# Patient Record
Sex: Male | Born: 1960 | Race: White | Hispanic: No | Marital: Married | Smoking: Never smoker
Health system: Southern US, Community
[De-identification: ages and names within clinical notes are randomized; demographics above are authoritative.]

## PROBLEM LIST (undated history)

## (undated) DIAGNOSIS — I10 Essential (primary) hypertension: Secondary | ICD-10-CM

## (undated) DIAGNOSIS — D649 Anemia, unspecified: Secondary | ICD-10-CM

## (undated) DIAGNOSIS — F418 Other specified anxiety disorders: Secondary | ICD-10-CM

## (undated) DIAGNOSIS — N200 Calculus of kidney: Secondary | ICD-10-CM

## (undated) DIAGNOSIS — R7989 Other specified abnormal findings of blood chemistry: Secondary | ICD-10-CM

## (undated) DIAGNOSIS — Z87442 Personal history of urinary calculi: Secondary | ICD-10-CM

## (undated) HISTORY — PX: LITHOTRIPSY: SUR834

## (undated) HISTORY — DX: Calculus of kidney: N20.0

## (undated) HISTORY — DX: Essential (primary) hypertension: I10

## (undated) HISTORY — PX: EYE SURGERY: SHX253

## (undated) HISTORY — PX: OTHER SURGICAL HISTORY: SHX169

## (undated) HISTORY — DX: Other specified abnormal findings of blood chemistry: R79.89

## (undated) HISTORY — DX: Other specified anxiety disorders: F41.8

---
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
## 2008-07-19 ENCOUNTER — Ambulatory Visit: Payer: Self-pay | Admitting: Cardiology

## 2010-08-20 ENCOUNTER — Ambulatory Visit (HOSPITAL_BASED_OUTPATIENT_CLINIC_OR_DEPARTMENT_OTHER): Admission: RE | Admit: 2010-08-20 | Discharge: 2010-08-20 | Payer: Self-pay | Admitting: Urology

## 2010-12-18 LAB — POCT I-STAT 4, (NA,K, GLUC, HGB,HCT)
HCT: 43 % (ref 39.0–52.0)
Hemoglobin: 14.6 g/dL (ref 13.0–17.0)
Potassium: 4 mEq/L (ref 3.5–5.1)
Sodium: 140 mEq/L (ref 135–145)

## 2013-01-21 ENCOUNTER — Other Ambulatory Visit: Payer: Self-pay

## 2013-01-21 MED ORDER — AMLODIPINE BESY-BENAZEPRIL HCL 5-20 MG PO CAPS: 1.0000 | ORAL_CAPSULE | Freq: Every day | ORAL | Status: DC

## 2013-01-25 ENCOUNTER — Encounter: Payer: Self-pay | Admitting: *Deleted

## 2013-02-01 ENCOUNTER — Encounter: Payer: Self-pay | Admitting: Family Medicine

## 2013-02-01 ENCOUNTER — Ambulatory Visit (INDEPENDENT_AMBULATORY_CARE_PROVIDER_SITE_OTHER): Payer: 59 | Admitting: Family Medicine

## 2013-02-01 VITALS — BP 139/86 | HR 96 | Temp 98.5°F | Ht 70.0 in | Wt 178.5 lb

## 2013-02-01 DIAGNOSIS — R3589 Other polyuria: Secondary | ICD-10-CM | POA: Insufficient documentation

## 2013-02-01 DIAGNOSIS — N529 Male erectile dysfunction, unspecified: Secondary | ICD-10-CM

## 2013-02-01 DIAGNOSIS — E785 Hyperlipidemia, unspecified: Secondary | ICD-10-CM | POA: Insufficient documentation

## 2013-02-01 DIAGNOSIS — I10 Essential (primary) hypertension: Secondary | ICD-10-CM | POA: Insufficient documentation

## 2013-02-01 DIAGNOSIS — N200 Calculus of kidney: Secondary | ICD-10-CM | POA: Insufficient documentation

## 2013-02-01 DIAGNOSIS — R739 Hyperglycemia, unspecified: Secondary | ICD-10-CM | POA: Insufficient documentation

## 2013-02-01 DIAGNOSIS — E559 Vitamin D deficiency, unspecified: Secondary | ICD-10-CM

## 2013-02-01 DIAGNOSIS — R358 Other polyuria: Secondary | ICD-10-CM | POA: Insufficient documentation

## 2013-02-01 DIAGNOSIS — R7309 Other abnormal glucose: Secondary | ICD-10-CM

## 2013-02-01 LAB — POCT URINALYSIS DIPSTICK
Bilirubin, UA: NEGATIVE
Glucose, UA: NEGATIVE
Ketones, UA: NEGATIVE
Leukocytes, UA: NEGATIVE
Nitrite, UA: NEGATIVE
Protein, UA: NEGATIVE
Spec Grav, UA: 1.02
Urobilinogen, UA: NEGATIVE
pH, UA: 7

## 2013-02-01 LAB — TSH: TSH: 1.142 u[IU]/mL (ref 0.350–4.500)

## 2013-02-01 LAB — COMPLETE METABOLIC PANEL WITH GFR
ALT: 13 U/L (ref 0–53)
AST: 12 U/L (ref 0–37)
Albumin: 4.6 g/dL (ref 3.5–5.2)
Alkaline Phosphatase: 79 U/L (ref 39–117)
BUN: 17 mg/dL (ref 6–23)
CO2: 30 mEq/L (ref 19–32)
Calcium: 9.9 mg/dL (ref 8.4–10.5)
Chloride: 102 mEq/L (ref 96–112)
Creat: 0.88 mg/dL (ref 0.50–1.35)
GFR, Est African American: >89 mL/min
GFR, Est Non African American: >89 mL/min
Glucose, Bld: 108 mg/dL — ABNORMAL HIGH (ref 70–99)
Potassium: 4.3 mEq/L (ref 3.5–5.3)
Sodium: 141 mEq/L (ref 135–145)
Total Bilirubin: 0.4 mg/dL (ref 0.3–1.2)
Total Protein: 7.6 g/dL (ref 6.0–8.3)

## 2013-02-01 LAB — POCT UA - MICROSCOPIC ONLY
Bacteria, U Microscopic: NEGATIVE
Crystals, Ur, HPF, POC: NEGATIVE
Epithelial cells, urine per micros: NEGATIVE
WBC, Ur, HPF, POC: NEGATIVE
Yeast, UA: NEGATIVE

## 2013-02-01 LAB — PROLACTIN: Prolactin: 5.4 ng/mL (ref 2.1–17.1)

## 2013-02-01 LAB — POCT GLYCOSYLATED HEMOGLOBIN (HGB A1C): Hemoglobin A1C: 5.1

## 2013-02-01 MED ORDER — AMLODIPINE BESY-BENAZEPRIL HCL 5-40 MG PO CAPS: 1.0000 | ORAL_CAPSULE | Freq: Every day | ORAL | Status: DC

## 2013-02-01 MED ORDER — SILDENAFIL CITRATE 50 MG PO TABS: 50.0000 mg | ORAL_TABLET | Freq: Every day | ORAL | Status: DC | PRN

## 2013-02-01 NOTE — Progress Notes (Signed)
Patient ID: [name], male   DOB: Feb 25, 1961, 52 y.o.   MRN: [id number] SUBJECTIVE: HPI: 52 year old male hear for follow up. Has weakness of the stream to urinate.  Patient is here for follow up of hypertension: denies Headache;deniesChest Pain;denies weakness;denies Shortness of Breath or Orthopnea;denies Visual changes;denies palpitations;admits to cough;denies pedal edema;denies symptoms of TIA or stroke; admits to Compliance with medications. denies Problems with medications.  Also, having problems with erection.  Past Medical History  Diagnosis Date  . Hypertension   . Kidney stones    No past surgical history on file. Current Outpatient Prescriptions on File Prior to Visit  Medication Sig Dispense Refill  . amLODipine-benazepril (LOTREL) 5-20 MG per capsule Take 1 capsule by mouth daily.  30 capsule  1   No current facility-administered medications on file prior to visit.   No Known Allergies Immunization History  Administered Date(s) Administered  . Tdap 04/12/2010      PMH/PSH: reviewed/updated in Epic  SH/FH: reviewed/updated in Epic  Allergies: reviewed/updated in Epic  Medications: reviewed/updated in Epic  Immunizations: reviewed/updated in Epic  ROS: As above in the HPI. All other systems are stable or negative.  OBJECTIVE: APPEARANCE:  Slim healthy appearing White male.  Patient in no acute distress.The patient appeared well nourished and normally developed. Acyanotic. Waist: VITAL SIGNS:BP 139/86  Pulse 96  Temp(Src) 98.5 F (36.9 C) (Oral)  Ht 5\' 10"  (1.778 m)  Wt 178 lb 8 oz (80.967 kg)  BMI 25.61 kg/m2   SKIN: warm and  Dry without overt rashes, tattoos and scars  HEAD and Neck: without JVD, Head and scalp: normal Eyes:No scleral icterus. Fundi normal, eye movements normal. Ears: Auricle normal, canal normal, Tympanic membranes normal, insufflation normal. Nose: normal Throat: normal Neck & thyroid: normal  CHEST &  LUNGS: Chest wall: normal Lungs: Clear  CVS: Reveals the PMI to be normally located. Regular rhythm, First and Second Heart sounds are normal,  absence of murmurs, rubs or gallops. Peripheral vasculature: Radial pulses: normal Dorsal pedis pulses: normal Posterior pulses: normal  ABDOMEN:  Appearance: normal Benign,, no organomegaly, no masses, no Abdominal Aortic enlargement. No Guarding , no rebound. No Bruits. Bowel sounds: normal  RECTAL: N/A GU: N/A  EXTREMETIES: nonedematous. Both Femoral and Pedal pulses are normal.  MUSCULOSKELETAL:  Spine: normal Joints: intact  NEUROLOGIC: oriented to time,place and person; nonfocal. Strength is normal Sensory is normal Reflexes are normal Cranial Nerves are normal.  ASSESSMENT: HTN (hypertension) - Plan: COMPLETE METABOLIC PANEL WITH GFR, amLODipine-benazepril (LOTREL) 5-40 MG per capsule  Kidney stones - Plan: COMPLETE METABOLIC PANEL WITH GFR  Erectile dysfunction - Plan: COMPLETE METABOLIC PANEL WITH GFR, Testosterone, Total & Free Direct, Prolactin, TSH, sildenafil (VIAGRA) 50 MG tablet  Hyperglycemia - Plan: COMPLETE METABOLIC PANEL WITH GFR, POCT urinalysis dipstick, POCT glycosylated hemoglobin (Hb A1C)  Dyslipidemia - Plan: COMPLETE METABOLIC PANEL WITH GFR, NMR Lipoprofile with Lipids  Unspecified vitamin D deficiency - Plan: Vitamin D 25 hydroxy  Polyuria - Plan: POCT urinalysis dipstick, Urine culture, POCT glycosylated hemoglobin (Hb A1C)    PLAN: Orders Placed This Encounter  Procedures  . Urine culture  . COMPLETE METABOLIC PANEL WITH GFR  . Vitamin D 25 hydroxy  . NMR Lipoprofile with Lipids  . Testosterone, Total & Free Direct  . Prolactin  . TSH  . POCT urinalysis dipstick  . POCT glycosylated hemoglobin (Hb A1C)   No results found for this or any previous visit (from the past 24 hour(s)). Meds  ordered this encounter  Medications  . amLODipine-benazepril (LOTREL) 5-40 MG per capsule     Sig: Take 1 capsule by mouth daily.    Dispense:  30 capsule    Refill:  5  . sildenafil (VIAGRA) 50 MG tablet    Sig: Take 1 tablet (50 mg total) by mouth daily as needed for erectile dysfunction.    Dispense:  10 tablet    Refill:  5   Adjusted his BP meds to bring under better control.  Discussed his GU symptoms, which is more ED than polyuria.  RTc 6 weeks.  Dietary changes recommended.  Syvanna Ciolino P. Modesto Charon, M.D.

## 2013-02-01 NOTE — Addendum Note (Signed)
Addended by: Roselyn Reef on: 02/01/2013 09:39 AM   Modules accepted: Orders

## 2013-02-02 ENCOUNTER — Ambulatory Visit: Payer: Self-pay | Admitting: Family Medicine

## 2013-02-02 LAB — NMR LIPOPROFILE WITH LIPIDS
Cholesterol, Total: 167 mg/dL (ref <200)
HDL Particle Number: 32.3 umol/L (ref >=30.5)
HDL Size: 8.5 nm — ABNORMAL LOW (ref >=9.2)
HDL-C: 47 mg/dL (ref >=40)
LDL (calc): 78 mg/dL (ref <100)
LDL Particle Number: 1415 nmol/L — ABNORMAL HIGH (ref <1000)
LDL Size: 20.6 nm (ref >20.5)
LP-IR Score: 76 — ABNORMAL HIGH (ref <=45)
Large HDL-P: 2.2 umol/L — ABNORMAL LOW (ref >=4.8)
Large VLDL-P: 7.7 nmol/L — ABNORMAL HIGH (ref <=2.7)
Small LDL Particle Number: 904 nmol/L — ABNORMAL HIGH (ref <=527)
Triglycerides: 208 mg/dL — ABNORMAL HIGH (ref <150)
VLDL Size: 50.3 nm — ABNORMAL HIGH (ref <=46.6)

## 2013-02-02 LAB — URINE CULTURE
Colony Count: NO GROWTH
Organism ID, Bacteria: NO GROWTH

## 2013-02-02 LAB — VITAMIN D 25 HYDROXY (VIT D DEFICIENCY, FRACTURES): Vit D, 25-Hydroxy: 26 ng/mL — ABNORMAL LOW (ref 30–89)

## 2013-02-03 LAB — TESTOSTERONE, TOTAL AND FREE DIRECT MEASURE
Free Testosterone, Direct: 7.1 pg/mL (ref 3.8–34.2)
Testosterone: 228 ng/dL — ABNORMAL LOW (ref 300–890)

## 2013-02-09 ENCOUNTER — Telehealth: Payer: Self-pay | Admitting: Family Medicine

## 2013-02-09 NOTE — Telephone Encounter (Signed)
Left mess on pt VM cell phone that rx has been called to [facility]

## 2013-03-16 ENCOUNTER — Ambulatory Visit: Payer: 59 | Admitting: Family Medicine

## 2013-06-01 ENCOUNTER — Ambulatory Visit: Payer: 59 | Admitting: Family Medicine

## 2013-07-20 ENCOUNTER — Ambulatory Visit: Payer: 59 | Admitting: Family Medicine

## 2013-07-22 ENCOUNTER — Other Ambulatory Visit: Payer: Self-pay | Admitting: *Deleted

## 2013-07-22 DIAGNOSIS — I10 Essential (primary) hypertension: Secondary | ICD-10-CM

## 2013-07-22 MED ORDER — AMLODIPINE BESY-BENAZEPRIL HCL 5-40 MG PO CAPS: 1.0000 | ORAL_CAPSULE | Freq: Every day | ORAL | Status: DC

## 2013-07-22 NOTE — Telephone Encounter (Signed)
Patient needs to be seen. Has exceeded time since last visit. Limited quantity refilled. Needs to bring all medications to next appointment.   

## 2013-07-22 NOTE — Telephone Encounter (Signed)
LAST OV 02/01/13.

## 2013-07-26 ENCOUNTER — Other Ambulatory Visit: Payer: Self-pay

## 2013-07-26 DIAGNOSIS — I10 Essential (primary) hypertension: Secondary | ICD-10-CM

## 2013-07-26 MED ORDER — AMLODIPINE BESY-BENAZEPRIL HCL 5-40 MG PO CAPS: 1.0000 | ORAL_CAPSULE | Freq: Every day | ORAL | Status: DC

## 2013-08-30 ENCOUNTER — Encounter: Payer: Self-pay | Admitting: *Deleted

## 2013-09-16 ENCOUNTER — Ambulatory Visit: Payer: 59 | Admitting: Family Medicine

## 2013-09-20 ENCOUNTER — Encounter: Payer: Self-pay | Admitting: Family Medicine

## 2013-09-20 ENCOUNTER — Ambulatory Visit (INDEPENDENT_AMBULATORY_CARE_PROVIDER_SITE_OTHER): Payer: 59

## 2013-09-20 ENCOUNTER — Ambulatory Visit (INDEPENDENT_AMBULATORY_CARE_PROVIDER_SITE_OTHER): Payer: 59 | Admitting: Family Medicine

## 2013-09-20 VITALS — BP 130/87 | HR 109 | Temp 97.6°F | Ht 70.0 in | Wt 173.0 lb

## 2013-09-20 DIAGNOSIS — E291 Testicular hypofunction: Secondary | ICD-10-CM

## 2013-09-20 DIAGNOSIS — M25412 Effusion, left shoulder: Secondary | ICD-10-CM

## 2013-09-20 DIAGNOSIS — E785 Hyperlipidemia, unspecified: Secondary | ICD-10-CM

## 2013-09-20 DIAGNOSIS — R358 Other polyuria: Secondary | ICD-10-CM

## 2013-09-20 DIAGNOSIS — E559 Vitamin D deficiency, unspecified: Secondary | ICD-10-CM

## 2013-09-20 DIAGNOSIS — M25419 Effusion, unspecified shoulder: Secondary | ICD-10-CM

## 2013-09-20 DIAGNOSIS — N200 Calculus of kidney: Secondary | ICD-10-CM

## 2013-09-20 DIAGNOSIS — R350 Frequency of micturition: Secondary | ICD-10-CM

## 2013-09-20 DIAGNOSIS — N529 Male erectile dysfunction, unspecified: Secondary | ICD-10-CM

## 2013-09-20 DIAGNOSIS — I10 Essential (primary) hypertension: Secondary | ICD-10-CM

## 2013-09-20 DIAGNOSIS — R7309 Other abnormal glucose: Secondary | ICD-10-CM

## 2013-09-20 DIAGNOSIS — R739 Hyperglycemia, unspecified: Secondary | ICD-10-CM

## 2013-09-20 DIAGNOSIS — R7989 Other specified abnormal findings of blood chemistry: Secondary | ICD-10-CM | POA: Insufficient documentation

## 2013-09-20 DIAGNOSIS — R3589 Other polyuria: Secondary | ICD-10-CM

## 2013-09-20 LAB — POCT URINALYSIS DIPSTICK
Bilirubin, UA: NEGATIVE
Glucose, UA: NEGATIVE
Ketones, UA: NEGATIVE
Leukocytes, UA: NEGATIVE
Nitrite, UA: NEGATIVE
Protein, UA: NEGATIVE
Spec Grav, UA: >=1.03
Urobilinogen, UA: NEGATIVE
pH, UA: 5

## 2013-09-20 LAB — POCT UA - MICROSCOPIC ONLY
Casts, Ur, LPF, POC: NEGATIVE
Crystals, Ur, HPF, POC: NEGATIVE
Mucus, UA: NEGATIVE
RBC, urine, microscopic: 3.5
Yeast, UA: NEGATIVE

## 2013-09-20 MED ORDER — AMLODIPINE BESY-BENAZEPRIL HCL 5-40 MG PO CAPS: 1.0000 | ORAL_CAPSULE | Freq: Every day | ORAL | Status: DC

## 2013-09-20 NOTE — Progress Notes (Signed)
Patient ID: [name], male   DOB: 03-14-1961, 52 y.o.   MRN: [id number] SUBJECTIVE: CC: Chief Complaint  Patient presents with  . Follow-up    follow up on chronic medical condition.  . Urinary Frequency    urinary frequency. Hx of kidney stones. Denies dysuria or hematuria.   . Medication Refill    Lotrel    HPI: Lower tract symptoms of  Frequency, weaker urinary stream,. No pain no straining.  Patient is here for follow up of hypertension/Hyperlipidemia: denies Headache;deniesChest Pain;denies weakness;denies Shortness of Breath or Orthopnea;denies Visual changes;denies palpitations;denies cough;denies pedal edema;denies symptoms of TIA or stroke; admits to Compliance with medications. denies Problems with medications.  Vitamin D Def  Low Testosterone.  Has h/o kidney stones.  Has concern for prostate cancer since his dad had this in his older age.   Past Medical History  Diagnosis Date  . Hypertension   . Kidney stones   . Low testosterone    History reviewed. No pertinent past surgical history. History   Social History  . Marital Status: Married    Spouse Name: N/A    Number of Children: N/A  . Years of Education: N/A   Occupational History  . Not on file.   Social History Main Topics  . Smoking status: Never Smoker   . Smokeless tobacco: Not on file  . Alcohol Use: No  . Drug Use: No  . Sexual Activity: Yes   Other Topics Concern  . Not on file   Social History Narrative  . No narrative on file   Family History  Problem Relation Age of Onset  . Hypertension Mother   . Cancer Father     prostate  . Hypertension Father    Current Outpatient Prescriptions on File Prior to Visit  Medication Sig Dispense Refill  . sildenafil (VIAGRA) 50 MG tablet Take 1 tablet (50 mg total) by mouth daily as needed for erectile dysfunction.  10 tablet  5   No current facility-administered medications on file prior to visit.   No Known  Allergies Immunization History  Administered Date(s) Administered  . Tdap 04/12/2010   Prior to Admission medications   Medication Sig Start Date End Date Taking? Authorizing Provider  amLODipine-benazepril (LOTREL) 5-40 MG per capsule Take 1 capsule by mouth daily. 07/26/13   Ernestina Penna, MD  sildenafil (VIAGRA) 50 MG tablet Take 1 tablet (50 mg total) by mouth daily as needed for erectile dysfunction. 02/01/13   Ileana Ladd, MD     ROS: As above in the HPI. All other systems are stable or negative.  OBJECTIVE: APPEARANCE:  Patient in no acute distress.The patient appeared well nourished and normally developed. Acyanotic. Waist: VITAL SIGNS:BP 130/87  Pulse 109  Temp(Src) 97.6 F (36.4 C) (Oral)  Ht 5\' 10"  (1.778 m)  Wt 173 lb (78.472 kg)  BMI 24.82 kg/m2  WM SKIN: warm and  Dry without overt rashes, tattoos and scars  HEAD and Neck: without JVD, Head and scalp: normal Eyes:No scleral icterus. Fundi normal, eye movements normal. Ears: Auricle normal, canal normal, Tympanic membranes normal, insufflation normal. Nose: normal Throat: normal Neck & thyroid: normal  CHEST & LUNGS: Chest wall: normal Lungs: Clear  CVS: Reveals the PMI to be normally located. Regular rhythm, First and Second Heart sounds are normal,  absence of murmurs, rubs or gallops. Peripheral vasculature: Radial pulses: normal Dorsal pedis pulses: normal Posterior pulses: normal  ABDOMEN:  Appearance: normal Benign, no organomegaly, no masses, no  Abdominal Aortic enlargement. No Guarding , no rebound. No Bruits. Bowel sounds: normal  RECTAL: prostate moderate enlargement, smooth, hemenegative brown stools  GU: N/A  EXTREMETIES: nonedematous.  MUSCULOSKELETAL:  Spine: normal Joints: intact  NEUROLOGIC: oriented to time,place and person; nonfocal. Strength is normal Sensory is normal Reflexes are normal Cranial Nerves are normal.   Results for orders placed in visit on  09/20/13  POCT UA - MICROSCOPIC ONLY      Result Value Range   WBC, Ur, HPF, POC 1-3     RBC, urine, microscopic 3.5     Bacteria, U Microscopic occ     Mucus, UA negative     Epithelial cells, urine per micros occ     Crystals, Ur, HPF, POC negative     Casts, Ur, LPF, POC negative     Yeast, UA negative    POCT URINALYSIS DIPSTICK      Result Value Range   Color, UA gold     Clarity, UA clear     Glucose, UA negative     Bilirubin, UA negative     Ketones, UA negative     Spec Grav, UA >=1.030     Blood, UA trace     pH, UA 5.0     Protein, UA negative     Urobilinogen, UA negative     Nitrite, UA negative     Leukocytes, UA Negative      ASSESSMENT: Urinary frequency - Plan: POCT UA - Microscopic Only, POCT urinalysis dipstick, Urine culture  HTN (hypertension) - Plan: CMP14+EGFR, amLODipine-benazepril (LOTREL) 5-40 MG per capsule  Kidney stones  Unspecified vitamin D deficiency - Plan: Vit D  25 hydroxy (rtn osteoporosis monitoring)  Erectile dysfunction  Polyuria  Hyperglycemia  Dyslipidemia - Plan: CMP14+EGFR, NMR, lipoprofile  Low testosterone - Plan: Testosterone,Free and Total  Swelling of joint of left shoulder - Plan: DG Shoulder Left  PLAN: Increased fkuids DASH diet       Dr Woodroe Mode Recommendations  For nutrition information, I recommend books:  1).Eat to Live by Dr Monico Hoar. 2).Prevent and Reverse Heart Disease by Dr Suzzette Righter. 3) Dr Katherina Right Book:  Program to Reverse Diabetes  Exercise recommendations are:  If unable to walk, then the patient can exercise in a chair 3 times a day. By flapping arms like a bird gently and raising legs outwards to the front.  If ambulatory, the patient can go for walks for 30 minutes 3 times a week. Then increase the intensity and duration as tolerated.  Goal is to try to attain exercise frequency to 5 times a week.  If applicable: Best to perform resistance exercises (machines  or weights) 2 days a week and cardio type exercises 3 days per week.  Orders Placed This Encounter  Procedures  . Urine culture  . DG Shoulder Left    Standing Status: Future     Number of Occurrences: 1     Standing Expiration Date: 11/21/2014    Order Specific Question:  Reason for Exam (SYMPTOM  OR DIAGNOSIS REQUIRED)    Answer:  bump on th eleft shoulder    Order Specific Question:  Preferred imaging location?    Answer:  Internal  . CMP14+EGFR  . NMR, lipoprofile  . Testosterone,Free and Total  . Vit D  25 hydroxy (rtn osteoporosis monitoring)  . POCT UA - Microscopic Only  . POCT urinalysis dipstick   Meds ordered this encounter  Medications  . amLODipine-benazepril (LOTREL) 5-40  MG per capsule    Sig: Take 1 capsule by mouth daily.    Dispense:  90 capsule    Refill:  3   Medications Discontinued During This Encounter  Medication Reason  . amLODipine-benazepril (LOTREL) 5-40 MG per capsule Reorder    [facility] reading (PRIMARY) by  Dr. Modesto Charon: prominent distal left clavicle. Degenerative changes the left AC joint.                             Plan to draw PSA next week.  increase fluids  Results for orders placed in visit on 09/20/13  POCT UA - MICROSCOPIC ONLY      Result Value Range   WBC, Ur, HPF, POC 1-3     RBC, urine, microscopic 3.5     Bacteria, U Microscopic occ     Mucus, UA negative     Epithelial cells, urine per micros occ     Crystals, Ur, HPF, POC negative     Casts, Ur, LPF, POC negative     Yeast, UA negative    POCT URINALYSIS DIPSTICK      Result Value Range   Color, UA gold     Clarity, UA clear     Glucose, UA negative     Bilirubin, UA negative     Ketones, UA negative     Spec Grav, UA >=1.030     Blood, UA trace     pH, UA 5.0     Protein, UA negative     Urobilinogen, UA negative     Nitrite, UA negative     Leukocytes, UA Negative      Return in about 1 week (around 09/27/2013) for Recheck medical problems and urine.  Tamaj Jurgens P.  Modesto Charon, M.D.

## 2013-09-20 NOTE — Patient Instructions (Signed)
DASH Diet The DASH diet stands for "Dietary Approaches to Stop Hypertension." It is a healthy eating plan that has been shown to reduce high blood pressure (hypertension) in as little as 14 days, while also possibly providing other significant health benefits. These other health benefits include reducing the risk of breast cancer after menopause and reducing the risk of type 2 diabetes, heart disease, colon cancer, and stroke. Health benefits also include weight loss and slowing kidney failure in patients with chronic kidney disease.  DIET GUIDELINES  Limit salt (sodium). Your diet should contain less than 1500 mg of sodium daily.  Limit refined or processed carbohydrates. Your diet should include mostly whole grains. Desserts and added sugars should be used sparingly.  Include small amounts of heart-healthy fats. These types of fats include nuts, oils, and tub margarine. Limit saturated and trans fats. These fats have been shown to be harmful in the body. CHOOSING FOODS  The following food groups are based on a 2000 calorie diet. See your Registered Dietitian for individual calorie needs. Grains and Grain Products (6 to 8 servings daily)  Eat More Often: Whole-wheat bread, brown rice, whole-grain or wheat pasta, quinoa, popcorn without added fat or salt (air popped).  Eat Less Often: White bread, white pasta, white rice, cornbread. Vegetables (4 to 5 servings daily)  Eat More Often: Fresh, frozen, and canned vegetables. Vegetables may be raw, steamed, roasted, or grilled with a minimal amount of fat.  Eat Less Often/Avoid: Creamed or fried vegetables. Vegetables in a cheese sauce. Fruit (4 to 5 servings daily)  Eat More Often: All fresh, canned (in natural juice), or frozen fruits. Dried fruits without added sugar. One hundred percent fruit juice ( cup [237 mL] daily).  Eat Less Often: Dried fruits with added sugar. Canned fruit in light or heavy syrup. Lean Meats, Fish, and Poultry (2  servings or less daily. One serving is 3 to 4 oz [85-114 g]).  Eat More Often: Ninety percent or leaner ground beef, tenderloin, sirloin. Round cuts of beef, chicken breast, turkey breast. All fish. Grill, bake, or broil your meat. Nothing should be fried.  Eat Less Often/Avoid: Fatty cuts of meat, turkey, or chicken leg, thigh, or wing. Fried cuts of meat or fish. Dairy (2 to 3 servings)  Eat More Often: Low-fat or fat-free milk, low-fat plain or light yogurt, reduced-fat or part-skim cheese.  Eat Less Often/Avoid: Milk (whole, 2%).Whole milk yogurt. Full-fat cheeses. Nuts, Seeds, and Legumes (4 to 5 servings per week)  Eat More Often: All without added salt.  Eat Less Often/Avoid: Salted nuts and seeds, canned beans with added salt. Fats and Sweets (limited)  Eat More Often: Vegetable oils, tub margarines without trans fats, sugar-free gelatin. Mayonnaise and salad dressings.  Eat Less Often/Avoid: Coconut oils, palm oils, butter, stick margarine, cream, half and half, cookies, candy, pie. FOR MORE INFORMATION The Dash Diet Eating Plan: www.dashdiet.org Document Released: 09/12/2011 Document Revised: 12/16/2011 Document Reviewed: 09/12/2011 [employer] Patient Information 2014 [employer].        Dr Talishia Betzler's Recommendations  For nutrition information, I recommend books:  1).Eat to Live by Dr Joel Fuhrman. 2).Prevent and Reverse Heart Disease by Dr Caldwell Esselstyn. 3) Dr Neal Barnard's Book:  Program to Reverse Diabetes  Exercise recommendations are:  If unable to walk, then the patient can exercise in a chair 3 times a day. By flapping arms like a bird gently and raising legs outwards to the front.  If ambulatory, the patient can go for   walks for 30 minutes 3 times a week. Then increase the intensity and duration as tolerated.  Goal is to try to attain exercise frequency to 5 times a week.  If applicable: Best to perform resistance exercises (machines or  weights) 2 days a week and cardio type exercises 3 days per week.  

## 2013-09-22 LAB — CMP14+EGFR
ALT: 14 IU/L (ref 0–44)
AST: 17 IU/L (ref 0–40)
Albumin/Globulin Ratio: 1.7 (ref 1.1–2.5)
Albumin: 4.8 g/dL (ref 3.5–5.5)
Alkaline Phosphatase: 77 IU/L (ref 39–117)
BUN/Creatinine Ratio: 15 (ref 9–20)
BUN: 17 mg/dL (ref 6–24)
CO2: 28 mmol/L (ref 18–29)
Calcium: 10.1 mg/dL (ref 8.7–10.2)
Chloride: 98 mmol/L (ref 97–108)
Creatinine, Ser: 1.11 mg/dL (ref 0.76–1.27)
GFR calc Af Amer: 87 mL/min/{1.73_m2} (ref >59)
GFR calc non Af Amer: 75 mL/min/{1.73_m2} (ref >59)
Globulin, Total: 2.9 g/dL (ref 1.5–4.5)
Glucose: 99 mg/dL (ref 65–99)
Potassium: 4.2 mmol/L (ref 3.5–5.2)
Sodium: 140 mmol/L (ref 134–144)
Total Bilirubin: 0.3 mg/dL (ref 0.0–1.2)
Total Protein: 7.7 g/dL (ref 6.0–8.5)

## 2013-09-22 LAB — TESTOSTERONE,FREE AND TOTAL
Testosterone, Free: 6.9 pg/mL — ABNORMAL LOW (ref 7.2–24.0)
Testosterone: 216 ng/dL — ABNORMAL LOW (ref 348–1197)

## 2013-09-22 LAB — NMR, LIPOPROFILE
Cholesterol: 204 mg/dL — ABNORMAL HIGH (ref <200)
HDL Cholesterol by NMR: 54 mg/dL (ref >=40)
HDL Particle Number: 36.8 umol/L (ref >=30.5)
LDL Particle Number: 1579 nmol/L — ABNORMAL HIGH (ref <1000)
LDL Size: 19.8 nm — ABNORMAL LOW (ref >20.5)
LDLC SERPL CALC-MCNC: 109 mg/dL — ABNORMAL HIGH (ref <100)
LP-IR Score: 69 — ABNORMAL HIGH (ref <=45)
Small LDL Particle Number: 1232 nmol/L — ABNORMAL HIGH (ref <=527)
Triglycerides by NMR: 205 mg/dL — ABNORMAL HIGH (ref <150)

## 2013-09-22 LAB — URINE CULTURE

## 2013-09-22 LAB — VITAMIN D 25 HYDROXY (VIT D DEFICIENCY, FRACTURES): Vit D, 25-Hydroxy: 16.8 ng/mL — ABNORMAL LOW (ref 30.0–100.0)

## 2013-09-26 NOTE — Progress Notes (Signed)
Quick Note:  Call Patient Labs that are abnormal: Testosterone is lower Vit d is low LDLc is not optimal but close to goal The triglycerides is high  The rest are at goal  Recommendations: Avoid simple carbs and sugars. Start on Vitamin D 2000IU daily Start on omega-3 fish oil 2 gm twice a day to reduce the triglycerides Make an appointment to discuss with me the risks of testosterone treatment and to do a Baseline PSA Exercise 5x a week will help bring up the testosterone  ______

## 2013-09-28 ENCOUNTER — Ambulatory Visit (INDEPENDENT_AMBULATORY_CARE_PROVIDER_SITE_OTHER): Payer: 59 | Admitting: Family Medicine

## 2013-09-28 ENCOUNTER — Encounter: Payer: Self-pay | Admitting: Family Medicine

## 2013-09-28 VITALS — BP 128/85 | HR 78 | Temp 97.5°F | Ht 70.0 in | Wt 177.8 lb

## 2013-09-28 DIAGNOSIS — E291 Testicular hypofunction: Secondary | ICD-10-CM

## 2013-09-28 DIAGNOSIS — R3989 Other symptoms and signs involving the genitourinary system: Secondary | ICD-10-CM

## 2013-09-28 DIAGNOSIS — R7989 Other specified abnormal findings of blood chemistry: Secondary | ICD-10-CM

## 2013-09-28 DIAGNOSIS — E559 Vitamin D deficiency, unspecified: Secondary | ICD-10-CM

## 2013-09-28 DIAGNOSIS — I10 Essential (primary) hypertension: Secondary | ICD-10-CM

## 2013-09-28 DIAGNOSIS — R350 Frequency of micturition: Secondary | ICD-10-CM

## 2013-09-28 DIAGNOSIS — R358 Other polyuria: Secondary | ICD-10-CM

## 2013-09-28 DIAGNOSIS — E785 Hyperlipidemia, unspecified: Secondary | ICD-10-CM

## 2013-09-28 DIAGNOSIS — R399 Unspecified symptoms and signs involving the genitourinary system: Secondary | ICD-10-CM

## 2013-09-28 DIAGNOSIS — N529 Male erectile dysfunction, unspecified: Secondary | ICD-10-CM

## 2013-09-28 DIAGNOSIS — N200 Calculus of kidney: Secondary | ICD-10-CM

## 2013-09-28 DIAGNOSIS — R3589 Other polyuria: Secondary | ICD-10-CM

## 2013-09-28 NOTE — Progress Notes (Addendum)
Patient ID: [name], male   DOB: [birth year], 52 y.o.   MRN: [id number] SUBJECTIVE: CC: Chief Complaint  Patient presents with  . Follow-up    1 week follow up     HPI: Here for follow up  Benign Prostatic Hypertrophy Patient complains of lower urinary tract symptoms. He reports frequency, incomplete emptying and weak stream. He denies nocturia one time a night and straining. Patient states symptoms are of mild severity. Onset of symptoms was 3 months ago and was gradual in onset. His AUA Symptom Score is, 3./10. manifested as irritative symptoms including frequency. He has a family history of prostate cancer. He reports a history of kidney stones. He denies no complicating symptoms  Vitamin D Def: started on Vit D 2000 units daily  Held off on omega-3 because he heard it could make his prostate enlarge.  Low T.  Past Medical History  Diagnosis Date  . Hypertension   . Kidney stones   . Low testosterone    No past surgical history on file. History   Social History  . Marital Status: Married    Spouse Name: N/A    Number of Children: N/A  . Years of Education: N/A   Occupational History  . Not on file.   Social History Main Topics  . Smoking status: Never Smoker   . Smokeless tobacco: Not on file  . Alcohol Use: No  . Drug Use: No  . Sexual Activity: Yes   Other Topics Concern  . Not on file   Social History Narrative  . No narrative on file   Family History  Problem Relation Age of Onset  . Hypertension Mother   . Cancer Father     prostate  . Hypertension Father    Current Outpatient Prescriptions on File Prior to Visit  Medication Sig Dispense Refill  . amLODipine-benazepril (LOTREL) 5-40 MG per capsule Take 1 capsule by mouth daily.  90 capsule  3  . sildenafil (VIAGRA) 50 MG tablet Take 1 tablet (50 mg total) by mouth daily as needed for erectile dysfunction.  10 tablet  5   No current facility-administered medications on file prior to visit.    No Known Allergies Immunization History  Administered Date(s) Administered  . Tdap 04/12/2010   Prior to Admission medications   Medication Sig Start Date End Date Taking? Authorizing Provider  amLODipine-benazepril (LOTREL) 5-40 MG per capsule Take 1 capsule by mouth daily. 09/20/13   Ileana Ladd, MD  sildenafil (VIAGRA) 50 MG tablet Take 1 tablet (50 mg total) by mouth daily as needed for erectile dysfunction. 02/01/13   Ileana Ladd, MD     ROS: As above in the HPI. All other systems are stable or negative.  OBJECTIVE: APPEARANCE:  Patient in no acute distress.The patient appeared well nourished and normally developed. Acyanotic. Waist: VITAL SIGNS:BP 128/85  Pulse 78  Temp(Src) 97.5 F (36.4 C) (Oral)  Ht 5\' 10"  (1.778 m)  Wt 177 lb 12.8 oz (80.65 kg)  BMI 25.51 kg/m2   SKIN: warm and  Dry without overt rashes, tattoos and scars  HEAD and Neck: without JVD, Head and scalp: normal Eyes:No scleral icterus. Fundi normal, eye movements normal. Ears: Auricle normal, canal normal, Tympanic membranes normal, insufflation normal. Nose: normal Throat: normal Neck & thyroid: normal  CHEST & LUNGS: Chest wall: normal Lungs: Clear  CVS: Reveals the PMI to be normally located. Regular rhythm, First and Second Heart sounds are normal,  absence of murmurs, rubs  or gallops. Peripheral vasculature: Radial pulses: normal Dorsal pedis pulses: normal Posterior pulses: normal  ABDOMEN:  Appearance: normal Benign, no organomegaly, no masses, no Abdominal Aortic enlargement. No Guarding , no rebound. No Bruits. Bowel sounds: normal  RECTAL: N/A GU: N/A  EXTREMETIES: nonedematous.  MUSCULOSKELETAL:  Spine: normal Joints: intact  NEUROLOGIC: oriented to time,place and person; nonfocal. Strength is normal Sensory is normal Reflexes are normal Cranial Nerves are normal.  ASSESSMENT: Dyslipidemia  HTN (hypertension)  Polyuria  Urinary  frequency  Erectile dysfunction  Kidney stones  Low testosterone  Unspecified vitamin D deficiency  Lower urinary tract symptoms - Plan: PSA, total and free, Ambulatory referral to Urology  PLAN:  Orders Placed This Encounter  Procedures  . PSA, total and free  . Ambulatory referral to Urology    Referral Priority:  Routine    Referral Type:  Consultation    Referral Reason:  Specialty Services Required    Requested Specialty:  Urology    Number of Visits Requested:  1  reviewed labs with patient  Results for orders placed in visit on 09/20/13  URINE CULTURE      Result Value Range   Urine Culture, Routine Final report     Result 1 Comment    CMP14+EGFR      Result Value Range   Glucose 99  65 - 99 mg/dL   BUN 17  6 - 24 mg/dL   Creatinine, Ser 1.61  0.76 - 1.27 mg/dL   GFR calc non Af Amer 75  >59 mL/min/1.73   GFR calc Af Amer 87  >59 mL/min/1.73   BUN/Creatinine Ratio 15  9 - 20   Sodium 140  134 - 144 mmol/L   Potassium 4.2  3.5 - 5.2 mmol/L   Chloride 98  97 - 108 mmol/L   CO2 28  18 - 29 mmol/L   Calcium 10.1  8.7 - 10.2 mg/dL   Total Protein 7.7  6.0 - 8.5 g/dL   Albumin 4.8  3.5 - 5.5 g/dL   Globulin, Total 2.9  1.5 - 4.5 g/dL   Albumin/Globulin Ratio 1.7  1.1 - 2.5   Total Bilirubin 0.3  0.0 - 1.2 mg/dL   Alkaline Phosphatase 77  39 - 117 IU/L   AST 17  0 - 40 IU/L   ALT 14  0 - 44 IU/L  NMR, LIPOPROFILE      Result Value Range   LDL Particle Number [id number] (*) <1000 nmol/L   LDLC SERPL CALC-MCNC 109 (*) <100 mg/dL   HDL Cholesterol by NMR 54  >=40 mg/dL   Triglycerides by NMR 205 (*) <150 mg/dL   Cholesterol 096 (*) <045 mg/dL   HDL Particle Number [id number]  >=81.1 umol/L   Small LDL Particle Number [id number] (*) <=527 nmol/L   LDL Size 19.8 (*) >20.5 nm   LP-IR Score 69 (*) <=45  TESTOSTERONE,FREE AND TOTAL      Result Value Range   Testosterone 216 (*) 348 - 1197 ng/dL   COMMENT Comment     Testosterone, Free 6.9 (*) 7.2 - 24.0 pg/mL  VITAMIN D 25  HYDROXY      Result Value Range   Vit D, 25-Hydroxy 16.8 (*) 30.0 - 100.0 ng/mL  POCT UA - MICROSCOPIC ONLY      Result Value Range   WBC, Ur, HPF, POC 1-3     RBC, urine, microscopic 3.5     Bacteria, U Microscopic occ     Mucus, UA negative  Epithelial cells, urine per micros occ     Crystals, Ur, HPF, POC negative     Casts, Ur, LPF, POC negative     Yeast, UA negative    POCT URINALYSIS DIPSTICK      Result Value Range   Color, UA gold     Clarity, UA clear     Glucose, UA negative     Bilirubin, UA negative     Ketones, UA negative     Spec Grav, UA >=1.030     Blood, UA trace     pH, UA 5.0     Protein, UA negative     Urobilinogen, UA negative     Nitrite, UA negative     Leukocytes, UA Negative           Dr Woodroe Mode Recommendations  For nutrition information, I recommend books:  1).Eat to Live by Dr Monico Hoar. 2).Prevent and Reverse Heart Disease by Dr Suzzette Righter. 3) Dr Katherina Right Book:  Program to Reverse Diabetes  Exercise recommendations are:  If unable to walk, then the patient can exercise in a chair 3 times a day. By flapping arms like a bird gently and raising legs outwards to the front.  If ambulatory, the patient can go for walks for 30 minutes 3 times a week. Then increase the intensity and duration as tolerated.  Goal is to try to attain exercise frequency to 5 times a week.  If applicable: Best to perform resistance exercises (machines or weights) 2 days a week and cardio type exercises 3 days per week.  Will hold off of testosterone replacement due to possibly increasing the Lower tract symptoms.  Will give him a 3 months of  Dietary intervention to see if he will improve his lipids. Meds ordered this encounter  Medications  . cholecalciferol (VITAMIN D) 1000 UNITS tablet    Sig: Take 2,000 Units by mouth daily.   There are no discontinued medications. Return in about 3 months (around 12/27/2013) for Recheck medical  problems.  Linder Prajapati P. Modesto Charon, M.D.

## 2013-09-28 NOTE — Patient Instructions (Signed)
      Dr Gabrien Mentink's Recommendations  For nutrition information, I recommend books:  1).Eat to Live by Dr Joel Fuhrman. 2).Prevent and Reverse Heart Disease by Dr Caldwell Esselstyn. 3) Dr Neal Barnard's Book:  Program to Reverse Diabetes  Exercise recommendations are:  If unable to walk, then the patient can exercise in a chair 3 times a day. By flapping arms like a bird gently and raising legs outwards to the front.  If ambulatory, the patient can go for walks for 30 minutes 3 times a week. Then increase the intensity and duration as tolerated.  Goal is to try to attain exercise frequency to 5 times a week.  If applicable: Best to perform resistance exercises (machines or weights) 2 days a week and cardio type exercises 3 days per week.  

## 2013-09-29 LAB — PSA, TOTAL AND FREE
PSA, Free Pct: 27.5 %
PSA, Free: 0.11 ng/mL
PSA: 0.4 ng/mL (ref 0.0–4.0)

## 2013-09-29 NOTE — Progress Notes (Signed)
Quick Note:  Call patient. Labs-PSA- normal. No change in plan. ______

## 2014-04-06 ENCOUNTER — Other Ambulatory Visit: Payer: Self-pay | Admitting: *Deleted

## 2014-04-06 MED ORDER — SILDENAFIL CITRATE 50 MG PO TABS: 50.0000 mg | ORAL_TABLET | Freq: Every day | ORAL | Status: DC | PRN

## 2014-04-06 NOTE — Telephone Encounter (Signed)
Patient of Dr Jacelyn Grip. Last office visit on 09-28-13. Please advise on refill

## 2014-04-24 ENCOUNTER — Other Ambulatory Visit: Payer: Self-pay | Admitting: Family Medicine

## 2014-05-17 ENCOUNTER — Ambulatory Visit (INDEPENDENT_AMBULATORY_CARE_PROVIDER_SITE_OTHER): Payer: 59 | Admitting: Family

## 2014-05-17 ENCOUNTER — Encounter: Payer: Self-pay | Admitting: Family

## 2014-05-17 VITALS — BP 135/75 | HR 87 | Temp 97.9°F | Ht 70.0 in | Wt 179.0 lb

## 2014-05-17 DIAGNOSIS — Z1321 Encounter for screening for nutritional disorder: Secondary | ICD-10-CM

## 2014-05-17 DIAGNOSIS — I10 Essential (primary) hypertension: Secondary | ICD-10-CM

## 2014-05-17 DIAGNOSIS — E559 Vitamin D deficiency, unspecified: Secondary | ICD-10-CM

## 2014-05-17 DIAGNOSIS — N529 Male erectile dysfunction, unspecified: Secondary | ICD-10-CM

## 2014-05-17 DIAGNOSIS — N400 Enlarged prostate without lower urinary tract symptoms: Secondary | ICD-10-CM | POA: Insufficient documentation

## 2014-05-17 DIAGNOSIS — Z0000 Encounter for general adult medical examination without abnormal findings: Secondary | ICD-10-CM

## 2014-05-17 MED ORDER — TADALAFIL 5 MG PO TABS: 5.0000 mg | ORAL_TABLET | Freq: Every day | ORAL | Status: DC

## 2014-05-17 MED ORDER — AMLODIPINE BESY-BENAZEPRIL HCL 5-40 MG PO CAPS: 1.0000 | ORAL_CAPSULE | Freq: Every day | ORAL | Status: DC

## 2014-05-17 NOTE — Progress Notes (Signed)
   Subjective:    Patient ID: [name], male    DOB: Dec 09, 1960, 53 y.o.   MRN: [id number]  Hypertension This is a new problem. The current episode started more than 1 year ago. The problem has been waxing and waning since onset. The problem is uncontrolled. Pertinent negatives include no anxiety, headaches, malaise/fatigue, palpitations or peripheral edema. Past treatments include angiotensin blockers and calcium channel blockers. The current treatment provides mild improvement. There is no history of kidney disease, CAD/MI, heart failure, left ventricular hypertrophy or a thyroid problem. There is no history of sleep apnea.  Benign Prostatic Hypertrophy This is a chronic problem. The current episode started more than 1 year ago. The problem has been waxing and waning since onset. Irritative symptoms do not include frequency, nocturia or urgency. Pertinent negatives include no dysuria, hesitancy or vomiting. Past treatments include tamsulosin. The treatment provided significant relief. He has been using treatment for 1 to 2 years.      Review of Systems  Constitutional: Negative.  Negative for malaise/fatigue.  HENT: Negative.   Respiratory: Negative.   Cardiovascular: Negative.  Negative for palpitations.  Gastrointestinal: Negative.  Negative for vomiting.  Endocrine: Negative.   Genitourinary: Negative.  Negative for dysuria, hesitancy, urgency, frequency and nocturia.  Musculoskeletal: Negative.   Neurological: Negative.  Negative for headaches.  Hematological: Negative.   Psychiatric/Behavioral: Negative.   All other systems reviewed and are negative.      Objective:   Physical Exam  Vitals reviewed. Constitutional: He is oriented to person, place, and time. He appears well-developed and well-nourished. No distress.  HENT:  Head: Normocephalic.  Right Ear: External ear normal.  Left Ear: External ear normal.  Mouth/Throat: Oropharynx is clear and moist.  Eyes: Pupils  are equal, round, and reactive to light. Right eye exhibits no discharge. Left eye exhibits no discharge.  Neck: Normal range of motion. Neck supple. No thyromegaly present.  Cardiovascular: Normal rate, regular rhythm, normal heart sounds and intact distal pulses.   No murmur heard. Pulmonary/Chest: Effort normal and breath sounds normal. No respiratory distress. He has no wheezes.  Abdominal: Soft. Bowel sounds are normal. He exhibits no distension. There is no tenderness.  Musculoskeletal: Normal range of motion. He exhibits no edema and no tenderness.  Neurological: He is alert and oriented to person, place, and time. He has normal reflexes. No cranial nerve deficit.  Skin: Skin is warm and dry. No rash noted. No erythema.  Psychiatric: He has a normal mood and affect. His behavior is normal. Judgment and thought content normal.    BP 135/75  Pulse 87  Temp(Src) 97.9 F (36.6 C) (Oral)  Ht $R'5\' 10"'Hj$  (1.778 m)  Wt 179 lb (81.194 kg)  BMI 25.68 kg/m2       Assessment & Plan:  1. Essential hypertension - amLODipine-benazepril (LOTREL) 5-40 MG per capsule; Take 1 capsule by mouth daily.  Dispense: 90 capsule; Refill: 4 - CMP14+EGFR  2. Unspecified vitamin D deficiency - CMP14+EGFR  3. BPH (benign prostatic hyperplasia) - CMP14+EGFR - tadalafil (CIALIS) 5 MG tablet; Take 1 tablet (5 mg total) by mouth daily.  Dispense: 30 tablet; Refill: 6  4. Annual physical exam - Lipid panel - Vit D  25 hydroxy (rtn osteoporosis monitoring)  5. Encounter for vitamin deficiency screening - Vit D  25 hydroxy (rtn osteoporosis monitoring)   Continue all meds Labs pending Health Maintenance reviewed Diet and exercise encouraged RTO 6 months  Evelina Dun, FNP

## 2014-05-17 NOTE — Patient Instructions (Signed)
Health Maintenance A healthy lifestyle and preventative care can promote health and wellness.  Maintain regular health, dental, and eye exams.  Eat a healthy diet. Foods like vegetables, fruits, whole grains, low-fat dairy products, and lean protein foods contain the nutrients you need and are low in calories. Decrease your intake of foods high in solid fats, added sugars, and salt. Get information about a proper diet from your health care provider, if necessary.  Regular physical exercise is one of the most important things you can do for your health. Most adults should get at least 150 minutes of moderate-intensity exercise (any activity that increases your heart rate and causes you to sweat) each week. In addition, most adults need muscle-strengthening exercises on 2 or more days a week.   Maintain a healthy weight. The body mass index (BMI) is a screening tool to identify possible weight problems. It provides an estimate of body fat based on height and weight. Your health care provider can find your BMI and can help you achieve or maintain a healthy weight. For males 20 years and older:  A BMI below 18.5 is considered underweight.  A BMI of 18.5 to 24.9 is normal.  A BMI of 25 to 29.9 is considered overweight.  A BMI of 30 and above is considered obese.  Maintain normal blood lipids and cholesterol by exercising and minimizing your intake of saturated fat. Eat a balanced diet with plenty of fruits and vegetables. Blood tests for lipids and cholesterol should begin at age 20 and be repeated every 5 years. If your lipid or cholesterol levels are high, you are over age 50, or you are at high risk for heart disease, you may need your cholesterol levels checked more frequently.Ongoing high lipid and cholesterol levels should be treated with medicines if diet and exercise are not working.  If you smoke, find out from your health care provider how to quit. If you do not use tobacco, do not  start.  Lung cancer screening is recommended for adults aged 55-80 years who are at high risk for developing lung cancer because of a history of smoking. A yearly low-dose CT scan of the lungs is recommended for people who have at least a 30-pack-year history of smoking and are current smokers or have quit within the past 15 years. A pack year of smoking is smoking an average of 1 pack of cigarettes a day for 1 year (for example, a 30-pack-year history of smoking could mean smoking 1 pack a day for 30 years or 2 packs a day for 15 years). Yearly screening should continue until the smoker has stopped smoking for at least 15 years. Yearly screening should be stopped for people who develop a health problem that would prevent them from having lung cancer treatment.  If you choose to drink alcohol, do not have more than 2 drinks per day. One drink is considered to be 12 oz (360 mL) of beer, 5 oz (150 mL) of wine, or 1.5 oz (45 mL) of liquor.  Avoid the use of street drugs. Do not share needles with anyone. Ask for help if you need support or instructions about stopping the use of drugs.  High blood pressure causes heart disease and increases the risk of stroke. Blood pressure should be checked at least every 1-2 years. Ongoing high blood pressure should be treated with medicines if weight loss and exercise are not effective.  If you are 45-79 years old, ask your health care provider if   you should take aspirin to prevent heart disease.  Diabetes screening involves taking a blood sample to check your fasting blood sugar level. This should be done once every 3 years after age 45 if you are at a normal weight and without risk factors for diabetes. Testing should be considered at a younger age or be carried out more frequently if you are overweight and have at least 1 risk factor for diabetes.  Colorectal cancer can be detected and often prevented. Most routine colorectal cancer screening begins at the age of 50  and continues through age 75. However, your health care provider may recommend screening at an earlier age if you have risk factors for colon cancer. On a yearly basis, your health care provider may provide home test kits to check for hidden blood in the stool. A small camera at the end of a tube may be used to directly examine the colon (sigmoidoscopy or colonoscopy) to detect the earliest forms of colorectal cancer. Talk to your health care provider about this at age 50 when routine screening begins. A direct exam of the colon should be repeated every 5-10 years through age 75, unless early forms of precancerous polyps or small growths are found.  People who are at an increased risk for hepatitis B should be screened for this virus. You are considered at high risk for hepatitis B if:  You were born in a country where hepatitis B occurs often. Talk with your health care provider about which countries are considered high risk.  Your parents were born in a high-risk country and you have not received a shot to protect against hepatitis B (hepatitis B vaccine).  You have HIV or AIDS.  You use needles to inject street drugs.  You live with, or have sex with, someone who has hepatitis B.  You are a man who has sex with other men (MSM).  You get hemodialysis treatment.  You take certain medicines for conditions like cancer, organ transplantation, and autoimmune conditions.  Hepatitis C blood testing is recommended for all people born from 1945 through 1965 and any individual with known risk factors for hepatitis C.  Healthy men should no longer receive prostate-specific antigen (PSA) blood tests as part of routine cancer screening. Talk to your health care provider about prostate cancer screening.  Testicular cancer screening is not recommended for adolescents or adult males who have no symptoms. Screening includes self-exam, a health care provider exam, and other screening tests. Consult with your  health care provider about any symptoms you have or any concerns you have about testicular cancer.  Practice safe sex. Use condoms and avoid high-risk sexual practices to reduce the spread of sexually transmitted infections (STIs).  You should be screened for STIs, including gonorrhea and chlamydia if:  You are sexually active and are younger than 24 years.  You are older than 24 years, and your health care provider tells you that you are at risk for this type of infection.  Your sexual activity has changed since you were last screened, and you are at an increased risk for chlamydia or gonorrhea. Ask your health care provider if you are at risk.  If you are at risk of being infected with HIV, it is recommended that you take a prescription medicine daily to prevent HIV infection. This is called pre-exposure prophylaxis (PrEP). You are considered at risk if:  You are a man who has sex with other men (MSM).  You are a heterosexual man who   is sexually active with multiple partners.  You take drugs by injection.  You are sexually active with a partner who has HIV.  Talk with your health care provider about whether you are at high risk of being infected with HIV. If you choose to begin PrEP, you should first be tested for HIV. You should then be tested every 3 months for as long as you are taking PrEP.  Use sunscreen. Apply sunscreen liberally and repeatedly throughout the day. You should seek shade when your shadow is shorter than you. Protect yourself by wearing long sleeves, pants, a wide-brimmed hat, and sunglasses year round whenever you are outdoors.  Tell your health care provider of new moles or changes in moles, especially if there is a change in shape or color. Also, tell your health care provider if a mole is larger than the size of a pencil eraser.  A one-time screening for abdominal aortic aneurysm (AAA) and surgical repair of large AAAs by ultrasound is recommended for men aged  65-75 years who are current or former smokers.  Stay current with your vaccines (immunizations). Document Released: 03/21/2008 Document Revised: 09/28/2013 Document Reviewed: 02/18/2011 ExitCare Patient Information 2015 [employer]. This information is not intended to replace advice given to you by your health care provider. Make sure you discuss any questions you have with your health care provider.  Hypertension Hypertension, commonly called high blood pressure, is when the force of blood pumping through your arteries is too strong. Your arteries are the blood vessels that carry blood from your heart throughout your body. A blood pressure reading consists of a higher number over a lower number, such as 110/72. The higher number (systolic) is the pressure inside your arteries when your heart pumps. The lower number (diastolic) is the pressure inside your arteries when your heart relaxes. Ideally you want your blood pressure below 120/80. Hypertension forces your heart to work harder to pump blood. Your arteries may become narrow or stiff. Having hypertension puts you at risk for heart disease, stroke, and other problems.  RISK FACTORS Some risk factors for high blood pressure are controllable. Others are not.  Risk factors you cannot control include:   Race. You may be at higher risk if you are African American.  Age. Risk increases with age.  Gender. Men are at higher risk than women before age 45 years. After age 65, women are at higher risk than men. Risk factors you can control include:  Not getting enough exercise or physical activity.  Being overweight.  Getting too much fat, sugar, calories, or salt in your diet.  Drinking too much alcohol. SIGNS AND SYMPTOMS Hypertension does not usually cause signs or symptoms. Extremely high blood pressure (hypertensive crisis) may cause headache, anxiety, shortness of breath, and nosebleed. DIAGNOSIS  To check if you have hypertension,  your health care provider will measure your blood pressure while you are seated, with your arm held at the level of your heart. It should be measured at least twice using the same arm. Certain conditions can cause a difference in blood pressure between your right and left arms. A blood pressure reading that is higher than normal on one occasion does not mean that you need treatment. If one blood pressure reading is high, ask your health care provider about having it checked again. TREATMENT  Treating high blood pressure includes making lifestyle changes and possibly taking medicine. Living a healthy lifestyle can help lower high blood pressure. You may need to change   some of your habits. Lifestyle changes may include:  Following the DASH diet. This diet is high in fruits, vegetables, and whole grains. It is low in salt, red meat, and added sugars.  Getting at least 2 hours of brisk physical activity every week.  Losing weight if necessary.  Not smoking.  Limiting alcoholic beverages.  Learning ways to reduce stress. If lifestyle changes are not enough to get your blood pressure under control, your health care provider may prescribe medicine. You may need to take more than one. Work closely with your health care provider to understand the risks and benefits. HOME CARE INSTRUCTIONS  Have your blood pressure rechecked as directed by your health care provider.   Take medicines only as directed by your health care provider. Follow the directions carefully. Blood pressure medicines must be taken as prescribed. The medicine does not work as well when you skip doses. Skipping doses also puts you at risk for problems.   Do not smoke.   Monitor your blood pressure at home as directed by your health care provider. SEEK MEDICAL CARE IF:   You think you are having a reaction to medicines taken.  You have recurrent headaches or feel dizzy.  You have swelling in your ankles.  You have  trouble with your vision. SEEK IMMEDIATE MEDICAL CARE IF:  You develop a severe headache or confusion.  You have unusual weakness, numbness, or feel faint.  You have severe chest or abdominal pain.  You vomit repeatedly.  You have trouble breathing. MAKE SURE YOU:   Understand these instructions.  Will watch your condition.  Will get help right away if you are not doing well or get worse. Document Released: 09/23/2005 Document Revised: 02/07/2014 Document Reviewed: 07/16/2013 ExitCare Patient Information 2015 [employer]. This information is not intended to replace advice given to you by your health care provider. Make sure you discuss any questions you have with your health care provider.  

## 2014-05-18 ENCOUNTER — Other Ambulatory Visit: Payer: Self-pay | Admitting: Family

## 2014-05-18 LAB — LIPID PANEL
CHOL/HDL RATIO: 3.7 ratio (ref 0.0–5.0)
CHOLESTEROL TOTAL: 197 mg/dL (ref 100–199)
HDL: 53 mg/dL (ref >39)
LDL CALC: 105 mg/dL — AB (ref 0–99)
TRIGLYCERIDES: 193 mg/dL — AB (ref 0–149)
VLDL CHOLESTEROL CAL: 39 mg/dL (ref 5–40)

## 2014-05-18 LAB — CMP14+EGFR
ALBUMIN: 4.6 g/dL (ref 3.5–5.5)
ALK PHOS: 71 IU/L (ref 39–117)
ALT: 14 IU/L (ref 0–44)
AST: 18 IU/L (ref 0–40)
Albumin/Globulin Ratio: 1.6 (ref 1.1–2.5)
BILIRUBIN TOTAL: 0.3 mg/dL (ref 0.0–1.2)
BUN / CREAT RATIO: 21 — AB (ref 9–20)
BUN: 19 mg/dL (ref 6–24)
CHLORIDE: 98 mmol/L (ref 97–108)
CO2: 23 mmol/L (ref 18–29)
CREATININE: 0.9 mg/dL (ref 0.76–1.27)
Calcium: 9.7 mg/dL (ref 8.7–10.2)
GFR calc non Af Amer: 96 mL/min/{1.73_m2} (ref >59)
GFR, EST AFRICAN AMERICAN: 112 mL/min/{1.73_m2} (ref >59)
GLOBULIN, TOTAL: 2.8 g/dL (ref 1.5–4.5)
GLUCOSE: 108 mg/dL — AB (ref 65–99)
Potassium: 4 mmol/L (ref 3.5–5.2)
Sodium: 137 mmol/L (ref 134–144)
TOTAL PROTEIN: 7.4 g/dL (ref 6.0–8.5)

## 2014-05-18 LAB — VITAMIN D 25 HYDROXY (VIT D DEFICIENCY, FRACTURES): Vit D, 25-Hydroxy: 29.9 ng/mL — ABNORMAL LOW (ref 30.0–100.0)

## 2014-05-18 MED ORDER — VITAMIN D (ERGOCALCIFEROL) 1.25 MG (50000 UNIT) PO CAPS: 50000.0000 [IU] | ORAL_CAPSULE | ORAL | Status: DC

## 2014-05-18 MED ORDER — SIMVASTATIN 20 MG PO TABS: 20.0000 mg | ORAL_TABLET | Freq: Every day | ORAL | Status: DC

## 2014-11-17 ENCOUNTER — Ambulatory Visit: Payer: 59 | Admitting: Family

## 2015-06-08 ENCOUNTER — Ambulatory Visit (INDEPENDENT_AMBULATORY_CARE_PROVIDER_SITE_OTHER): Payer: 59 | Admitting: Family Medicine

## 2015-06-08 ENCOUNTER — Ambulatory Visit (INDEPENDENT_AMBULATORY_CARE_PROVIDER_SITE_OTHER): Payer: 59

## 2015-06-08 ENCOUNTER — Encounter: Payer: Self-pay | Admitting: Family Medicine

## 2015-06-08 VITALS — BP 123/77 | HR 86 | Temp 97.0°F | Ht 70.0 in | Wt 177.8 lb

## 2015-06-08 DIAGNOSIS — I10 Essential (primary) hypertension: Secondary | ICD-10-CM | POA: Diagnosis not present

## 2015-06-08 DIAGNOSIS — M25562 Pain in left knee: Secondary | ICD-10-CM | POA: Diagnosis not present

## 2015-06-08 DIAGNOSIS — H0016 Chalazion left eye, unspecified eyelid: Secondary | ICD-10-CM

## 2015-06-08 DIAGNOSIS — M25561 Pain in right knee: Secondary | ICD-10-CM | POA: Diagnosis not present

## 2015-06-08 DIAGNOSIS — H0019 Chalazion unspecified eye, unspecified eyelid: Secondary | ICD-10-CM | POA: Insufficient documentation

## 2015-06-08 DIAGNOSIS — M25569 Pain in unspecified knee: Secondary | ICD-10-CM | POA: Insufficient documentation

## 2015-06-08 MED ORDER — AMLODIPINE BESY-BENAZEPRIL HCL 5-40 MG PO CAPS: 1.0000 | ORAL_CAPSULE | Freq: Every day | ORAL | Status: DC

## 2015-06-08 NOTE — Progress Notes (Signed)
   HPI  Patient presents today the pain, hypertension, chalazion  Knee pain Bilateral anterior knee pain for the last 3-4 weeks. He has not changed issues. 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 He states that his pain is worse at the end of the day and sometimes in the morning. He has some relief with one Aleve twice daily He denies locking or popping symptoms, however he does have some anterior knee pain with twisting. He has difficulty going up stairs at the end of the day. He uses compression at night, he has not tried any ice. He is moderately active with 2-3 rounds of golf per week and walking a lot at work.  Hypertension Good compliance No chest pain, dyspnea, palpitations, leg edema Good compliance  Chalazion Left eye, now resolving after warm compresses  PMH: Smoking status noted ROS: Per HPI  Objective: BP 123/77 mmHg  Pulse 86  Temp(Src) 97 F (36.1 C) (Oral)  Ht $R'5\' 10"'oM$  (1.778 m)  Wt 177 lb 12.8 oz (80.65 kg)  BMI 25.51 kg/m2 Gen: NAD, alert, cooperative with exam HEENT: NCAT, small erythematous papule on left lower eyelid CV: RRR, good S1/S2, no murmur Resp: CTABL, no wheezes, non-labored Ext: No edema, warm Neuro: Alert and oriented, No gross deficits MSK: BL knee without erythema, effusion, bruising, or gross deformity No joint line tenderness.  ligamentously intact to Lachman's and with varus and valgus stress.  Negative McMurray's test  DG knee bilateral 06/08/2015: Very mild evidence of osteoarthritis with sclerosis and bilateral medial compartments, no joint space narrowing  Assessment and plan:  # Knee pain X-ray without significant osteoarthritis Possibly meniscal pathology versus mild arthritis, continue NSAIDs, and compression during the day, ice Offer orthopedic/sports medicine referral Follow-up if worsens or fails to resolve, if not improved in 4 weeks return for referral  #  Hypertension Well-controlled, continue Lotrel Refill written Labs  # Chalazion Resolving Encouraged warm compresses    Orders Placed This Encounter  Procedures  . DG Knee Bilateral Standing AP    Standing Status: Future     Number of Occurrences: 1     Standing Expiration Date: 08/07/2016    Order Specific Question:  Reason for Exam (SYMPTOM  OR DIAGNOSIS REQUIRED)    Answer:  knee pain    Order Specific Question:  Preferred imaging location?    Answer:  Internal  . CBC with Differential  . CMP14+EGFR  . Lipid Panel    Meds ordered this encounter  Medications  . sildenafil (REVATIO) 20 MG tablet    Sig: Take 20 mg by mouth 3 (three) times daily.    Laroy Apple, MD [facility] 06/08/2015, 9:31 AM

## 2015-06-08 NOTE — Patient Instructions (Signed)
Great to meet you!  It sounds like you have knee arthritis and maybe some meniscus pathology, the best thing to do is ice at night, compression with activity, and aleve as you are doing.   We will call about your labs within a week

## 2015-06-08 NOTE — Addendum Note (Signed)
Addended by: Nigel Berthold C on: 06/08/2015 10:09 AM   Modules accepted: Orders

## 2015-06-22 ENCOUNTER — Other Ambulatory Visit: Payer: Self-pay | Admitting: Family

## 2015-08-16 ENCOUNTER — Telehealth: Payer: Self-pay

## 2015-08-16 DIAGNOSIS — I10 Essential (primary) hypertension: Secondary | ICD-10-CM

## 2015-08-16 NOTE — Telephone Encounter (Signed)
-----   Message from Kenneth Arista, RN sent at 08/15/2015  4:38 PM EST ----- [name] please call this patient and tell him to come in for fasting labs per Dr Wendi Snipes  ----- Message -----    From: Kenneth Euler, MD    Sent: 08/15/2015   9:42 AM      To: [facility]  Will you call and ask him to come in for fasting labs?They are ordered but it looks like he never came back in.   Thanks! Sam  ----- Message -----    From: SYSTEM    Sent: 08/13/2015  12:04 AM      To: Kenneth Euler, MD

## 2015-09-06 ENCOUNTER — Telehealth: Payer: Self-pay | Admitting: Family

## 2016-02-07 ENCOUNTER — Encounter: Payer: Self-pay | Admitting: Family Medicine

## 2016-02-07 ENCOUNTER — Ambulatory Visit (INDEPENDENT_AMBULATORY_CARE_PROVIDER_SITE_OTHER): Payer: 59 | Admitting: Family Medicine

## 2016-02-07 ENCOUNTER — Ambulatory Visit (INDEPENDENT_AMBULATORY_CARE_PROVIDER_SITE_OTHER): Payer: 59

## 2016-02-07 VITALS — HR 123 | Temp 101.2°F | Ht 70.0 in | Wt 179.4 lb

## 2016-02-07 DIAGNOSIS — R05 Cough: Secondary | ICD-10-CM | POA: Diagnosis not present

## 2016-02-07 DIAGNOSIS — R52 Pain, unspecified: Secondary | ICD-10-CM

## 2016-02-07 DIAGNOSIS — R509 Fever, unspecified: Secondary | ICD-10-CM

## 2016-02-07 DIAGNOSIS — R059 Cough, unspecified: Secondary | ICD-10-CM

## 2016-02-07 LAB — VERITOR FLU A/B WAIVED
INFLUENZA A: NEGATIVE
INFLUENZA B: NEGATIVE

## 2016-02-07 NOTE — Progress Notes (Signed)
Quick Note:  Your chest x-ray looked normal. Thanks, WS. ______ 

## 2016-02-07 NOTE — Progress Notes (Signed)
Subjective:  Patient ID: [name], male    DOB: [birth year]  Age: 55 y.o. MRN: [id number]  CC: flu like sxs   HPI [name] [facility] presents for  Patient presents with dry cough runny stuffy nose. Diffuse headache of moderate intensity. Patient also has chills and subjective fever. Body aches worst in the back but present in the legs, shoulders, and torso as well. Has sapped the energy to the point that of being unable to perform usual activities other than ADLs. Onset 2 days ago.    History [name] has a past medical history of Hypertension; Kidney stones; and Low testosterone.   He has past surgical history that includes bone spur surgery (Right) and Eye surgery.   His family history includes Cancer in his father; Hypertension in his father and mother.He reports that he has never smoked. He does not have any smokeless tobacco history on file. He reports that he does not drink alcohol or use illicit drugs.    ROS Review of Systems  Constitutional: Positive for fever and chills. Negative for activity change and appetite change.  HENT: Negative for congestion, ear discharge, ear pain, hearing loss, nosebleeds, postnasal drip, rhinorrhea, sinus pressure, sneezing and trouble swallowing.   Respiratory: Positive for cough. Negative for chest tightness and shortness of breath.   Cardiovascular: Negative for chest pain and palpitations.  Skin: Negative for rash.  Neurological: Positive for headaches.    Objective:  Pulse 123  Temp(Src) 101.2 F (38.4 C) (Oral)  Ht 5\' 10"  (1.778 m)  Wt 179 lb 6.4 oz (81.375 kg)  BMI 25.74 kg/m2  SpO2 98%  BP Readings from Last 3 Encounters:  06/08/15 123/77  05/17/14 135/75  09/28/13 128/85    Wt Readings from Last 3 Encounters:  02/07/16 179 lb 6.4 oz (81.375 kg)  06/08/15 177 lb 12.8 oz (80.65 kg)  05/17/14 179 lb (81.194 kg)     Physical Exam  Constitutional: He is oriented to person, place, and time. He appears well-developed and  well-nourished. He appears distressed (looks ill).  HENT:  Head: Normocephalic and atraumatic.  Right Ear: Tympanic membrane and external ear normal. No decreased hearing is noted.  Left Ear: Tympanic membrane and external ear normal. No decreased hearing is noted.  Nose: Mucosal edema present. Right sinus exhibits no frontal sinus tenderness. Left sinus exhibits no frontal sinus tenderness.  Mouth/Throat: No oropharyngeal exudate or posterior oropharyngeal erythema.  Neck: No Brudzinski's sign noted.  Pulmonary/Chest: Breath sounds normal. No respiratory distress.  Musculoskeletal: Normal range of motion.  Lymphadenopathy:       Head (right side): No preauricular adenopathy present.       Head (left side): No preauricular adenopathy present.       Right cervical: No superficial cervical adenopathy present.      Left cervical: No superficial cervical adenopathy present.  Neurological: He is alert and oriented to person, place, and time.  Skin: Skin is warm and dry. There is erythema (face flushed).     Lab Results  Component Value Date   HGB 14.6 08/20/2010   HCT 43.0 08/20/2010   GLUCOSE 108* 05/17/2014   CHOL 197 05/17/2014   TRIG 193* 05/17/2014   HDL 53 05/17/2014   LDLCALC 105* 05/17/2014   ALT 14 05/17/2014   AST 18 05/17/2014   NA 137 05/17/2014   K 4.0 05/17/2014   CL 98 05/17/2014   CREATININE 0.90 05/17/2014   BUN 19 05/17/2014   CO2 23 05/17/2014  TSH 1.142 02/01/2013   PSA 0.4 09/28/2013   HGBA1C 5.1 02/01/2013    No results found.  Assessment & Plan:   [name] was seen today for flu like sxs.  Diagnoses and all orders for this visit:  Cough -     Veritor Flu A/B Waived -     DG Chest 2 View; Future -     CBC with Differential/Platelet -     West Nile Antibodies, IGG and IGM -     Zika Virus NAA Comprehensive -     Arbovirus panel (West Nile, etc)-perf at [facility] state lab  Other specified fever -     Veritor Flu A/B Waived -     DG Chest 2 View;  Future -     CBC with Differential/Platelet -     West Nile Antibodies, IGG and IGM -     Zika Virus NAA Comprehensive -     Arbovirus panel (West Nile, etc)-perf at [facility] state lab  Body aches -     Veritor Flu A/B Waived -     CBC with Differential/Platelet -     West Nile Antibodies, IGG and IGM -     Zika Virus NAA Comprehensive -     Arbovirus panel (West Nile, etc)-perf at [facility] state lab  CXR- no infiltrate   Rest at home, fluids, ibuprofen, tylenol. Follow up for increased sx especially HA or photosensitivity  I am having [name] maintain his cholecalciferol, tamsulosin, sildenafil, and amLODipine-benazepril.  No orders of the defined types were placed in this encounter.     Follow-up: Return if symptoms worsen or fail to improve.  Claretta Fraise, M.D.

## 2016-02-10 LAB — CBC WITH DIFFERENTIAL/PLATELET
BASOS: 0 %
Basophils Absolute: 0 10*3/uL (ref 0.0–0.2)
EOS (ABSOLUTE): 0 10*3/uL (ref 0.0–0.4)
Eos: 0 %
HEMOGLOBIN: 15.1 g/dL (ref 12.6–17.7)
Hematocrit: 44.1 % (ref 37.5–51.0)
IMMATURE GRANS (ABS): 0 10*3/uL (ref 0.0–0.1)
Immature Granulocytes: 0 %
LYMPHS ABS: 1.1 10*3/uL (ref 0.7–3.1)
LYMPHS: 9 %
MCH: 32 pg (ref 26.6–33.0)
MCHC: 34.2 g/dL (ref 31.5–35.7)
MCV: 93 fL (ref 79–97)
Monocytes Absolute: 0.7 10*3/uL (ref 0.1–0.9)
Monocytes: 6 %
NEUTROS ABS: 10.4 10*3/uL — AB (ref 1.4–7.0)
Neutrophils: 85 %
Platelets: 260 10*3/uL (ref 150–379)
RBC: 4.72 x10E6/uL (ref 4.14–5.80)
RDW: 13.6 % (ref 12.3–15.4)
WBC: 12.2 10*3/uL — ABNORMAL HIGH (ref 3.4–10.8)

## 2016-02-10 LAB — WEST NILE ANTIBODIES, IGG AND IGM
West Nile Virus, IgG: NEGATIVE
West Nile Virus, IgM: NEGATIVE

## 2016-02-10 LAB — ZIKA VIRUS NAA COMPREHENSIVE
ZIKA VIRUS, NAA, URINE: NEGATIVE
Zika Virus, NAA, Serum: NEGATIVE

## 2016-06-28 ENCOUNTER — Other Ambulatory Visit: Payer: Self-pay | Admitting: Family Medicine

## 2016-06-28 DIAGNOSIS — I10 Essential (primary) hypertension: Secondary | ICD-10-CM

## 2016-10-02 ENCOUNTER — Other Ambulatory Visit: Payer: Self-pay | Admitting: Family

## 2016-10-02 DIAGNOSIS — I10 Essential (primary) hypertension: Secondary | ICD-10-CM

## 2016-10-31 ENCOUNTER — Encounter: Payer: Self-pay | Admitting: Family

## 2016-10-31 ENCOUNTER — Ambulatory Visit (INDEPENDENT_AMBULATORY_CARE_PROVIDER_SITE_OTHER): Payer: 59 | Admitting: Family

## 2016-10-31 VITALS — BP 127/86 | HR 78 | Temp 98.3°F | Ht 70.0 in | Wt 177.2 lb

## 2016-10-31 DIAGNOSIS — M25511 Pain in right shoulder: Secondary | ICD-10-CM

## 2016-10-31 DIAGNOSIS — M778 Other enthesopathies, not elsewhere classified: Secondary | ICD-10-CM

## 2016-10-31 DIAGNOSIS — M7581 Other shoulder lesions, right shoulder: Secondary | ICD-10-CM

## 2016-10-31 MED ORDER — KETOROLAC TROMETHAMINE 60 MG/2ML IM SOLN
60.0000 mg | Freq: Once | INTRAMUSCULAR | Status: AC
  Administered 2016-10-31: 60 mg via INTRAMUSCULAR

## 2016-10-31 MED ORDER — METHYLPREDNISOLONE ACETATE 80 MG/ML IJ SUSP
80.0000 mg | Freq: Once | INTRAMUSCULAR | Status: AC
  Administered 2016-10-31: 80 mg via INTRAMUSCULAR

## 2016-10-31 MED ORDER — NAPROXEN 500 MG PO TABS: 500.0000 mg | ORAL_TABLET | Freq: Two times a day (BID) | ORAL | 1 refills | Status: DC

## 2016-10-31 NOTE — Progress Notes (Signed)
   Subjective:    Patient ID: [name], male    DOB: Oct 14, 1960, 56 y.o.   MRN: [id number]  Shoulder Pain   The pain is present in the right shoulder. This is a new problem. The current episode started yesterday. There has been no history of extremity trauma. The problem occurs constantly. The problem has been unchanged. The quality of the pain is described as aching. The pain is at a severity of 8/10. The pain is moderate. Pertinent negatives include no inability to bear weight, itching, joint swelling, limited range of motion, numbness, stiffness or tingling. He has tried OTC pain meds, rest, cold and heat for the symptoms. The treatment provided mild relief.      Review of Systems  Musculoskeletal: Positive for arthralgias (shoulder pain). Negative for stiffness.  Skin: Negative for itching.  Neurological: Negative for tingling and numbness.  All other systems reviewed and are negative.      Objective:   Physical Exam  Constitutional: He is oriented to person, place, and time. He appears well-developed and well-nourished. No distress.  HENT:  Head: Normocephalic.  Eyes: Pupils are equal, round, and reactive to light. Right eye exhibits no discharge. Left eye exhibits no discharge.  Neck: Normal range of motion. Neck supple. No thyromegaly present.  Cardiovascular: Normal rate, regular rhythm, normal heart sounds and intact distal pulses.   No murmur heard. Pulmonary/Chest: Effort normal and breath sounds normal. No respiratory distress. He has no wheezes.  Abdominal: Soft. Bowel sounds are normal. He exhibits no distension. There is no tenderness.  Musculoskeletal: Normal range of motion. He exhibits tenderness. He exhibits no edema.  Right shoulder anterior tenderness with flexion  Neurological: He is alert and oriented to person, place, and time.  Skin: Skin is warm and dry. No rash noted. No erythema.  Psychiatric: He has a normal mood and affect. His behavior is normal.  Judgment and thought content normal.  Vitals reviewed.   BP 127/86   Pulse 78   Temp 98.3 F (36.8 C) (Oral)   Wt 177 lb 3.2 oz (80.4 kg)   BMI 25.43 kg/m       Assessment & Plan:  1. Tendonitis of shoulder, right -Rest -ICe -ROM exercises encouraged RTO prn  - methylPREDNISolone acetate (DEPO-MEDROL) injection 80 mg; Inject 1 mL (80 mg total) into the muscle once. - ketorolac (TORADOL) injection 60 mg; Inject 2 mLs (60 mg total) into the muscle once. - naproxen (NAPROSYN) 500 MG tablet; Take 1 tablet (500 mg total) by mouth 2 (two) times daily with a meal.  Dispense: 60 tablet; Refill: [place], FNP

## 2016-10-31 NOTE — Patient Instructions (Signed)
Shoulder Pain Many things can cause shoulder pain, including:  An injury to the area.  Overuse of the shoulder.  Arthritis. The source of the pain can be:  Inflammation.  An injury to the shoulder joint.  An injury to a tendon, ligament, or bone. Follow these instructions at home: Take these actions to help with your pain:  Squeeze a soft ball or a foam pad as much as possible. This helps to keep the shoulder from swelling. It also helps to strengthen the arm.  Take over-the-counter and prescription medicines only as told by your health care provider.  If directed, apply ice to the area:  Put ice in a plastic bag.  Place a towel between your skin and the bag.  Leave the ice on for 20 minutes, 2-3 times per day. Stop applying ice if it does not help with the pain.  If you were given a shoulder sling or immobilizer:  Wear it as told.  Remove it to shower or bathe.  Move your arm as little as possible, but keep your hand moving to prevent swelling. Contact a health care provider if:  Your pain gets worse.  Your pain is not relieved with medicines.  New pain develops in your arm, hand, or fingers. Get help right away if:  Your arm, hand, or fingers:  Tingle.  Become numb.  Become swollen.  Become painful.  Turn white or blue. This information is not intended to replace advice given to you by your health care provider. Make sure you discuss any questions you have with your health care provider. Document Released: 07/03/2005 Document Revised: 05/19/2016 Document Reviewed: 01/16/2015 Elsevier Interactive Patient Education  2017 Elsevier Inc.  

## 2016-12-23 ENCOUNTER — Other Ambulatory Visit: Payer: Self-pay | Admitting: Family

## 2016-12-23 DIAGNOSIS — I10 Essential (primary) hypertension: Secondary | ICD-10-CM

## 2017-01-13 ENCOUNTER — Ambulatory Visit: Payer: 59 | Admitting: Family

## 2017-01-16 ENCOUNTER — Ambulatory Visit (INDEPENDENT_AMBULATORY_CARE_PROVIDER_SITE_OTHER): Payer: 59 | Admitting: Family

## 2017-01-16 ENCOUNTER — Encounter: Payer: Self-pay | Admitting: Family

## 2017-01-16 VITALS — BP 134/81 | HR 93 | Temp 99.2°F | Ht 70.0 in | Wt 174.6 lb

## 2017-01-16 DIAGNOSIS — R351 Nocturia: Secondary | ICD-10-CM

## 2017-01-16 DIAGNOSIS — Z0000 Encounter for general adult medical examination without abnormal findings: Secondary | ICD-10-CM | POA: Diagnosis not present

## 2017-01-16 DIAGNOSIS — N529 Male erectile dysfunction, unspecified: Secondary | ICD-10-CM

## 2017-01-16 DIAGNOSIS — E785 Hyperlipidemia, unspecified: Secondary | ICD-10-CM | POA: Diagnosis not present

## 2017-01-16 DIAGNOSIS — N401 Enlarged prostate with lower urinary tract symptoms: Secondary | ICD-10-CM

## 2017-01-16 DIAGNOSIS — R5383 Other fatigue: Secondary | ICD-10-CM | POA: Diagnosis not present

## 2017-01-16 DIAGNOSIS — I10 Essential (primary) hypertension: Secondary | ICD-10-CM | POA: Diagnosis not present

## 2017-01-16 DIAGNOSIS — E559 Vitamin D deficiency, unspecified: Secondary | ICD-10-CM

## 2017-01-16 MED ORDER — AMLODIPINE BESY-BENAZEPRIL HCL 5-40 MG PO CAPS: 1.0000 | ORAL_CAPSULE | Freq: Every day | ORAL | 3 refills | Status: DC

## 2017-01-16 NOTE — Progress Notes (Signed)
Subjective:    Patient ID: [name], male    DOB: 1961-02-18, 56 y.o.   MRN: [id number]  Pt presents to the office today for CPE. Pt is followed by Urologists annually.  Hypertension  This is a chronic problem. The current episode started more than 1 year ago. The problem has been resolved since onset. The problem is controlled. Associated symptoms include malaise/fatigue. Pertinent negatives include no headaches, peripheral edema or shortness of breath. Risk factors for coronary artery disease include male gender, dyslipidemia, sedentary lifestyle and family history. Past treatments include ACE inhibitors and calcium channel blockers. The current treatment provides mild improvement. There is no history of kidney disease, CAD/MI, CVA, heart failure or retinopathy.  Hyperlipidemia  This is a chronic problem. The current episode started more than 1 year ago. The problem is uncontrolled. Recent lipid tests were reviewed and are high. Pertinent negatives include no shortness of breath. Current antihyperlipidemic treatment includes exercise and diet change. The current treatment provides mild improvement of lipids. Risk factors for coronary artery disease include a sedentary lifestyle, male sex, hypertension, dyslipidemia and family history.  Benign Prostatic Hypertrophy  This is a chronic problem. Irritative symptoms do not include nocturia.  ED PT currently taking sildenafil. Stable     Review of Systems  Constitutional: Positive for fatigue and malaise/fatigue.  Respiratory: Negative for shortness of breath.   Genitourinary: Negative for nocturia.  Neurological: Negative for headaches.  All other systems reviewed and are negative.      Objective:   Physical Exam  Constitutional: He is oriented to person, place, and time. He appears well-developed and well-nourished. No distress.  HENT:  Head: Normocephalic.  Right Ear: External ear normal.  Left Ear: External ear normal.  Nose:  Nose normal.  Mouth/Throat: Oropharynx is clear and moist.  Eyes: Pupils are equal, round, and reactive to light. Right eye exhibits no discharge. Left eye exhibits no discharge.  Neck: Normal range of motion. Neck supple. No thyromegaly present.  Cardiovascular: Normal rate, regular rhythm, normal heart sounds and intact distal pulses.   No murmur heard. Pulmonary/Chest: Effort normal and breath sounds normal. No respiratory distress. He has no wheezes.  Abdominal: Soft. Bowel sounds are normal. He exhibits no distension. There is no tenderness.  Musculoskeletal: Normal range of motion. He exhibits no edema or tenderness.  Neurological: He is alert and oriented to person, place, and time. He has normal reflexes. No cranial nerve deficit.  Skin: Skin is warm and dry. No rash noted. No erythema.  Psychiatric: He has a normal mood and affect. His behavior is normal. Judgment and thought content normal.  Vitals reviewed.  EKG- Sinus  Rhythm   BP 134/81   Pulse 93   Temp 99.2 F (37.3 C) (Oral)   Ht _0  (1.778 m)   Wt 174 lb 9.6 oz (79.2 kg)   BMI 25.05 kg/m      Assessment & Plan:  1. Annual physical exam  2. Essential hypertension - amLODipine-benazepril (LOTREL) 5-40 MG capsule; Take 1 capsule by mouth daily.  Dispense: 90 capsule; Refill: 3 - CMP14+EGFR - EKG 12-Lead  3. Erectile dysfunction, unspecified erectile dysfunction type - CMP14+EGFR  4. Benign prostatic hyperplasia with nocturia - CMP14+EGFR  5. Dyslipidemia - CMP14+EGFR - Lipid panel - EKG 12-Lead  6. Vitamin D deficiency - CMP14+EGFR  7. Fatigue, unspecified type - CMP14+EGFR - Thyroid Panel With TSH - VITAMIN D 25 Hydroxy (Vit-D Deficiency, Fractures) - EKG 12-Lead   Continue all  meds Labs pending Health Maintenance reviewed Diet and exercise encouraged RTO 1 year   Evelina Dun, FNP

## 2017-01-16 NOTE — Patient Instructions (Signed)

## 2017-01-17 LAB — CMP14+EGFR
ALBUMIN: 4.6 g/dL (ref 3.5–5.5)
ALK PHOS: 71 IU/L (ref 39–117)
ALT: 15 IU/L (ref 0–44)
AST: 17 IU/L (ref 0–40)
Albumin/Globulin Ratio: 1.5 (ref 1.2–2.2)
BILIRUBIN TOTAL: 0.3 mg/dL (ref 0.0–1.2)
BUN/Creatinine Ratio: 21 — ABNORMAL HIGH (ref 9–20)
BUN: 21 mg/dL (ref 6–24)
CHLORIDE: 98 mmol/L (ref 96–106)
CO2: 27 mmol/L (ref 18–29)
CREATININE: 0.99 mg/dL (ref 0.76–1.27)
Calcium: 9.5 mg/dL (ref 8.7–10.2)
GFR calc Af Amer: 98 mL/min/{1.73_m2} (ref >59)
GFR calc non Af Amer: 85 mL/min/{1.73_m2} (ref >59)
GLUCOSE: 81 mg/dL (ref 65–99)
Globulin, Total: 3 g/dL (ref 1.5–4.5)
Potassium: 4.3 mmol/L (ref 3.5–5.2)
SODIUM: 139 mmol/L (ref 134–144)
TOTAL PROTEIN: 7.6 g/dL (ref 6.0–8.5)

## 2017-01-17 LAB — LIPID PANEL
CHOLESTEROL TOTAL: 180 mg/dL (ref 100–199)
Chol/HDL Ratio: 3.9 ratio (ref 0.0–5.0)
HDL: 46 mg/dL (ref >39)
LDL Calculated: 95 mg/dL (ref 0–99)
Triglycerides: 193 mg/dL — ABNORMAL HIGH (ref 0–149)
VLDL Cholesterol Cal: 39 mg/dL (ref 5–40)

## 2017-01-17 LAB — THYROID PANEL WITH TSH
FREE THYROXINE INDEX: 1.6 (ref 1.2–4.9)
T3 UPTAKE RATIO: 23 % — AB (ref 24–39)
T4 TOTAL: 6.8 ug/dL (ref 4.5–12.0)
TSH: 2.99 u[IU]/mL (ref 0.450–4.500)

## 2017-01-17 LAB — VITAMIN D 25 HYDROXY (VIT D DEFICIENCY, FRACTURES): VIT D 25 HYDROXY: 23.2 ng/mL — AB (ref 30.0–100.0)

## 2017-01-18 ENCOUNTER — Other Ambulatory Visit: Payer: Self-pay | Admitting: Family

## 2017-01-18 MED ORDER — VITAMIN D (ERGOCALCIFEROL) 1.25 MG (50000 UNIT) PO CAPS: 50000.0000 [IU] | ORAL_CAPSULE | ORAL | 3 refills | Status: DC

## 2017-02-26 DIAGNOSIS — D1800 Hemangioma unspecified site: Secondary | ICD-10-CM | POA: Diagnosis not present

## 2017-02-26 DIAGNOSIS — L570 Actinic keratosis: Secondary | ICD-10-CM | POA: Diagnosis not present

## 2017-02-26 DIAGNOSIS — D485 Neoplasm of uncertain behavior of skin: Secondary | ICD-10-CM | POA: Diagnosis not present

## 2017-04-25 ENCOUNTER — Other Ambulatory Visit: Payer: Self-pay | Admitting: Family

## 2017-04-25 DIAGNOSIS — I10 Essential (primary) hypertension: Secondary | ICD-10-CM

## 2017-07-23 DIAGNOSIS — N200 Calculus of kidney: Secondary | ICD-10-CM | POA: Diagnosis not present

## 2017-07-23 DIAGNOSIS — N401 Enlarged prostate with lower urinary tract symptoms: Secondary | ICD-10-CM | POA: Diagnosis not present

## 2017-07-23 DIAGNOSIS — R109 Unspecified abdominal pain: Secondary | ICD-10-CM | POA: Diagnosis not present

## 2017-07-23 DIAGNOSIS — R3121 Asymptomatic microscopic hematuria: Secondary | ICD-10-CM | POA: Diagnosis not present

## 2017-07-25 ENCOUNTER — Other Ambulatory Visit: Payer: Self-pay | Admitting: Family

## 2017-07-25 DIAGNOSIS — I10 Essential (primary) hypertension: Secondary | ICD-10-CM

## 2017-08-27 DIAGNOSIS — R3912 Poor urinary stream: Secondary | ICD-10-CM | POA: Diagnosis not present

## 2017-08-27 DIAGNOSIS — N401 Enlarged prostate with lower urinary tract symptoms: Secondary | ICD-10-CM | POA: Diagnosis not present

## 2017-10-08 ENCOUNTER — Encounter: Payer: Self-pay | Admitting: Family Medicine

## 2017-10-08 ENCOUNTER — Ambulatory Visit: Payer: 59 | Admitting: Family Medicine

## 2017-10-08 VITALS — BP 130/87 | HR 91 | Temp 98.4°F | Ht 70.0 in | Wt 183.0 lb

## 2017-10-08 DIAGNOSIS — H66001 Acute suppurative otitis media without spontaneous rupture of ear drum, right ear: Secondary | ICD-10-CM

## 2017-10-08 MED ORDER — AZITHROMYCIN 250 MG PO TABS: ORAL_TABLET | ORAL | 0 refills | Status: DC

## 2017-10-08 NOTE — Progress Notes (Signed)
BP 130/87   Pulse 91   Temp 98.4 F (36.9 C) (Oral)   Ht 5\' 10"  (1.778 m)   Wt 183 lb (83 kg)   BMI 26.26 kg/m    Subjective:    Patient ID: [name], male    DOB: 08-28-1961, 57 y.o.   MRN: [id number]  HPI: [name] is a 57 y.o. male presenting on 10/08/2017 for Chest and head congestion, cough (began 4 days ago, using Nyquil, cough worsens at night and is nonproductive)   HPI Cough and head congestion and sinus pressure Patient is coming in for cough and head congestion and sinus pressure and ear pressure is been going on for the past 4 days.  He says he has been using Sudafed and NyQuil without much success.  He denies any fevers or chills or shortness of breath or wheezing.  He says he has a lot of chest congestion and drainage and coughing that is worse significantly at night.  He says the pressure in his ear just started overnight as well.  He denies any sick contacts that he knows of but was around family over the holidays may have caught something there.  Relevant past medical, surgical, family and social history reviewed and updated as indicated. Interim medical history since our last visit reviewed. Allergies and medications reviewed and updated.  Review of Systems  Constitutional: Negative for chills and fever.  HENT: Positive for congestion, ear pain, postnasal drip, rhinorrhea and sinus pressure. Negative for ear discharge, sneezing, sore throat and voice change.   Eyes: Negative for pain, discharge, redness and visual disturbance.  Respiratory: Positive for cough. Negative for shortness of breath and wheezing.   Cardiovascular: Negative for chest pain and leg swelling.  Musculoskeletal: Negative for gait problem.  Skin: Negative for rash.  All other systems reviewed and are negative.   Per HPI unless specifically indicated above        Objective:    BP 130/87   Pulse 91   Temp 98.4 F (36.9 C) (Oral)   Ht 5\' 10"  (1.778 m)   Wt 183 lb (83 kg)    BMI 26.26 kg/m   Wt Readings from Last 3 Encounters:  10/08/17 183 lb (83 kg)  01/16/17 174 lb 9.6 oz (79.2 kg)  10/31/16 177 lb 3.2 oz (80.4 kg)    Physical Exam  Constitutional: He is oriented to person, place, and time. He appears well-developed and well-nourished. No distress.  HENT:  Right Ear: Tympanic membrane, external ear and ear canal normal.  Left Ear: Tympanic membrane, external ear and ear canal normal.  Nose: Mucosal edema and rhinorrhea present. No sinus tenderness. No epistaxis. Right sinus exhibits maxillary sinus tenderness. Right sinus exhibits no frontal sinus tenderness. Left sinus exhibits maxillary sinus tenderness. Left sinus exhibits no frontal sinus tenderness.  Mouth/Throat: Uvula is midline and mucous membranes are normal. Posterior oropharyngeal edema and posterior oropharyngeal erythema present. No oropharyngeal exudate or tonsillar abscesses.  Eyes: Conjunctivae and EOM are normal. Pupils are equal, round, and reactive to light. No scleral icterus.  Neck: Neck supple.  Cardiovascular: Normal rate, regular rhythm, normal heart sounds and intact distal pulses.  No murmur heard. Pulmonary/Chest: Effort normal and breath sounds normal. No respiratory distress. He has no wheezes. He has no rales.  Musculoskeletal: Normal range of motion. He exhibits no edema.  Lymphadenopathy:    He has no cervical adenopathy.  Neurological: He is alert and oriented to person, place, and time. Coordination  normal.  Skin: Skin is warm and dry. No rash noted. He is not diaphoretic.  Psychiatric: He has a normal mood and affect. His behavior is normal.  Nursing note and vitals reviewed.       Assessment & Plan:   Problem List Items Addressed This Visit    None    Visit Diagnoses    Acute suppurative otitis media of right ear without spontaneous rupture of tympanic membrane, recurrence not specified    -  Primary   Relevant Medications   azithromycin (ZITHROMAX) 250 MG  tablet       Follow up plan: Return if symptoms worsen or fail to improve.  Counseling provided for all of the vaccine components No orders of the defined types were placed in this encounter.   Caryl Pina, MD [facility] 10/08/2017, 9:18 AM

## 2017-12-17 ENCOUNTER — Encounter: Payer: Self-pay | Admitting: Family

## 2017-12-17 ENCOUNTER — Ambulatory Visit: Payer: 59 | Admitting: Family

## 2017-12-17 VITALS — BP 127/82 | HR 83 | Temp 98.3°F | Ht 70.0 in | Wt 179.8 lb

## 2017-12-17 DIAGNOSIS — Z9189 Other specified personal risk factors, not elsewhere classified: Secondary | ICD-10-CM

## 2017-12-17 DIAGNOSIS — I10 Essential (primary) hypertension: Secondary | ICD-10-CM

## 2017-12-17 DIAGNOSIS — R351 Nocturia: Secondary | ICD-10-CM

## 2017-12-17 DIAGNOSIS — Z1159 Encounter for screening for other viral diseases: Secondary | ICD-10-CM | POA: Diagnosis not present

## 2017-12-17 DIAGNOSIS — E785 Hyperlipidemia, unspecified: Secondary | ICD-10-CM | POA: Diagnosis not present

## 2017-12-17 DIAGNOSIS — N401 Enlarged prostate with lower urinary tract symptoms: Secondary | ICD-10-CM | POA: Diagnosis not present

## 2017-12-17 DIAGNOSIS — E559 Vitamin D deficiency, unspecified: Secondary | ICD-10-CM

## 2017-12-17 DIAGNOSIS — Z0000 Encounter for general adult medical examination without abnormal findings: Secondary | ICD-10-CM | POA: Diagnosis not present

## 2017-12-17 NOTE — Patient Instructions (Signed)

## 2017-12-17 NOTE — Progress Notes (Signed)
Subjective:    Patient ID: [name], male    DOB: 01-02-1961, 57 y.o.   MRN: [id number]  Pt presents to the office today for CPE and form for work. Pt is followed by Urologists annually for BPH and family hx of prostate cancer.    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 Pt reports being on this team for 25 years, however in 2011 he had a Panic attack while in the meeting.   PT was seen by Psychologists and diagnosed with panic attack and agoraphobia. At that point he was excused from the [facility], however [facility] requires this form to be resubmitted.   Hypertension  This is a chronic problem. The current episode started more than 1 year ago. The problem has been resolved since onset. The problem is controlled. Pertinent negatives include no headaches, peripheral edema or shortness of breath. Risk factors for coronary artery disease include dyslipidemia and male gender. The current treatment provides moderate improvement. There is no history of kidney disease or CAD/MI.  Hyperlipidemia  This is a chronic problem. The current episode started more than 1 year ago. Recent lipid tests were reviewed and are normal. Pertinent negatives include no shortness of breath. Current antihyperlipidemic treatment includes diet change. The current treatment provides mild improvement of lipids. Risk factors for coronary artery disease include dyslipidemia and hypertension.  Benign Prostatic Hypertrophy  This is a chronic problem. The current episode started more than 1 year ago. The problem has been resolved since onset. Irritative symptoms do not include frequency or nocturia. Obstructive symptoms do not include dribbling, an intermittent stream or straining. Past treatments include nothing.      Review of Systems  Respiratory: Negative for shortness of breath.   Genitourinary: Negative for frequency  and nocturia.  Neurological: Negative for headaches.  All other systems reviewed and are negative.      Objective:   Physical Exam  Constitutional: He is oriented to person, place, and time. He appears well-developed and well-nourished. No distress.  HENT:  Head: Normocephalic.  Right Ear: External ear normal.  Left Ear: External ear normal.  Nose: Nose normal.  Mouth/Throat: Oropharynx is clear and moist.  Eyes: Pupils are equal, round, and reactive to light. Right eye exhibits no discharge. Left eye exhibits no discharge.  Neck: Normal range of motion. Neck supple. No thyromegaly present.  Cardiovascular: Normal rate, regular rhythm, normal heart sounds and intact distal pulses.  No murmur heard. Pulmonary/Chest: Effort normal and breath sounds normal. No respiratory distress. He has no wheezes.  Abdominal: Soft. Bowel sounds are normal. He exhibits no distension. There is no tenderness.  Musculoskeletal: Normal range of motion. He exhibits no edema or tenderness.  Neurological: He is alert and oriented to person, place, and time.  Skin: Skin is warm and dry. No rash noted. No erythema.  Psychiatric: He has a normal mood and affect. His behavior is normal. Judgment and thought content normal.  Vitals reviewed.    BP 127/82   Pulse 83   Temp 98.3 F (36.8 C) (Oral)   Ht '5\' 10"'  (1.778 m)   Wt 179 lb 12.8 oz (81.6 kg)   BMI 25.80 kg/m      Assessment & Plan:  1. Annual physical exam - CBC with Differential/Platelet - Lipid panel - CMP14+EGFR - TSH - VITAMIN D 25 Hydroxy (Vit-D Deficiency, Fractures) -  PSA, total and free - Hepatitis C antibody  2. Essential hypertension - CBC with Differential/Platelet - CMP14+EGFR  3. Benign prostatic hyperplasia with nocturia - CBC with Differential/Platelet - CMP14+EGFR - PSA, total and free  4. Dyslipidemia  - CBC with Differential/Platelet - Lipid panel - CMP14+EGFR  5. Vitamin D deficiency - CBC with  Differential/Platelet - CMP14+EGFR - VITAMIN D 25 Hydroxy (Vit-D Deficiency, Fractures)  6. Encounter for hepatitis C virus screening test for high risk patient  - CMP14+EGFR - Hepatitis C antibody   Continue all meds Labs pending Health Maintenance reviewed Diet and exercise encouraged RTO 1 year   Evelina Dun, FNP

## 2017-12-18 LAB — CBC WITH DIFFERENTIAL/PLATELET
Basophils Absolute: 0 10*3/uL (ref 0.0–0.2)
Basos: 1 %
EOS (ABSOLUTE): 0.1 10*3/uL (ref 0.0–0.4)
Eos: 1 %
Hematocrit: 45.2 % (ref 37.5–51.0)
Hemoglobin: 15.1 g/dL (ref 13.0–17.7)
Immature Grans (Abs): 0 10*3/uL (ref 0.0–0.1)
Immature Granulocytes: 0 %
Lymphocytes Absolute: 1.5 10*3/uL (ref 0.7–3.1)
Lymphs: 34 %
MCH: 32.1 pg (ref 26.6–33.0)
MCHC: 33.4 g/dL (ref 31.5–35.7)
MCV: 96 fL (ref 79–97)
MONOS ABS: 0.5 10*3/uL (ref 0.1–0.9)
Monocytes: 12 %
NEUTROS ABS: 2.2 10*3/uL (ref 1.4–7.0)
Neutrophils: 52 %
PLATELETS: 261 10*3/uL (ref 150–379)
RBC: 4.71 x10E6/uL (ref 4.14–5.80)
RDW: 13.2 % (ref 12.3–15.4)
WBC: 4.3 10*3/uL (ref 3.4–10.8)

## 2017-12-18 LAB — PSA, TOTAL AND FREE
PROSTATE SPECIFIC AG, SERUM: 0.4 ng/mL (ref 0.0–4.0)
PSA, Free Pct: 27.5 %
PSA, Free: 0.11 ng/mL

## 2017-12-18 LAB — LIPID PANEL
CHOL/HDL RATIO: 3.8 ratio (ref 0.0–5.0)
Cholesterol, Total: 180 mg/dL (ref 100–199)
HDL: 47 mg/dL (ref >39)
LDL Calculated: 72 mg/dL (ref 0–99)
Triglycerides: 307 mg/dL — ABNORMAL HIGH (ref 0–149)
VLDL CHOLESTEROL CAL: 61 mg/dL — AB (ref 5–40)

## 2017-12-18 LAB — CMP14+EGFR
ALK PHOS: 74 IU/L (ref 39–117)
ALT: 18 IU/L (ref 0–44)
AST: 20 IU/L (ref 0–40)
Albumin/Globulin Ratio: 1.6 (ref 1.2–2.2)
Albumin: 4.6 g/dL (ref 3.5–5.5)
BILIRUBIN TOTAL: 0.3 mg/dL (ref 0.0–1.2)
BUN/Creatinine Ratio: 17 (ref 9–20)
BUN: 16 mg/dL (ref 6–24)
CHLORIDE: 102 mmol/L (ref 96–106)
CO2: 25 mmol/L (ref 20–29)
Calcium: 9.8 mg/dL (ref 8.7–10.2)
Creatinine, Ser: 0.94 mg/dL (ref 0.76–1.27)
GFR calc non Af Amer: 90 mL/min/{1.73_m2} (ref >59)
GFR, EST AFRICAN AMERICAN: 104 mL/min/{1.73_m2} (ref >59)
GLUCOSE: 105 mg/dL — AB (ref 65–99)
Globulin, Total: 2.9 g/dL (ref 1.5–4.5)
Potassium: 4.2 mmol/L (ref 3.5–5.2)
Sodium: 141 mmol/L (ref 134–144)
TOTAL PROTEIN: 7.5 g/dL (ref 6.0–8.5)

## 2017-12-18 LAB — VITAMIN D 25 HYDROXY (VIT D DEFICIENCY, FRACTURES): VIT D 25 HYDROXY: 33.6 ng/mL (ref 30.0–100.0)

## 2017-12-18 LAB — TSH: TSH: 1.74 u[IU]/mL (ref 0.450–4.500)

## 2017-12-18 LAB — HEPATITIS C ANTIBODY: Hep C Virus Ab: <0.1 s/co ratio (ref 0.0–0.9)

## 2017-12-20 ENCOUNTER — Other Ambulatory Visit: Payer: Self-pay | Admitting: Family

## 2017-12-29 DIAGNOSIS — N401 Enlarged prostate with lower urinary tract symptoms: Secondary | ICD-10-CM | POA: Diagnosis not present

## 2017-12-29 DIAGNOSIS — N200 Calculus of kidney: Secondary | ICD-10-CM | POA: Diagnosis not present

## 2018-01-31 DIAGNOSIS — N200 Calculus of kidney: Secondary | ICD-10-CM | POA: Diagnosis not present

## 2018-01-31 DIAGNOSIS — N201 Calculus of ureter: Secondary | ICD-10-CM | POA: Diagnosis not present

## 2018-01-31 DIAGNOSIS — I10 Essential (primary) hypertension: Secondary | ICD-10-CM | POA: Diagnosis not present

## 2018-01-31 DIAGNOSIS — I700 Atherosclerosis of aorta: Secondary | ICD-10-CM | POA: Diagnosis not present

## 2018-01-31 DIAGNOSIS — R1030 Lower abdominal pain, unspecified: Secondary | ICD-10-CM | POA: Diagnosis not present

## 2018-01-31 DIAGNOSIS — N202 Calculus of kidney with calculus of ureter: Secondary | ICD-10-CM | POA: Diagnosis not present

## 2018-03-16 ENCOUNTER — Other Ambulatory Visit: Payer: Self-pay | Admitting: Family

## 2018-04-07 ENCOUNTER — Other Ambulatory Visit: Payer: Self-pay | Admitting: Urology

## 2018-04-07 DIAGNOSIS — N201 Calculus of ureter: Secondary | ICD-10-CM | POA: Diagnosis not present

## 2018-04-10 ENCOUNTER — Encounter (HOSPITAL_COMMUNITY): Payer: Self-pay | Admitting: *Deleted

## 2018-04-16 ENCOUNTER — Other Ambulatory Visit: Payer: Self-pay

## 2018-04-16 ENCOUNTER — Ambulatory Visit (HOSPITAL_COMMUNITY)
Admission: RE | Admit: 2018-04-16 | Discharge: 2018-04-16 | Disposition: A | Discharge status: Home / Self Care | Payer: 59 | Source: Other Acute Inpatient Hospital | Attending: Urology | Admitting: Urology

## 2018-04-16 ENCOUNTER — Ambulatory Visit (HOSPITAL_COMMUNITY): Payer: 59

## 2018-04-16 ENCOUNTER — Encounter (HOSPITAL_COMMUNITY)
Admission: RE | Disposition: A | Discharge status: Home / Self Care | Payer: Self-pay | Source: Other Acute Inpatient Hospital | Attending: Urology

## 2018-04-16 ENCOUNTER — Encounter (HOSPITAL_COMMUNITY): Payer: Self-pay | Admitting: *Deleted

## 2018-04-16 DIAGNOSIS — N201 Calculus of ureter: Secondary | ICD-10-CM | POA: Diagnosis present

## 2018-04-16 DIAGNOSIS — Z79899 Other long term (current) drug therapy: Secondary | ICD-10-CM | POA: Insufficient documentation

## 2018-04-16 DIAGNOSIS — N529 Male erectile dysfunction, unspecified: Secondary | ICD-10-CM | POA: Insufficient documentation

## 2018-04-16 DIAGNOSIS — Z7982 Long term (current) use of aspirin: Secondary | ICD-10-CM | POA: Insufficient documentation

## 2018-04-16 DIAGNOSIS — I10 Essential (primary) hypertension: Secondary | ICD-10-CM | POA: Diagnosis not present

## 2018-04-16 HISTORY — PX: EXTRACORPOREAL SHOCK WAVE LITHOTRIPSY: SHX1557

## 2018-04-16 HISTORY — DX: Personal history of urinary calculi: Z87.442

## 2018-04-16 SURGERY — LITHOTRIPSY, ESWL
Anesthesia: LOCAL | Laterality: Left

## 2018-04-16 MED ORDER — DIAZEPAM 5 MG PO TABS
10.0000 mg | ORAL_TABLET | ORAL | Status: AC
  Administered 2018-04-16: 10 mg via ORAL
  Filled 2018-04-16: qty 2

## 2018-04-16 MED ORDER — DIPHENHYDRAMINE HCL 25 MG PO CAPS
25.0000 mg | ORAL_CAPSULE | ORAL | Status: AC
  Administered 2018-04-16: 25 mg via ORAL
  Filled 2018-04-16: qty 1

## 2018-04-16 MED ORDER — SODIUM CHLORIDE 0.9 % IV SOLN
INTRAVENOUS | Status: DC
  Administered 2018-04-16: 10:00:00 via INTRAVENOUS

## 2018-04-16 MED ORDER — CIPROFLOXACIN HCL 500 MG PO TABS
500.0000 mg | ORAL_TABLET | ORAL | Status: AC
  Administered 2018-04-16: 500 mg via ORAL
  Filled 2018-04-16: qty 1

## 2018-04-16 NOTE — Discharge Instructions (Signed)
Lithotripsy, Care After °This sheet gives you information about how to care for yourself after your procedure. Your health care provider may also give you more specific instructions. If you have problems or questions, contact your health care provider. °What can I expect after the procedure? °After the procedure, it is common to have: °· Some blood in your urine. This should only last for a few days. °· Soreness in your back, sides, or upper abdomen for a few days. °· Blotches or bruises on your back where the pressure wave entered the skin. °· Pain, discomfort, or nausea when pieces (fragments) of the kidney stone move through the tube that carries urine from the kidney to the bladder (ureter). Stone fragments may pass soon after the procedure, but they may continue to pass for up to 4-8 weeks. °? If you have severe pain or nausea, contact your health care provider. This may be caused by a large stone that was not broken up, and this may mean that you need more treatment. °· Some pain or discomfort during urination. °· Some pain or discomfort in the lower abdomen or (in men) at the base of the penis. ° °Follow these instructions at home: °Medicines °· Take over-the-counter and prescription medicines only as told by your health care provider. °· If you were prescribed an antibiotic medicine, take it as told by your health care provider. Do not stop taking the antibiotic even if you start to feel better. °· Do not drive for 24 hours if you were given a medicine to help you relax (sedative). °· Do not drive or use heavy machinery while taking prescription pain medicine. °Eating and drinking °· Drink enough water and fluids to keep your urine clear or pale yellow. This helps any remaining pieces of the stone to pass. It can also help prevent new stones from forming. °· Eat plenty of fresh fruits and vegetables. °· Follow instructions from your health care provider about eating and drinking restrictions. You may be  instructed: °? To reduce how much salt (sodium) you eat or drink. Check ingredients and nutrition facts on packaged foods and beverages. °? To reduce how much meat you eat. °· Eat the recommended amount of calcium for your age and gender. Ask your health care provider how much calcium you should have. °General instructions °· Get plenty of rest. °· Most people can resume normal activities 1-2 days after the procedure. Ask your health care provider what activities are safe for you. °· If directed, strain all urine through the strainer that was provided by your health care provider. °? Keep all fragments for your health care provider to see. Any stones that are found may be sent to a medical lab for examination. The stone may be as small as a grain of salt. °· Keep all follow-up visits as told by your health care provider. This is important. °Contact a health care provider if: °· You have pain that is severe or does not get better with medicine. °· You have nausea that is severe or does not go away. °· You have blood in your urine longer than your health care provider told you to expect. °· You have more blood in your urine. °· You have pain during urination that does not go away. °· You urinate more frequently than usual and this does not go away. °· You develop a rash or any other possible signs of an allergic reaction. °Get help right away if: °· You have severe pain in   your back, sides, or upper abdomen. °· You have severe pain while urinating. °· Your urine is very dark red. °· You have blood in your stool (feces). °· You cannot pass any urine at all. °· You feel a strong urge to urinate after emptying your bladder. °· You have a fever or chills. °· You develop shortness of breath, difficulty breathing, or chest pain. °· You have severe nausea that leads to persistent vomiting. °· You faint. °Summary °· After this procedure, it is common to have some pain, discomfort, or nausea when pieces (fragments) of the  kidney stone move through the tube that carries urine from the kidney to the bladder (ureter). If this pain or nausea is severe, however, you should contact your health care provider. °· Most people can resume normal activities 1-2 days after the procedure. Ask your health care provider what activities are safe for you. °· Drink enough water and fluids to keep your urine clear or pale yellow. This helps any remaining pieces of the stone to pass, and it can help prevent new stones from forming. °· If directed, strain your urine and keep all fragments for your health care provider to see. Fragments or stones may be as small as a grain of salt. °· Get help right away if you have severe pain in your back, sides, or upper abdomen or have severe pain while urinating. °This information is not intended to replace advice given to you by your health care provider. Make sure you discuss any questions you have with your health care provider. °Document Released: 10/13/2007 Document Revised: 08/14/2016 Document Reviewed: 08/14/2016 °Elsevier Interactive Patient Education © 2018 Elsevier Inc. ° °

## 2018-05-04 DIAGNOSIS — N201 Calculus of ureter: Secondary | ICD-10-CM | POA: Diagnosis not present

## 2018-05-10 ENCOUNTER — Other Ambulatory Visit: Payer: Self-pay | Admitting: Family

## 2018-05-10 DIAGNOSIS — I10 Essential (primary) hypertension: Secondary | ICD-10-CM

## 2018-05-11 NOTE — Telephone Encounter (Signed)
Ov 12/17/17 RTC 1yr 

## 2018-05-26 DIAGNOSIS — M25562 Pain in left knee: Secondary | ICD-10-CM | POA: Diagnosis not present

## 2018-06-13 DIAGNOSIS — R1031 Right lower quadrant pain: Secondary | ICD-10-CM | POA: Diagnosis not present

## 2018-06-13 DIAGNOSIS — R1111 Vomiting without nausea: Secondary | ICD-10-CM | POA: Diagnosis not present

## 2018-06-13 DIAGNOSIS — Z7982 Long term (current) use of aspirin: Secondary | ICD-10-CM | POA: Diagnosis not present

## 2018-06-13 DIAGNOSIS — I10 Essential (primary) hypertension: Secondary | ICD-10-CM | POA: Diagnosis not present

## 2018-06-13 DIAGNOSIS — N132 Hydronephrosis with renal and ureteral calculous obstruction: Secondary | ICD-10-CM | POA: Diagnosis not present

## 2018-06-13 DIAGNOSIS — N200 Calculus of kidney: Secondary | ICD-10-CM | POA: Diagnosis not present

## 2018-06-29 DIAGNOSIS — N202 Calculus of kidney with calculus of ureter: Secondary | ICD-10-CM | POA: Diagnosis not present

## 2018-07-27 ENCOUNTER — Encounter (HOSPITAL_COMMUNITY): Payer: Self-pay | Admitting: Urology

## 2018-10-07 DIAGNOSIS — I639 Cerebral infarction, unspecified: Secondary | ICD-10-CM

## 2018-10-07 DIAGNOSIS — Z66 Do not resuscitate: Secondary | ICD-10-CM | POA: Diagnosis present

## 2018-10-07 HISTORY — DX: Cerebral infarction, unspecified: I63.9

## 2018-10-28 DIAGNOSIS — N200 Calculus of kidney: Secondary | ICD-10-CM | POA: Diagnosis not present

## 2018-12-24 ENCOUNTER — Other Ambulatory Visit: Payer: Self-pay | Admitting: Family

## 2018-12-24 DIAGNOSIS — I10 Essential (primary) hypertension: Secondary | ICD-10-CM

## 2018-12-25 ENCOUNTER — Other Ambulatory Visit: Payer: Self-pay

## 2018-12-25 ENCOUNTER — Ambulatory Visit: Payer: 59 | Admitting: Family

## 2018-12-25 ENCOUNTER — Encounter: Payer: Self-pay | Admitting: Family

## 2018-12-25 VITALS — BP 127/81 | HR 94 | Temp 98.5°F | Ht 70.0 in | Wt 180.0 lb

## 2018-12-25 DIAGNOSIS — E559 Vitamin D deficiency, unspecified: Secondary | ICD-10-CM

## 2018-12-25 DIAGNOSIS — R351 Nocturia: Secondary | ICD-10-CM | POA: Diagnosis not present

## 2018-12-25 DIAGNOSIS — E785 Hyperlipidemia, unspecified: Secondary | ICD-10-CM | POA: Diagnosis not present

## 2018-12-25 DIAGNOSIS — I10 Essential (primary) hypertension: Secondary | ICD-10-CM

## 2018-12-25 DIAGNOSIS — Z0001 Encounter for general adult medical examination with abnormal findings: Secondary | ICD-10-CM

## 2018-12-25 DIAGNOSIS — Z0000 Encounter for general adult medical examination without abnormal findings: Secondary | ICD-10-CM | POA: Diagnosis not present

## 2018-12-25 DIAGNOSIS — N401 Enlarged prostate with lower urinary tract symptoms: Secondary | ICD-10-CM

## 2018-12-25 MED ORDER — AMLODIPINE BESY-BENAZEPRIL HCL 5-40 MG PO CAPS: 1.0000 | ORAL_CAPSULE | Freq: Every day | ORAL | 3 refills | Status: DC

## 2018-12-25 MED ORDER — TAMSULOSIN HCL 0.4 MG PO CAPS: 0.4000 mg | ORAL_CAPSULE | Freq: Every day | ORAL | 3 refills | Status: DC

## 2018-12-25 NOTE — Progress Notes (Signed)
Subjective:    Patient ID: [name], male    DOB: 05-07-1961, 58 y.o.   MRN: [id number]  Chief Complaint  Patient presents with  . Medical Management of Chronic Issues    needs refills   Pt presents to the office today for CPE. PT is followed by Urologists annually for hx of kidney stones and family hx of prostate cancer.  Hypertension  This is a chronic problem. The current episode started more than 1 year ago. The problem has been resolved since onset. The problem is controlled. Pertinent negatives include no headaches, peripheral edema or shortness of breath. Risk factors for coronary artery disease include dyslipidemia and male gender. The current treatment provides moderate improvement. There is no history of kidney disease or heart failure.  Benign Prostatic Hypertrophy  This is a chronic problem. The problem has been waxing and waning since onset.  Hyperlipidemia  This is a chronic problem. The current episode started more than 1 year ago. The problem is controlled. Recent lipid tests were reviewed and are normal. Pertinent negatives include no shortness of breath. Current antihyperlipidemic treatment includes diet change. The current treatment provides no improvement of lipids. Risk factors for coronary artery disease include dyslipidemia, male sex, hypertension and a sedentary lifestyle.      Review of Systems  Respiratory: Negative for shortness of breath.   Neurological: Negative for headaches.  All other systems reviewed and are negative.      Objective:   Physical Exam Vitals signs reviewed.  Constitutional:      General: He is not in acute distress.    Appearance: He is well-developed.  HENT:     Head: Normocephalic.     Right Ear: Tympanic membrane normal.     Left Ear: Tympanic membrane normal.  Eyes:     General:        Right eye: No discharge.        Left eye: No discharge.     Pupils: Pupils are equal, round, and reactive to light.  Neck:   Musculoskeletal: Normal range of motion and neck supple.     Thyroid: No thyromegaly.  Cardiovascular:     Rate and Rhythm: Normal rate and regular rhythm.     Heart sounds: Normal heart sounds. No murmur.  Pulmonary:     Effort: Pulmonary effort is normal. No respiratory distress.     Breath sounds: Normal breath sounds. No wheezing.  Abdominal:     General: Bowel sounds are normal. There is no distension.     Palpations: Abdomen is soft.     Tenderness: There is no abdominal tenderness.  Musculoskeletal: Normal range of motion.        General: No tenderness.  Skin:    General: Skin is warm and dry.     Findings: No erythema or rash.  Neurological:     Mental Status: He is alert and oriented to person, place, and time.     Cranial Nerves: No cranial nerve deficit.     Deep Tendon Reflexes: Reflexes are normal and symmetric.  Psychiatric:        Behavior: Behavior normal.        Thought Content: Thought content normal.        Judgment: Judgment normal.       BP 127/81   Pulse 94   Temp 98.5 F (36.9 C) (Oral)   Ht '5\' 10"'  (1.778 m)   Wt 180 lb (81.6 kg)   BMI 25.83 kg/m  Assessment & Plan:  [name] comes in today with chief complaint of Medical Management of Chronic Issues (needs refills) and Annual Exam   Diagnosis and orders addressed:  1. Annual physical exam - CMP14+EGFR - CBC with Differential/Platelet - Lipid panel - TSH - PSA, total and free - VITAMIN D 25 Hydroxy (Vit-D Deficiency, Fractures)  2. Essential hypertension - CMP14+EGFR - CBC with Differential/Platelet - amLODipine-benazepril (LOTREL) 5-40 MG capsule; Take 1 capsule by mouth daily.  Dispense: 90 capsule; Refill: 3  3. Benign prostatic hyperplasia with nocturia - CMP14+EGFR - CBC with Differential/Platelet - tamsulosin (FLOMAX) 0.4 MG CAPS capsule; Take 1 capsule (0.4 mg total) by mouth daily after supper.  Dispense: 90 capsule; Refill: 3  4. Dyslipidemia - CMP14+EGFR -  CBC with Differential/Platelet - Lipid panel  5. Vitamin D deficiency - CMP14+EGFR - CBC with Differential/Platelet - VITAMIN D 25 Hydroxy (Vit-D Deficiency, Fractures)   Labs pending Health Maintenance reviewed Diet and exercise encouraged  Follow up plan: 1 year    Evelina Dun, FNP

## 2018-12-25 NOTE — Patient Instructions (Signed)

## 2018-12-26 LAB — CBC WITH DIFFERENTIAL/PLATELET
BASOS: 1 %
Basophils Absolute: 0 10*3/uL (ref 0.0–0.2)
EOS (ABSOLUTE): 0.1 10*3/uL (ref 0.0–0.4)
Eos: 2 %
HEMATOCRIT: 42.1 % (ref 37.5–51.0)
HEMOGLOBIN: 15 g/dL (ref 13.0–17.7)
Immature Grans (Abs): 0 10*3/uL (ref 0.0–0.1)
Immature Granulocytes: 0 %
LYMPHS ABS: 1.5 10*3/uL (ref 0.7–3.1)
Lymphs: 31 %
MCH: 32.1 pg (ref 26.6–33.0)
MCHC: 35.6 g/dL (ref 31.5–35.7)
MCV: 90 fL (ref 79–97)
MONOCYTES: 16 %
MONOS ABS: 0.8 10*3/uL (ref 0.1–0.9)
NEUTROS ABS: 2.4 10*3/uL (ref 1.4–7.0)
Neutrophils: 50 %
Platelets: 286 10*3/uL (ref 150–450)
RBC: 4.67 x10E6/uL (ref 4.14–5.80)
RDW: 12.1 % (ref 11.6–15.4)
WBC: 4.7 10*3/uL (ref 3.4–10.8)

## 2018-12-26 LAB — CMP14+EGFR
ALK PHOS: 73 IU/L (ref 39–117)
ALT: 17 IU/L (ref 0–44)
AST: 17 IU/L (ref 0–40)
Albumin/Globulin Ratio: 1.8 (ref 1.2–2.2)
Albumin: 4.5 g/dL (ref 3.8–4.9)
BUN/Creatinine Ratio: 19 (ref 9–20)
BUN: 17 mg/dL (ref 6–24)
Bilirubin Total: 0.3 mg/dL (ref 0.0–1.2)
CO2: 21 mmol/L (ref 20–29)
CREATININE: 0.88 mg/dL (ref 0.76–1.27)
Calcium: 9.2 mg/dL (ref 8.7–10.2)
Chloride: 102 mmol/L (ref 96–106)
GFR calc Af Amer: 109 mL/min/{1.73_m2} (ref >59)
GFR calc non Af Amer: 95 mL/min/{1.73_m2} (ref >59)
GLOBULIN, TOTAL: 2.5 g/dL (ref 1.5–4.5)
GLUCOSE: 95 mg/dL (ref 65–99)
Potassium: 4.3 mmol/L (ref 3.5–5.2)
SODIUM: 140 mmol/L (ref 134–144)
Total Protein: 7 g/dL (ref 6.0–8.5)

## 2018-12-26 LAB — LIPID PANEL
CHOL/HDL RATIO: 4 ratio (ref 0.0–5.0)
Cholesterol, Total: 164 mg/dL (ref 100–199)
HDL: 41 mg/dL (ref >39)
LDL CALC: 69 mg/dL (ref 0–99)
TRIGLYCERIDES: 272 mg/dL — AB (ref 0–149)
VLDL Cholesterol Cal: 54 mg/dL — ABNORMAL HIGH (ref 5–40)

## 2018-12-26 LAB — PSA, TOTAL AND FREE
PROSTATE SPECIFIC AG, SERUM: 0.5 ng/mL (ref 0.0–4.0)
PSA FREE: 0.1 ng/mL
PSA, Free Pct: 20 %

## 2018-12-26 LAB — VITAMIN D 25 HYDROXY (VIT D DEFICIENCY, FRACTURES): Vit D, 25-Hydroxy: 26.4 ng/mL — ABNORMAL LOW (ref 30.0–100.0)

## 2018-12-26 LAB — TSH: TSH: 1.31 u[IU]/mL (ref 0.450–4.500)

## 2018-12-28 ENCOUNTER — Other Ambulatory Visit: Payer: Self-pay | Admitting: Family

## 2018-12-28 MED ORDER — ATORVASTATIN CALCIUM 20 MG PO TABS: 20.0000 mg | ORAL_TABLET | Freq: Every day | ORAL | 3 refills | Status: DC

## 2018-12-28 MED ORDER — VITAMIN D (ERGOCALCIFEROL) 1.25 MG (50000 UNIT) PO CAPS: 50000.0000 [IU] | ORAL_CAPSULE | ORAL | 0 refills | Status: DC

## 2019-07-28 ENCOUNTER — Inpatient Hospital Stay (HOSPITAL_COMMUNITY): Payer: 59 | Admitting: Anesthesiology

## 2019-07-28 ENCOUNTER — Inpatient Hospital Stay (HOSPITAL_COMMUNITY)
Admission: EM | Admit: 2019-07-28 | Discharge: 2019-09-01 | DRG: 956 | Disposition: A | Discharge status: Rehabilitation Facility | Payer: 59 | Attending: General Surgery | Admitting: General Surgery

## 2019-07-28 ENCOUNTER — Emergency Department (HOSPITAL_COMMUNITY): Payer: 59

## 2019-07-28 ENCOUNTER — Encounter (HOSPITAL_COMMUNITY): Admission: EM | Disposition: A | Discharge status: Rehabilitation Facility | Payer: Self-pay | Source: Home / Self Care

## 2019-07-28 ENCOUNTER — Other Ambulatory Visit: Payer: Self-pay

## 2019-07-28 ENCOUNTER — Inpatient Hospital Stay (HOSPITAL_COMMUNITY): Payer: 59

## 2019-07-28 DIAGNOSIS — S82891A Other fracture of right lower leg, initial encounter for closed fracture: Secondary | ICD-10-CM

## 2019-07-28 DIAGNOSIS — I639 Cerebral infarction, unspecified: Secondary | ICD-10-CM | POA: Diagnosis not present

## 2019-07-28 DIAGNOSIS — R578 Other shock: Secondary | ICD-10-CM | POA: Diagnosis present

## 2019-07-28 DIAGNOSIS — Z789 Other specified health status: Secondary | ICD-10-CM

## 2019-07-28 DIAGNOSIS — S52502A Unspecified fracture of the lower end of left radius, initial encounter for closed fracture: Secondary | ICD-10-CM

## 2019-07-28 DIAGNOSIS — S81011A Laceration without foreign body, right knee, initial encounter: Secondary | ICD-10-CM | POA: Diagnosis present

## 2019-07-28 DIAGNOSIS — G8918 Other acute postprocedural pain: Secondary | ICD-10-CM | POA: Diagnosis not present

## 2019-07-28 DIAGNOSIS — Z0189 Encounter for other specified special examinations: Secondary | ICD-10-CM

## 2019-07-28 DIAGNOSIS — R339 Retention of urine, unspecified: Secondary | ICD-10-CM | POA: Diagnosis not present

## 2019-07-28 DIAGNOSIS — S82841A Displaced bimalleolar fracture of right lower leg, initial encounter for closed fracture: Secondary | ICD-10-CM

## 2019-07-28 DIAGNOSIS — Z20828 Contact with and (suspected) exposure to other viral communicable diseases: Secondary | ICD-10-CM | POA: Diagnosis present

## 2019-07-28 DIAGNOSIS — R945 Abnormal results of liver function studies: Secondary | ICD-10-CM | POA: Diagnosis present

## 2019-07-28 DIAGNOSIS — R1312 Dysphagia, oropharyngeal phase: Secondary | ICD-10-CM | POA: Diagnosis not present

## 2019-07-28 DIAGNOSIS — I6329 Cerebral infarction due to unspecified occlusion or stenosis of other precerebral arteries: Secondary | ICD-10-CM | POA: Diagnosis not present

## 2019-07-28 DIAGNOSIS — Z7982 Long term (current) use of aspirin: Secondary | ICD-10-CM

## 2019-07-28 DIAGNOSIS — D72829 Elevated white blood cell count, unspecified: Secondary | ICD-10-CM | POA: Diagnosis not present

## 2019-07-28 DIAGNOSIS — N179 Acute kidney failure, unspecified: Secondary | ICD-10-CM

## 2019-07-28 DIAGNOSIS — I6350 Cerebral infarction due to unspecified occlusion or stenosis of unspecified cerebral artery: Secondary | ICD-10-CM | POA: Diagnosis not present

## 2019-07-28 DIAGNOSIS — M24561 Contracture, right knee: Secondary | ICD-10-CM | POA: Diagnosis not present

## 2019-07-28 DIAGNOSIS — R000 Tachycardia, unspecified: Secondary | ICD-10-CM

## 2019-07-28 DIAGNOSIS — Z09 Encounter for follow-up examination after completed treatment for conditions other than malignant neoplasm: Secondary | ICD-10-CM

## 2019-07-28 DIAGNOSIS — Z87442 Personal history of urinary calculi: Secondary | ICD-10-CM

## 2019-07-28 DIAGNOSIS — R918 Other nonspecific abnormal finding of lung field: Secondary | ICD-10-CM

## 2019-07-28 DIAGNOSIS — S72351A Displaced comminuted fracture of shaft of right femur, initial encounter for closed fracture: Secondary | ICD-10-CM

## 2019-07-28 DIAGNOSIS — E872 Acidosis: Secondary | ICD-10-CM | POA: Diagnosis not present

## 2019-07-28 DIAGNOSIS — R29810 Facial weakness: Secondary | ICD-10-CM | POA: Diagnosis not present

## 2019-07-28 DIAGNOSIS — Y92410 Unspecified street and highway as the place of occurrence of the external cause: Secondary | ICD-10-CM

## 2019-07-28 DIAGNOSIS — I471 Supraventricular tachycardia, unspecified: Secondary | ICD-10-CM

## 2019-07-28 DIAGNOSIS — R509 Fever, unspecified: Secondary | ICD-10-CM | POA: Diagnosis not present

## 2019-07-28 DIAGNOSIS — E871 Hypo-osmolality and hyponatremia: Secondary | ICD-10-CM | POA: Diagnosis not present

## 2019-07-28 DIAGNOSIS — D689 Coagulation defect, unspecified: Secondary | ICD-10-CM | POA: Diagnosis present

## 2019-07-28 DIAGNOSIS — S52592A Other fractures of lower end of left radius, initial encounter for closed fracture: Secondary | ICD-10-CM | POA: Diagnosis present

## 2019-07-28 DIAGNOSIS — E875 Hyperkalemia: Secondary | ICD-10-CM | POA: Diagnosis not present

## 2019-07-28 DIAGNOSIS — I6389 Other cerebral infarction: Secondary | ICD-10-CM

## 2019-07-28 DIAGNOSIS — M25569 Pain in unspecified knee: Secondary | ICD-10-CM

## 2019-07-28 DIAGNOSIS — R0603 Acute respiratory distress: Secondary | ICD-10-CM

## 2019-07-28 DIAGNOSIS — S36899A Unspecified injury of other intra-abdominal organs, initial encounter: Secondary | ICD-10-CM | POA: Diagnosis present

## 2019-07-28 DIAGNOSIS — Z419 Encounter for procedure for purposes other than remedying health state, unspecified: Secondary | ICD-10-CM

## 2019-07-28 DIAGNOSIS — G4700 Insomnia, unspecified: Secondary | ICD-10-CM | POA: Diagnosis not present

## 2019-07-28 DIAGNOSIS — Z79899 Other long term (current) drug therapy: Secondary | ICD-10-CM

## 2019-07-28 DIAGNOSIS — L8990 Pressure ulcer of unspecified site, unspecified stage: Secondary | ICD-10-CM | POA: Insufficient documentation

## 2019-07-28 DIAGNOSIS — A630 Anogenital (venereal) warts: Secondary | ICD-10-CM

## 2019-07-28 DIAGNOSIS — D696 Thrombocytopenia, unspecified: Secondary | ICD-10-CM | POA: Diagnosis present

## 2019-07-28 DIAGNOSIS — I10 Essential (primary) hypertension: Secondary | ICD-10-CM

## 2019-07-28 DIAGNOSIS — S42022A Displaced fracture of shaft of left clavicle, initial encounter for closed fracture: Secondary | ICD-10-CM

## 2019-07-28 DIAGNOSIS — M25473 Effusion, unspecified ankle: Secondary | ICD-10-CM

## 2019-07-28 DIAGNOSIS — S27322A Contusion of lung, bilateral, initial encounter: Secondary | ICD-10-CM | POA: Diagnosis present

## 2019-07-28 DIAGNOSIS — K625 Hemorrhage of anus and rectum: Secondary | ICD-10-CM | POA: Diagnosis not present

## 2019-07-28 DIAGNOSIS — R197 Diarrhea, unspecified: Secondary | ICD-10-CM | POA: Diagnosis not present

## 2019-07-28 DIAGNOSIS — S36521A Contusion of transverse colon, initial encounter: Secondary | ICD-10-CM | POA: Diagnosis present

## 2019-07-28 DIAGNOSIS — S42002A Fracture of unspecified part of left clavicle, initial encounter for closed fracture: Secondary | ICD-10-CM | POA: Diagnosis not present

## 2019-07-28 DIAGNOSIS — R471 Dysarthria and anarthria: Secondary | ICD-10-CM | POA: Diagnosis not present

## 2019-07-28 DIAGNOSIS — J9601 Acute respiratory failure with hypoxia: Secondary | ICD-10-CM | POA: Diagnosis not present

## 2019-07-28 DIAGNOSIS — T07XXXA Unspecified multiple injuries, initial encounter: Secondary | ICD-10-CM

## 2019-07-28 DIAGNOSIS — R0602 Shortness of breath: Secondary | ICD-10-CM

## 2019-07-28 DIAGNOSIS — T148XXA Other injury of unspecified body region, initial encounter: Secondary | ICD-10-CM

## 2019-07-28 DIAGNOSIS — M79642 Pain in left hand: Secondary | ICD-10-CM

## 2019-07-28 DIAGNOSIS — K7200 Acute and subacute hepatic failure without coma: Secondary | ICD-10-CM | POA: Diagnosis not present

## 2019-07-28 DIAGNOSIS — D62 Acute posthemorrhagic anemia: Secondary | ICD-10-CM

## 2019-07-28 DIAGNOSIS — M25562 Pain in left knee: Secondary | ICD-10-CM | POA: Diagnosis present

## 2019-07-28 DIAGNOSIS — R682 Dry mouth, unspecified: Secondary | ICD-10-CM | POA: Diagnosis not present

## 2019-07-28 DIAGNOSIS — R34 Anuria and oliguria: Secondary | ICD-10-CM | POA: Diagnosis not present

## 2019-07-28 DIAGNOSIS — J9690 Respiratory failure, unspecified, unspecified whether with hypoxia or hypercapnia: Secondary | ICD-10-CM

## 2019-07-28 DIAGNOSIS — Z8249 Family history of ischemic heart disease and other diseases of the circulatory system: Secondary | ICD-10-CM

## 2019-07-28 DIAGNOSIS — R0682 Tachypnea, not elsewhere classified: Secondary | ICD-10-CM

## 2019-07-28 DIAGNOSIS — N170 Acute kidney failure with tubular necrosis: Secondary | ICD-10-CM | POA: Diagnosis not present

## 2019-07-28 DIAGNOSIS — S36893A Laceration of other intra-abdominal organs, initial encounter: Secondary | ICD-10-CM | POA: Diagnosis present

## 2019-07-28 DIAGNOSIS — I6330 Cerebral infarction due to thrombosis of unspecified cerebral artery: Secondary | ICD-10-CM

## 2019-07-28 DIAGNOSIS — G479 Sleep disorder, unspecified: Secondary | ICD-10-CM | POA: Diagnosis not present

## 2019-07-28 DIAGNOSIS — I69391 Dysphagia following cerebral infarction: Secondary | ICD-10-CM | POA: Diagnosis not present

## 2019-07-28 DIAGNOSIS — S62102A Fracture of unspecified carpal bone, left wrist, initial encounter for closed fracture: Secondary | ICD-10-CM

## 2019-07-28 DIAGNOSIS — M24562 Contracture, left knee: Secondary | ICD-10-CM | POA: Diagnosis not present

## 2019-07-28 DIAGNOSIS — E870 Hyperosmolality and hypernatremia: Secondary | ICD-10-CM | POA: Diagnosis present

## 2019-07-28 DIAGNOSIS — Z452 Encounter for adjustment and management of vascular access device: Secondary | ICD-10-CM

## 2019-07-28 DIAGNOSIS — S7291XA Unspecified fracture of right femur, initial encounter for closed fracture: Secondary | ICD-10-CM

## 2019-07-28 DIAGNOSIS — M79606 Pain in leg, unspecified: Secondary | ICD-10-CM

## 2019-07-28 DIAGNOSIS — G9349 Other encephalopathy: Secondary | ICD-10-CM | POA: Diagnosis not present

## 2019-07-28 DIAGNOSIS — S83242A Other tear of medial meniscus, current injury, left knee, initial encounter: Secondary | ICD-10-CM | POA: Diagnosis present

## 2019-07-28 DIAGNOSIS — S36498A Other injury of other part of small intestine, initial encounter: Secondary | ICD-10-CM | POA: Diagnosis present

## 2019-07-28 DIAGNOSIS — R609 Edema, unspecified: Secondary | ICD-10-CM

## 2019-07-28 DIAGNOSIS — S3590XA Unspecified injury of unspecified blood vessel at abdomen, lower back and pelvis level, initial encounter: Secondary | ICD-10-CM

## 2019-07-28 DIAGNOSIS — F4321 Adjustment disorder with depressed mood: Secondary | ICD-10-CM | POA: Diagnosis not present

## 2019-07-28 DIAGNOSIS — S7290XA Unspecified fracture of unspecified femur, initial encounter for closed fracture: Secondary | ICD-10-CM

## 2019-07-28 DIAGNOSIS — F418 Other specified anxiety disorders: Secondary | ICD-10-CM | POA: Diagnosis not present

## 2019-07-28 DIAGNOSIS — R58 Hemorrhage, not elsewhere classified: Secondary | ICD-10-CM

## 2019-07-28 HISTORY — PX: LAPAROTOMY: SHX154

## 2019-07-28 HISTORY — PX: CLOSED REDUCTION WRIST FRACTURE: SHX1091

## 2019-07-28 HISTORY — PX: IRRIGATION AND DEBRIDEMENT KNEE: SHX5185

## 2019-07-28 HISTORY — PX: FEMUR IM NAIL: SHX1597

## 2019-07-28 HISTORY — PX: BOWEL RESECTION: SHX1257

## 2019-07-28 LAB — CBC
HCT: 41.3 % (ref 39.0–52.0)
Hemoglobin: 14.1 g/dL (ref 13.0–17.0)
MCH: 33 pg (ref 26.0–34.0)
MCHC: 34.1 g/dL (ref 30.0–36.0)
MCV: 96.7 fL (ref 80.0–100.0)
Platelets: 244 10*3/uL (ref 150–400)
RBC: 4.27 MIL/uL (ref 4.22–5.81)
RDW: 12.5 % (ref 11.5–15.5)
WBC: 7.2 10*3/uL (ref 4.0–10.5)
nRBC: 0 % (ref 0.0–0.2)

## 2019-07-28 LAB — CDS SEROLOGY

## 2019-07-28 LAB — COMPREHENSIVE METABOLIC PANEL
ALT: 59 U/L — ABNORMAL HIGH (ref 0–44)
AST: 75 U/L — ABNORMAL HIGH (ref 15–41)
Albumin: 3.7 g/dL (ref 3.5–5.0)
Alkaline Phosphatase: 52 U/L (ref 38–126)
Anion gap: 10 (ref 5–15)
BUN: 22 mg/dL — ABNORMAL HIGH (ref 6–20)
CO2: 20 mmol/L — ABNORMAL LOW (ref 22–32)
Calcium: 9 mg/dL (ref 8.9–10.3)
Chloride: 110 mmol/L (ref 98–111)
Creatinine, Ser: 1.01 mg/dL (ref 0.61–1.24)
GFR calc Af Amer: >60 mL/min (ref >60)
GFR calc non Af Amer: >60 mL/min (ref >60)
Glucose, Bld: 120 mg/dL — ABNORMAL HIGH (ref 70–99)
Potassium: 3.9 mmol/L (ref 3.5–5.1)
Sodium: 140 mmol/L (ref 135–145)
Total Bilirubin: 0.7 mg/dL (ref 0.3–1.2)
Total Protein: 6.4 g/dL — ABNORMAL LOW (ref 6.5–8.1)

## 2019-07-28 LAB — ABO/RH: ABO/RH(D): A POS

## 2019-07-28 LAB — I-STAT CHEM 8, ED
BUN: 26 mg/dL — ABNORMAL HIGH (ref 6–20)
Calcium, Ion: 1.07 mmol/L — ABNORMAL LOW (ref 1.15–1.40)
Chloride: 107 mmol/L (ref 98–111)
Creatinine, Ser: 0.9 mg/dL (ref 0.61–1.24)
Glucose, Bld: 117 mg/dL — ABNORMAL HIGH (ref 70–99)
HCT: 40 % (ref 39.0–52.0)
Hemoglobin: 13.6 g/dL (ref 13.0–17.0)
Potassium: 4.1 mmol/L (ref 3.5–5.1)
Sodium: 140 mmol/L (ref 135–145)
TCO2: 22 mmol/L (ref 22–32)

## 2019-07-28 LAB — LACTIC ACID, PLASMA: Lactic Acid, Venous: 2.9 mmol/L (ref 0.5–1.9)

## 2019-07-28 LAB — PROTIME-INR
INR: 1 (ref 0.8–1.2)
Prothrombin Time: 12.8 seconds (ref 11.4–15.2)

## 2019-07-28 LAB — SAMPLE TO BLOOD BANK

## 2019-07-28 LAB — PREPARE RBC (CROSSMATCH)

## 2019-07-28 LAB — SARS CORONAVIRUS 2 BY RT PCR (HOSPITAL ORDER, PERFORMED IN ~~LOC~~ HOSPITAL LAB): SARS Coronavirus 2: NEGATIVE

## 2019-07-28 LAB — ETHANOL: Alcohol, Ethyl (B): <10 mg/dL (ref <10)

## 2019-07-28 SURGERY — EXCISION, SMALL INTESTINE
Anesthesia: General | Site: Wrist | Laterality: Right

## 2019-07-28 MED ORDER — FENTANYL CITRATE (PF) 100 MCG/2ML IJ SOLN
50.0000 ug | INTRAMUSCULAR | Status: AC | PRN
  Administered 2019-07-28 (×2): 50 ug via INTRAVENOUS

## 2019-07-28 MED ORDER — FENTANYL CITRATE (PF) 250 MCG/5ML IJ SOLN
INTRAMUSCULAR | Status: DC | PRN
  Administered 2019-07-28: 100 ug via INTRAVENOUS
  Administered 2019-07-28 – 2019-07-29 (×2): 50 ug via INTRAVENOUS
  Administered 2019-07-29: 100 ug via INTRAVENOUS
  Administered 2019-07-29: 50 ug via INTRAVENOUS
  Administered 2019-07-29: 100 ug via INTRAVENOUS
  Administered 2019-07-29: 50 ug via INTRAVENOUS

## 2019-07-28 MED ORDER — KETAMINE HCL 50 MG/5ML IJ SOSY
0.3000 mg/kg | PREFILLED_SYRINGE | Freq: Once | INTRAMUSCULAR | Status: AC
  Administered 2019-07-28: 24 mg via INTRAVENOUS
  Filled 2019-07-28: qty 5

## 2019-07-28 MED ORDER — MIDAZOLAM HCL 2 MG/2ML IJ SOLN
INTRAMUSCULAR | Status: AC
  Filled 2019-07-28: qty 2

## 2019-07-28 MED ORDER — FENTANYL CITRATE (PF) 250 MCG/5ML IJ SOLN
INTRAMUSCULAR | Status: AC
  Filled 2019-07-28: qty 5

## 2019-07-28 MED ORDER — ONDANSETRON HCL 4 MG/2ML IJ SOLN
4.0000 mg | Freq: Once | INTRAMUSCULAR | Status: AC
  Administered 2019-07-28: 4 mg via INTRAVENOUS
  Filled 2019-07-28: qty 2

## 2019-07-28 MED ORDER — ALBUMIN HUMAN 5 % IV SOLN
INTRAVENOUS | Status: DC | PRN
  Administered 2019-07-28 – 2019-07-29 (×2): via INTRAVENOUS

## 2019-07-28 MED ORDER — PROPOFOL 10 MG/ML IV BOLUS
INTRAVENOUS | Status: DC | PRN
  Administered 2019-07-28: 110 mg via INTRAVENOUS

## 2019-07-28 MED ORDER — LACTATED RINGERS IV SOLN
INTRAVENOUS | Status: DC | PRN
  Administered 2019-07-28 – 2019-07-29 (×3): via INTRAVENOUS

## 2019-07-28 MED ORDER — IOHEXOL 300 MG/ML  SOLN
100.0000 mL | Freq: Once | INTRAMUSCULAR | Status: AC | PRN
  Administered 2019-07-28: 100 mL via INTRAVENOUS

## 2019-07-28 MED ORDER — PHENYLEPHRINE HCL (PRESSORS) 10 MG/ML IV SOLN
INTRAVENOUS | Status: DC | PRN
  Administered 2019-07-28: 80 ug via INTRAVENOUS
  Administered 2019-07-29: 40 ug via INTRAVENOUS
  Administered 2019-07-29: 80 ug via INTRAVENOUS

## 2019-07-28 MED ORDER — MIDAZOLAM HCL 5 MG/5ML IJ SOLN
INTRAMUSCULAR | Status: DC | PRN
  Administered 2019-07-28 (×2): 1 mg via INTRAVENOUS
  Administered 2019-07-28: 2 mg via INTRAVENOUS

## 2019-07-28 MED ORDER — SUCCINYLCHOLINE CHLORIDE 20 MG/ML IJ SOLN
INTRAMUSCULAR | Status: DC | PRN
  Administered 2019-07-28: 120 mg via INTRAVENOUS

## 2019-07-28 MED ORDER — HYDROMORPHONE HCL 1 MG/ML IJ SOLN
1.0000 mg | Freq: Once | INTRAMUSCULAR | Status: AC
  Administered 2019-07-28: 1 mg via INTRAVENOUS
  Filled 2019-07-28: qty 1

## 2019-07-28 MED ORDER — PROPOFOL 10 MG/ML IV BOLUS
INTRAVENOUS | Status: AC
  Filled 2019-07-28: qty 20

## 2019-07-28 MED ORDER — PIPERACILLIN-TAZOBACTAM 3.375 G IVPB 30 MIN
3.3750 g | Freq: Once | INTRAVENOUS | Status: AC
  Administered 2019-07-28: 3.375 g via INTRAVENOUS
  Filled 2019-07-28: qty 50

## 2019-07-28 MED ORDER — FENTANYL CITRATE (PF) 100 MCG/2ML IJ SOLN
INTRAMUSCULAR | Status: AC
  Administered 2019-07-28: 50 ug via INTRAVENOUS
  Filled 2019-07-28: qty 2

## 2019-07-28 MED ORDER — LIDOCAINE HCL (CARDIAC) PF 100 MG/5ML IV SOSY
PREFILLED_SYRINGE | INTRAVENOUS | Status: DC | PRN
  Administered 2019-07-28: 100 mg via INTRATRACHEAL

## 2019-07-28 MED ORDER — ROCURONIUM BROMIDE 100 MG/10ML IV SOLN
INTRAVENOUS | Status: DC | PRN
  Administered 2019-07-28 – 2019-07-29 (×2): 50 mg via INTRAVENOUS
  Administered 2019-07-29: 40 mg via INTRAVENOUS
  Administered 2019-07-29: 10 mg via INTRAVENOUS

## 2019-07-28 MED ORDER — SODIUM CHLORIDE 0.9 % IV SOLN
INTRAVENOUS | Status: DC | PRN
  Administered 2019-07-28: 50 ug/min via INTRAVENOUS

## 2019-07-28 MED ORDER — 0.9 % SODIUM CHLORIDE (POUR BTL) OPTIME
TOPICAL | Status: DC | PRN
  Administered 2019-07-28: 4000 mL

## 2019-07-28 SURGICAL SUPPLY — 102 items
APL PRP STRL LF DISP 70% ISPRP (MISCELLANEOUS) ×4
BANDAGE ELASTIC 3 VELCRO ST LF (GAUZE/BANDAGES/DRESSINGS) ×1 IMPLANT
BIT DRILL LONG 4.0 (BIT) IMPLANT
BIT DRILL SHORT 4.0 (BIT) IMPLANT
BLADE CLIPPER SURG (BLADE) ×1 IMPLANT
BLADE SURG 15 STRL LF DISP TIS (BLADE) ×4 IMPLANT
BLADE SURG 15 STRL SS (BLADE) ×5
BNDG CMPR MED 10X6 ELC LF (GAUZE/BANDAGES/DRESSINGS) ×4
BNDG CMPR MED 15X6 ELC VLCR LF (GAUZE/BANDAGES/DRESSINGS) ×4
BNDG ELASTIC 4X5.8 VLCR STR LF (GAUZE/BANDAGES/DRESSINGS) ×1 IMPLANT
BNDG ELASTIC 6X10 VLCR STRL LF (GAUZE/BANDAGES/DRESSINGS) ×1 IMPLANT
BNDG ELASTIC 6X15 VLCR STRL LF (GAUZE/BANDAGES/DRESSINGS) ×1 IMPLANT
CANISTER SUCT 3000ML PPV (MISCELLANEOUS) ×5 IMPLANT
CHLORAPREP W/TINT 26 (MISCELLANEOUS) ×5 IMPLANT
COVER PERINEAL POST (MISCELLANEOUS) ×5 IMPLANT
COVER SURGICAL LIGHT HANDLE (MISCELLANEOUS) ×10 IMPLANT
COVER WAND RF STERILE (DRAPES) ×10 IMPLANT
DRAPE HALF SHEET 40X57 (DRAPES) ×1 IMPLANT
DRAPE INCISE IOBAN 66X45 STRL (DRAPES) ×1 IMPLANT
DRAPE LAPAROSCOPIC ABDOMINAL (DRAPES) ×5 IMPLANT
DRAPE ORTHO SPLIT 77X108 STRL (DRAPES)
DRAPE STERI IOBAN 125X83 (DRAPES) ×5 IMPLANT
DRAPE SURG ORHT 6 SPLT 77X108 (DRAPES) IMPLANT
DRAPE WARM FLUID 44X44 (DRAPES) ×5 IMPLANT
DRILL BIT LONG 4.0 (BIT) ×5
DRILL BIT SHORT 4.0 (BIT) ×10
DRSG AQUACEL AG ADV 3.5X 6 (GAUZE/BANDAGES/DRESSINGS) ×2 IMPLANT
DRSG MEPILEX BORDER 4X4 (GAUZE/BANDAGES/DRESSINGS) IMPLANT
DRSG MEPILEX BORDER 4X8 (GAUZE/BANDAGES/DRESSINGS) ×5 IMPLANT
DRSG OPSITE POSTOP 4X10 (GAUZE/BANDAGES/DRESSINGS) IMPLANT
DRSG OPSITE POSTOP 4X8 (GAUZE/BANDAGES/DRESSINGS) IMPLANT
DURAPREP 26ML APPLICATOR (WOUND CARE) ×6 IMPLANT
ELECT BLADE 6.5 EXT (BLADE) IMPLANT
ELECT CAUTERY BLADE 6.4 (BLADE) ×10 IMPLANT
ELECT REM PT RETURN 9FT ADLT (ELECTROSURGICAL) ×10
ELECTRODE REM PT RTRN 9FT ADLT (ELECTROSURGICAL) ×8 IMPLANT
FACESHIELD WRAPAROUND (MASK) ×5 IMPLANT
FACESHIELD WRAPAROUND OR TEAM (MASK) ×4 IMPLANT
GAUZE SPONGE 4X4 12PLY STRL (GAUZE/BANDAGES/DRESSINGS) ×1 IMPLANT
GAUZE XEROFORM 5X9 LF (GAUZE/BANDAGES/DRESSINGS) ×5 IMPLANT
GLOVE BIO SURGEON STRL SZ7 (GLOVE) ×6 IMPLANT
GLOVE BIOGEL PI IND STRL 7.0 (GLOVE) IMPLANT
GLOVE BIOGEL PI IND STRL 7.5 (GLOVE) ×4 IMPLANT
GLOVE BIOGEL PI IND STRL 8 (GLOVE) ×4 IMPLANT
GLOVE BIOGEL PI INDICATOR 7.0 (GLOVE) ×1
GLOVE BIOGEL PI INDICATOR 7.5 (GLOVE) ×4
GLOVE BIOGEL PI INDICATOR 8 (GLOVE) ×2
GLOVE ECLIPSE 8.0 STRL XLNG CF (GLOVE) ×3 IMPLANT
GLOVE INDICATOR 7.5 STRL GRN (GLOVE) ×1 IMPLANT
GLOVE SURG ORTHO 8.0 STRL STRW (GLOVE) ×5 IMPLANT
GOWN STRL REUS W/ TWL LRG LVL3 (GOWN DISPOSABLE) ×12 IMPLANT
GOWN STRL REUS W/ TWL XL LVL3 (GOWN DISPOSABLE) IMPLANT
GOWN STRL REUS W/TWL LRG LVL3 (GOWN DISPOSABLE) ×15
GOWN STRL REUS W/TWL XL LVL3 (GOWN DISPOSABLE)
GUIDE PIN 3.2X343 (PIN) ×4
GUIDE PIN 3.2X343MM (PIN) ×5
GUIDE ROD 3.0 (MISCELLANEOUS) ×5
HANDLE SUCTION POOLE (INSTRUMENTS) ×4 IMPLANT
KIT BASIN OR (CUSTOM PROCEDURE TRAY) ×10 IMPLANT
KIT TURNOVER KIT B (KITS) ×10 IMPLANT
LIGASURE IMPACT 36 18CM CVD LR (INSTRUMENTS) ×1 IMPLANT
MANIFOLD NEPTUNE II (INSTRUMENTS) ×5 IMPLANT
NAIL METATAN 11.5X40 RT (Nail) ×1 IMPLANT
NS IRRIG 1000ML POUR BTL (IV SOLUTION) ×17 IMPLANT
PACK GENERAL/GYN (CUSTOM PROCEDURE TRAY) ×10 IMPLANT
PAD ARMBOARD 7.5X6 YLW CONV (MISCELLANEOUS) ×15 IMPLANT
PAD CAST 4YDX4 CTTN HI CHSV (CAST SUPPLIES) IMPLANT
PADDING CAST COTTON 4X4 STRL (CAST SUPPLIES) ×5
PENCIL SMOKE EVACUATOR (MISCELLANEOUS) ×5 IMPLANT
PIN GUIDE 3.2X343MM (PIN) IMPLANT
RELOAD PROXIMATE 75MM BLUE (ENDOMECHANICALS) ×5 IMPLANT
RELOAD STAPLE 75 3.8 BLU REG (ENDOMECHANICALS) IMPLANT
ROD GUIDE 3.0 (MISCELLANEOUS) IMPLANT
SCREW TRIGEN LOW PROF 5.0X47.5 (Screw) ×1 IMPLANT
SCREW TRIGEN LOW PROF 5.0X62.5 (Screw) ×1 IMPLANT
SCREW TRIGEN LOW PROF 5.0X77.5 (Screw) ×1 IMPLANT
SLING ARM FOAM STRAP LRG (SOFTGOODS) ×1 IMPLANT
SPECIMEN JAR LARGE (MISCELLANEOUS) IMPLANT
SPLINT PLASTER CAST XFAST 4X15 (CAST SUPPLIES) IMPLANT
SPLINT PLASTER XTRA FAST SET 4 (CAST SUPPLIES) ×1
SPONGE LAP 18X18 RF (DISPOSABLE) ×4 IMPLANT
SPONGE LAP 4X18 RFD (DISPOSABLE) IMPLANT
STAPLER GUN LINEAR PROX 60 (STAPLE) ×1 IMPLANT
STAPLER PROXIMATE 75MM BLUE (STAPLE) ×1 IMPLANT
STAPLER VISISTAT 35W (STAPLE) ×10 IMPLANT
SUCTION POOLE HANDLE (INSTRUMENTS) ×5
SUT ETHILON 2 0 FS 18 (SUTURE) IMPLANT
SUT ETHILON 3 0 PS 1 (SUTURE) ×4 IMPLANT
SUT PDS AB 1 TP1 96 (SUTURE) ×10 IMPLANT
SUT SILK 0 TIES 10X30 (SUTURE) ×1 IMPLANT
SUT SILK 2 0 SH CR/8 (SUTURE) ×9 IMPLANT
SUT SILK 2 0 TIES 10X30 (SUTURE) ×5 IMPLANT
SUT SILK 3 0 SH CR/8 (SUTURE) ×5 IMPLANT
SUT SILK 3 0 TIES 10X30 (SUTURE) ×5 IMPLANT
SUT VIC AB 2-0 CTB1 (SUTURE) ×5 IMPLANT
SUT VIC AB 3-0 SH 18 (SUTURE) IMPLANT
TAPE STRIPS DRAPE STRL (GAUZE/BANDAGES/DRESSINGS) IMPLANT
TOWEL GREEN STERILE (TOWEL DISPOSABLE) ×10 IMPLANT
TOWEL GREEN STERILE FF (TOWEL DISPOSABLE) ×5 IMPLANT
TRAY FOLEY MTR SLVR 16FR STAT (SET/KITS/TRAYS/PACK) ×1 IMPLANT
WATER STERILE IRR 1000ML POUR (IV SOLUTION) ×5 IMPLANT
YANKAUER SUCT BULB TIP NO VENT (SUCTIONS) IMPLANT

## 2019-07-28 NOTE — Anesthesia Procedure Notes (Signed)
Procedure Name: Intubation Date/Time: 07/28/2019 11:07 PM Performed by: Clovis Cao, CRNA Pre-anesthesia Checklist: Patient identified, Emergency Drugs available, Suction available, Patient being monitored and Timeout performed Patient Re-evaluated:Patient Re-evaluated prior to induction Oxygen Delivery Method: Circle system utilized Preoxygenation: Pre-oxygenation with 100% oxygen Induction Type: IV induction, Rapid sequence and Cricoid Pressure applied Laryngoscope Size: Miller and 2 Grade View: Grade I Tube type: Oral Tube size: 7.5 mm Number of attempts: 1 Airway Equipment and Method: Stylet Placement Confirmation: ETT inserted through vocal cords under direct vision,  positive ETCO2 and breath sounds checked- equal and bilateral Secured at: 23 cm Tube secured with: Tape Dental Injury: Teeth and Oropharynx as per pre-operative assessment

## 2019-07-28 NOTE — ED Notes (Signed)
Wife at bedside.
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
## 2019-07-28 NOTE — Anesthesia Preprocedure Evaluation (Addendum)
Anesthesia Evaluation  Patient identified by MRN, date of birth, ID band Patient awake    Reviewed: Allergy & Precautions, NPO status , Patient's Chart, lab work & pertinent test results  Airway Mallampati: I  TM Distance: >3 FB Neck ROM: Full    Dental   Pulmonary    Pulmonary exam normal        Cardiovascular Normal cardiovascular exam     Neuro/Psych    GI/Hepatic   Endo/Other    Renal/GU      Musculoskeletal   Abdominal   Peds  Hematology   Anesthesia Other Findings   Reproductive/Obstetrics                             Anesthesia Physical Anesthesia Plan  ASA: II and emergent  Anesthesia Plan: General   Post-op Pain Management:    Induction: Intravenous, Rapid sequence and Cricoid pressure planned  PONV Risk Score and Plan: 2 and Ondansetron, Treatment may vary due to age or medical condition and Midazolam  Airway Management Planned: Oral ETT  Additional Equipment:   Intra-op Plan:   Post-operative Plan: Extubation in OR and Possible Post-op intubation/ventilation  Informed Consent: I have reviewed the patients History and Physical, chart, labs and discussed the procedure including the risks, benefits and alternatives for the proposed anesthesia with the patient or authorized representative who has indicated his/her understanding and acceptance.       Plan Discussed with: CRNA and Surgeon  Anesthesia Plan Comments:        Anesthesia Quick Evaluation
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
## 2019-07-28 NOTE — ED Notes (Signed)
Pt in xray

## 2019-07-28 NOTE — Progress Notes (Signed)
Orthopedic Tech Progress Note Patient Details:  [name] [facility] Oct 22, 1960 PM:[id number]  Musculoskeletal Traction Type of Traction: Bucks Skin Traction Traction Location: RLE Traction Weight: 10 lbs       Melony Overly T 07/28/2019, 8:21 PM

## 2019-07-28 NOTE — ED Notes (Signed)
Pt returned from CT °
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
## 2019-07-28 NOTE — ED Notes (Signed)
Puncture wound noted to left knee, wound clean with saline and covered with gauze
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
## 2019-07-28 NOTE — ED Notes (Signed)
Trauma MD at bedside.

## 2019-07-28 NOTE — ED Notes (Signed)
Ortho PA at bedside.  

## 2019-07-29 ENCOUNTER — Inpatient Hospital Stay (HOSPITAL_COMMUNITY): Payer: 59

## 2019-07-29 ENCOUNTER — Encounter (HOSPITAL_COMMUNITY): Payer: Self-pay | Admitting: Anesthesiology

## 2019-07-29 ENCOUNTER — Inpatient Hospital Stay (HOSPITAL_COMMUNITY): Payer: 59 | Admitting: Anesthesiology

## 2019-07-29 ENCOUNTER — Encounter (HOSPITAL_COMMUNITY): Admission: EM | Disposition: A | Discharge status: Rehabilitation Facility | Payer: Self-pay | Source: Home / Self Care

## 2019-07-29 ENCOUNTER — Encounter (HOSPITAL_COMMUNITY): Payer: Self-pay | Admitting: Orthopedic Surgery

## 2019-07-29 DIAGNOSIS — S3590XA Unspecified injury of unspecified blood vessel at abdomen, lower back and pelvis level, initial encounter: Secondary | ICD-10-CM

## 2019-07-29 DIAGNOSIS — I10 Essential (primary) hypertension: Secondary | ICD-10-CM | POA: Insufficient documentation

## 2019-07-29 DIAGNOSIS — S7291XA Unspecified fracture of right femur, initial encounter for closed fracture: Secondary | ICD-10-CM

## 2019-07-29 DIAGNOSIS — A630 Anogenital (venereal) warts: Secondary | ICD-10-CM

## 2019-07-29 HISTORY — PX: LAPAROTOMY: SHX154

## 2019-07-29 HISTORY — PX: APPLICATION OF WOUND VAC: SHX5189

## 2019-07-29 LAB — POCT I-STAT 4, (NA,K, GLUC, HGB,HCT)
Glucose, Bld: 174 mg/dL — ABNORMAL HIGH (ref 70–99)
HCT: 32 % — ABNORMAL LOW (ref 39.0–52.0)
Hemoglobin: 10.9 g/dL — ABNORMAL LOW (ref 13.0–17.0)
Potassium: 3.9 mmol/L (ref 3.5–5.1)
Sodium: 140 mmol/L (ref 135–145)

## 2019-07-29 LAB — PREPARE FRESH FROZEN PLASMA: Unit division: 0

## 2019-07-29 LAB — COMPREHENSIVE METABOLIC PANEL
ALT: 50 U/L — ABNORMAL HIGH (ref 0–44)
AST: 66 U/L — ABNORMAL HIGH (ref 15–41)
Albumin: 2.5 g/dL — ABNORMAL LOW (ref 3.5–5.0)
Alkaline Phosphatase: 26 U/L — ABNORMAL LOW (ref 38–126)
Anion gap: 11 (ref 5–15)
BUN: 21 mg/dL — ABNORMAL HIGH (ref 6–20)
CO2: 19 mmol/L — ABNORMAL LOW (ref 22–32)
Calcium: 7.3 mg/dL — ABNORMAL LOW (ref 8.9–10.3)
Chloride: 111 mmol/L (ref 98–111)
Creatinine, Ser: 1.04 mg/dL (ref 0.61–1.24)
GFR calc Af Amer: >60 mL/min (ref >60)
GFR calc non Af Amer: >60 mL/min (ref >60)
Glucose, Bld: 219 mg/dL — ABNORMAL HIGH (ref 70–99)
Potassium: 3.7 mmol/L (ref 3.5–5.1)
Sodium: 141 mmol/L (ref 135–145)
Total Bilirubin: 0.5 mg/dL (ref 0.3–1.2)
Total Protein: 3.7 g/dL — ABNORMAL LOW (ref 6.5–8.1)

## 2019-07-29 LAB — POCT I-STAT 7, (LYTES, BLD GAS, ICA,H+H)
Acid-base deficit: 15 mmol/L — ABNORMAL HIGH (ref 0.0–2.0)
Acid-base deficit: 8 mmol/L — ABNORMAL HIGH (ref 0.0–2.0)
Bicarbonate: 16.3 mmol/L — ABNORMAL LOW (ref 20.0–28.0)
Bicarbonate: 19.8 mmol/L — ABNORMAL LOW (ref 20.0–28.0)
Calcium, Ion: 1.04 mmol/L — ABNORMAL LOW (ref 1.15–1.40)
Calcium, Ion: 1.03 mmol/L — ABNORMAL LOW (ref 1.15–1.40)
HCT: 36 % — ABNORMAL LOW (ref 39.0–52.0)
HCT: 35 % — ABNORMAL LOW (ref 39.0–52.0)
Hemoglobin: 12.2 g/dL — ABNORMAL LOW (ref 13.0–17.0)
Hemoglobin: 11.9 g/dL — ABNORMAL LOW (ref 13.0–17.0)
O2 Saturation: 95 %
O2 Saturation: 94 %
Patient temperature: 97.5
Patient temperature: 98.5
Potassium: 4.4 mmol/L (ref 3.5–5.1)
Potassium: 3.7 mmol/L (ref 3.5–5.1)
Sodium: 144 mmol/L (ref 135–145)
Sodium: 146 mmol/L — ABNORMAL HIGH (ref 135–145)
TCO2: 18 mmol/L — ABNORMAL LOW (ref 22–32)
TCO2: 21 mmol/L — ABNORMAL LOW (ref 22–32)
pCO2 arterial: 65.2 mmHg (ref 32.0–48.0)
pCO2 arterial: 51.1 mmHg — ABNORMAL HIGH (ref 32.0–48.0)
pH, Arterial: 7.003 — CL (ref 7.350–7.450)
pH, Arterial: 7.196 — CL (ref 7.350–7.450)
pO2, Arterial: 116 mmHg — ABNORMAL HIGH (ref 83.0–108.0)
pO2, Arterial: 88 mmHg (ref 83.0–108.0)

## 2019-07-29 LAB — POCT I-STAT, CHEM 8
BUN: 21 mg/dL — ABNORMAL HIGH (ref 6–20)
BUN: 24 mg/dL — ABNORMAL HIGH (ref 6–20)
Calcium, Ion: 1.19 mmol/L (ref 1.15–1.40)
Calcium, Ion: 1.19 mmol/L (ref 1.15–1.40)
Chloride: 104 mmol/L (ref 98–111)
Chloride: 104 mmol/L (ref 98–111)
Creatinine, Ser: 0.8 mg/dL (ref 0.61–1.24)
Creatinine, Ser: 0.8 mg/dL (ref 0.61–1.24)
Glucose, Bld: 148 mg/dL — ABNORMAL HIGH (ref 70–99)
Glucose, Bld: 150 mg/dL — ABNORMAL HIGH (ref 70–99)
HCT: 29 % — ABNORMAL LOW (ref 39.0–52.0)
HCT: 30 % — ABNORMAL LOW (ref 39.0–52.0)
Hemoglobin: 10.2 g/dL — ABNORMAL LOW (ref 13.0–17.0)
Hemoglobin: 9.9 g/dL — ABNORMAL LOW (ref 13.0–17.0)
Potassium: 3.9 mmol/L (ref 3.5–5.1)
Potassium: 4.5 mmol/L (ref 3.5–5.1)
Sodium: 141 mmol/L (ref 135–145)
Sodium: 142 mmol/L (ref 135–145)
TCO2: 21 mmol/L — ABNORMAL LOW (ref 22–32)
TCO2: 23 mmol/L (ref 22–32)

## 2019-07-29 LAB — SURGICAL PCR SCREEN
MRSA, PCR: NEGATIVE
Staphylococcus aureus: NEGATIVE

## 2019-07-29 LAB — BPAM FFP
Blood Product Expiration Date: 202010262359
Blood Product Expiration Date: 202010262359
ISSUE DATE / TIME: 202010212305
ISSUE DATE / TIME: 202010212305
Unit Type and Rh: 6200
Unit Type and Rh: 6200

## 2019-07-29 LAB — LACTIC ACID, PLASMA: Lactic Acid, Venous: >11 mmol/L (ref 0.5–1.9)

## 2019-07-29 LAB — TRIGLYCERIDES: Triglycerides: 85 mg/dL (ref <150)

## 2019-07-29 LAB — PROTIME-INR
INR: 1.5 — ABNORMAL HIGH (ref 0.8–1.2)
INR: 2.3 — ABNORMAL HIGH (ref 0.8–1.2)
INR: 2 — ABNORMAL HIGH (ref 0.8–1.2)
Prothrombin Time: 18.1 seconds — ABNORMAL HIGH (ref 11.4–15.2)
Prothrombin Time: 25 seconds — ABNORMAL HIGH (ref 11.4–15.2)
Prothrombin Time: 22.4 seconds — ABNORMAL HIGH (ref 11.4–15.2)

## 2019-07-29 LAB — PREPARE RBC (CROSSMATCH)

## 2019-07-29 LAB — HEMOGLOBIN AND HEMATOCRIT, BLOOD
HCT: 12.5 % — ABNORMAL LOW (ref 39.0–52.0)
HCT: 35.2 % — ABNORMAL LOW (ref 39.0–52.0)
Hemoglobin: 3.7 g/dL — CL (ref 13.0–17.0)
Hemoglobin: 12.4 g/dL — ABNORMAL LOW (ref 13.0–17.0)

## 2019-07-29 LAB — HIV ANTIBODY (ROUTINE TESTING W REFLEX): HIV Screen 4th Generation wRfx: NONREACTIVE

## 2019-07-29 LAB — MAGNESIUM: Magnesium: 1.6 mg/dL — ABNORMAL LOW (ref 1.7–2.4)

## 2019-07-29 SURGERY — LAPAROTOMY, EXPLORATORY
Anesthesia: General | Site: Abdomen

## 2019-07-29 MED ORDER — ALBUMIN HUMAN 5 % IV SOLN
25.0000 g | Freq: Once | INTRAVENOUS | Status: AC
  Administered 2019-07-29: 25 g via INTRAVENOUS
  Filled 2019-07-29: qty 500

## 2019-07-29 MED ORDER — DIPHENHYDRAMINE HCL 50 MG/ML IJ SOLN: 12.5000 mg | Freq: Four times a day (QID) | INTRAMUSCULAR | Status: DC | PRN

## 2019-07-29 MED ORDER — HYDROCORTISONE (PERIANAL) 2.5 % EX CREA: 1.0000 "application " | TOPICAL_CREAM | Freq: Four times a day (QID) | CUTANEOUS | Status: DC | PRN

## 2019-07-29 MED ORDER — SODIUM CHLORIDE 0.9% IV SOLUTION
Freq: Once | INTRAVENOUS | Status: AC
  Administered 2019-07-29: 18:00:00 via INTRAVENOUS

## 2019-07-29 MED ORDER — TRAMADOL HCL 50 MG PO TABS
50.0000 mg | ORAL_TABLET | Freq: Four times a day (QID) | ORAL | Status: DC
  Administered 2019-07-29 – 2019-07-30 (×4): 50 mg via ORAL
  Filled 2019-07-29 (×4): qty 1

## 2019-07-29 MED ORDER — NOREPINEPHRINE 4 MG/250ML-% IV SOLN
0.0000 ug/min | INTRAVENOUS | Status: DC
  Administered 2019-07-29: 2 ug/min via INTRAVENOUS
  Filled 2019-07-29: qty 250

## 2019-07-29 MED ORDER — SODIUM CHLORIDE 0.9 % IV SOLN
2.0000 g | Freq: Two times a day (BID) | INTRAVENOUS | Status: DC
  Filled 2019-07-29: qty 2

## 2019-07-29 MED ORDER — HYDROMORPHONE HCL 1 MG/ML IJ SOLN
0.5000 mg | INTRAMUSCULAR | Status: DC | PRN
  Administered 2019-07-29: 2 mg via INTRAVENOUS
  Filled 2019-07-29: qty 2

## 2019-07-29 MED ORDER — PANTOPRAZOLE SODIUM 40 MG PO TBEC: 40.0000 mg | DELAYED_RELEASE_TABLET | Freq: Every day | ORAL | Status: DC

## 2019-07-29 MED ORDER — SODIUM CHLORIDE 0.9% IV SOLUTION
Freq: Once | INTRAVENOUS | Status: AC
  Administered 2019-07-29: 15:00:00 via INTRAVENOUS

## 2019-07-29 MED ORDER — ALBUMIN HUMAN 5 % IV SOLN
25.0000 g | Freq: Once | INTRAVENOUS | Status: AC
  Administered 2019-07-29: 25 g via INTRAVENOUS

## 2019-07-29 MED ORDER — ACETAMINOPHEN 650 MG RE SUPP: 650.0000 mg | Freq: Four times a day (QID) | RECTAL | Status: DC | PRN

## 2019-07-29 MED ORDER — ACETAMINOPHEN 325 MG PO TABS: 325.0000 mg | ORAL_TABLET | Freq: Four times a day (QID) | ORAL | Status: DC | PRN

## 2019-07-29 MED ORDER — SODIUM CHLORIDE 0.9% IV SOLUTION
Freq: Once | INTRAVENOUS | Status: AC
  Administered 2019-07-29: 10:00:00 via INTRAVENOUS

## 2019-07-29 MED ORDER — METHOCARBAMOL 1000 MG/10ML IJ SOLN
500.0000 mg | Freq: Four times a day (QID) | INTRAVENOUS | Status: DC | PRN
  Filled 2019-07-29 (×2): qty 5

## 2019-07-29 MED ORDER — PANTOPRAZOLE SODIUM 40 MG IV SOLR
40.0000 mg | Freq: Every day | INTRAVENOUS | Status: DC
  Administered 2019-07-29 – 2019-08-09 (×11): 40 mg via INTRAVENOUS
  Filled 2019-07-29 (×11): qty 40

## 2019-07-29 MED ORDER — VASOPRESSIN 20 UNIT/ML IV SOLN
INTRAVENOUS | Status: AC
  Filled 2019-07-29: qty 1

## 2019-07-29 MED ORDER — FENTANYL CITRATE (PF) 100 MCG/2ML IJ SOLN
50.0000 ug | INTRAMUSCULAR | Status: DC | PRN
  Administered 2019-07-29 – 2019-07-30 (×3): 100 ug via INTRAVENOUS
  Filled 2019-07-29 (×3): qty 2

## 2019-07-29 MED ORDER — 0.9 % SODIUM CHLORIDE (POUR BTL) OPTIME
TOPICAL | Status: DC | PRN
  Administered 2019-07-29 (×5): 1000 mL

## 2019-07-29 MED ORDER — SODIUM CHLORIDE 0.9 % IV SOLN: 8.0000 mg | Freq: Four times a day (QID) | INTRAVENOUS | Status: DC | PRN

## 2019-07-29 MED ORDER — HYDROCORTISONE 1 % EX CREA: 1.0000 "application " | TOPICAL_CREAM | Freq: Three times a day (TID) | CUTANEOUS | Status: DC | PRN

## 2019-07-29 MED ORDER — ALBUMIN HUMAN 5 % IV SOLN
12.5000 g | Freq: Once | INTRAVENOUS | Status: AC
  Administered 2019-07-29: 12.5 g via INTRAVENOUS
  Filled 2019-07-29: qty 250

## 2019-07-29 MED ORDER — MAGIC MOUTHWASH
15.0000 mL | Freq: Four times a day (QID) | ORAL | Status: DC | PRN
  Administered 2019-08-05: 15 mL via ORAL
  Filled 2019-07-29 (×3): qty 15

## 2019-07-29 MED ORDER — PHENYLEPHRINE HCL-NACL 10-0.9 MG/250ML-% IV SOLN
INTRAVENOUS | Status: AC
  Filled 2019-07-29: qty 250

## 2019-07-29 MED ORDER — DOCUSATE SODIUM 100 MG PO CAPS: 100.0000 mg | ORAL_CAPSULE | Freq: Two times a day (BID) | ORAL | Status: DC

## 2019-07-29 MED ORDER — NOREPINEPHRINE 16 MG/250ML-% IV SOLN
0.0000 ug/min | INTRAVENOUS | Status: DC
  Filled 2019-07-29: qty 250

## 2019-07-29 MED ORDER — OXYCODONE HCL 5 MG PO TABS: 5.0000 mg | ORAL_TABLET | ORAL | Status: DC | PRN

## 2019-07-29 MED ORDER — SODIUM CHLORIDE 0.9 % IV SOLN: INTRAVENOUS | Status: DC

## 2019-07-29 MED ORDER — SODIUM CHLORIDE 0.9 % IV SOLN
10.0000 mL/h | Freq: Once | INTRAVENOUS | Status: AC
  Administered 2019-07-29: 10 mL/h via INTRAVENOUS

## 2019-07-29 MED ORDER — MIDAZOLAM HCL 2 MG/2ML IJ SOLN
2.0000 mg | Freq: Once | INTRAMUSCULAR | Status: AC
  Administered 2019-07-29: 2 mg via INTRAVENOUS

## 2019-07-29 MED ORDER — POTASSIUM CHLORIDE IN NACL 20-0.9 MEQ/L-% IV SOLN
INTRAVENOUS | Status: DC
  Administered 2019-07-29 – 2019-07-30 (×3): via INTRAVENOUS
  Filled 2019-07-29 (×3): qty 1000

## 2019-07-29 MED ORDER — CEFAZOLIN SODIUM-DEXTROSE 2-4 GM/100ML-% IV SOLN
2.0000 g | Freq: Four times a day (QID) | INTRAVENOUS | Status: AC
  Administered 2019-07-29 (×2): 2 g via INTRAVENOUS
  Filled 2019-07-29 (×2): qty 100

## 2019-07-29 MED ORDER — PHENOL 1.4 % MT LIQD: 1.0000 | OROMUCOSAL | Status: DC | PRN

## 2019-07-29 MED ORDER — PROCHLORPERAZINE EDISYLATE 10 MG/2ML IJ SOLN: 5.0000 mg | INTRAMUSCULAR | Status: DC | PRN

## 2019-07-29 MED ORDER — METOCLOPRAMIDE HCL 5 MG/ML IJ SOLN: 5.0000 mg | Freq: Three times a day (TID) | INTRAMUSCULAR | Status: DC | PRN

## 2019-07-29 MED ORDER — CHLORHEXIDINE GLUCONATE 0.12% ORAL RINSE (MEDLINE KIT)
15.0000 mL | Freq: Two times a day (BID) | OROMUCOSAL | Status: DC
  Administered 2019-07-29 – 2019-08-04 (×12): 15 mL via OROMUCOSAL

## 2019-07-29 MED ORDER — ALBUMIN HUMAN 5 % IV SOLN
INTRAVENOUS | Status: AC
  Filled 2019-07-29: qty 500

## 2019-07-29 MED ORDER — FENTANYL CITRATE (PF) 250 MCG/5ML IJ SOLN
INTRAMUSCULAR | Status: AC
  Filled 2019-07-29: qty 5

## 2019-07-29 MED ORDER — SODIUM CHLORIDE 0.9% IV SOLUTION
Freq: Once | INTRAVENOUS | Status: AC
  Administered 2019-07-29: 11:00:00 via INTRAVENOUS

## 2019-07-29 MED ORDER — PHENYLEPHRINE HCL-NACL 10-0.9 MG/250ML-% IV SOLN
0.0000 ug/min | INTRAVENOUS | Status: DC
  Administered 2019-07-29 (×2): 400 ug/min via INTRAVENOUS
  Administered 2019-07-29: 125 ug/min via INTRAVENOUS
  Administered 2019-07-29: 400 ug/min via INTRAVENOUS
  Administered 2019-07-29: 350 ug/min via INTRAVENOUS
  Administered 2019-07-29 (×3): 400 ug/min via INTRAVENOUS
  Administered 2019-07-29: 40 ug/min via INTRAVENOUS
  Administered 2019-07-29: 360 ug/min via INTRAVENOUS
  Administered 2019-07-29: 250 ug/min via INTRAVENOUS
  Filled 2019-07-29: qty 750
  Filled 2019-07-29: qty 250
  Filled 2019-07-29: qty 500
  Filled 2019-07-29 (×2): qty 750
  Filled 2019-07-29: qty 250

## 2019-07-29 MED ORDER — LACTATED RINGERS IV SOLN
INTRAVENOUS | Status: DC
  Administered 2019-07-29 (×2): via INTRAVENOUS

## 2019-07-29 MED ORDER — GUAIFENESIN-DM 100-10 MG/5ML PO SYRP: 10.0000 mL | ORAL_SOLUTION | ORAL | Status: DC | PRN

## 2019-07-29 MED ORDER — MENTHOL 3 MG MT LOZG: 1.0000 | LOZENGE | OROMUCOSAL | Status: DC | PRN

## 2019-07-29 MED ORDER — LACTATED RINGERS IV BOLUS: 1000.0000 mL | Freq: Three times a day (TID) | INTRAVENOUS | Status: DC | PRN

## 2019-07-29 MED ORDER — ROCURONIUM BROMIDE 10 MG/ML (PF) SYRINGE
PREFILLED_SYRINGE | INTRAVENOUS | Status: AC
  Filled 2019-07-29: qty 10

## 2019-07-29 MED ORDER — METHOCARBAMOL 1000 MG/10ML IJ SOLN: 1000.0000 mg | Freq: Four times a day (QID) | INTRAVENOUS | Status: DC | PRN

## 2019-07-29 MED ORDER — SODIUM CHLORIDE 0.9 % IV SOLN
INTRAVENOUS | Status: DC | PRN
  Administered 2019-07-29 (×2): via INTRAVENOUS

## 2019-07-29 MED ORDER — EPINEPHRINE 1 MG/10ML IJ SOSY
0.5000 mg | PREFILLED_SYRINGE | Freq: Once | INTRAMUSCULAR | Status: AC
  Administered 2019-07-29: 0.5 mg via INTRAVENOUS

## 2019-07-29 MED ORDER — MAGNESIUM SULFATE 2 GM/50ML IV SOLN
2.0000 g | Freq: Once | INTRAVENOUS | Status: AC
  Administered 2019-07-29: 15:00:00 2 g via INTRAVENOUS
  Filled 2019-07-29: qty 50

## 2019-07-29 MED ORDER — EPINEPHRINE 1 MG/10ML IJ SOSY
PREFILLED_SYRINGE | INTRAMUSCULAR | Status: AC
  Filled 2019-07-29: qty 10

## 2019-07-29 MED ORDER — HYDROMORPHONE HCL 1 MG/ML IJ SOLN: 0.5000 mg | INTRAMUSCULAR | Status: DC | PRN

## 2019-07-29 MED ORDER — ONDANSETRON HCL 4 MG/2ML IJ SOLN
4.0000 mg | Freq: Four times a day (QID) | INTRAMUSCULAR | Status: DC | PRN
  Administered 2019-07-31: 4 mg via INTRAVENOUS

## 2019-07-29 MED ORDER — SODIUM CHLORIDE 0.9 % IV BOLUS
1000.0000 mL | Freq: Once | INTRAVENOUS | Status: AC
  Administered 2019-07-29: 1000 mL via INTRAVENOUS

## 2019-07-29 MED ORDER — ALBUMIN HUMAN 5 % IV SOLN
INTRAVENOUS | Status: DC | PRN
  Administered 2019-07-29 (×3): via INTRAVENOUS

## 2019-07-29 MED ORDER — CHLORHEXIDINE GLUCONATE CLOTH 2 % EX PADS
6.0000 | MEDICATED_PAD | Freq: Every day | CUTANEOUS | Status: DC
  Administered 2019-07-29 – 2019-08-03 (×5): 6 via TOPICAL

## 2019-07-29 MED ORDER — CALCIUM CHLORIDE 10 % IV SOLN
INTRAVENOUS | Status: DC | PRN
  Administered 2019-07-29 (×2): 0.5 g via INTRAVENOUS

## 2019-07-29 MED ORDER — MIDAZOLAM HCL 2 MG/2ML IJ SOLN
INTRAMUSCULAR | Status: AC
  Filled 2019-07-29: qty 2

## 2019-07-29 MED ORDER — LIP MEDEX EX OINT
1.0000 "application " | TOPICAL_OINTMENT | Freq: Two times a day (BID) | CUTANEOUS | Status: DC
  Administered 2019-07-29 – 2019-09-01 (×68): 1 via TOPICAL
  Filled 2019-07-29 (×2): qty 7

## 2019-07-29 MED ORDER — ROCURONIUM BROMIDE 10 MG/ML (PF) SYRINGE
PREFILLED_SYRINGE | INTRAVENOUS | Status: DC | PRN
  Administered 2019-07-29: 50 mg via INTRAVENOUS
  Administered 2019-07-29: 60 mg via INTRAVENOUS

## 2019-07-29 MED ORDER — CALCIUM GLUCONATE-NACL 1-0.675 GM/50ML-% IV SOLN
1.0000 g | Freq: Once | INTRAVENOUS | Status: AC
  Administered 2019-07-29: 1000 mg via INTRAVENOUS
  Filled 2019-07-29: qty 50

## 2019-07-29 MED ORDER — METHOCARBAMOL 500 MG PO TABS: 500.0000 mg | ORAL_TABLET | Freq: Four times a day (QID) | ORAL | Status: DC | PRN

## 2019-07-29 MED ORDER — ORAL CARE MOUTH RINSE
15.0000 mL | OROMUCOSAL | Status: DC
  Administered 2019-07-29 – 2019-08-04 (×64): 15 mL via OROMUCOSAL

## 2019-07-29 MED ORDER — METOCLOPRAMIDE HCL 5 MG PO TABS
5.0000 mg | ORAL_TABLET | Freq: Three times a day (TID) | ORAL | Status: DC | PRN
  Filled 2019-07-29: qty 2

## 2019-07-29 MED ORDER — SODIUM BICARBONATE 8.4 % IV SOLN
100.0000 meq | Freq: Once | INTRAVENOUS | Status: AC
  Administered 2019-07-29: 100 meq via INTRAVENOUS
  Filled 2019-07-29: qty 50

## 2019-07-29 MED ORDER — SODIUM BICARBONATE 8.4 % IV SOLN
INTRAVENOUS | Status: DC | PRN
  Administered 2019-07-29 (×2): 50 meq via INTRAVENOUS

## 2019-07-29 MED ORDER — ONDANSETRON HCL 4 MG/2ML IJ SOLN: 4.0000 mg | Freq: Four times a day (QID) | INTRAMUSCULAR | Status: DC | PRN

## 2019-07-29 MED ORDER — BISACODYL 10 MG RE SUPP
10.0000 mg | Freq: Every day | RECTAL | Status: DC
  Administered 2019-07-30 – 2019-08-01 (×2): 10 mg via RECTAL
  Filled 2019-07-29 (×2): qty 1

## 2019-07-29 MED ORDER — SODIUM CHLORIDE 0.9% IV SOLUTION
Freq: Once | INTRAVENOUS | Status: AC
  Administered 2019-07-29: 06:00:00 via INTRAVENOUS

## 2019-07-29 MED ORDER — ENOXAPARIN SODIUM 30 MG/0.3ML ~~LOC~~ SOLN: 30.0000 mg | Freq: Two times a day (BID) | SUBCUTANEOUS | Status: DC

## 2019-07-29 MED ORDER — ONDANSETRON HCL 4 MG PO TABS: 4.0000 mg | ORAL_TABLET | Freq: Four times a day (QID) | ORAL | Status: DC | PRN

## 2019-07-29 MED ORDER — PROPOFOL 1000 MG/100ML IV EMUL
5.0000 ug/kg/min | INTRAVENOUS | Status: DC
  Administered 2019-07-29: 15 ug/kg/min via INTRAVENOUS
  Administered 2019-07-29: 25 ug/kg/min via INTRAVENOUS
  Administered 2019-07-29: 03:00:00 30 ug/kg/min via INTRAVENOUS
  Administered 2019-07-29 – 2019-07-30 (×2): 25 ug/kg/min via INTRAVENOUS
  Administered 2019-08-01: 15 ug/kg/min via INTRAVENOUS
  Administered 2019-08-01: 5 ug/kg/min via INTRAVENOUS
  Administered 2019-08-02: 15 ug/kg/min via INTRAVENOUS
  Administered 2019-08-02: 40 ug/kg/min via INTRAVENOUS
  Administered 2019-08-02: 15 ug/kg/min via INTRAVENOUS
  Administered 2019-08-03: 10 ug/kg/min via INTRAVENOUS
  Administered 2019-08-03: 30 ug/kg/min via INTRAVENOUS
  Administered 2019-08-03: 40 ug/kg/min via INTRAVENOUS
  Filled 2019-07-29 (×12): qty 100

## 2019-07-29 MED ORDER — SODIUM CHLORIDE 0.9 % IV SOLN
INTRAVENOUS | Status: DC | PRN
  Administered 2019-07-29: 13:00:00 via INTRAVENOUS

## 2019-07-29 MED ORDER — ALUM & MAG HYDROXIDE-SIMETH 200-200-20 MG/5ML PO SUSP: 30.0000 mL | Freq: Four times a day (QID) | ORAL | Status: DC | PRN

## 2019-07-29 SURGICAL SUPPLY — 47 items
APL PRP STRL LF DISP 70% ISPRP (MISCELLANEOUS)
BLADE CLIPPER SURG (BLADE) IMPLANT
CANISTER SUCT 3000ML PPV (MISCELLANEOUS) ×3 IMPLANT
CANISTER WOUNDNEG PRESSURE 500 (CANNISTER) ×3 IMPLANT
CHLORAPREP W/TINT 26 (MISCELLANEOUS) IMPLANT
COVER SURGICAL LIGHT HANDLE (MISCELLANEOUS) ×3 IMPLANT
COVER WAND RF STERILE (DRAPES) IMPLANT
DRAPE LAPAROSCOPIC ABDOMINAL (DRAPES) ×3 IMPLANT
DRAPE WARM FLUID 44X44 (DRAPES) ×3 IMPLANT
DRSG OPSITE POSTOP 4X10 (GAUZE/BANDAGES/DRESSINGS) IMPLANT
DRSG OPSITE POSTOP 4X8 (GAUZE/BANDAGES/DRESSINGS) IMPLANT
ELECT BLADE 6.5 EXT (BLADE) IMPLANT
ELECT CAUTERY BLADE 6.4 (BLADE) ×3 IMPLANT
ELECT REM PT RETURN 9FT ADLT (ELECTROSURGICAL) ×3
ELECTRODE REM PT RTRN 9FT ADLT (ELECTROSURGICAL) ×2 IMPLANT
GLOVE BIO SURGEON STRL SZ 6 (GLOVE) ×3 IMPLANT
GLOVE BIO SURGEON STRL SZ8 (GLOVE) ×3 IMPLANT
GLOVE BIOGEL PI IND STRL 8 (GLOVE) ×2 IMPLANT
GLOVE BIOGEL PI INDICATOR 8 (GLOVE) ×1
GLOVE INDICATOR 6.5 STRL GRN (GLOVE) ×3 IMPLANT
GOWN STRL REUS W/ TWL LRG LVL3 (GOWN DISPOSABLE) ×2 IMPLANT
GOWN STRL REUS W/ TWL XL LVL3 (GOWN DISPOSABLE) ×2 IMPLANT
GOWN STRL REUS W/TWL LRG LVL3 (GOWN DISPOSABLE) ×3
GOWN STRL REUS W/TWL XL LVL3 (GOWN DISPOSABLE) ×3
HANDLE SUCTION POOLE (INSTRUMENTS) ×2 IMPLANT
KIT BASIN OR (CUSTOM PROCEDURE TRAY) ×3 IMPLANT
KIT TURNOVER KIT B (KITS) ×3 IMPLANT
LIGASURE IMPACT 36 18CM CVD LR (INSTRUMENTS) ×3 IMPLANT
NS IRRIG 1000ML POUR BTL (IV SOLUTION) ×6 IMPLANT
PACK GENERAL/GYN (CUSTOM PROCEDURE TRAY) ×3 IMPLANT
PAD ARMBOARD 7.5X6 YLW CONV (MISCELLANEOUS) ×3 IMPLANT
PENCIL SMOKE EVACUATOR (MISCELLANEOUS) IMPLANT
RELOAD PROXIMATE 75MM BLUE (ENDOMECHANICALS) ×3 IMPLANT
SPECIMEN JAR LARGE (MISCELLANEOUS) IMPLANT
SPONGE ABD ABTHERA ADVANCE (MISCELLANEOUS) ×3 IMPLANT
SPONGE LAP 18X18 RF (DISPOSABLE) ×24 IMPLANT
STAPLER PROXIMATE 75MM BLUE (STAPLE) ×3 IMPLANT
STAPLER VISISTAT 35W (STAPLE) ×3 IMPLANT
SUCTION POOLE HANDLE (INSTRUMENTS) ×3
SUT PDS AB 1 TP1 96 (SUTURE) ×6 IMPLANT
SUT SILK 2 0 SH CR/8 (SUTURE) ×3 IMPLANT
SUT SILK 2 0 TIES 10X30 (SUTURE) ×3 IMPLANT
SUT SILK 3 0 SH CR/8 (SUTURE) ×3 IMPLANT
SUT SILK 3 0 TIES 10X30 (SUTURE) ×3 IMPLANT
TOWEL GREEN STERILE (TOWEL DISPOSABLE) ×3 IMPLANT
TRAY FOLEY MTR SLVR 16FR STAT (SET/KITS/TRAYS/PACK) IMPLANT
YANKAUER SUCT BULB TIP NO VENT (SUCTIONS) IMPLANT
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
## 2019-07-29 NOTE — Procedures (Signed)
Central Venous Catheter Insertion Procedure Note [name] [id number] 1960/11/19  Procedure: Insertion of Central Venous Catheter Indications: Drug and/or fluid administration  Procedure Details Consent: Risks of procedure as well as the alternatives and risks of each were explained to the (patient/caregiver).  Consent for procedure obtained. Time Out: Verified patient identification, verified procedure, site/side was marked, verified correct patient position, special equipment/implants available, medications/allergies/relevent history reviewed, required imaging and test results available.  Performed  Maximum sterile technique was used including antiseptics, cap, gloves, gown, hand hygiene, mask and sheet. Skin prep: Chlorhexidine; local anesthetic administered A antimicrobial bonded/coated triple lumen catheter was placed in the right subclavian vein using the Seldinger technique.  Evaluation Blood flow good Complications: No apparent complications Patient did tolerate procedure well. Chest X-ray ordered to verify placement.  CXR: pending.  [name] 07/29/2019, 9:01 AM
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
## 2019-07-29 NOTE — OR Nursing (Signed)
2 Lap sponges intentionally left in Abdominal cavity by Dr. Grandville Silos.

## 2019-07-29 NOTE — Progress Notes (Signed)
Patient's abdomen slightly more distended than earlier this am. Pupils are now sluggish and pt is less responsive. Maxed on two pressors and several units of blood products give. BP is still critically low with no change despite efforts. MD at bedside planning to take pt back to OR. Wife is at the bedside and consent is signed. Emotional support given. Duglas Heier, Rande Brunt, RN

## 2019-07-29 NOTE — Social Work (Signed)
CSW acknowledging consult for SNF placement. Will follow for therapy recommendations needed to best determine disposition/for insurance authorization.   Kaely Hollan, MSW, LCSWA [place] [facility] [phone]   
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
## 2019-07-29 NOTE — Anesthesia Preprocedure Evaluation (Signed)
Anesthesia Evaluation  Patient identified by MRN, date of birth, ID band Patient awake    Reviewed: Allergy & Precautions, NPO status , Patient's Chart, lab work & pertinent test results  Airway Mallampati: I  TM Distance: >3 FB Neck ROM: Full    Dental   Pulmonary    Pulmonary exam normal        Cardiovascular hypertension, Pt. on medications Normal cardiovascular exam     Neuro/Psych    GI/Hepatic   Endo/Other    Renal/GU      Musculoskeletal   Abdominal   Peds  Hematology   Anesthesia Other Findings   Reproductive/Obstetrics                             Anesthesia Physical  Anesthesia Plan  ASA: III and emergent  Anesthesia Plan: General   Post-op Pain Management:    Induction:   PONV Risk Score and Plan: 2 and Ondansetron, Treatment may vary due to age or medical condition and Midazolam  Airway Management Planned: Oral ETT  Additional Equipment:   Intra-op Plan:   Post-operative Plan: Post-operative intubation/ventilation  Informed Consent: I have reviewed the patients History and Physical, chart, labs and discussed the procedure including the risks, benefits and alternatives for the proposed anesthesia with the patient or authorized representative who has indicated his/her understanding and acceptance.       Plan Discussed with: CRNA, Surgeon and Anesthesiologist  Anesthesia Plan Comments:         Anesthesia Quick Evaluation

## 2019-07-29 NOTE — ED Notes (Signed)
Ortho attending at bedside

## 2019-07-29 NOTE — Transfer of Care (Signed)
Immediate Anesthesia Transfer of Care Note  Patient: [name]  Procedure(s) Performed: EXPLORATORY LAPAROTOMY, ileocecectomy (N/A Abdomen) Application Of Wound Vac (Abdomen)  Patient Location: NICU  Anesthesia Type:General  Level of Consciousness: sedated and Patient remains intubated per anesthesia plan  Airway & Oxygen Therapy: Patient remains intubated per anesthesia plan and Patient placed on Ventilator (see vital sign flow sheet for setting)  Post-op Assessment: Report given to RN and Post -op Vital signs reviewed and stable  Post vital signs: Reviewed and stable  Last Vitals:  Vitals Value Taken Time  BP    Temp    Pulse 127 07/29/19 1417  Resp 17 07/29/19 1417  SpO2 88 % 07/29/19 1417  Vitals shown include unvalidated device data.  Last Pain:  Vitals:   07/29/19 1217  TempSrc: Axillary  PainSc:          Complications: No apparent anesthesia complications

## 2019-07-29 NOTE — Progress Notes (Signed)
Patient ID: [name], male   DOB: 1961-01-19, 58 y.o.   MRN: [id number] H/H now. TF additional 1u PRBC. FFP in. I spoke with his wife at the bedside. Ezel works for [employer] and planned to retire next year.  Georganna Skeans, MD, MPH, FACS Please use [email or web address] to contact on call provider

## 2019-07-29 NOTE — Progress Notes (Signed)
CRITICAL VALUE ALERT  Critical Value:  Lactic Acid >11.0   Date & Time Notied:  07/29/19 0743  Provider Notified: Dr. Grandville Silos   Orders Received/Actions taken: New orders received

## 2019-07-29 NOTE — Progress Notes (Addendum)
  Subjective: [name] is a 58 y.o. male s/p right Hip IM nail.  They are POD1.  Patient is unable to provide history due to critical condition.      Objective: Vital signs in last 24 hours: Temp:  [97.5 F (36.4 C)-98.6 F (37 C)] 98.5 F (36.9 C) (10/22 1647) Pulse Rate:  [51-139] 120 (10/22 1647) Resp:  [6-34] 18 (10/22 1647) BP: (54-156)/(34-106) 140/58 (10/22 1647) SpO2:  [97 %-100 %] 98 % (10/22 1647) Arterial Line BP: (61-163)/(31-90) 122/61 (10/22 1647) FiO2 (%):  [40 %-100 %] 80 % (10/22 1529) Weight:  [81.6 kg-85 kg] 85 kg (10/22 0240)  Intake/Output from previous day: 10/21 0701 - 10/22 0700 In: 4866.8 [I.V.:3217; Blood:350.8; IV Piggyback:1299] Out: 1300 [Urine:900; Blood:400] Intake/Output this shift: Total I/O In: 13358.2 [I.V.:6234.5; Blood:5379; IV Piggyback:1744.7] Out: 2950 [Urine:450; Drains:500; Blood:2000]  Exam:  No gross blood or drainage overlying the dressing No pulse able to be palpated, ICU nurse has been able to palpate pulse throughout the day until patient became severely hypotensive.   Unable to examine sensory or motor function due to pt condition   Labs: Recent Labs    07/29/19 0018 07/29/19 0132 07/29/19 1200 07/29/19 1524 07/29/19 1530  HGB 10.2* 9.9* 3.7* 12.2* 13.1   Recent Labs    07/28/19 1930  07/29/19 1524 07/29/19 1530  WBC 7.2  --   --  9.0  RBC 4.27  --   --  4.26  HCT 41.3   < > 36.0* 40.0  PLT 244  --   --  45*   < > = values in this interval not displayed.   Recent Labs    07/28/19 1930  07/29/19 0132 07/29/19 0325 07/29/19 1524  NA 140   < > 141 141 144  K 3.9   < > 4.5 3.7 4.4  CL 110   < > 104 111  --   CO2 20*  --   --  19*  --   BUN 22*   < > 21* 21*  --   CREATININE 1.01   < > 0.80 1.04  --   GLUCOSE 120*   < > 148* 219*  --   CALCIUM 9.0  --   --  7.3*  --    < > = values in this interval not displayed.   Recent Labs    07/29/19 0325 07/29/19 1530  INR 1.5* 2.3*     Assessment/Plan: Pt is POD1 s/p right hip IM nail.    -Patient was being prepared for subsequent exploratory laparatomy following a positive FAST exam when examining patient earlier today.  Will continue to follow   -Touchdown weightbearing to RLE  -Pain management and DVT prophylaxis per trauma team  -Tentative plan for follow-up with Dr. Marlou Sa in clinic ~2 weeks postoperatively   Donella Stade 07/29/2019, 4:50 PM

## 2019-07-29 NOTE — Progress Notes (Signed)
Patient ID: [name], male   DOB: 08/23/1961, 58 y.o.   MRN: [id number] Remains hypotensive despite resuscitation. I did a quick bedside U/S and there is free fluid in the abdomen concerning for hemorrhage. Will take emergently for exploratory laparotomy. I discussed the procedure, risks, and benefits with his wife. She agrees. WIll order more blood and FFP.  Georganna Skeans, MD, MPH, FACS Please use [email or web address] to contact on call provider

## 2019-07-29 NOTE — Progress Notes (Signed)
Received  patient from OR placed on ventilator.  Patient is tolerating well at this time.

## 2019-07-29 NOTE — Procedures (Signed)
Arterial Catheter Insertion Procedure Note [name] [id number] August 23, 1961  Procedure: Insertion of Arterial Catheter  Indications: Blood pressure monitoring  Procedure Details Consent: Unable to obtain consent because of altered level of consciousness. Time Out: Verified patient identification, verified procedure, site/side was marked, verified correct patient position, special equipment/implants available, medications/allergies/relevent history reviewed, required imaging and test results available.  Performed  Maximum sterile technique was used including antiseptics, cap, gloves, gown, hand hygiene, mask and sheet. Skin prep: Chlorhexidine; local anesthetic administered 20 gauge catheter was inserted into right radial artery using the Seldinger technique. ULTRASOUND GUIDANCE USED: YES Evaluation Blood flow good; BP tracing good. Complications: No apparent complications.   Wyman Songster [facility] 07/29/2019

## 2019-07-29 NOTE — Transfer of Care (Signed)
Immediate Anesthesia Transfer of Care Note  Patient: [name] [facility]  Procedure(s) Performed: EXPLORATORY LAPAROTOMY WITH MESENTERIC REPAIR (N/A Abdomen) Small Bowel Resection extended illeosintectomy (N/A Abdomen) INTRAMEDULLARY (IM) NAIL FEMORAL (Right Leg Upper) Closed Reduction Wrist (Left Wrist) Irrigation And Debridement  Left Knee (Left Knee)  Patient Location: ICU  Anesthesia Type:General  Level of Consciousness: Patient remains intubated per anesthesia plan  Airway & Oxygen Therapy: Patient remains intubated per anesthesia plan and Patient placed on Ventilator (see vital sign flow sheet for setting)  Post-op Assessment: Report given to RN and Post -op Vital signs reviewed and stable  Post vital signs: Reviewed and stable  Last Vitals:  Vitals Value Taken Time  BP 121/74 07/29/19 0240  Temp 36.4 C 07/29/19 0240  Pulse 95 07/29/19 0242  Resp 16 07/29/19 0244  SpO2 100 % 07/29/19 0242  Vitals shown include unvalidated device data.  Last Pain:  Vitals:   07/29/19 0240  TempSrc: Oral  PainSc:          Complications: No apparent anesthesia complications

## 2019-07-29 NOTE — Anesthesia Procedure Notes (Signed)
Procedure Name: Intubation Date/Time: 07/29/2019 12:37 PM Performed by: Colin Benton, CRNA Pre-anesthesia Checklist: Patient identified, Emergency Drugs available, Suction available and Patient being monitored Patient Re-evaluated:Patient Re-evaluated prior to induction Oxygen Delivery Method: Circle system utilized Preoxygenation: Pre-oxygenation with 100% oxygen Induction Type: Inhalational induction with existing ETT Placement Confirmation: positive ETCO2 and breath sounds checked- equal and bilateral Dental Injury: Teeth and Oropharynx as per pre-operative assessment

## 2019-07-30 ENCOUNTER — Encounter (HOSPITAL_COMMUNITY): Admission: EM | Disposition: A | Discharge status: Rehabilitation Facility | Payer: Self-pay | Source: Home / Self Care

## 2019-07-30 ENCOUNTER — Encounter (HOSPITAL_COMMUNITY): Payer: Self-pay | Admitting: General Surgery

## 2019-07-30 ENCOUNTER — Inpatient Hospital Stay (HOSPITAL_COMMUNITY): Payer: 59

## 2019-07-30 LAB — PREPARE FRESH FROZEN PLASMA
Unit division: 0
Unit division: 0
Unit division: 0
Unit division: 0
Unit division: 0
Unit division: 0
Unit division: 0

## 2019-07-30 LAB — CBC
HCT: 40 % (ref 39.0–52.0)
HCT: 34 % — ABNORMAL LOW (ref 39.0–52.0)
Hemoglobin: 13.1 g/dL (ref 13.0–17.0)
Hemoglobin: 11.7 g/dL — ABNORMAL LOW (ref 13.0–17.0)
MCH: 30.8 pg (ref 26.0–34.0)
MCH: 30.5 pg (ref 26.0–34.0)
MCHC: 32.8 g/dL (ref 30.0–36.0)
MCHC: 34.4 g/dL (ref 30.0–36.0)
MCV: 93.9 fL (ref 80.0–100.0)
MCV: 88.8 fL (ref 80.0–100.0)
Platelets: 45 10*3/uL — ABNORMAL LOW (ref 150–400)
Platelets: 39 10*3/uL — ABNORMAL LOW (ref 150–400)
RBC: 4.26 MIL/uL (ref 4.22–5.81)
RBC: 3.83 MIL/uL — ABNORMAL LOW (ref 4.22–5.81)
RDW: 14.2 % (ref 11.5–15.5)
RDW: 15.1 % (ref 11.5–15.5)
WBC: 9 10*3/uL (ref 4.0–10.5)
WBC: 9.1 10*3/uL (ref 4.0–10.5)
nRBC: 0 % (ref 0.0–0.2)
nRBC: 0.2 % (ref 0.0–0.2)

## 2019-07-30 LAB — BASIC METABOLIC PANEL
Anion gap: 10 (ref 5–15)
BUN: 38 mg/dL — ABNORMAL HIGH (ref 6–20)
CO2: 19 mmol/L — ABNORMAL LOW (ref 22–32)
Calcium: 7.1 mg/dL — ABNORMAL LOW (ref 8.9–10.3)
Chloride: 115 mmol/L — ABNORMAL HIGH (ref 98–111)
Creatinine, Ser: 3.18 mg/dL — ABNORMAL HIGH (ref 0.61–1.24)
GFR calc Af Amer: 23 mL/min — ABNORMAL LOW (ref >60)
GFR calc non Af Amer: 20 mL/min — ABNORMAL LOW (ref >60)
Glucose, Bld: 116 mg/dL — ABNORMAL HIGH (ref 70–99)
Potassium: 4.4 mmol/L (ref 3.5–5.1)
Sodium: 144 mmol/L (ref 135–145)

## 2019-07-30 LAB — BPAM FFP
Blood Product Expiration Date: 202010242359
Blood Product Expiration Date: 202010242359
Blood Product Expiration Date: 202010242359
Blood Product Expiration Date: 202010242359
Blood Product Expiration Date: 202010262359
Blood Product Expiration Date: 202010262359
Blood Product Expiration Date: 202010262359
Blood Product Expiration Date: 202010262359
Blood Product Expiration Date: 202010262359
Blood Product Expiration Date: 202010262359
ISSUE DATE / TIME: 202010220938
ISSUE DATE / TIME: 202010221208
ISSUE DATE / TIME: 202010221246
ISSUE DATE / TIME: 202010221246
ISSUE DATE / TIME: 202010221249
ISSUE DATE / TIME: 202010221249
ISSUE DATE / TIME: 202010221322
ISSUE DATE / TIME: 202010221322
ISSUE DATE / TIME: 202010221732
ISSUE DATE / TIME: 202010221921
Unit Type and Rh: 6200
Unit Type and Rh: 6200
Unit Type and Rh: 6200
Unit Type and Rh: 6200
Unit Type and Rh: 6200
Unit Type and Rh: 6200
Unit Type and Rh: 600
Unit Type and Rh: 6200
Unit Type and Rh: 6200
Unit Type and Rh: 6200

## 2019-07-30 LAB — POCT I-STAT 7, (LYTES, BLD GAS, ICA,H+H)
Acid-base deficit: 4 mmol/L — ABNORMAL HIGH (ref 0.0–2.0)
Bicarbonate: 19.7 mmol/L — ABNORMAL LOW (ref 20.0–28.0)
Calcium, Ion: 0.97 mmol/L — ABNORMAL LOW (ref 1.15–1.40)
HCT: 31 % — ABNORMAL LOW (ref 39.0–52.0)
Hemoglobin: 10.5 g/dL — ABNORMAL LOW (ref 13.0–17.0)
O2 Saturation: 99 %
Patient temperature: 99.6
Potassium: 4.2 mmol/L (ref 3.5–5.1)
Sodium: 144 mmol/L (ref 135–145)
TCO2: 21 mmol/L — ABNORMAL LOW (ref 22–32)
pCO2 arterial: 30.9 mmHg — ABNORMAL LOW (ref 32.0–48.0)
pH, Arterial: 7.415 (ref 7.350–7.450)
pO2, Arterial: 158 mmHg — ABNORMAL HIGH (ref 83.0–108.0)

## 2019-07-30 LAB — BPAM PLATELET PHERESIS
Blood Product Expiration Date: 202010242359
ISSUE DATE / TIME: 202010221501
Unit Type and Rh: 6200

## 2019-07-30 LAB — PREPARE PLATELET PHERESIS: Unit division: 0

## 2019-07-30 LAB — MAGNESIUM: Magnesium: 2 mg/dL (ref 1.7–2.4)

## 2019-07-30 LAB — PROTIME-INR
INR: 1.7 — ABNORMAL HIGH (ref 0.8–1.2)
Prothrombin Time: 20.2 seconds — ABNORMAL HIGH (ref 11.4–15.2)

## 2019-07-30 LAB — PHOSPHORUS: Phosphorus: 3.5 mg/dL (ref 2.5–4.6)

## 2019-07-30 SURGERY — OPEN REDUCTION INTERNAL FIXATION (ORIF) CLAVICULAR FRACTURE
Anesthesia: General | Laterality: Left

## 2019-07-30 MED ORDER — LACTATED RINGERS IV SOLN
INTRAVENOUS | Status: DC
  Administered 2019-07-30 – 2019-07-31 (×3): via INTRAVENOUS

## 2019-07-30 MED ORDER — SODIUM CHLORIDE 0.9% IV SOLUTION
Freq: Once | INTRAVENOUS | Status: AC
  Administered 2019-07-30: 01:00:00 via INTRAVENOUS

## 2019-07-30 MED ORDER — CALCIUM GLUCONATE-NACL 2-0.675 GM/100ML-% IV SOLN
2.0000 g | Freq: Once | INTRAVENOUS | Status: AC
  Administered 2019-07-30: 2000 mg via INTRAVENOUS
  Filled 2019-07-30: qty 100

## 2019-07-30 MED ORDER — ACETAMINOPHEN 325 MG PO TABS: 650.0000 mg | ORAL_TABLET | Freq: Four times a day (QID) | ORAL | Status: DC

## 2019-07-30 MED ORDER — SODIUM CHLORIDE 0.9 % IV SOLN
INTRAVENOUS | Status: DC | PRN
  Administered 2019-07-30: 250 mL via INTRAVENOUS
  Administered 2019-07-31: 1000 mL via INTRAVENOUS
  Administered 2019-07-31: 08:00:00 via INTRAVENOUS
  Administered 2019-08-02 – 2019-08-09 (×3): 500 mL via INTRAVENOUS
  Administered 2019-08-22: 200 mL via INTRAVENOUS

## 2019-07-30 MED ORDER — FENTANYL 2500MCG IN NS 250ML (10MCG/ML) PREMIX INFUSION
50.0000 ug/h | INTRAVENOUS | Status: DC
  Administered 2019-07-30: 50 ug/h via INTRAVENOUS
  Administered 2019-07-31 – 2019-08-01 (×3): 100 ug/h via INTRAVENOUS
  Administered 2019-08-02 – 2019-08-03 (×2): 125 ug/h via INTRAVENOUS
  Administered 2019-08-03: 200 ug/h via INTRAVENOUS
  Administered 2019-08-04: 100 ug/h via INTRAVENOUS
  Filled 2019-07-30 (×9): qty 250

## 2019-07-30 MED ORDER — FENTANYL BOLUS VIA INFUSION
50.0000 ug | INTRAVENOUS | Status: DC | PRN
  Administered 2019-07-30 – 2019-08-03 (×5): 50 ug via INTRAVENOUS
  Filled 2019-07-30 (×4): qty 50

## 2019-07-30 MED ORDER — LACTATED RINGERS IV BOLUS
1000.0000 mL | Freq: Once | INTRAVENOUS | Status: AC
  Administered 2019-07-30: 1000 mL via INTRAVENOUS

## 2019-07-30 MED ORDER — METHOCARBAMOL 1000 MG/10ML IJ SOLN
500.0000 mg | Freq: Three times a day (TID) | INTRAVENOUS | Status: DC
  Administered 2019-07-30 – 2019-07-31 (×4): 500 mg via INTRAVENOUS
  Filled 2019-07-30 (×6): qty 5

## 2019-07-30 MED ORDER — MORPHINE SULFATE (PF) 4 MG/ML IV SOLN
4.0000 mg | INTRAVENOUS | Status: DC | PRN
  Administered 2019-08-02 – 2019-08-04 (×5): 4 mg via INTRAVENOUS
  Filled 2019-07-30 (×5): qty 1

## 2019-07-30 MED ORDER — OXYCODONE HCL 5 MG PO TABS: 5.0000 mg | ORAL_TABLET | ORAL | Status: DC | PRN

## 2019-07-30 MED ORDER — ACETAMINOPHEN 160 MG/5ML PO SOLN
650.0000 mg | Freq: Four times a day (QID) | ORAL | Status: DC
  Administered 2019-07-30 – 2019-07-31 (×6): 650 mg
  Filled 2019-07-30 (×6): qty 20.3

## 2019-07-30 MED ORDER — FUROSEMIDE 10 MG/ML IJ SOLN
40.0000 mg | Freq: Once | INTRAMUSCULAR | Status: AC
  Administered 2019-07-30: 40 mg via INTRAVENOUS
  Filled 2019-07-30: qty 4

## 2019-07-30 MED ORDER — FENTANYL CITRATE (PF) 100 MCG/2ML IJ SOLN: 50.0000 ug | Freq: Once | INTRAMUSCULAR | Status: DC
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
## 2019-07-30 NOTE — Progress Notes (Signed)
  Subjective: [name] is a 58 y.o. male s/p right Hip IM nail.  They are POD2.  Pt remains intubated and unable to contribute to history.  Wife at bedside.    Objective: Vital signs in last 24 hours: Temp:  [97.8 F (36.6 C)-100.6 F (38.1 C)] 100.5 F (38.1 C) (10/23 1100) Pulse Rate:  [109-129] 125 (10/23 1400) Resp:  [0-25] 20 (10/23 1400) BP: (116-152)/(47-85) 143/69 (10/23 1400) SpO2:  [96 %-100 %] 96 % (10/23 1400) Arterial Line BP: (100-154)/(45-75) 108/62 (10/23 1400) FiO2 (%):  [40 %-100 %] 40 % (10/23 1125)  Intake/Output from previous day: 10/22 0701 - 10/23 0700 In: 16715.9 [I.V.:7844.9; Blood:6889.8; IV Piggyback:1981.2] Out: 59 [Urine:765; Emesis/NG output:50; Drains:2500; Blood:2000] Intake/Output this shift: Total I/O In: 340.5 [I.V.:310.7; IV Piggyback:29.8] Out: 500 [Drains:500]  Exam:  No gross blood or drainage overlying the dressing 1+ DP pulse.  Foot warm to touch Unable to assess sensory or motor function due to patient condition Leg lengths are roughly equal   Labs: Recent Labs    07/29/19 1530 07/29/19 1702 07/29/19 2327 07/30/19 0330 07/30/19 0500  HGB 13.1 11.9* 12.4* 10.5* 11.7*   Recent Labs    07/29/19 1530  07/30/19 0330 07/30/19 0500  WBC 9.0  --   --  9.1  RBC 4.26  --   --  3.83*  HCT 40.0   < > 31.0* 34.0*  PLT 45*  --   --  39*   < > = values in this interval not displayed.   Recent Labs    07/29/19 0325  07/30/19 0330 07/30/19 0500  NA 141   < > 144 144  K 3.7   < > 4.2 4.4  CL 111  --   --  115*  CO2 19*  --   --  19*  BUN 21*  --   --  38*  CREATININE 1.04  --   --  3.18*  GLUCOSE 219*  --   --  116*  CALCIUM 7.3*  --   --  7.1*   < > = values in this interval not displayed.   Recent Labs    07/29/19 2327 07/30/19 0818  INR 2.0* 1.7*    Assessment/Plan: Pt is POD2 s/p right hip IM nail.   -Touchdown weight bearing to RLE  -Recommend PT evaluation upon extubation  -Tentative f/u with Dr.  Marlou Sa in clinic in 2 weeks  -Pain management and DVT prophylaxis per trauma team   Gerrianne Scale Meleana Commerford 07/30/2019, 2:57 PM

## 2019-07-30 NOTE — Anesthesia Postprocedure Evaluation (Signed)
Anesthesia Post Note  Patient: [name] [facility]  Procedure(s) Performed: EXPLORATORY LAPAROTOMY WITH MESENTERIC REPAIR (N/A Abdomen) Small Bowel Resection extended illeosintectomy (N/A Abdomen) INTRAMEDULLARY (IM) NAIL FEMORAL (Right Leg Upper) Closed Reduction Wrist (Left Wrist) Irrigation And Debridement  Left Knee (Left Knee)     Anesthesia Type: General    Last Vitals:  Vitals:   07/30/19 0715 07/30/19 0735  BP: (!) 141/64 (!) 149/68  Pulse: (!) 115 (!) 115  Resp: 20 (!) 21  Temp:    SpO2: 100% 100%    Last Pain:  Vitals:   07/30/19 0530  TempSrc: Axillary  PainSc:                  Anysha Frappier DAVID
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
## 2019-07-30 NOTE — Plan of Care (Signed)
  Problem: Pain Managment: Goal: General experience of comfort will improve Outcome: Progressing   

## 2019-07-30 NOTE — Anesthesia Postprocedure Evaluation (Signed)
Anesthesia Post Note  Patient: Fotios Witter [facility]  Procedure(s) Performed: EXPLORATORY LAPAROTOMY, ileocecectomy (N/A Abdomen) Application Of Wound Vac (Abdomen)     Patient location during evaluation: PACU Anesthesia Type: General Level of consciousness: awake and alert Pain management: pain level controlled Vital Signs Assessment: post-procedure vital signs reviewed and stable Respiratory status: spontaneous breathing, nonlabored ventilation, respiratory function stable and patient connected to nasal cannula oxygen Cardiovascular status: blood pressure returned to baseline and stable Postop Assessment: no apparent nausea or vomiting Anesthetic complications: no    Last Vitals:  Vitals:   07/30/19 0600 07/30/19 0615  BP: (!) 144/59 (!) 133/55  Pulse: (!) 124 (!) 117  Resp: 10 (!) 0  Temp:    SpO2: 99% 100%    Last Pain:  Vitals:   07/30/19 0530  TempSrc: Axillary  PainSc:                  Jaevon Paras

## 2019-07-31 ENCOUNTER — Inpatient Hospital Stay (HOSPITAL_COMMUNITY): Payer: 59

## 2019-07-31 ENCOUNTER — Inpatient Hospital Stay (HOSPITAL_COMMUNITY): Payer: 59 | Admitting: Anesthesiology

## 2019-07-31 ENCOUNTER — Encounter (HOSPITAL_COMMUNITY): Admission: EM | Disposition: A | Discharge status: Rehabilitation Facility | Payer: Self-pay | Source: Home / Self Care

## 2019-07-31 ENCOUNTER — Encounter (HOSPITAL_COMMUNITY): Payer: Self-pay | Admitting: Nephrology

## 2019-07-31 HISTORY — PX: LAPAROTOMY: SHX154

## 2019-07-31 HISTORY — PX: APPLICATION OF WOUND VAC: SHX5189

## 2019-07-31 LAB — POCT I-STAT 7, (LYTES, BLD GAS, ICA,H+H)
Acid-base deficit: 22 mmol/L — ABNORMAL HIGH (ref 0.0–2.0)
Acid-base deficit: 17 mmol/L — ABNORMAL HIGH (ref 0.0–2.0)
Acid-base deficit: 8 mmol/L — ABNORMAL HIGH (ref 0.0–2.0)
Acid-base deficit: 8 mmol/L — ABNORMAL HIGH (ref 0.0–2.0)
Bicarbonate: 9 mmol/L — ABNORMAL LOW (ref 20.0–28.0)
Bicarbonate: 12.7 mmol/L — ABNORMAL LOW (ref 20.0–28.0)
Bicarbonate: 21 mmol/L (ref 20.0–28.0)
Bicarbonate: 19.1 mmol/L — ABNORMAL LOW (ref 20.0–28.0)
Calcium, Ion: 0.75 mmol/L — CL (ref 1.15–1.40)
Calcium, Ion: 0.8 mmol/L — CL (ref 1.15–1.40)
Calcium, Ion: 0.99 mmol/L — ABNORMAL LOW (ref 1.15–1.40)
Calcium, Ion: 0.96 mmol/L — ABNORMAL LOW (ref 1.15–1.40)
HCT: 16 % — ABNORMAL LOW (ref 39.0–52.0)
HCT: 26 % — ABNORMAL LOW (ref 39.0–52.0)
HCT: 30 % — ABNORMAL LOW (ref 39.0–52.0)
HCT: 30 % — ABNORMAL LOW (ref 39.0–52.0)
Hemoglobin: 5.4 g/dL — CL (ref 13.0–17.0)
Hemoglobin: 8.8 g/dL — ABNORMAL LOW (ref 13.0–17.0)
Hemoglobin: 10.2 g/dL — ABNORMAL LOW (ref 13.0–17.0)
Hemoglobin: 10.2 g/dL — ABNORMAL LOW (ref 13.0–17.0)
O2 Saturation: 100 %
O2 Saturation: 100 %
O2 Saturation: 98 %
O2 Saturation: 99 %
Patient temperature: 36.5
Patient temperature: 37
Potassium: 4.9 mmol/L (ref 3.5–5.1)
Potassium: 5.1 mmol/L (ref 3.5–5.1)
Potassium: 5.6 mmol/L — ABNORMAL HIGH (ref 3.5–5.1)
Potassium: 5.8 mmol/L — ABNORMAL HIGH (ref 3.5–5.1)
Sodium: 142 mmol/L (ref 135–145)
Sodium: 144 mmol/L (ref 135–145)
Sodium: 141 mmol/L (ref 135–145)
Sodium: 141 mmol/L (ref 135–145)
TCO2: 11 mmol/L — ABNORMAL LOW (ref 22–32)
TCO2: 14 mmol/L — ABNORMAL LOW (ref 22–32)
TCO2: 23 mmol/L (ref 22–32)
TCO2: 21 mmol/L — ABNORMAL LOW (ref 22–32)
pCO2 arterial: 49.8 mmHg — ABNORMAL HIGH (ref 32.0–48.0)
pCO2 arterial: 45.2 mmHg (ref 32.0–48.0)
pCO2 arterial: 60.1 mmHg — ABNORMAL HIGH (ref 32.0–48.0)
pCO2 arterial: 47.4 mmHg (ref 32.0–48.0)
pH, Arterial: 6.867 — CL (ref 7.350–7.450)
pH, Arterial: 7.057 — CL (ref 7.350–7.450)
pH, Arterial: 7.15 — CL (ref 7.350–7.450)
pH, Arterial: 7.214 — ABNORMAL LOW (ref 7.350–7.450)
pO2, Arterial: 450 mmHg — ABNORMAL HIGH (ref 83.0–108.0)
pO2, Arterial: 402 mmHg — ABNORMAL HIGH (ref 83.0–108.0)
pO2, Arterial: 128 mmHg — ABNORMAL HIGH (ref 83.0–108.0)
pO2, Arterial: 152 mmHg — ABNORMAL HIGH (ref 83.0–108.0)

## 2019-07-31 LAB — RENAL FUNCTION PANEL
Albumin: 2.4 g/dL — ABNORMAL LOW (ref 3.5–5.0)
Anion gap: 14 (ref 5–15)
BUN: 75 mg/dL — ABNORMAL HIGH (ref 6–20)
CO2: 18 mmol/L — ABNORMAL LOW (ref 22–32)
Calcium: 7 mg/dL — ABNORMAL LOW (ref 8.9–10.3)
Chloride: 111 mmol/L (ref 98–111)
Creatinine, Ser: 6.34 mg/dL — ABNORMAL HIGH (ref 0.61–1.24)
GFR calc Af Amer: 10 mL/min — ABNORMAL LOW (ref >60)
GFR calc non Af Amer: 9 mL/min — ABNORMAL LOW (ref >60)
Glucose, Bld: 115 mg/dL — ABNORMAL HIGH (ref 70–99)
Phosphorus: 7.1 mg/dL — ABNORMAL HIGH (ref 2.5–4.6)
Potassium: 6 mmol/L — ABNORMAL HIGH (ref 3.5–5.1)
Sodium: 143 mmol/L (ref 135–145)

## 2019-07-31 LAB — BPAM FFP
Blood Product Expiration Date: 202010272359
Blood Product Expiration Date: 202010272359
Blood Product Expiration Date: 202010282359
ISSUE DATE / TIME: 202010230102
ISSUE DATE / TIME: 202010230321
ISSUE DATE / TIME: 202010240723
Unit Type and Rh: 6200
Unit Type and Rh: 8400
Unit Type and Rh: 8400

## 2019-07-31 LAB — BASIC METABOLIC PANEL
Anion gap: 14 (ref 5–15)
BUN: 72 mg/dL — ABNORMAL HIGH (ref 6–20)
CO2: 18 mmol/L — ABNORMAL LOW (ref 22–32)
Calcium: 6.8 mg/dL — ABNORMAL LOW (ref 8.9–10.3)
Chloride: 111 mmol/L (ref 98–111)
Creatinine, Ser: 5.99 mg/dL — ABNORMAL HIGH (ref 0.61–1.24)
GFR calc Af Amer: 11 mL/min — ABNORMAL LOW (ref >60)
GFR calc non Af Amer: 9 mL/min — ABNORMAL LOW (ref >60)
Glucose, Bld: 109 mg/dL — ABNORMAL HIGH (ref 70–99)
Potassium: 6 mmol/L — ABNORMAL HIGH (ref 3.5–5.1)
Sodium: 143 mmol/L (ref 135–145)

## 2019-07-31 LAB — CBC
HCT: 34.6 % — ABNORMAL LOW (ref 39.0–52.0)
HCT: 32.4 % — ABNORMAL LOW (ref 39.0–52.0)
Hemoglobin: 11.4 g/dL — ABNORMAL LOW (ref 13.0–17.0)
Hemoglobin: 10.8 g/dL — ABNORMAL LOW (ref 13.0–17.0)
MCH: 30.3 pg (ref 26.0–34.0)
MCH: 31.2 pg (ref 26.0–34.0)
MCHC: 32.9 g/dL (ref 30.0–36.0)
MCHC: 33.3 g/dL (ref 30.0–36.0)
MCV: 92 fL (ref 80.0–100.0)
MCV: 93.6 fL (ref 80.0–100.0)
Platelets: 59 10*3/uL — ABNORMAL LOW (ref 150–400)
Platelets: 85 10*3/uL — ABNORMAL LOW (ref 150–400)
RBC: 3.76 MIL/uL — ABNORMAL LOW (ref 4.22–5.81)
RBC: 3.46 MIL/uL — ABNORMAL LOW (ref 4.22–5.81)
RDW: 15.9 % — ABNORMAL HIGH (ref 11.5–15.5)
RDW: 16 % — ABNORMAL HIGH (ref 11.5–15.5)
WBC: 12.2 10*3/uL — ABNORMAL HIGH (ref 4.0–10.5)
WBC: 11.8 10*3/uL — ABNORMAL HIGH (ref 4.0–10.5)
nRBC: 0.2 % (ref 0.0–0.2)
nRBC: 0.3 % — ABNORMAL HIGH (ref 0.0–0.2)

## 2019-07-31 LAB — PREPARE FRESH FROZEN PLASMA
Unit division: 0
Unit division: 0
Unit division: 0

## 2019-07-31 LAB — HEPATIC FUNCTION PANEL
ALT: 1315 U/L — ABNORMAL HIGH (ref 0–44)
AST: 2265 U/L — ABNORMAL HIGH (ref 15–41)
Albumin: 2.5 g/dL — ABNORMAL LOW (ref 3.5–5.0)
Alkaline Phosphatase: 45 U/L (ref 38–126)
Bilirubin, Direct: 0.8 mg/dL — ABNORMAL HIGH (ref 0.0–0.2)
Indirect Bilirubin: 1 mg/dL — ABNORMAL HIGH (ref 0.3–0.9)
Total Bilirubin: 1.8 mg/dL — ABNORMAL HIGH (ref 0.3–1.2)
Total Protein: 4.3 g/dL — ABNORMAL LOW (ref 6.5–8.1)

## 2019-07-31 LAB — PROTIME-INR
INR: 2 — ABNORMAL HIGH (ref 0.8–1.2)
INR: 2 — ABNORMAL HIGH (ref 0.8–1.2)
Prothrombin Time: 22.6 seconds — ABNORMAL HIGH (ref 11.4–15.2)
Prothrombin Time: 21.9 seconds — ABNORMAL HIGH (ref 11.4–15.2)

## 2019-07-31 LAB — TYPE AND SCREEN
ABO/RH(D): A POS
Antibody Screen: NEGATIVE

## 2019-07-31 LAB — MAGNESIUM
Magnesium: 1.9 mg/dL (ref 1.7–2.4)
Magnesium: 2 mg/dL (ref 1.7–2.4)

## 2019-07-31 LAB — PHOSPHORUS
Phosphorus: 5.9 mg/dL — ABNORMAL HIGH (ref 2.5–4.6)
Phosphorus: 7.9 mg/dL — ABNORMAL HIGH (ref 2.5–4.6)

## 2019-07-31 LAB — CALCIUM, IONIZED: Calcium, Ionized, Serum: 4 mg/dL — ABNORMAL LOW (ref 4.5–5.6)

## 2019-07-31 SURGERY — LAPAROTOMY, EXPLORATORY
Anesthesia: General | Site: Abdomen

## 2019-07-31 MED ORDER — ROCURONIUM BROMIDE 10 MG/ML (PF) SYRINGE
PREFILLED_SYRINGE | INTRAVENOUS | Status: AC
  Filled 2019-07-31: qty 10

## 2019-07-31 MED ORDER — PRISMASOL BGK 0/2.5 32-2.5 MEQ/L IV SOLN
INTRAVENOUS | Status: DC
  Administered 2019-07-31 – 2019-08-01 (×2): via INTRAVENOUS_CENTRAL
  Filled 2019-07-31 (×3): qty 5000

## 2019-07-31 MED ORDER — ROCURONIUM BROMIDE 100 MG/10ML IV SOLN
INTRAVENOUS | Status: DC | PRN
  Administered 2019-07-31: 20 mg via INTRAVENOUS
  Administered 2019-07-31: 50 mg via INTRAVENOUS
  Administered 2019-07-31: 30 mg via INTRAVENOUS

## 2019-07-31 MED ORDER — DEXAMETHASONE SODIUM PHOSPHATE 4 MG/ML IJ SOLN
INTRAMUSCULAR | Status: DC | PRN
  Administered 2019-07-31: 4 mg via INTRAVENOUS

## 2019-07-31 MED ORDER — FENTANYL CITRATE (PF) 250 MCG/5ML IJ SOLN
INTRAMUSCULAR | Status: AC
  Filled 2019-07-31: qty 5

## 2019-07-31 MED ORDER — DEXAMETHASONE SODIUM PHOSPHATE 10 MG/ML IJ SOLN
INTRAMUSCULAR | Status: AC
  Filled 2019-07-31: qty 1

## 2019-07-31 MED ORDER — SODIUM CHLORIDE 0.9 % FOR CRRT
INTRAVENOUS_CENTRAL | Status: DC | PRN
  Filled 2019-07-31: qty 1000

## 2019-07-31 MED ORDER — FUROSEMIDE 10 MG/ML IJ SOLN
80.0000 mg | Freq: Once | INTRAMUSCULAR | Status: AC
  Administered 2019-07-31: 12:00:00 80 mg via INTRAVENOUS
  Filled 2019-07-31: qty 8

## 2019-07-31 MED ORDER — MIDAZOLAM HCL 2 MG/2ML IJ SOLN
INTRAMUSCULAR | Status: AC
  Filled 2019-07-31: qty 2

## 2019-07-31 MED ORDER — CALCIUM GLUCONATE-NACL 1-0.675 GM/50ML-% IV SOLN
1.0000 g | Freq: Once | INTRAVENOUS | Status: AC
  Administered 2019-07-31: 1000 mg via INTRAVENOUS
  Filled 2019-07-31: qty 50

## 2019-07-31 MED ORDER — 0.9 % SODIUM CHLORIDE (POUR BTL) OPTIME
TOPICAL | Status: DC | PRN
  Administered 2019-07-31: 1000 mL
  Administered 2019-07-31: 2000 mL
  Administered 2019-07-31 (×2): 1000 mL

## 2019-07-31 MED ORDER — SODIUM CHLORIDE 0.9 % IV SOLN
INTRAVENOUS | Status: DC | PRN
  Administered 2019-07-31: 25 ug/min via INTRAVENOUS

## 2019-07-31 MED ORDER — PRISMASOL BGK 0/2.5 32-2.5 MEQ/L IV SOLN
INTRAVENOUS | Status: DC
  Administered 2019-07-31 – 2019-08-02 (×19): via INTRAVENOUS_CENTRAL
  Filled 2019-07-31 (×26): qty 5000

## 2019-07-31 MED ORDER — PHENYLEPHRINE 40 MCG/ML (10ML) SYRINGE FOR IV PUSH (FOR BLOOD PRESSURE SUPPORT)
PREFILLED_SYRINGE | INTRAVENOUS | Status: AC
  Filled 2019-07-31: qty 10

## 2019-07-31 MED ORDER — ALBUMIN HUMAN 5 % IV SOLN
INTRAVENOUS | Status: DC | PRN
  Administered 2019-07-31 (×2): via INTRAVENOUS

## 2019-07-31 MED ORDER — EPHEDRINE 5 MG/ML INJ
INTRAVENOUS | Status: AC
  Filled 2019-07-31: qty 10

## 2019-07-31 MED ORDER — SUCCINYLCHOLINE CHLORIDE 200 MG/10ML IV SOSY
PREFILLED_SYRINGE | INTRAVENOUS | Status: AC
  Filled 2019-07-31: qty 10

## 2019-07-31 MED ORDER — MIDAZOLAM HCL 5 MG/5ML IJ SOLN
INTRAMUSCULAR | Status: DC | PRN
  Administered 2019-07-31 (×2): 1 mg via INTRAVENOUS

## 2019-07-31 MED ORDER — PRISMASOL BGK 0/2.5 32-2.5 MEQ/L IV SOLN
INTRAVENOUS | Status: DC
  Administered 2019-07-31 – 2019-08-01 (×2): via INTRAVENOUS_CENTRAL
  Filled 2019-07-31 (×3): qty 5000

## 2019-07-31 MED ORDER — PHENYLEPHRINE 40 MCG/ML (10ML) SYRINGE FOR IV PUSH (FOR BLOOD PRESSURE SUPPORT)
PREFILLED_SYRINGE | INTRAVENOUS | Status: DC | PRN
  Administered 2019-07-31: 40 ug via INTRAVENOUS
  Administered 2019-07-31: 80 ug via INTRAVENOUS
  Administered 2019-07-31 (×3): 40 ug via INTRAVENOUS
  Administered 2019-07-31: 80 ug via INTRAVENOUS

## 2019-07-31 MED ORDER — LIDOCAINE 2% (20 MG/ML) 5 ML SYRINGE
INTRAMUSCULAR | Status: AC
  Filled 2019-07-31: qty 5

## 2019-07-31 MED ORDER — CIPROFLOXACIN IN D5W 400 MG/200ML IV SOLN
INTRAVENOUS | Status: DC | PRN
  Administered 2019-07-31: 400 mg via INTRAVENOUS

## 2019-07-31 MED ORDER — EPHEDRINE SULFATE-NACL 50-0.9 MG/10ML-% IV SOSY
PREFILLED_SYRINGE | INTRAVENOUS | Status: DC | PRN
  Administered 2019-07-31: 5 mg via INTRAVENOUS

## 2019-07-31 MED ORDER — HEPARIN SODIUM (PORCINE) 1000 UNIT/ML IJ SOLN
3000.0000 [IU] | Freq: Once | INTRAMUSCULAR | Status: DC
  Filled 2019-07-31: qty 3

## 2019-07-31 MED ORDER — METRONIDAZOLE IN NACL 5-0.79 MG/ML-% IV SOLN
INTRAVENOUS | Status: AC
  Filled 2019-07-31: qty 100

## 2019-07-31 MED ORDER — HEPARIN SODIUM (PORCINE) 1000 UNIT/ML DIALYSIS
1000.0000 [IU] | INTRAMUSCULAR | Status: DC | PRN
  Administered 2019-08-04 – 2019-08-06 (×2): 2800 [IU] via INTRAVENOUS_CENTRAL
  Administered 2019-08-09: 1400 [IU] via INTRAVENOUS_CENTRAL
  Administered 2019-08-09 – 2019-08-10 (×2): 2800 [IU] via INTRAVENOUS_CENTRAL
  Filled 2019-07-31 (×4): qty 6
  Filled 2019-07-31: qty 4
  Filled 2019-07-31 (×3): qty 6

## 2019-07-31 MED ORDER — FENTANYL CITRATE (PF) 100 MCG/2ML IJ SOLN
INTRAMUSCULAR | Status: DC | PRN
  Administered 2019-07-31 (×5): 50 ug via INTRAVENOUS

## 2019-07-31 MED ORDER — CIPROFLOXACIN IN D5W 400 MG/200ML IV SOLN
INTRAVENOUS | Status: AC
  Filled 2019-07-31: qty 200

## 2019-07-31 MED ORDER — METRONIDAZOLE IN NACL 5-0.79 MG/ML-% IV SOLN
INTRAVENOUS | Status: DC | PRN
  Administered 2019-07-31: 500 mg via INTRAVENOUS

## 2019-07-31 MED ORDER — METHOCARBAMOL 500 MG PO TABS
500.0000 mg | ORAL_TABLET | Freq: Three times a day (TID) | ORAL | Status: DC
  Administered 2019-08-01 – 2019-08-02 (×3): 500 mg
  Filled 2019-07-31 (×3): qty 1

## 2019-07-31 MED ORDER — PROPOFOL 10 MG/ML IV BOLUS
INTRAVENOUS | Status: AC
  Filled 2019-07-31: qty 20

## 2019-07-31 MED ORDER — "THROMBI-PAD 3""X3"" EX PADS"
1.0000 | MEDICATED_PAD | Freq: Once | CUTANEOUS | Status: AC
  Administered 2019-07-31: 1 via TOPICAL
  Filled 2019-07-31: qty 1

## 2019-07-31 MED ORDER — ONDANSETRON HCL 4 MG/2ML IJ SOLN
INTRAMUSCULAR | Status: AC
  Filled 2019-07-31: qty 2

## 2019-07-31 SURGICAL SUPPLY — 43 items
APL SKNCLS STERI-STRIP NONHPOA (GAUZE/BANDAGES/DRESSINGS) ×4
BENZOIN TINCTURE PRP APPL 2/3 (GAUZE/BANDAGES/DRESSINGS) ×6 IMPLANT
CANISTER WOUND CARE 500ML ATS (WOUND CARE) ×3 IMPLANT
COVER SURGICAL LIGHT HANDLE (MISCELLANEOUS) ×3 IMPLANT
DRAPE LAPAROSCOPIC ABDOMINAL (DRAPES) ×3 IMPLANT
DRAPE WARM FLUID 44X44 (DRAPES) ×3 IMPLANT
DRSG VAC ATS MED SENSATRAC (GAUZE/BANDAGES/DRESSINGS) ×3 IMPLANT
ELECT BLADE 6.5 EXT (BLADE) ×6 IMPLANT
ELECT CAUTERY BLADE 6.4 (BLADE) ×3 IMPLANT
ELECT REM PT RETURN 9FT ADLT (ELECTROSURGICAL) ×3
ELECTRODE REM PT RTRN 9FT ADLT (ELECTROSURGICAL) ×2 IMPLANT
GLOVE BIO SURGEON STRL SZ 6.5 (GLOVE) ×3 IMPLANT
GLOVE BIO SURGEON STRL SZ7.5 (GLOVE) ×3 IMPLANT
GLOVE BIOGEL PI IND STRL 6 (GLOVE) ×2 IMPLANT
GLOVE BIOGEL PI IND STRL 8 (GLOVE) ×2 IMPLANT
GLOVE BIOGEL PI INDICATOR 6 (GLOVE) ×1
GLOVE BIOGEL PI INDICATOR 8 (GLOVE) ×1
GOWN STRL REUS W/ TWL LRG LVL3 (GOWN DISPOSABLE) ×2 IMPLANT
GOWN STRL REUS W/ TWL XL LVL3 (GOWN DISPOSABLE) ×4 IMPLANT
GOWN STRL REUS W/TWL LRG LVL3 (GOWN DISPOSABLE) ×3
GOWN STRL REUS W/TWL XL LVL3 (GOWN DISPOSABLE) ×6
HANDLE SUCTION POOLE (INSTRUMENTS) ×2 IMPLANT
KIT BASIN OR (CUSTOM PROCEDURE TRAY) ×3 IMPLANT
KIT OSTOMY DRAINABLE 2.75 STR (WOUND CARE) ×3 IMPLANT
KIT TURNOVER KIT B (KITS) ×3 IMPLANT
LIGASURE IMPACT 36 18CM CVD LR (INSTRUMENTS) ×3 IMPLANT
NS IRRIG 1000ML POUR BTL (IV SOLUTION) ×6 IMPLANT
PACK GENERAL/GYN (CUSTOM PROCEDURE TRAY) ×3 IMPLANT
PAD ARMBOARD 7.5X6 YLW CONV (MISCELLANEOUS) ×6 IMPLANT
PENCIL SMOKE EVACUATOR (MISCELLANEOUS) ×3 IMPLANT
SPECIMEN JAR LARGE (MISCELLANEOUS) IMPLANT
SPONGE LAP 18X18 RF (DISPOSABLE) ×3 IMPLANT
STAPLER CUT CVD 40MM GREEN (STAPLE) ×3 IMPLANT
STAPLER VISISTAT 35W (STAPLE) ×3 IMPLANT
SUCTION POOLE HANDLE (INSTRUMENTS) ×3
SUT PDS AB 1 TP1 96 (SUTURE) ×6 IMPLANT
SUT PROLENE 2 0 SH DA (SUTURE) ×3 IMPLANT
SUT SILK 2 0 SH CR/8 (SUTURE) ×3 IMPLANT
SUT SILK 2 0 TIES 10X30 (SUTURE) ×3 IMPLANT
SUT SILK 3 0 SH CR/8 (SUTURE) ×3 IMPLANT
SUT SILK 3 0 TIES 10X30 (SUTURE) ×3 IMPLANT
SUT VIC AB 3-0 SH 18 (SUTURE) ×6 IMPLANT
TOWEL GREEN STERILE (TOWEL DISPOSABLE) ×3 IMPLANT

## 2019-07-31 NOTE — Procedures (Signed)
   Procedure Note  Date: 07/31/2019  Procedure: central venous catheter placement--right, internal jugular vein, with ultrasound guidance  Pre-op diagnosis: AKI with hyperkalemia Post-op diagnosis: same  Surgeon: Jesusita Oka, MD  Anesthesia: local  EBL: <5cc Drains/Implants: 20 cm, triple lumen central venous catheter (vascath)  Description of procedure: Time-out was performed verifying correct patient, procedure, site, laterality, and signature of informed consent. The right neck was prepped and draped in the usual sterile fashion. The internal jugular vein was localized with ultrasound guidance. Five ccs of local anesthetic was infiltrated at the site of venous access. The internal jugular vein was accessed using an introducer needle and a guidewire passed through the needle. The needle was removed and a skin nick was made. The tract was dilated and the central venous catheter advanced over the guidewire followed by removal of the guidewire. All ports drew blood easily and all were flushed with saline. The catheter was secured to the skin with suture and a sterile dressing. The patient tolerated the procedure well. There were no immediate complications. Follow up chest x-ray was ordered to confirm positioning and the absence of a pneumothorax.   Jesusita Oka, MD General and [facility]
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
## 2019-07-31 NOTE — Anesthesia Preprocedure Evaluation (Signed)
Anesthesia Evaluation  Patient identified by MRN, date of birth, ID band Patient unresponsive    Reviewed: Allergy & Precautions, NPO status , Patient's Chart, lab work & pertinent test results  Airway Mallampati: Intubated  TM Distance: >3 FB Neck ROM: Full    Dental   Pulmonary    Pulmonary exam normal        Cardiovascular hypertension, Pt. on medications Normal cardiovascular exam     Neuro/Psych    GI/Hepatic   Endo/Other    Renal/GU      Musculoskeletal   Abdominal   Peds  Hematology  (+) Blood dyscrasia, anemia ,   Anesthesia Other Findings   Reproductive/Obstetrics                             Anesthesia Physical  Anesthesia Plan  ASA: IV  Anesthesia Plan: General   Post-op Pain Management:    Induction: Inhalational  PONV Risk Score and Plan: 2 and Treatment may vary due to age or medical condition  Airway Management Planned: Oral ETT  Additional Equipment:   Intra-op Plan:   Post-operative Plan: Post-operative intubation/ventilation  Informed Consent: I have reviewed the patients History and Physical, chart, labs and discussed the procedure including the risks, benefits and alternatives for the proposed anesthesia with the patient or authorized representative who has indicated his/her understanding and acceptance.       Plan Discussed with: CRNA, Surgeon and Anesthesiologist  Anesthesia Plan Comments:         Anesthesia Quick Evaluation

## 2019-07-31 NOTE — Anesthesia Postprocedure Evaluation (Signed)
Anesthesia Post Note  Patient: [name]  Procedure(s) Performed: RE-EXPLORATORY LAPAROTOMY, RESECTION OF TRANSVERSE, LEFT, AND SIGMOID COLON, CREATION OF ILEOSTOMY,  PRIMARY FASCIAL CLOSURE (N/A Abdomen) Application Of Wound Vac (Abdomen)     Patient location during evaluation: SICU Anesthesia Type: General Level of consciousness: sedated Pain management: pain level controlled Vital Signs Assessment: post-procedure vital signs reviewed and stable Respiratory status: patient remains intubated per anesthesia plan Cardiovascular status: stable Postop Assessment: no apparent nausea or vomiting Anesthetic complications: no    Last Vitals:  Vitals:   07/31/19 1500 07/31/19 1600  BP: (!) 142/62 (!) 134/59  Pulse: (!) 124 (!) 126  Resp: (!) 21 16  Temp:    SpO2: 92% 91%    Last Pain:  Vitals:   07/31/19 1200  TempSrc: Oral  PainSc:                  Tiajuana Amass
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
## 2019-07-31 NOTE — Transfer of Care (Signed)
Immediate Anesthesia Transfer of Care Note  Patient: Moshe Turchetta [facility]  Procedure(s) Performed: RE-EXPLORATORY LAPAROTOMY, RESTORATION OF BOWEL CONTINUITY, PRIMARY FASCIAL CLOSURE (N/A )  Patient Location: PACU and ICU  Anesthesia Type:General  Level of Consciousness: sedated, drowsy, comatose and Patient remains intubated per anesthesia plan  Airway & Oxygen Therapy: Patient remains intubated per anesthesia plan and Patient placed on Ventilator (see vital sign flow sheet for setting)  Post-op Assessment: Report given to RN and Post -op Vital signs reviewed and stable  Post vital signs: Reviewed and stable  Last Vitals:  Vitals Value Taken Time  BP    Temp    Pulse    Resp    SpO2      Last Pain:  Vitals:   07/31/19 0400  TempSrc: Oral  PainSc:          Complications: No apparent anesthesia complications
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
## 2019-08-01 ENCOUNTER — Inpatient Hospital Stay (HOSPITAL_COMMUNITY): Payer: 59

## 2019-08-01 LAB — TYPE AND SCREEN
ABO/RH(D): A POS
Antibody Screen: NEGATIVE
Unit division: 0
Unit division: 0
Unit division: 0
Unit division: 0
Unit division: 0
Unit division: 0
Unit division: 0
Unit division: 0
Unit division: 0
Unit division: 0
Unit division: 0
Unit division: 0
Unit division: 0
Unit division: 0
Unit division: 0
Unit division: 0
Unit division: 0

## 2019-08-01 LAB — BASIC METABOLIC PANEL
Anion gap: 12 (ref 5–15)
Anion gap: 14 (ref 5–15)
BUN: 71 mg/dL — ABNORMAL HIGH (ref 6–20)
BUN: 59 mg/dL — ABNORMAL HIGH (ref 6–20)
CO2: 18 mmol/L — ABNORMAL LOW (ref 22–32)
CO2: 21 mmol/L — ABNORMAL LOW (ref 22–32)
Calcium: 7.1 mg/dL — ABNORMAL LOW (ref 8.9–10.3)
Calcium: 7.4 mg/dL — ABNORMAL LOW (ref 8.9–10.3)
Chloride: 111 mmol/L (ref 98–111)
Chloride: 105 mmol/L (ref 98–111)
Creatinine, Ser: 6.13 mg/dL — ABNORMAL HIGH (ref 0.61–1.24)
Creatinine, Ser: 4.78 mg/dL — ABNORMAL HIGH (ref 0.61–1.24)
GFR calc Af Amer: 11 mL/min — ABNORMAL LOW (ref >60)
GFR calc Af Amer: 14 mL/min — ABNORMAL LOW (ref >60)
GFR calc non Af Amer: 9 mL/min — ABNORMAL LOW (ref >60)
GFR calc non Af Amer: 12 mL/min — ABNORMAL LOW (ref >60)
Glucose, Bld: 116 mg/dL — ABNORMAL HIGH (ref 70–99)
Glucose, Bld: 110 mg/dL — ABNORMAL HIGH (ref 70–99)
Potassium: 5.4 mmol/L — ABNORMAL HIGH (ref 3.5–5.1)
Potassium: 4.7 mmol/L (ref 3.5–5.1)
Sodium: 141 mmol/L (ref 135–145)
Sodium: 140 mmol/L (ref 135–145)

## 2019-08-01 LAB — PREPARE FRESH FROZEN PLASMA
Unit division: 0
Unit division: 0

## 2019-08-01 LAB — BPAM FFP
Blood Product Expiration Date: 202010272359
Blood Product Expiration Date: 202010272359
ISSUE DATE / TIME: 202010240723
ISSUE DATE / TIME: 202010240723
Unit Type and Rh: 8400
Unit Type and Rh: 8400

## 2019-08-01 LAB — POCT I-STAT 7, (LYTES, BLD GAS, ICA,H+H)
Acid-base deficit: 4 mmol/L — ABNORMAL HIGH (ref 0.0–2.0)
Bicarbonate: 21.5 mmol/L (ref 20.0–28.0)
Calcium, Ion: 0.95 mmol/L — ABNORMAL LOW (ref 1.15–1.40)
HCT: 30 % — ABNORMAL LOW (ref 39.0–52.0)
Hemoglobin: 10.2 g/dL — ABNORMAL LOW (ref 13.0–17.0)
O2 Saturation: 95 %
Patient temperature: 97.8
Potassium: 4.6 mmol/L (ref 3.5–5.1)
Sodium: 140 mmol/L (ref 135–145)
TCO2: 23 mmol/L (ref 22–32)
pCO2 arterial: 40.1 mmHg (ref 32.0–48.0)
pH, Arterial: 7.336 — ABNORMAL LOW (ref 7.350–7.450)
pO2, Arterial: 78 mmHg — ABNORMAL LOW (ref 83.0–108.0)

## 2019-08-01 LAB — BPAM RBC
Blood Product Expiration Date: 202011062359
Blood Product Expiration Date: 202011072359
Blood Product Expiration Date: 202011072359
Blood Product Expiration Date: 202011082359
Blood Product Expiration Date: 202011082359
Blood Product Expiration Date: 202011212359
Blood Product Expiration Date: 202011212359
Blood Product Expiration Date: 202011212359
Blood Product Expiration Date: 202011222359
Blood Product Expiration Date: 202011252359
Blood Product Expiration Date: 202011252359
Blood Product Expiration Date: 202011252359
Blood Product Expiration Date: 202011252359
Blood Product Expiration Date: 202011272359
Blood Product Expiration Date: 202011272359
Blood Product Expiration Date: 202011272359
Blood Product Expiration Date: 202011272359
ISSUE DATE / TIME: 202010220525
ISSUE DATE / TIME: 202010220525
ISSUE DATE / TIME: 202010220840
ISSUE DATE / TIME: 202010221110
ISSUE DATE / TIME: 202010221246
ISSUE DATE / TIME: 202010221246
ISSUE DATE / TIME: 202010221246
ISSUE DATE / TIME: 202010221246
ISSUE DATE / TIME: 202010221321
ISSUE DATE / TIME: 202010221321
ISSUE DATE / TIME: 202010221321
ISSUE DATE / TIME: 202010221321
ISSUE DATE / TIME: 202010231836
Unit Type and Rh: 6200
Unit Type and Rh: 6200
Unit Type and Rh: 6200
Unit Type and Rh: 6200
Unit Type and Rh: 6200
Unit Type and Rh: 6200
Unit Type and Rh: 6200
Unit Type and Rh: 6200
Unit Type and Rh: 6200
Unit Type and Rh: 6200
Unit Type and Rh: 6200
Unit Type and Rh: 6200
Unit Type and Rh: 6200
Unit Type and Rh: 6200
Unit Type and Rh: 6200
Unit Type and Rh: 6200
Unit Type and Rh: 6200

## 2019-08-01 LAB — RENAL FUNCTION PANEL
Albumin: 2.3 g/dL — ABNORMAL LOW (ref 3.5–5.0)
Anion gap: 12 (ref 5–15)
BUN: 52 mg/dL — ABNORMAL HIGH (ref 6–20)
CO2: 21 mmol/L — ABNORMAL LOW (ref 22–32)
Calcium: 7.5 mg/dL — ABNORMAL LOW (ref 8.9–10.3)
Chloride: 104 mmol/L (ref 98–111)
Creatinine, Ser: 4.03 mg/dL — ABNORMAL HIGH (ref 0.61–1.24)
GFR calc Af Amer: 18 mL/min — ABNORMAL LOW (ref >60)
GFR calc non Af Amer: 15 mL/min — ABNORMAL LOW (ref >60)
Glucose, Bld: 106 mg/dL — ABNORMAL HIGH (ref 70–99)
Phosphorus: 5.3 mg/dL — ABNORMAL HIGH (ref 2.5–4.6)
Potassium: 4.5 mmol/L (ref 3.5–5.1)
Sodium: 137 mmol/L (ref 135–145)

## 2019-08-01 LAB — CBC
HCT: 32.7 % — ABNORMAL LOW (ref 39.0–52.0)
Hemoglobin: 11.1 g/dL — ABNORMAL LOW (ref 13.0–17.0)
MCH: 31 pg (ref 26.0–34.0)
MCHC: 33.9 g/dL (ref 30.0–36.0)
MCV: 91.3 fL (ref 80.0–100.0)
Platelets: 87 10*3/uL — ABNORMAL LOW (ref 150–400)
RBC: 3.58 MIL/uL — ABNORMAL LOW (ref 4.22–5.81)
RDW: 15.8 % — ABNORMAL HIGH (ref 11.5–15.5)
WBC: 14.8 10*3/uL — ABNORMAL HIGH (ref 4.0–10.5)
nRBC: 0.3 % — ABNORMAL HIGH (ref 0.0–0.2)

## 2019-08-01 LAB — MAGNESIUM: Magnesium: 2.2 mg/dL (ref 1.7–2.4)

## 2019-08-01 LAB — ALBUMIN: Albumin: 2.5 g/dL — ABNORMAL LOW (ref 3.5–5.0)

## 2019-08-01 LAB — PREPARE PLATELET PHERESIS: Unit division: 0

## 2019-08-01 LAB — BPAM PLATELET PHERESIS
Blood Product Expiration Date: 202010262359
ISSUE DATE / TIME: 202010240724
Unit Type and Rh: 6200

## 2019-08-01 LAB — GLUCOSE, CAPILLARY
Glucose-Capillary: 54 mg/dL — ABNORMAL LOW (ref 70–99)
Glucose-Capillary: 84 mg/dL (ref 70–99)

## 2019-08-01 LAB — TRIGLYCERIDES: Triglycerides: 173 mg/dL — ABNORMAL HIGH (ref <150)

## 2019-08-01 LAB — CALCIUM, IONIZED: Calcium, Ionized, Serum: 3.7 mg/dL — ABNORMAL LOW (ref 4.5–5.6)

## 2019-08-01 LAB — PHOSPHORUS: Phosphorus: 5.6 mg/dL — ABNORMAL HIGH (ref 2.5–4.6)

## 2019-08-01 MED ORDER — METOPROLOL TARTRATE 5 MG/5ML IV SOLN
5.0000 mg | Freq: Four times a day (QID) | INTRAVENOUS | Status: DC | PRN
  Administered 2019-08-03 – 2019-08-16 (×16): 5 mg via INTRAVENOUS
  Filled 2019-08-01 (×19): qty 5

## 2019-08-01 NOTE — Progress Notes (Addendum)
Orthopaedic Trauma Service F/U  X-rays of right ankle show right bimalleolar ankle fracture We will have patient placed in splint. Would recommend operative intervention Check x-rays of right knee   Jari Pigg, PA-C [phone] (C) 08/01/2019, 4:19 PM  [facility] [place] [address] [phone] [name] (F)   After 6pm on weekdays please call office number to get in touch with on call provider or refer to [place] and look to see who is on call for the Sports Medicine Call Group which is listed under orthopaedics   On Weekends please call office number to get in touch with on call provider or refer to [place] and look to see who is on call for the Sports Medicine Call Group which is listed under orthopaedics

## 2019-08-01 NOTE — Procedures (Signed)
I evaluated the patient while on CRRT and discussed with bedside RN.  Dialysis is running without issue. 
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
## 2019-08-01 NOTE — Progress Notes (Signed)
[facility]    NEPHROLOGY PROGRESS NOTE  SUBJECTIVE: Patient seen and examined.  No acute events.  Sedated, but does move spontaneously.  Unable to provide review of systems.  OBJECTIVE:  Vitals:   08/01/19 1400 08/01/19 1500  BP: (!) 109/54 (!) 103/46  Pulse: (!) 110 (!) 107  Resp: (!) 22 17  Temp:    SpO2: 100% 98%    Intake/Output Summary (Last 24 hours) at 08/01/2019 1541 Last data filed at 08/01/2019 1500 Gross per 24 hour  Intake 798.32 ml  Output 3559 ml  Net -2760.68 ml     General: Intubated, sedated NAD HEENT: MMM [place] AT anicteric sclera, on ventilator Neck:  No JVD, no adenopathy CV:  Heart RRR  Lungs:  L/S CTA bilaterally Abd:  abd not distended, quiet, positive ostomy GU:  Bladder non-palpable Extremities:  No LE edema. Skin:  No skin rash Psych: Sedated  Neuro:  no focal deficits  MEDICATIONS:  . chlorhexidine gluconate (MEDLINE KIT)  15 mL Mouth Rinse BID  . Chlorhexidine Gluconate Cloth  6 each Topical Daily  . fentaNYL (SUBLIMAZE) injection  50 mcg Intravenous Once  . heparin  3,000 Units Intravenous Once  . lip balm  1 application Topical BID  . mouth rinse  15 mL Mouth Rinse 10 times per day  . methocarbamol  500 mg Per Tube Q8H  . pantoprazole  40 mg Oral Daily   Or  . pantoprazole (PROTONIX) IV  40 mg Intravenous Daily       LABS:   CBC Latest Ref Rng & Units 08/01/2019 08/01/2019 07/31/2019  WBC 4.0 - 10.5 K/uL 14.8(H) - 11.8(H)  Hemoglobin 13.0 - 17.0 g/dL 11.1(L) 10.2(L) 10.8(L)  Hematocrit 39.0 - 52.0 % 32.7(L) 30.0(L) 32.4(L)  Platelets 150 - 400 K/uL 87(L) - 85(L)    CMP Latest Ref Rng & Units 08/01/2019 08/01/2019 07/31/2019  Glucose 70 - 99 mg/dL 110(H) - 115(H)  BUN 6 - 20 mg/dL 59(H) - 75(H)  Creatinine 0.61 - 1.24 mg/dL 4.78(H) - 6.34(H)  Sodium 135 - 145 mmol/L 140 140 143  Potassium 3.5 - 5.1 mmol/L 4.7 4.6 6.0(H)  Chloride 98 - 111 mmol/L 105 - 111  CO2 22 - 32 mmol/L 21(L) - 18(L)  Calcium 8.9 -  10.3 mg/dL 7.4(L) - 7.0(L)  Total Protein 6.5 - 8.1 g/dL - - -  Total Bilirubin 0.3 - 1.2 mg/dL - - -  Alkaline Phos 38 - 126 U/L - - -  AST 15 - 41 U/L - - -  ALT 0 - 44 U/L - - -    Lab Results  Component Value Date   CALCIUM 7.4 (L) 08/01/2019   CAION 0.95 (L) 08/01/2019   PHOS 5.6 (H) 08/01/2019    No results found for: COLORURINE, APPEARANCEUR, LABSPEC, PHURINE, GLUCOSEU, HGBUR, BILIRUBINUR, KETONESUR, PROTEINUR, UROBILINOGEN, NITRITE, LEUKOCYTESUR    Component Value Date/Time   PHART 7.336 (L) 08/01/2019 0332   PCO2ART 40.1 08/01/2019 0332   PO2ART 78.0 (L) 08/01/2019 0332   HCO3 21.5 08/01/2019 0332   TCO2 23 08/01/2019 0332   ACIDBASEDEF 4.0 (H) 08/01/2019 0332   O2SAT 95.0 08/01/2019 0332    No results found for: IRON, TIBC, FERRITIN, IRONPCTSAT     ASSESSMENT/PLAN:    1.  Baseline serum creatinine 1.0 on admission.  2.  Acute kidney injury due to ATN.  Likely secondary to hemodynamic insults.  Minimal urine output.  We continue CRRT today.  Discussed with wife.  Doubt this will be  long-term dialysis.  Will increase ultrafiltration rate to a net of 254m/h.  3.  Hyperkalemia.    Improved with dialysis  4.  Shock liver.  Continue supportive care.  5.  Metabolic acidosis.    Improved with dialysis  6.  Status post colectomy.  Per trauma surgery.  7.  Hyperphosphatemia.  Coming down with CRRT.    [place] DO, [place]

## 2019-08-01 NOTE — Progress Notes (Signed)
Orthopedic Tech Progress Note Patient Details:  Trapper Going [facility] Jul 31, 1961 [id number] Applied short leg with RN at bedside and she held while I applied. Ortho Devices Type of Ortho Device: Short leg splint Ortho Device/Splint Location: LRE Ortho Device/Splint Interventions: Application, Ordered   Post Interventions Patient Tolerated: Well Instructions Provided: Care of device, Adjustment of device   Janit Pagan 08/01/2019, 5:41 PM

## 2019-08-02 ENCOUNTER — Encounter (HOSPITAL_COMMUNITY): Payer: Self-pay | Admitting: Surgery

## 2019-08-02 LAB — GLUCOSE, CAPILLARY
Glucose-Capillary: 55 mg/dL — ABNORMAL LOW (ref 70–99)
Glucose-Capillary: 91 mg/dL (ref 70–99)
Glucose-Capillary: 118 mg/dL — ABNORMAL HIGH (ref 70–99)
Glucose-Capillary: 105 mg/dL — ABNORMAL HIGH (ref 70–99)
Glucose-Capillary: 50 mg/dL — ABNORMAL LOW (ref 70–99)
Glucose-Capillary: 92 mg/dL (ref 70–99)

## 2019-08-02 LAB — RENAL FUNCTION PANEL
Albumin: 2.3 g/dL — ABNORMAL LOW (ref 3.5–5.0)
Albumin: 1.4 g/dL — ABNORMAL LOW (ref 3.5–5.0)
Anion gap: 11 (ref 5–15)
Anion gap: 8 (ref 5–15)
BUN: 56 mg/dL — ABNORMAL HIGH (ref 6–20)
BUN: 48 mg/dL — ABNORMAL HIGH (ref 6–20)
CO2: 24 mmol/L (ref 22–32)
CO2: 16 mmol/L — ABNORMAL LOW (ref 22–32)
Calcium: 7.6 mg/dL — ABNORMAL LOW (ref 8.9–10.3)
Calcium: 5.2 mg/dL — CL (ref 8.9–10.3)
Chloride: 102 mmol/L (ref 98–111)
Chloride: 117 mmol/L — ABNORMAL HIGH (ref 98–111)
Creatinine, Ser: 3.98 mg/dL — ABNORMAL HIGH (ref 0.61–1.24)
Creatinine, Ser: 2.84 mg/dL — ABNORMAL HIGH (ref 0.61–1.24)
GFR calc Af Amer: 18 mL/min — ABNORMAL LOW (ref >60)
GFR calc Af Amer: 27 mL/min — ABNORMAL LOW (ref >60)
GFR calc non Af Amer: 15 mL/min — ABNORMAL LOW (ref >60)
GFR calc non Af Amer: 23 mL/min — ABNORMAL LOW (ref >60)
Glucose, Bld: 102 mg/dL — ABNORMAL HIGH (ref 70–99)
Glucose, Bld: 85 mg/dL (ref 70–99)
Phosphorus: 5.7 mg/dL — ABNORMAL HIGH (ref 2.5–4.6)
Phosphorus: 3.8 mg/dL (ref 2.5–4.6)
Potassium: 4.4 mmol/L (ref 3.5–5.1)
Potassium: 3.1 mmol/L — ABNORMAL LOW (ref 3.5–5.1)
Sodium: 137 mmol/L (ref 135–145)
Sodium: 141 mmol/L (ref 135–145)

## 2019-08-02 LAB — CBC
HCT: 30.9 % — ABNORMAL LOW (ref 39.0–52.0)
Hemoglobin: 10.3 g/dL — ABNORMAL LOW (ref 13.0–17.0)
MCH: 30.6 pg (ref 26.0–34.0)
MCHC: 33.3 g/dL (ref 30.0–36.0)
MCV: 91.7 fL (ref 80.0–100.0)
Platelets: 102 10*3/uL — ABNORMAL LOW (ref 150–400)
RBC: 3.37 MIL/uL — ABNORMAL LOW (ref 4.22–5.81)
RDW: 15.7 % — ABNORMAL HIGH (ref 11.5–15.5)
WBC: 13.2 10*3/uL — ABNORMAL HIGH (ref 4.0–10.5)
nRBC: 0.6 % — ABNORMAL HIGH (ref 0.0–0.2)

## 2019-08-02 LAB — MAGNESIUM: Magnesium: 2.9 mg/dL — ABNORMAL HIGH (ref 1.7–2.4)

## 2019-08-02 LAB — SURGICAL PATHOLOGY

## 2019-08-02 LAB — VITAMIN D 25 HYDROXY (VIT D DEFICIENCY, FRACTURES): Vit D, 25-Hydroxy: 22.65 ng/mL — ABNORMAL LOW (ref 30–100)

## 2019-08-02 LAB — BASIC METABOLIC PANEL
Anion gap: 12 (ref 5–15)
BUN: 54 mg/dL — ABNORMAL HIGH (ref 6–20)
CO2: 22 mmol/L (ref 22–32)
Calcium: 7.6 mg/dL — ABNORMAL LOW (ref 8.9–10.3)
Chloride: 102 mmol/L (ref 98–111)
Creatinine, Ser: 3.89 mg/dL — ABNORMAL HIGH (ref 0.61–1.24)
GFR calc Af Amer: 18 mL/min — ABNORMAL LOW (ref >60)
GFR calc non Af Amer: 16 mL/min — ABNORMAL LOW (ref >60)
Glucose, Bld: 101 mg/dL — ABNORMAL HIGH (ref 70–99)
Potassium: 4.3 mmol/L (ref 3.5–5.1)
Sodium: 136 mmol/L (ref 135–145)

## 2019-08-02 MED ORDER — PHENYLEPHRINE HCL-NACL 10-0.9 MG/250ML-% IV SOLN
INTRAVENOUS | Status: AC
  Administered 2019-08-02: 60 mg
  Filled 2019-08-02: qty 250

## 2019-08-02 MED ORDER — OXYCODONE HCL 5 MG/5ML PO SOLN
5.0000 mg | ORAL | Status: DC | PRN
  Administered 2019-08-02 – 2019-08-18 (×36): 10 mg
  Administered 2019-08-18: 5 mg
  Administered 2019-08-18 – 2019-08-21 (×6): 10 mg
  Administered 2019-08-22 (×2): 5 mg
  Administered 2019-08-22 – 2019-08-23 (×4): 10 mg
  Filled 2019-08-02 (×19): qty 10
  Filled 2019-08-02: qty 5
  Filled 2019-08-02 (×30): qty 10

## 2019-08-02 MED ORDER — DEXTROSE 50 % IV SOLN
INTRAVENOUS | Status: AC
  Administered 2019-08-02: 25 mL via INTRAVENOUS
  Filled 2019-08-02: qty 50

## 2019-08-02 MED ORDER — METHOCARBAMOL 500 MG PO TABS
1000.0000 mg | ORAL_TABLET | Freq: Three times a day (TID) | ORAL | Status: DC
  Administered 2019-08-02 – 2019-08-22 (×61): 1000 mg
  Filled 2019-08-02 (×60): qty 2

## 2019-08-02 MED ORDER — PRO-STAT SUGAR FREE PO LIQD
30.0000 mL | Freq: Two times a day (BID) | ORAL | Status: DC
  Administered 2019-08-02: 30 mL
  Filled 2019-08-02: qty 30

## 2019-08-02 MED ORDER — PHENYLEPHRINE HCL-NACL 40-0.9 MG/250ML-% IV SOLN
0.0000 ug/min | INTRAVENOUS | Status: DC
  Administered 2019-08-02: 40 ug/min via INTRAVENOUS
  Filled 2019-08-02: qty 250

## 2019-08-02 MED ORDER — HEPARIN SODIUM (PORCINE) 5000 UNIT/ML IJ SOLN
5000.0000 [IU] | Freq: Three times a day (TID) | INTRAMUSCULAR | Status: DC
  Administered 2019-08-02 – 2019-08-14 (×39): 5000 [IU] via SUBCUTANEOUS
  Filled 2019-08-02 (×39): qty 1

## 2019-08-02 MED ORDER — PRISMASOL BGK 4/2.5 32-4-2.5 MEQ/L IV SOLN
INTRAVENOUS | Status: DC
  Administered 2019-08-02 – 2019-08-06 (×14): via INTRAVENOUS_CENTRAL
  Filled 2019-08-02 (×20): qty 5000

## 2019-08-02 MED ORDER — DEXTROSE 50 % IV SOLN
25.0000 mL | Freq: Once | INTRAVENOUS | Status: AC
  Administered 2019-08-02: 12:00:00 25 mL via INTRAVENOUS

## 2019-08-02 MED ORDER — PRISMASOL BGK 4/2.5 32-4-2.5 MEQ/L REPLACEMENT SOLN
Status: DC
  Administered 2019-08-02 – 2019-08-05 (×7): via INTRAVENOUS_CENTRAL
  Filled 2019-08-02 (×6): qty 5000

## 2019-08-02 MED ORDER — SODIUM CHLORIDE 0.9 % IV SOLN
1.0000 g | Freq: Once | INTRAVENOUS | Status: AC
  Administered 2019-08-02: 1 g via INTRAVENOUS
  Filled 2019-08-02: qty 10

## 2019-08-02 MED ORDER — PIVOT 1.5 CAL PO LIQD
1000.0000 mL | ORAL | Status: DC
  Administered 2019-08-02 – 2019-08-05 (×4): 1000 mL
  Filled 2019-08-02 (×4): qty 1000

## 2019-08-02 MED ORDER — PRO-STAT SUGAR FREE PO LIQD
60.0000 mL | Freq: Three times a day (TID) | ORAL | Status: DC
  Administered 2019-08-02 – 2019-08-05 (×9): 60 mL
  Filled 2019-08-02 (×9): qty 60

## 2019-08-02 MED ORDER — PRISMASOL BGK 4/2.5 32-4-2.5 MEQ/L REPLACEMENT SOLN
Status: DC
  Administered 2019-08-02 – 2019-08-06 (×8): via INTRAVENOUS_CENTRAL
  Filled 2019-08-02 (×10): qty 5000

## 2019-08-02 NOTE — Progress Notes (Addendum)
Trauma Critical Care Follow Up Note  Subjective:    Overnight Issues: NAEON  Objective:  Vital signs for last 24 hours: Temp:  [99 F (37.2 C)-99.9 F (37.7 C)] 99.2 F (37.3 C) (10/26 0800) Pulse Rate:  [102-127] 127 (10/26 0801) Resp:  [9-26] 14 (10/26 0801) BP: (86-182)/(40-77) 182/77 (10/26 0801) SpO2:  [89 %-100 %] 100 % (10/26 0801) Arterial Line BP: (99-155)/(40-61) 144/61 (10/26 0700) FiO2 (%):  [40 %-50 %] 40 % (10/26 0825) Weight:  [91.9 kg] 91.9 kg (10/26 0500)  Hemodynamic parameters for last 24 hours:    Intake/Output from previous day: 10/25 0701 - 10/26 0700 In: 826 [I.V.:736; NG/GT:70] Out: 5499 [Urine:158; Emesis/NG output:725; Stool:15]  Intake/Output this shift: Total I/O In: 29.7 [I.V.:29.7] Out: 157 [Urine:20; Emesis/NG output:10; Other:127]  Vent settings for last 24 hours: Vent Mode: PRVC FiO2 (%):  [40 %-50 %] 40 % Set Rate:  [20 bmp] 20 bmp Vt Set:  [580 mL] 580 mL PEEP:  [5 cmH20] 5 cmH20 Pressure Support:  [5 cmH20] 5 cmH20 Plateau Pressure:  [14 cmH20-20 cmH20] 14 cmH20  Physical Exam:  Gen: comfortable Neuro: grossly non-focal, follows commands HEENT: intubated CV: mildly tachycardic Pulm: unlabored breathing, mechanically ventilated Abd: soft, nontender, incisional vac in place with negligible o/p, ostomy productive GU: CRRT Extr: palpable DP b/l, 1+ edema  Results for orders placed or performed during the hospital encounter of 07/28/19 (from the past 24 hour(s))  Renal function panel (daily at 1600)     Status: Abnormal   Collection Time: 08/01/19  4:30 PM  Result Value Ref Range   Sodium 137 135 - 145 mmol/L   Potassium 4.5 3.5 - 5.1 mmol/L   Chloride 104 98 - 111 mmol/L   CO2 21 (L) 22 - 32 mmol/L   Glucose, Bld 106 (H) 70 - 99 mg/dL   BUN 52 (H) 6 - 20 mg/dL   Creatinine, Ser 4.03 (H) 0.61 - 1.24 mg/dL   Calcium 7.5 (L) 8.9 - 10.3 mg/dL   Phosphorus 5.3 (H) 2.5 - 4.6 mg/dL   Albumin 2.3 (L) 3.5 - 5.0 g/dL   GFR  calc non Af Amer 15 (L) >60 mL/min   GFR calc Af Amer 18 (L) >60 mL/min   Anion gap 12 5 - 15  Glucose, capillary     Status: Abnormal   Collection Time: 08/01/19 11:23 PM  Result Value Ref Range   Glucose-Capillary 54 (L) 70 - 99 mg/dL  Glucose, capillary     Status: None   Collection Time: 08/01/19 11:26 PM  Result Value Ref Range   Glucose-Capillary 84 70 - 99 mg/dL  Basic metabolic panel     Status: Abnormal   Collection Time: 08/02/19 12:18 AM  Result Value Ref Range   Sodium 136 135 - 145 mmol/L   Potassium 4.3 3.5 - 5.1 mmol/L   Chloride 102 98 - 111 mmol/L   CO2 22 22 - 32 mmol/L   Glucose, Bld 101 (H) 70 - 99 mg/dL   BUN 54 (H) 6 - 20 mg/dL   Creatinine, Ser 3.89 (H) 0.61 - 1.24 mg/dL   Calcium 7.6 (L) 8.9 - 10.3 mg/dL   GFR calc non Af Amer 16 (L) >60 mL/min   GFR calc Af Amer 18 (L) >60 mL/min   Anion gap 12 5 - 15  Magnesium     Status: Abnormal   Collection Time: 08/02/19  5:34 AM  Result Value Ref Range   Magnesium 2.9 (H) 1.7 -  2.4 mg/dL  CBC     Status: Abnormal   Collection Time: 08/02/19  5:34 AM  Result Value Ref Range   WBC 13.2 (H) 4.0 - 10.5 K/uL   RBC 3.37 (L) 4.22 - 5.81 MIL/uL   Hemoglobin 10.3 (L) 13.0 - 17.0 g/dL   HCT 30.9 (L) 39.0 - 52.0 %   MCV 91.7 80.0 - 100.0 fL   MCH 30.6 26.0 - 34.0 pg   MCHC 33.3 30.0 - 36.0 g/dL   RDW 15.7 (H) 11.5 - 15.5 %   Platelets 102 (L) 150 - 400 K/uL   nRBC 0.6 (H) 0.0 - 0.2 %  Renal function panel (daily at 0500)     Status: Abnormal   Collection Time: 08/02/19  5:35 AM  Result Value Ref Range   Sodium 137 135 - 145 mmol/L   Potassium 4.4 3.5 - 5.1 mmol/L   Chloride 102 98 - 111 mmol/L   CO2 24 22 - 32 mmol/L   Glucose, Bld 102 (H) 70 - 99 mg/dL   BUN 56 (H) 6 - 20 mg/dL   Creatinine, Ser 3.98 (H) 0.61 - 1.24 mg/dL   Calcium 7.6 (L) 8.9 - 10.3 mg/dL   Phosphorus 5.7 (H) 2.5 - 4.6 mg/dL   Albumin 2.3 (L) 3.5 - 5.0 g/dL   GFR calc non Af Amer 15 (L) >60 mL/min   GFR calc Af Amer 18 (L) >60 mL/min    Anion gap 11 5 - 15    Assessment & Plan: Present on Admission: . Traumatic hemoperitoneum    LOS: 5 days   Additional comments:I reviewed the patient's new clinical lab test results.   and I reviewed the patients new imaging test results.    [name]  Ileal mesenteric laceration, ileocecal mesenteric laceration, rectosigmoid mesenteric laceration - S/P ileocecectomy and repair rectosigmoid mesenteric laceration by Dr. Johney Maine 10/21, s/p takeback with Dr. Grand[place] Silos 10/22 for resection of bleeding ileocolonic anastomosis and abdominal packing, s/p takeback for pack removal, completion colectomy, ileostomy formation, and fascial closure 10/24 with Dr. Bobbye Morton. ROBF--start TF today via NG. Vac change 10/27. R femur FX - S/P IM nail by Dr. Marlou Sa 10/22 Hypotension - resolved, off pressors L clavicle and L radius FX - OR early next week with Dr. Doreatha Martin R ankle fracture - identified 10/25, plan for OR 10/28 (Dr. Eddie Dibbles). NWB until surgery.  Acute hypoxic ventilator dependent respiratory failure - PSV trial today with tachypnea and tachycardia. Will try again with possible extubation this PM. AKI on CRRT - net negative 4.6L, would continue fluid removal as tolerated to 3L of more negative today, remains anuric--monitor Transaminitis - recheck LFTs in AM, avoid hepatotoxic meds Hemorrhagic shock and ABL anemia - hgb stable Thrombocytopenia - continue to follow FEN - d/c MIVF LR VTE - SQH today--CRRT circuit not heparinized Dispo - ICU  Called wife at [phone] provided an update. She is grateful for his forward progress.   Critical Care Total Time: 45 minutes  Jesusita Oka, MD Trauma & General Surgery Please use [email or web address] to contact on call provider  08/02/2019  *Care during the described time interval was provided by me. I have reviewed this patient's available data, including medical history, events of note, physical examination and test results as part of my evaluation.

## 2019-08-02 NOTE — Procedures (Signed)
I evaluated the patient while on CRRT and discussed with bedside RN.  Dialysis is running without issue. 
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
## 2019-08-02 NOTE — Progress Notes (Addendum)
CRITICAL VALUE ALERT  Critical Value:  Calcium 5.2  Date & Time Notied:  08/02/19 1937  Provider Notified: Dr. Marval Regal, Nephrology MD  Orders Received/Actions taken: New orders for Dialysate fluids, changed to 2K/2.5Ca. IVPB Calcium

## 2019-08-03 LAB — GLUCOSE, CAPILLARY
Glucose-Capillary: 110 mg/dL — ABNORMAL HIGH (ref 70–99)
Glucose-Capillary: 120 mg/dL — ABNORMAL HIGH (ref 70–99)
Glucose-Capillary: 129 mg/dL — ABNORMAL HIGH (ref 70–99)
Glucose-Capillary: 125 mg/dL — ABNORMAL HIGH (ref 70–99)
Glucose-Capillary: 138 mg/dL — ABNORMAL HIGH (ref 70–99)
Glucose-Capillary: 123 mg/dL — ABNORMAL HIGH (ref 70–99)

## 2019-08-03 LAB — URINALYSIS, ROUTINE W REFLEX MICROSCOPIC
Bilirubin Urine: NEGATIVE
Glucose, UA: NEGATIVE mg/dL
Ketones, ur: NEGATIVE mg/dL
Nitrite: NEGATIVE
Protein, ur: 100 mg/dL — AB
RBC / HPF: >50 RBC/hpf — ABNORMAL HIGH (ref 0–5)
Specific Gravity, Urine: 1.02 (ref 1.005–1.030)
WBC, UA: >50 WBC/hpf — ABNORMAL HIGH (ref 0–5)
pH: 5 (ref 5.0–8.0)

## 2019-08-03 LAB — RENAL FUNCTION PANEL
Albumin: 2.1 g/dL — ABNORMAL LOW (ref 3.5–5.0)
Albumin: 2.1 g/dL — ABNORMAL LOW (ref 3.5–5.0)
Anion gap: 11 (ref 5–15)
Anion gap: 12 (ref 5–15)
BUN: 71 mg/dL — ABNORMAL HIGH (ref 6–20)
BUN: 74 mg/dL — ABNORMAL HIGH (ref 6–20)
CO2: 22 mmol/L (ref 22–32)
CO2: 22 mmol/L (ref 22–32)
Calcium: 7.8 mg/dL — ABNORMAL LOW (ref 8.9–10.3)
Calcium: 7.8 mg/dL — ABNORMAL LOW (ref 8.9–10.3)
Chloride: 103 mmol/L (ref 98–111)
Chloride: 103 mmol/L (ref 98–111)
Creatinine, Ser: 3.9 mg/dL — ABNORMAL HIGH (ref 0.61–1.24)
Creatinine, Ser: 3.63 mg/dL — ABNORMAL HIGH (ref 0.61–1.24)
GFR calc Af Amer: 18 mL/min — ABNORMAL LOW (ref >60)
GFR calc Af Amer: 20 mL/min — ABNORMAL LOW (ref >60)
GFR calc non Af Amer: 16 mL/min — ABNORMAL LOW (ref >60)
GFR calc non Af Amer: 17 mL/min — ABNORMAL LOW (ref >60)
Glucose, Bld: 119 mg/dL — ABNORMAL HIGH (ref 70–99)
Glucose, Bld: 148 mg/dL — ABNORMAL HIGH (ref 70–99)
Phosphorus: 4.5 mg/dL (ref 2.5–4.6)
Phosphorus: 4 mg/dL (ref 2.5–4.6)
Potassium: 4.2 mmol/L (ref 3.5–5.1)
Potassium: 4.2 mmol/L (ref 3.5–5.1)
Sodium: 136 mmol/L (ref 135–145)
Sodium: 137 mmol/L (ref 135–145)

## 2019-08-03 LAB — BASIC METABOLIC PANEL
Anion gap: 10 (ref 5–15)
BUN: 67 mg/dL — ABNORMAL HIGH (ref 6–20)
CO2: 22 mmol/L (ref 22–32)
Calcium: 7.9 mg/dL — ABNORMAL LOW (ref 8.9–10.3)
Chloride: 104 mmol/L (ref 98–111)
Creatinine, Ser: 3.98 mg/dL — ABNORMAL HIGH (ref 0.61–1.24)
GFR calc Af Amer: 18 mL/min — ABNORMAL LOW (ref >60)
GFR calc non Af Amer: 15 mL/min — ABNORMAL LOW (ref >60)
Glucose, Bld: 119 mg/dL — ABNORMAL HIGH (ref 70–99)
Potassium: 4.2 mmol/L (ref 3.5–5.1)
Sodium: 136 mmol/L (ref 135–145)

## 2019-08-03 LAB — CBC
HCT: 30.6 % — ABNORMAL LOW (ref 39.0–52.0)
Hemoglobin: 9.8 g/dL — ABNORMAL LOW (ref 13.0–17.0)
MCH: 30.2 pg (ref 26.0–34.0)
MCHC: 32 g/dL (ref 30.0–36.0)
MCV: 94.2 fL (ref 80.0–100.0)
Platelets: 119 10*3/uL — ABNORMAL LOW (ref 150–400)
RBC: 3.25 MIL/uL — ABNORMAL LOW (ref 4.22–5.81)
RDW: 15.5 % (ref 11.5–15.5)
WBC: 15.7 10*3/uL — ABNORMAL HIGH (ref 4.0–10.5)
nRBC: 0.7 % — ABNORMAL HIGH (ref 0.0–0.2)

## 2019-08-03 LAB — PHOSPHORUS: Phosphorus: 4.4 mg/dL (ref 2.5–4.6)

## 2019-08-03 LAB — HEPATIC FUNCTION PANEL
ALT: 845 U/L — ABNORMAL HIGH (ref 0–44)
AST: 439 U/L — ABNORMAL HIGH (ref 15–41)
Albumin: 2.1 g/dL — ABNORMAL LOW (ref 3.5–5.0)
Alkaline Phosphatase: 89 U/L (ref 38–126)
Bilirubin, Direct: 1.1 mg/dL — ABNORMAL HIGH (ref 0.0–0.2)
Indirect Bilirubin: 0.7 mg/dL (ref 0.3–0.9)
Total Bilirubin: 1.8 mg/dL — ABNORMAL HIGH (ref 0.3–1.2)
Total Protein: 5.3 g/dL — ABNORMAL LOW (ref 6.5–8.1)

## 2019-08-03 LAB — PROTIME-INR
INR: 1.1 (ref 0.8–1.2)
Prothrombin Time: 13.9 seconds (ref 11.4–15.2)

## 2019-08-03 LAB — SURGICAL PATHOLOGY

## 2019-08-03 LAB — MAGNESIUM: Magnesium: 2.9 mg/dL — ABNORMAL HIGH (ref 1.7–2.4)

## 2019-08-03 MED ORDER — CHLORHEXIDINE GLUCONATE CLOTH 2 % EX PADS
6.0000 | MEDICATED_PAD | Freq: Every day | CUTANEOUS | Status: DC
  Administered 2019-08-04 – 2019-08-05 (×2): 6 via TOPICAL

## 2019-08-03 MED ORDER — ACETAMINOPHEN 160 MG/5ML PO SOLN
325.0000 mg | Freq: Four times a day (QID) | ORAL | Status: DC | PRN
  Administered 2019-08-04 – 2019-08-24 (×6): 325 mg via ORAL
  Filled 2019-08-03 (×8): qty 20.3

## 2019-08-03 MED ORDER — INFLUENZA VAC SPLIT QUAD 0.5 ML IM SUSY
0.5000 mL | PREFILLED_SYRINGE | INTRAMUSCULAR | Status: DC
  Filled 2019-08-03: qty 0.5

## 2019-08-03 MED ORDER — NOREPINEPHRINE 16 MG/250ML-% IV SOLN: 0.0000 ug/min | INTRAVENOUS | Status: DC

## 2019-08-03 MED ORDER — NOREPINEPHRINE 4 MG/250ML-% IV SOLN: 0.0000 ug/min | INTRAVENOUS | Status: DC

## 2019-08-03 NOTE — Progress Notes (Signed)
Patient has received all PRN pain medicine he can have, fentanyl gtt restarted before patient flipped back to full support. Patient fighting ventilator, blood pressures rising up to 180s SBP at times. Patient breathing fast and tachycardic up to 120s at times. Propofol will be restarted at low dose to keep patient comfortable. Will continue to give PRN medicine when patient can have them again.

## 2019-08-03 NOTE — Procedures (Signed)
Cortrak  Person Inserting Tube:  Willey Blade E, RD Tube Type:  Cortrak - 43 inches Tube Location:  Left nare Initial Placement:  Stomach Secured by: Bridle Technique Used to Measure Tube Placement:  Documented cm marking at nare/ corner of mouth Cortrak Secured At:  70 cm    Cortrak Tube Team Note:  Consult received to place a Cortrak feeding tube.   No x-ray is required. RN may begin using tube.   If the tube becomes dislodged please keep the tube and contact the Cortrak team at [email or web address] ([email or web address]) for replacement.  If after hours and replacement cannot be delayed, place a NG tube and confirm placement with an abdominal x-ray.    Willey Blade, MS, [place], LDN Office: [phone] Pager: [phone] After Hours/Weekend Pager: [phone]
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
## 2019-08-04 ENCOUNTER — Inpatient Hospital Stay (HOSPITAL_COMMUNITY): Payer: 59

## 2019-08-04 ENCOUNTER — Encounter (HOSPITAL_COMMUNITY): Admission: EM | Disposition: A | Discharge status: Rehabilitation Facility | Payer: Self-pay | Source: Home / Self Care

## 2019-08-04 ENCOUNTER — Inpatient Hospital Stay (HOSPITAL_COMMUNITY): Payer: 59 | Admitting: Certified Registered Nurse Anesthetist

## 2019-08-04 HISTORY — PX: ORIF CLAVICULAR FRACTURE: SHX5055

## 2019-08-04 HISTORY — PX: ORIF WRIST FRACTURE: SHX2133

## 2019-08-04 HISTORY — PX: ORIF ANKLE FRACTURE: SHX5408

## 2019-08-04 LAB — RENAL FUNCTION PANEL
Albumin: 2.1 g/dL — ABNORMAL LOW (ref 3.5–5.0)
Albumin: 2.2 g/dL — ABNORMAL LOW (ref 3.5–5.0)
Anion gap: 13 (ref 5–15)
Anion gap: 14 (ref 5–15)
BUN: 79 mg/dL — ABNORMAL HIGH (ref 6–20)
BUN: 89 mg/dL — ABNORMAL HIGH (ref 6–20)
CO2: 22 mmol/L (ref 22–32)
CO2: 21 mmol/L — ABNORMAL LOW (ref 22–32)
Calcium: 8.1 mg/dL — ABNORMAL LOW (ref 8.9–10.3)
Calcium: 7.6 mg/dL — ABNORMAL LOW (ref 8.9–10.3)
Chloride: 102 mmol/L (ref 98–111)
Chloride: 101 mmol/L (ref 98–111)
Creatinine, Ser: 3.54 mg/dL — ABNORMAL HIGH (ref 0.61–1.24)
Creatinine, Ser: 3.67 mg/dL — ABNORMAL HIGH (ref 0.61–1.24)
GFR calc Af Amer: 21 mL/min — ABNORMAL LOW (ref >60)
GFR calc Af Amer: 20 mL/min — ABNORMAL LOW (ref >60)
GFR calc non Af Amer: 18 mL/min — ABNORMAL LOW (ref >60)
GFR calc non Af Amer: 17 mL/min — ABNORMAL LOW (ref >60)
Glucose, Bld: 137 mg/dL — ABNORMAL HIGH (ref 70–99)
Glucose, Bld: 150 mg/dL — ABNORMAL HIGH (ref 70–99)
Phosphorus: 4.4 mg/dL (ref 2.5–4.6)
Phosphorus: 6 mg/dL — ABNORMAL HIGH (ref 2.5–4.6)
Potassium: 4.7 mmol/L (ref 3.5–5.1)
Potassium: 5 mmol/L (ref 3.5–5.1)
Sodium: 137 mmol/L (ref 135–145)
Sodium: 136 mmol/L (ref 135–145)

## 2019-08-04 LAB — TYPE AND SCREEN
ABO/RH(D): A POS
Antibody Screen: NEGATIVE

## 2019-08-04 LAB — GLUCOSE, CAPILLARY
Glucose-Capillary: 92 mg/dL (ref 70–99)
Glucose-Capillary: 96 mg/dL (ref 70–99)
Glucose-Capillary: 116 mg/dL — ABNORMAL HIGH (ref 70–99)
Glucose-Capillary: 144 mg/dL — ABNORMAL HIGH (ref 70–99)
Glucose-Capillary: 134 mg/dL — ABNORMAL HIGH (ref 70–99)

## 2019-08-04 LAB — MAGNESIUM: Magnesium: 2.8 mg/dL — ABNORMAL HIGH (ref 1.7–2.4)

## 2019-08-04 LAB — TRIGLYCERIDES: Triglycerides: 300 mg/dL — ABNORMAL HIGH (ref <150)

## 2019-08-04 SURGERY — OPEN REDUCTION INTERNAL FIXATION (ORIF) CLAVICULAR FRACTURE
Anesthesia: General | Site: Wrist | Laterality: Right

## 2019-08-04 MED ORDER — LACTATED RINGERS IV SOLN
INTRAVENOUS | Status: DC | PRN
  Administered 2019-08-04: 10:00:00 via INTRAVENOUS

## 2019-08-04 MED ORDER — CHLORHEXIDINE GLUCONATE 0.12 % MT SOLN
15.0000 mL | Freq: Two times a day (BID) | OROMUCOSAL | Status: DC
  Administered 2019-08-04 – 2019-09-01 (×55): 15 mL via OROMUCOSAL
  Filled 2019-08-04 (×40): qty 15

## 2019-08-04 MED ORDER — 0.9 % SODIUM CHLORIDE (POUR BTL) OPTIME
TOPICAL | Status: DC | PRN
  Administered 2019-08-04: 1000 mL

## 2019-08-04 MED ORDER — FENTANYL CITRATE (PF) 250 MCG/5ML IJ SOLN
INTRAMUSCULAR | Status: DC | PRN
  Administered 2019-08-04: 100 ug via INTRAVENOUS

## 2019-08-04 MED ORDER — PHENYLEPHRINE HCL-NACL 10-0.9 MG/250ML-% IV SOLN
INTRAVENOUS | Status: DC | PRN
  Administered 2019-08-04: 25 ug/min via INTRAVENOUS

## 2019-08-04 MED ORDER — CEFAZOLIN SODIUM-DEXTROSE 2-4 GM/100ML-% IV SOLN
2.0000 g | Freq: Three times a day (TID) | INTRAVENOUS | Status: AC
  Administered 2019-08-05 (×2): 2 g via INTRAVENOUS
  Filled 2019-08-04 (×2): qty 100

## 2019-08-04 MED ORDER — VANCOMYCIN HCL 1000 MG IV SOLR
INTRAVENOUS | Status: AC
  Filled 2019-08-04: qty 2000

## 2019-08-04 MED ORDER — BUPIVACAINE-EPINEPHRINE 0.5% -1:200000 IJ SOLN
INTRAMUSCULAR | Status: AC
  Filled 2019-08-04: qty 1

## 2019-08-04 MED ORDER — FENTANYL CITRATE (PF) 250 MCG/5ML IJ SOLN
INTRAMUSCULAR | Status: AC
  Filled 2019-08-04: qty 5

## 2019-08-04 MED ORDER — VANCOMYCIN HCL POWD
Status: DC | PRN
  Administered 2019-08-04: 1000 mg via TOPICAL
  Administered 2019-08-04: 500 mg via TOPICAL

## 2019-08-04 MED ORDER — ONDANSETRON HCL 4 MG/2ML IJ SOLN
INTRAMUSCULAR | Status: DC | PRN
  Administered 2019-08-04: 4 mg via INTRAVENOUS

## 2019-08-04 MED ORDER — ROCURONIUM BROMIDE 10 MG/ML (PF) SYRINGE
PREFILLED_SYRINGE | INTRAVENOUS | Status: DC | PRN
  Administered 2019-08-04 (×3): 50 mg via INTRAVENOUS

## 2019-08-04 MED ORDER — ORAL CARE MOUTH RINSE
15.0000 mL | Freq: Two times a day (BID) | OROMUCOSAL | Status: DC
  Administered 2019-08-05 – 2019-09-01 (×53): 15 mL via OROMUCOSAL

## 2019-08-04 MED ORDER — CEFAZOLIN SODIUM-DEXTROSE 2-4 GM/100ML-% IV SOLN: 2.0000 g | Freq: Three times a day (TID) | INTRAVENOUS | Status: DC

## 2019-08-04 MED ORDER — PROPOFOL 10 MG/ML IV BOLUS
INTRAVENOUS | Status: AC
  Filled 2019-08-04: qty 20

## 2019-08-04 MED ORDER — ALBUMIN HUMAN 5 % IV SOLN
INTRAVENOUS | Status: DC | PRN
  Administered 2019-08-04 (×2): via INTRAVENOUS

## 2019-08-04 MED ORDER — MIDAZOLAM HCL 2 MG/2ML IJ SOLN
INTRAMUSCULAR | Status: AC
  Filled 2019-08-04: qty 2

## 2019-08-04 MED ORDER — DEXAMETHASONE SODIUM PHOSPHATE 10 MG/ML IJ SOLN
INTRAMUSCULAR | Status: DC | PRN
  Administered 2019-08-04: 10 mg via INTRAVENOUS

## 2019-08-04 MED ORDER — MORPHINE SULFATE (PF) 4 MG/ML IV SOLN
4.0000 mg | INTRAVENOUS | Status: DC | PRN
  Administered 2019-08-04 – 2019-08-05 (×7): 4 mg via INTRAVENOUS
  Filled 2019-08-04 (×8): qty 1

## 2019-08-04 MED ORDER — CEFAZOLIN SODIUM-DEXTROSE 2-4 GM/100ML-% IV SOLN
2.0000 g | Freq: Once | INTRAVENOUS | Status: AC
  Administered 2019-08-04: 2 g via INTRAVENOUS
  Filled 2019-08-04: qty 100

## 2019-08-04 MED ORDER — LACTATED RINGERS IV SOLN
INTRAVENOUS | Status: DC | PRN
  Administered 2019-08-04: 09:00:00 via INTRAVENOUS

## 2019-08-04 MED ORDER — CEFAZOLIN SODIUM 1 G IJ SOLR
INTRAMUSCULAR | Status: AC
  Filled 2019-08-04: qty 20

## 2019-08-04 MED ORDER — CEFAZOLIN SODIUM-DEXTROSE 2-3 GM-%(50ML) IV SOLR
INTRAVENOUS | Status: DC | PRN
  Administered 2019-08-04: 2 g via INTRAVENOUS

## 2019-08-04 MED FILL — Vancomycin HCl For IV Soln 1 GM (Base Equivalent): INTRAVENOUS | Qty: 1000 | Status: AC

## 2019-08-04 MED FILL — Vancomycin HCl For IV Soln 500 MG (Base Equivalent): INTRAVENOUS | Qty: 500 | Status: AC

## 2019-08-04 SURGICAL SUPPLY — 85 items
ADH SKN CLS APL DERMABOND .7 (GAUZE/BANDAGES/DRESSINGS) ×3
APL PRP STRL LF DISP 70% ISPRP (MISCELLANEOUS) ×3
BIT DRILL 2.2 SS TIBIAL (BIT) ×4 IMPLANT
BIT DRILL 2.5X2.75 QC CALB (BIT) ×4 IMPLANT
BIT DRILL 3.5X5.5 QC CALB (BIT) ×4 IMPLANT
BIT DRILL CLAV ALPS 2.7X145 (BIT) ×4 IMPLANT
BNDG COHESIVE 4X5 TAN STRL (GAUZE/BANDAGES/DRESSINGS) ×4 IMPLANT
BRUSH SCRUB EZ PLAIN DRY (MISCELLANEOUS) ×8 IMPLANT
CHLORAPREP W/TINT 26 (MISCELLANEOUS) ×4 IMPLANT
CLSR STERI-STRIP ANTIMIC 1/2X4 (GAUZE/BANDAGES/DRESSINGS) ×4 IMPLANT
COVER SURGICAL LIGHT HANDLE (MISCELLANEOUS) ×8 IMPLANT
DERMABOND ADVANCED (GAUZE/BANDAGES/DRESSINGS) ×1
DERMABOND ADVANCED .7 DNX12 (GAUZE/BANDAGES/DRESSINGS) ×3 IMPLANT
DRAPE C-ARM 42X72 X-RAY (DRAPES) ×4 IMPLANT
DRAPE EXTREMITY T 121X128X90 (DISPOSABLE) ×4 IMPLANT
DRAPE IMP U-DRAPE 54X76 (DRAPES) ×8 IMPLANT
DRAPE INCISE IOBAN 66X45 STRL (DRAPES) ×4 IMPLANT
DRAPE ORTHO SPLIT 77X108 STRL (DRAPES) ×8
DRAPE SURG ORHT 6 SPLT 77X108 (DRAPES) ×6 IMPLANT
DRAPE U-SHAPE 47X51 STRL (DRAPES) ×8 IMPLANT
DRSG MEPILEX BORDER 4X8 (GAUZE/BANDAGES/DRESSINGS) ×4 IMPLANT
ELECT PENCIL ROCKER SW 15FT (MISCELLANEOUS) ×4 IMPLANT
ELECT REM PT RETURN 9FT ADLT (ELECTROSURGICAL) ×4
ELECTRODE REM PT RTRN 9FT ADLT (ELECTROSURGICAL) ×3 IMPLANT
GLOVE BIO SURGEON STRL SZ 6.5 (GLOVE) ×12 IMPLANT
GLOVE BIO SURGEON STRL SZ7.5 (GLOVE) ×12 IMPLANT
GLOVE BIOGEL PI IND STRL 6.5 (GLOVE) ×6 IMPLANT
GLOVE BIOGEL PI IND STRL 7.5 (GLOVE) ×6 IMPLANT
GLOVE BIOGEL PI INDICATOR 6.5 (GLOVE) ×2
GLOVE BIOGEL PI INDICATOR 7.5 (GLOVE) ×2
GOWN STRL REUS W/ TWL LRG LVL3 (GOWN DISPOSABLE) ×6 IMPLANT
GOWN STRL REUS W/TWL LRG LVL3 (GOWN DISPOSABLE) ×8
K-WIRE 1.6 (WIRE) ×4
K-WIRE FX5X1.6XNS BN SS (WIRE) ×3
K-WIRE TROCHAR TIP ALPS 1.6 (WIRE) ×4
KIT BASIN OR (CUSTOM PROCEDURE TRAY) ×4 IMPLANT
KIT TURNOVER KIT B (KITS) ×4 IMPLANT
KWIRE FX5X1.6XNS BN SS (WIRE) ×3 IMPLANT
KWIRE TROCHAR TIP ALPS 1.6 (WIRE) ×3 IMPLANT
MANIFOLD NEPTUNE II (INSTRUMENTS) ×4 IMPLANT
NEEDLE HYPO 25GX1X1/2 BEV (NEEDLE) IMPLANT
NS IRRIG 1000ML POUR BTL (IV SOLUTION) ×4 IMPLANT
PACK GENERAL/GYN (CUSTOM PROCEDURE TRAY) ×4 IMPLANT
PAD ARMBOARD 7.5X6 YLW CONV (MISCELLANEOUS) ×8 IMPLANT
PLATE ACE 3.5MM 4HOLE (Plate) ×4 IMPLANT
PLATE CLAV TIS DIP 90 8H LT (Plate) ×4 IMPLANT
PLATE STANDARD DVR LEFT (Plate) ×4 IMPLANT
PLATE STD DVR LT 24X51 (Plate) ×3 IMPLANT
SCREW CORT LP 3.5X12 (Screw) ×4 IMPLANT
SCREW CORT LP 3.5X14 (Screw) ×16 IMPLANT
SCREW CORT LP T15 3.5X16 (Screw) ×12 IMPLANT
SCREW CORTICAL 3.5MM  44MM (Screw) ×1 IMPLANT
SCREW CORTICAL 3.5MM  46MM (Screw) ×1 IMPLANT
SCREW CORTICAL 3.5MM 38MM (Screw) ×4 IMPLANT
SCREW CORTICAL 3.5MM 44MM (Screw) ×3 IMPLANT
SCREW CORTICAL 3.5MM 46MM (Screw) ×3 IMPLANT
SCREW CORTICAL 3.5MM 48MM (Screw) ×4 IMPLANT
SCREW CORTICAL 3.5MM 70MM (Screw) ×4 IMPLANT
SCREW LOCK 16X2.7X 3 LD TPR (Screw) ×6 IMPLANT
SCREW LOCK 18X2.7X 3 LD TPR (Screw) ×3 IMPLANT
SCREW LOCK 20X2.7X 3 LD TPR (Screw) ×9 IMPLANT
SCREW LOCK 22X2.7X 3 LD TPR (Screw) ×6 IMPLANT
SCREW LOCK 24X2.7X3 LD THRD (Screw) ×6 IMPLANT
SCREW LOCKING 2.7X16 (Screw) ×8 IMPLANT
SCREW LOCKING 2.7X18 (Screw) ×4 IMPLANT
SCREW LOCKING 2.7X20MM (Screw) ×12 IMPLANT
SCREW LOCKING 2.7X22MM (Screw) ×8 IMPLANT
SCREW LOCKING 2.7X24MM (Screw) ×8 IMPLANT
SLING ARM IMMOBILIZER LRG (SOFTGOODS) IMPLANT
SLING ARM IMMOBILIZER MED (SOFTGOODS) IMPLANT
STAPLER VISISTAT 35W (STAPLE) ×4 IMPLANT
STOCKINETTE IMPERVIOUS 9X36 MD (GAUZE/BANDAGES/DRESSINGS) ×4 IMPLANT
SUCTION FRAZIER HANDLE 10FR (MISCELLANEOUS) ×1
SUCTION TUBE FRAZIER 10FR DISP (MISCELLANEOUS) ×3 IMPLANT
SUT MNCRL AB 3-0 PS2 18 (SUTURE) ×4 IMPLANT
SUT MNCRL AB 3-0 PS2 27 (SUTURE) ×8 IMPLANT
SUT VIC AB 0 CT1 27 (SUTURE) ×8
SUT VIC AB 0 CT1 27XBRD ANBCTR (SUTURE) ×6 IMPLANT
SUT VIC AB 2-0 CT1 (SUTURE) ×4 IMPLANT
SUT VIC AB 2-0 CT1 27 (SUTURE) ×12
SUT VIC AB 2-0 CT1 TAPERPNT 27 (SUTURE) ×9 IMPLANT
SYR CONTROL 10ML LL (SYRINGE) IMPLANT
TOWEL GREEN STERILE (TOWEL DISPOSABLE) ×4 IMPLANT
TUBE CONNECTING 12X1/4 (SUCTIONS) ×4 IMPLANT
WATER STERILE IRR 1000ML POUR (IV SOLUTION) ×4 IMPLANT

## 2019-08-04 NOTE — Progress Notes (Signed)
Orthopedic Tech Progress Note Patient Details:  [name] [facility] [birth year] [id number]  Ortho Devices Type of Ortho Device: CAM walker Ortho Device/Splint Location: LRE Ortho Device/Splint Interventions: Application   Post Interventions Patient Tolerated: Well Instructions Provided: Care of device   [name] 08/04/2019, 1:19 PM

## 2019-08-04 NOTE — Anesthesia Preprocedure Evaluation (Addendum)
Anesthesia Evaluation  Patient identified by MRN, date of birth, ID band Patient unresponsive    Reviewed: Allergy & Precautions, NPO status , Patient's Chart, lab work & pertinent test results, Unable to perform ROS - Chart review only  Airway Mallampati: Intubated       Dental   Pulmonary    Pulmonary exam normal        Cardiovascular hypertension, Pt. on medications Normal cardiovascular exam     Neuro/Psych    GI/Hepatic   Endo/Other    Renal/GU On CCRT     Musculoskeletal   Abdominal   Peds  Hematology   Anesthesia Other Findings   Reproductive/Obstetrics                            Anesthesia Physical Anesthesia Plan  ASA: III  Anesthesia Plan: General   Post-op Pain Management:    Induction: Intravenous  PONV Risk Score and Plan: 2 and Treatment may vary due to age or medical condition  Airway Management Planned: Oral ETT  Additional Equipment:   Intra-op Plan:   Post-operative Plan: Post-operative intubation/ventilation  Informed Consent: I have reviewed the patients History and Physical, chart, labs and discussed the procedure including the risks, benefits and alternatives for the proposed anesthesia with the patient or authorized representative who has indicated his/her understanding and acceptance.       Plan Discussed with: CRNA and Surgeon  Anesthesia Plan Comments:         Anesthesia Quick Evaluation

## 2019-08-04 NOTE — Progress Notes (Signed)
Dr. Bobbye Morton with Trauma called for an update about patient and asked how his BP was holding with only pulling off net 91mL/hr with CRRT. RN mentioned BP has been anywhere from [id number] systolic. New verbal order to pull off 61mL net per hour with CRRT machine. Order updated and will continue to monitor.
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
## 2019-08-04 NOTE — Procedures (Signed)
Extubation Procedure Note  Patient Details:   Name: [name] DOB: 03-03-1961 MRN: [id number]   Airway Documentation:    Vent end date: 08/04/19 Vent end time: 1614   Evaluation  O2 sats: stable throughout Complications: No apparent complications Patient did tolerate procedure well. Bilateral Breath Sounds: Rhonchi   Yes   Patient extubated with verbal order. MD at bedside. Tolerated well. 5LNC. ABle to speak and clear secretions.  Saunders Glance 08/04/2019, 4:17 PM

## 2019-08-04 NOTE — Progress Notes (Signed)
Ortho Trauma Note  Patient seen and examined this morning.  Currently stable to proceed to the operating room.  Still on CRRT for kidney function.  Discussed over the phone risks and benefits of surgical fixation with his wife Horris Latino.  Phone consent was obtained.  We will plan to proceed with ORIF of his left clavicle left wrist and the right ankle.  We will update nonweightbearing status postoperatively.  Shona Needles, MD [facility] [phone] (office) [email or web address]

## 2019-08-04 NOTE — Consult Note (Signed)
[facility] [place] Nurse wound consult note Reason for Consult: Midline wound NPWT dressing change with RUQ ileostomy pouch change. Wound type: Surgical Pressure Injury POA: N/A Measurement: 20cm (inclusive of umbilicus area) x 3cm x 3cm (at distal end) Wound OEC:XFQHK red, moist Drainage (amount, consistency, odor) serous at proximal end, serosanguinous at distal end Periwound: intact.  Macerated at umbilicus Dressing procedure/placement/frequency: Continue three times weekly NPWT dressing changes until wound bed granulates to skin level. Change ostomy pouching system simultaneously. One piece of black foam removed from wound and wound bed cleansed with NS.  Drape applied to umbilical area, left lateral wound edge and to left iliac crest linear area of tissue loss (full thickness) measuring 0.4cm x 6cm x 0.2cm.  [facility] Nurse ostomy follow up Stoma type/location: RUQ ileostomy Stomal assessment/size: 1 inch x 1 and 1/4 inch oval, minimally budded, red, moist, os at center Peristomal assessment: intact Treatment options for stomal/peristomal skin: none Output: small amount of light brown effluent in pouch Ostomy pouching: 2pc. Pouching system, 2 and 3/4 inch applied today. Note:  One piece convex pouch (CTF) will likely be the pouch of choice for this patient Kellie Simmering # [phone]).  There were none in the room and patient was going to OR in a short time, so 2 and 3/4 inch flat pouching system is applied today. Education provided: None.  Patient is on Fentanyl and is not able to converse. Wife is not in room. Transfer to OR for planned orthopedic procedure is imminent.  Convex pouches are ordered.  There is a spare NPWT dressing kit in the room for Friday's change.  [facility] nursing team will follow, and will remain available to this patient, the nursing and medical teams.   Thanks, Maudie Flakes, MSN, RN, [place], Arther Abbott  Pager# [phone] Enrolled patient in [place] Start Discharge program:  Yes/No
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
## 2019-08-04 NOTE — Transfer of Care (Signed)
Immediate Anesthesia Transfer of Care Note  Patient: [name] [facility]  Procedure(s) Performed: OPEN REDUCTION INTERNAL FIXATION (ORIF) CLAVICULAR FRACTURE (Left ) OPEN REDUCTION INTERNAL FIXATION (ORIF) WRIST FRACTURE (Left Wrist) Open Reduction Internal Fixation (Orif) Ankle Fracture (Right Ankle)  Patient Location: NICU  Anesthesia Type:General  Level of Consciousness: Patient remains intubated per anesthesia plan  Airway & Oxygen Therapy: Patient remains intubated per anesthesia plan  Post-op Assessment: Report given to RN and Post -op Vital signs reviewed and stable  Post vital signs: Reviewed and stable  Last Vitals:  Vitals Value Taken Time  BP 118/51 08/04/19 1235  Temp    Pulse 112 08/04/19 1242  Resp 15 08/04/19 1242  SpO2 93 % 08/04/19 1242  Vitals shown include unvalidated device data.  Last Pain:  Vitals:   08/04/19 0400  TempSrc: Axillary  PainSc:          Complications: No apparent anesthesia complications
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
## 2019-08-04 NOTE — Anesthesia Postprocedure Evaluation (Signed)
Anesthesia Post Note  Patient: Avneesh Salonga [facility]  Procedure(s) Performed: OPEN REDUCTION INTERNAL FIXATION (ORIF) CLAVICULAR FRACTURE (Left ) OPEN REDUCTION INTERNAL FIXATION (ORIF) WRIST FRACTURE (Left Wrist) Open Reduction Internal Fixation (Orif) Ankle Fracture (Right Ankle)     Patient location during evaluation: SICU Anesthesia Type: General Level of consciousness: sedated Pain management: pain level controlled Vital Signs Assessment: post-procedure vital signs reviewed and stable Respiratory status: patient remains intubated per anesthesia plan Cardiovascular status: stable Postop Assessment: no apparent nausea or vomiting Anesthetic complications: no    Last Vitals:  Vitals:   08/04/19 1400 08/04/19 1500  BP: 140/70 (!) 147/71  Pulse: (!) 108 (!) 103  Resp: 19 18  Temp:    SpO2: (!) 89% 98%    Last Pain:  Vitals:   08/04/19 0400  TempSrc: Axillary  PainSc:                  Jannat Rosemeyer DAVID

## 2019-08-05 ENCOUNTER — Encounter (HOSPITAL_COMMUNITY): Payer: Self-pay | Admitting: Student

## 2019-08-05 ENCOUNTER — Inpatient Hospital Stay (HOSPITAL_COMMUNITY): Payer: 59

## 2019-08-05 LAB — POCT I-STAT 7, (LYTES, BLD GAS, ICA,H+H)
Acid-Base Excess: 1 mmol/L (ref 0.0–2.0)
Acid-base deficit: 1 mmol/L (ref 0.0–2.0)
Bicarbonate: 23.9 mmol/L (ref 20.0–28.0)
Bicarbonate: 23.8 mmol/L (ref 20.0–28.0)
Calcium, Ion: 0.98 mmol/L — ABNORMAL LOW (ref 1.15–1.40)
Calcium, Ion: 1.02 mmol/L — ABNORMAL LOW (ref 1.15–1.40)
HCT: 28 % — ABNORMAL LOW (ref 39.0–52.0)
HCT: 28 % — ABNORMAL LOW (ref 39.0–52.0)
Hemoglobin: 9.5 g/dL — ABNORMAL LOW (ref 13.0–17.0)
Hemoglobin: 9.5 g/dL — ABNORMAL LOW (ref 13.0–17.0)
O2 Saturation: 91 %
O2 Saturation: 96 %
Patient temperature: 98
Potassium: 4.7 mmol/L (ref 3.5–5.1)
Potassium: 4.5 mmol/L (ref 3.5–5.1)
Sodium: 135 mmol/L (ref 135–145)
Sodium: 133 mmol/L — ABNORMAL LOW (ref 135–145)
TCO2: 25 mmol/L (ref 22–32)
TCO2: 25 mmol/L (ref 22–32)
pCO2 arterial: 38.9 mmHg (ref 32.0–48.0)
pCO2 arterial: 31.5 mmHg — ABNORMAL LOW (ref 32.0–48.0)
pH, Arterial: 7.397 (ref 7.350–7.450)
pH, Arterial: 7.485 — ABNORMAL HIGH (ref 7.350–7.450)
pO2, Arterial: 61 mmHg — ABNORMAL LOW (ref 83.0–108.0)
pO2, Arterial: 73 mmHg — ABNORMAL LOW (ref 83.0–108.0)

## 2019-08-05 LAB — GLUCOSE, CAPILLARY
Glucose-Capillary: 119 mg/dL — ABNORMAL HIGH (ref 70–99)
Glucose-Capillary: 103 mg/dL — ABNORMAL HIGH (ref 70–99)
Glucose-Capillary: 105 mg/dL — ABNORMAL HIGH (ref 70–99)
Glucose-Capillary: 124 mg/dL — ABNORMAL HIGH (ref 70–99)
Glucose-Capillary: 140 mg/dL — ABNORMAL HIGH (ref 70–99)
Glucose-Capillary: 140 mg/dL — ABNORMAL HIGH (ref 70–99)

## 2019-08-05 LAB — CBC
HCT: 29 % — ABNORMAL LOW (ref 39.0–52.0)
Hemoglobin: 8.9 g/dL — ABNORMAL LOW (ref 13.0–17.0)
MCH: 30.2 pg (ref 26.0–34.0)
MCHC: 30.7 g/dL (ref 30.0–36.0)
MCV: 98.3 fL (ref 80.0–100.0)
Platelets: 239 10*3/uL (ref 150–400)
RBC: 2.95 MIL/uL — ABNORMAL LOW (ref 4.22–5.81)
RDW: 17.3 % — ABNORMAL HIGH (ref 11.5–15.5)
WBC: 27.3 10*3/uL — ABNORMAL HIGH (ref 4.0–10.5)
nRBC: 0.2 % (ref 0.0–0.2)

## 2019-08-05 LAB — RENAL FUNCTION PANEL
Albumin: 2.2 g/dL — ABNORMAL LOW (ref 3.5–5.0)
Albumin: 2.1 g/dL — ABNORMAL LOW (ref 3.5–5.0)
Anion gap: 13 (ref 5–15)
Anion gap: 15 (ref 5–15)
BUN: 88 mg/dL — ABNORMAL HIGH (ref 6–20)
BUN: 95 mg/dL — ABNORMAL HIGH (ref 6–20)
CO2: 22 mmol/L (ref 22–32)
CO2: 20 mmol/L — ABNORMAL LOW (ref 22–32)
Calcium: 7.5 mg/dL — ABNORMAL LOW (ref 8.9–10.3)
Calcium: 7.8 mg/dL — ABNORMAL LOW (ref 8.9–10.3)
Chloride: 101 mmol/L (ref 98–111)
Chloride: 100 mmol/L (ref 98–111)
Creatinine, Ser: 3.33 mg/dL — ABNORMAL HIGH (ref 0.61–1.24)
Creatinine, Ser: 3.16 mg/dL — ABNORMAL HIGH (ref 0.61–1.24)
GFR calc Af Amer: 22 mL/min — ABNORMAL LOW (ref >60)
GFR calc Af Amer: 24 mL/min — ABNORMAL LOW (ref >60)
GFR calc non Af Amer: 19 mL/min — ABNORMAL LOW (ref >60)
GFR calc non Af Amer: 20 mL/min — ABNORMAL LOW (ref >60)
Glucose, Bld: 123 mg/dL — ABNORMAL HIGH (ref 70–99)
Glucose, Bld: 155 mg/dL — ABNORMAL HIGH (ref 70–99)
Phosphorus: 4.5 mg/dL (ref 2.5–4.6)
Phosphorus: 3.1 mg/dL (ref 2.5–4.6)
Potassium: 4.8 mmol/L (ref 3.5–5.1)
Potassium: 4.6 mmol/L (ref 3.5–5.1)
Sodium: 136 mmol/L (ref 135–145)
Sodium: 135 mmol/L (ref 135–145)

## 2019-08-05 LAB — CULTURE, RESPIRATORY W GRAM STAIN: Culture: NORMAL

## 2019-08-05 LAB — VITAMIN D 25 HYDROXY (VIT D DEFICIENCY, FRACTURES): Vit D, 25-Hydroxy: 21.71 ng/mL — ABNORMAL LOW (ref 30–100)

## 2019-08-05 LAB — MAGNESIUM: Magnesium: 3.1 mg/dL — ABNORMAL HIGH (ref 1.7–2.4)

## 2019-08-05 MED ORDER — PIVOT 1.5 CAL PO LIQD
1000.0000 mL | ORAL | Status: DC
  Administered 2019-08-06 – 2019-08-10 (×7): 1000 mL

## 2019-08-05 MED ORDER — HYDRALAZINE HCL 20 MG/ML IJ SOLN: 20.0000 mg | Freq: Four times a day (QID) | INTRAMUSCULAR | Status: DC | PRN

## 2019-08-05 MED ORDER — PRO-STAT SUGAR FREE PO LIQD
30.0000 mL | Freq: Three times a day (TID) | ORAL | Status: DC
  Administered 2019-08-05 – 2019-08-10 (×14): 30 mL
  Filled 2019-08-05 (×13): qty 30

## 2019-08-05 MED ORDER — HYDRALAZINE HCL 20 MG/ML IJ SOLN
20.0000 mg | Freq: Four times a day (QID) | INTRAMUSCULAR | Status: DC
  Administered 2019-08-05 – 2019-08-08 (×6): 20 mg via INTRAVENOUS
  Filled 2019-08-05 (×7): qty 1

## 2019-08-05 MED ORDER — CHLORHEXIDINE GLUCONATE CLOTH 2 % EX PADS
6.0000 | MEDICATED_PAD | Freq: Every day | CUTANEOUS | Status: DC
  Administered 2019-08-06 – 2019-09-01 (×25): 6 via TOPICAL
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
## 2019-08-05 NOTE — Progress Notes (Addendum)
Spoke with RN and Dr Georgette Dover re PIV to remove CVC.  Pt has temp HD cath RIJ with pigtail, R radial art line and LUE restricted d/t cast. Agree to no PIV and ok to use pigtail for Ancef .

## 2019-08-05 NOTE — Progress Notes (Signed)
Paged Trauma about patient's HR 130s and Respiratory rate in the 30s. Pain meds have been given and not due for lopressor for another hour. New order to get chest xray at this time and keep giving pain medicine as needed. No changes to q6h lopressor order, don't want to bring BP down too much. Will continue to monitor.
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
## 2019-08-05 NOTE — Progress Notes (Signed)
Rehab Admissions Coordinator Note:  Per PT recommendation, this patient was screened by Raechel Ache for appropriateness for an Inpatient Acute Rehab Consult.  Noted pt remains on CRRT and up to total assist +2 for bed mobility. At this time, we will follow for medical stability and progress with therapies prior to recommending consult order.   Raechel Ache 08/05/2019, 12:27 PM  I can be reached at [phone].
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
## 2019-08-06 ENCOUNTER — Encounter (HOSPITAL_COMMUNITY): Payer: Self-pay

## 2019-08-06 ENCOUNTER — Inpatient Hospital Stay (HOSPITAL_COMMUNITY): Payer: 59

## 2019-08-06 DIAGNOSIS — S42022A Displaced fracture of shaft of left clavicle, initial encounter for closed fracture: Secondary | ICD-10-CM

## 2019-08-06 DIAGNOSIS — I639 Cerebral infarction, unspecified: Secondary | ICD-10-CM

## 2019-08-06 DIAGNOSIS — S52502A Unspecified fracture of the lower end of left radius, initial encounter for closed fracture: Secondary | ICD-10-CM

## 2019-08-06 DIAGNOSIS — N179 Acute kidney failure, unspecified: Secondary | ICD-10-CM

## 2019-08-06 DIAGNOSIS — S82841A Displaced bimalleolar fracture of right lower leg, initial encounter for closed fracture: Secondary | ICD-10-CM

## 2019-08-06 DIAGNOSIS — S3590XA Unspecified injury of unspecified blood vessel at abdomen, lower back and pelvis level, initial encounter: Secondary | ICD-10-CM

## 2019-08-06 LAB — POCT I-STAT 7, (LYTES, BLD GAS, ICA,H+H)
Acid-base deficit: 2 mmol/L (ref 0.0–2.0)
Acid-base deficit: 4 mmol/L — ABNORMAL HIGH (ref 0.0–2.0)
Bicarbonate: 25.6 mmol/L (ref 20.0–28.0)
Bicarbonate: 21.9 mmol/L (ref 20.0–28.0)
Calcium, Ion: 1.09 mmol/L — ABNORMAL LOW (ref 1.15–1.40)
Calcium, Ion: 0.98 mmol/L — ABNORMAL LOW (ref 1.15–1.40)
HCT: 30 % — ABNORMAL LOW (ref 39.0–52.0)
HCT: 24 % — ABNORMAL LOW (ref 39.0–52.0)
Hemoglobin: 10.2 g/dL — ABNORMAL LOW (ref 13.0–17.0)
Hemoglobin: 8.2 g/dL — ABNORMAL LOW (ref 13.0–17.0)
O2 Saturation: 99 %
O2 Saturation: 98 %
Patient temperature: 38
Patient temperature: 37
Potassium: 4.8 mmol/L (ref 3.5–5.1)
Potassium: 4.5 mmol/L (ref 3.5–5.1)
Sodium: 136 mmol/L (ref 135–145)
Sodium: 136 mmol/L (ref 135–145)
TCO2: 27 mmol/L (ref 22–32)
TCO2: 23 mmol/L (ref 22–32)
pCO2 arterial: 61.1 mmHg — ABNORMAL HIGH (ref 32.0–48.0)
pCO2 arterial: 45.2 mmHg (ref 32.0–48.0)
pH, Arterial: 7.236 — ABNORMAL LOW (ref 7.350–7.450)
pH, Arterial: 7.294 — ABNORMAL LOW (ref 7.350–7.450)
pO2, Arterial: 167 mmHg — ABNORMAL HIGH (ref 83.0–108.0)
pO2, Arterial: 115 mmHg — ABNORMAL HIGH (ref 83.0–108.0)

## 2019-08-06 LAB — GLUCOSE, CAPILLARY
Glucose-Capillary: 142 mg/dL — ABNORMAL HIGH (ref 70–99)
Glucose-Capillary: 151 mg/dL — ABNORMAL HIGH (ref 70–99)
Glucose-Capillary: 133 mg/dL — ABNORMAL HIGH (ref 70–99)
Glucose-Capillary: 124 mg/dL — ABNORMAL HIGH (ref 70–99)
Glucose-Capillary: 143 mg/dL — ABNORMAL HIGH (ref 70–99)

## 2019-08-06 LAB — MAGNESIUM: Magnesium: 2.6 mg/dL — ABNORMAL HIGH (ref 1.7–2.4)

## 2019-08-06 LAB — CBC
HCT: 28.1 % — ABNORMAL LOW (ref 39.0–52.0)
Hemoglobin: 9 g/dL — ABNORMAL LOW (ref 13.0–17.0)
MCH: 29.9 pg (ref 26.0–34.0)
MCHC: 32 g/dL (ref 30.0–36.0)
MCV: 93.4 fL (ref 80.0–100.0)
Platelets: 374 10*3/uL (ref 150–400)
RBC: 3.01 MIL/uL — ABNORMAL LOW (ref 4.22–5.81)
RDW: 15.6 % — ABNORMAL HIGH (ref 11.5–15.5)
WBC: 29.7 10*3/uL — ABNORMAL HIGH (ref 4.0–10.5)
nRBC: 0.3 % — ABNORMAL HIGH (ref 0.0–0.2)

## 2019-08-06 LAB — RENAL FUNCTION PANEL
Albumin: 2 g/dL — ABNORMAL LOW (ref 3.5–5.0)
Albumin: 2 g/dL — ABNORMAL LOW (ref 3.5–5.0)
Anion gap: 13 (ref 5–15)
Anion gap: 14 (ref 5–15)
BUN: 92 mg/dL — ABNORMAL HIGH (ref 6–20)
BUN: 127 mg/dL — ABNORMAL HIGH (ref 6–20)
CO2: 23 mmol/L (ref 22–32)
CO2: 20 mmol/L — ABNORMAL LOW (ref 22–32)
Calcium: 7.9 mg/dL — ABNORMAL LOW (ref 8.9–10.3)
Calcium: 8.1 mg/dL — ABNORMAL LOW (ref 8.9–10.3)
Chloride: 104 mmol/L (ref 98–111)
Chloride: 103 mmol/L (ref 98–111)
Creatinine, Ser: 2.99 mg/dL — ABNORMAL HIGH (ref 0.61–1.24)
Creatinine, Ser: 3.92 mg/dL — ABNORMAL HIGH (ref 0.61–1.24)
GFR calc Af Amer: 25 mL/min — ABNORMAL LOW (ref >60)
GFR calc Af Amer: 18 mL/min — ABNORMAL LOW (ref >60)
GFR calc non Af Amer: 22 mL/min — ABNORMAL LOW (ref >60)
GFR calc non Af Amer: 16 mL/min — ABNORMAL LOW (ref >60)
Glucose, Bld: 141 mg/dL — ABNORMAL HIGH (ref 70–99)
Glucose, Bld: 152 mg/dL — ABNORMAL HIGH (ref 70–99)
Phosphorus: 3.3 mg/dL (ref 2.5–4.6)
Phosphorus: 3.6 mg/dL (ref 2.5–4.6)
Potassium: 4.7 mmol/L (ref 3.5–5.1)
Potassium: 5.2 mmol/L — ABNORMAL HIGH (ref 3.5–5.1)
Sodium: 140 mmol/L (ref 135–145)
Sodium: 137 mmol/L (ref 135–145)

## 2019-08-06 MED ORDER — ASPIRIN 325 MG PO TABS: 325.0000 mg | ORAL_TABLET | Freq: Every day | ORAL | Status: DC

## 2019-08-06 MED ORDER — ESMOLOL HCL-SODIUM CHLORIDE 2000 MG/100ML IV SOLN
25.0000 ug/kg/min | INTRAVENOUS | Status: DC
  Administered 2019-08-06 – 2019-08-09 (×3): 25 ug/kg/min via INTRAVENOUS
  Filled 2019-08-06 (×4): qty 100

## 2019-08-06 MED ORDER — MIDAZOLAM HCL 2 MG/2ML IJ SOLN
INTRAMUSCULAR | Status: AC
  Filled 2019-08-06: qty 2

## 2019-08-06 MED ORDER — ASPIRIN 325 MG PO TABS
325.0000 mg | ORAL_TABLET | Freq: Every day | ORAL | Status: DC
  Filled 2019-08-06: qty 1

## 2019-08-06 MED ORDER — STROKE: EARLY STAGES OF RECOVERY BOOK: Freq: Once | Status: DC

## 2019-08-06 MED ORDER — CALCIUM GLUCONATE-NACL 2-0.675 GM/100ML-% IV SOLN
2.0000 g | Freq: Once | INTRAVENOUS | Status: AC
  Administered 2019-08-06: 2000 mg via INTRAVENOUS
  Filled 2019-08-06: qty 100

## 2019-08-06 MED ORDER — HYDROMORPHONE HCL 1 MG/ML IJ SOLN
1.0000 mg | INTRAMUSCULAR | Status: DC | PRN
  Administered 2019-08-06 – 2019-08-09 (×8): 1 mg via INTRAVENOUS
  Filled 2019-08-06: qty 1
  Filled 2019-08-06: qty 2
  Filled 2019-08-06 (×2): qty 1
  Filled 2019-08-06: qty 2
  Filled 2019-08-06 (×3): qty 1

## 2019-08-06 MED ORDER — PIPERACILLIN-TAZOBACTAM 3.375 G IVPB
3.3750 g | Freq: Three times a day (TID) | INTRAVENOUS | Status: DC
  Administered 2019-08-07 (×2): 3.375 g via INTRAVENOUS
  Filled 2019-08-06 (×2): qty 50

## 2019-08-06 MED ORDER — ASPIRIN EC 325 MG PO TBEC: 325.0000 mg | DELAYED_RELEASE_TABLET | Freq: Every day | ORAL | Status: DC

## 2019-08-06 NOTE — Progress Notes (Addendum)
CT head with pontine infarct. Neurology consulted. Called wife to notify, but no answer, left VM.    Lengthy discussion held with the wife regarding new pontine infarct. Notified of additional imaging ordered by Neurology. Provided expectation of the patient requiring facility care at discharge. Explained trial off CRRT and possibilities if renal function does not recover.  Jesusita Oka, MD General and [facility]
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
## 2019-08-06 NOTE — Consult Note (Signed)
WOC Nurse wound consult note:  Patient is seen today with CCS MD A. Lovick  Reason for Consult: Midline wound NPWT dressing change with RUQ ileostomy pouch change. Wound type: Surgical Pressure Injury POA: N/A Measurement: From Wednesday: 20cm (inclusive of umbilicus area) x 3cm x 3cm (at distal end) Wound bed: Beefy red, moist Drainage (amount, consistency, odor) serous at proximal end, serosanguinous at distal end Periwound: intact.  Mild maceration remains at umbilicus Dressing procedure/placement/frequency: Continue three times weekly NPWT dressing changes until wound bed granulates to skin level. Change ostomy pouching system simultaneously. One piece of black foam removed from wound and wound bed cleansed with NS.  Drape applied to umbilical area and to left lateral wound edge. Three (3) pieces of black foam are used to obliterate dead space and to connect the proximal and distal portions of the wound (over drape).   WOC Nurse ostomy follow up:  Stoma type/location: RUQ ileostomy Stomal assessment/size: 1 inch x 1 and 1/4 inch oval, minimally budded, red, moist, os at center Peristomal assessment: intact Treatment options for stomal/peristomal skin: none Output: small amount of light brown effluent in pouch Ostomy pouching: 2pc. Pouching system, 2 and 3/4 inch applied today. Note:  One piece convex pouch (CTF) will likely be the pouch of choice for this patient ([facility] # [id number]).  Education provided: None.  Patient is is not able to converse other than to nod when asked if he wanted a cool cloth for his forehead. Wife is not in room. Transfer to CT is this morning, but not imminent. Enrolled patient in [employer] Discharge program: No  There is one remaining 2 and 3/4 inch pouching system with skin barrier ring in the room. There is a spare NPWT dressing kit in the room for Monday's dressing change.  WOC nursing team will follow, and will remain available to this patient,  the nursing and medical teams.   Thanks,  , MSN, RN, GNP, CWOCN, CWON-AP, FAAN  Pager# [phone]  
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
## 2019-08-06 NOTE — Progress Notes (Signed)
SLP Cancellation Note  Patient Details Name: [name] MRN: [id number] DOB: 01-Aug-1961   Cancelled treatment:       Reason Eval/Treat Not Completed: Fatigue/lethargy limiting ability to participate. Visited pt during PT session, pt sitting edge of bed with total assist. Alert but too weak to achieve labial seal with ice, initiate a swallow, cough or initiate any significant oral motor movement other than single words. Will defer full swallow eval until Monday as pt is profoundly weak, will continue efforts.   Herbie [place], MA CCC-SLP  Acute Rehabilitation Services Pager [phone] Office [phone]  Lynann Beaver 08/06/2019, 12:31 PM
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
## 2019-08-06 NOTE — Progress Notes (Signed)
Lower extremity venous has been completed.   Preliminary results in CV Proc.   [name] 08/06/2019 3:49 PM
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
## 2019-08-07 ENCOUNTER — Inpatient Hospital Stay (HOSPITAL_COMMUNITY): Payer: 59

## 2019-08-07 DIAGNOSIS — D72829 Elevated white blood cell count, unspecified: Secondary | ICD-10-CM

## 2019-08-07 DIAGNOSIS — I6330 Cerebral infarction due to thrombosis of unspecified cerebral artery: Secondary | ICD-10-CM | POA: Insufficient documentation

## 2019-08-07 DIAGNOSIS — S3590XA Unspecified injury of unspecified blood vessel at abdomen, lower back and pelvis level, initial encounter: Secondary | ICD-10-CM

## 2019-08-07 DIAGNOSIS — S42002A Fracture of unspecified part of left clavicle, initial encounter for closed fracture: Secondary | ICD-10-CM

## 2019-08-07 DIAGNOSIS — R000 Tachycardia, unspecified: Secondary | ICD-10-CM

## 2019-08-07 DIAGNOSIS — R509 Fever, unspecified: Secondary | ICD-10-CM

## 2019-08-07 DIAGNOSIS — N179 Acute kidney failure, unspecified: Secondary | ICD-10-CM

## 2019-08-07 DIAGNOSIS — I6389 Other cerebral infarction: Secondary | ICD-10-CM

## 2019-08-07 DIAGNOSIS — R1312 Dysphagia, oropharyngeal phase: Secondary | ICD-10-CM

## 2019-08-07 LAB — CBC
HCT: 25.9 % — ABNORMAL LOW (ref 39.0–52.0)
Hemoglobin: 8.3 g/dL — ABNORMAL LOW (ref 13.0–17.0)
MCH: 29.7 pg (ref 26.0–34.0)
MCHC: 32 g/dL (ref 30.0–36.0)
MCV: 92.8 fL (ref 80.0–100.0)
Platelets: 503 10*3/uL — ABNORMAL HIGH (ref 150–400)
RBC: 2.79 MIL/uL — ABNORMAL LOW (ref 4.22–5.81)
RDW: 15.7 % — ABNORMAL HIGH (ref 11.5–15.5)
WBC: 31.7 10*3/uL — ABNORMAL HIGH (ref 4.0–10.5)
nRBC: 0.1 % (ref 0.0–0.2)

## 2019-08-07 LAB — RENAL FUNCTION PANEL
Albumin: 1.9 g/dL — ABNORMAL LOW (ref 3.5–5.0)
Albumin: 2 g/dL — ABNORMAL LOW (ref 3.5–5.0)
Anion gap: 17 — ABNORMAL HIGH (ref 5–15)
Anion gap: 15 (ref 5–15)
BUN: 175 mg/dL — ABNORMAL HIGH (ref 6–20)
BUN: 169 mg/dL — ABNORMAL HIGH (ref 6–20)
CO2: 19 mmol/L — ABNORMAL LOW (ref 22–32)
CO2: 19 mmol/L — ABNORMAL LOW (ref 22–32)
Calcium: 8.1 mg/dL — ABNORMAL LOW (ref 8.9–10.3)
Calcium: 8.2 mg/dL — ABNORMAL LOW (ref 8.9–10.3)
Chloride: 103 mmol/L (ref 98–111)
Chloride: 106 mmol/L (ref 98–111)
Creatinine, Ser: 5.06 mg/dL — ABNORMAL HIGH (ref 0.61–1.24)
Creatinine, Ser: 4.48 mg/dL — ABNORMAL HIGH (ref 0.61–1.24)
GFR calc Af Amer: 13 mL/min — ABNORMAL LOW (ref >60)
GFR calc Af Amer: 16 mL/min — ABNORMAL LOW (ref >60)
GFR calc non Af Amer: 12 mL/min — ABNORMAL LOW (ref >60)
GFR calc non Af Amer: 13 mL/min — ABNORMAL LOW (ref >60)
Glucose, Bld: 134 mg/dL — ABNORMAL HIGH (ref 70–99)
Glucose, Bld: 146 mg/dL — ABNORMAL HIGH (ref 70–99)
Phosphorus: 4.9 mg/dL — ABNORMAL HIGH (ref 2.5–4.6)
Phosphorus: 4.1 mg/dL (ref 2.5–4.6)
Potassium: 5.4 mmol/L — ABNORMAL HIGH (ref 3.5–5.1)
Potassium: 5.4 mmol/L — ABNORMAL HIGH (ref 3.5–5.1)
Sodium: 139 mmol/L (ref 135–145)
Sodium: 140 mmol/L (ref 135–145)

## 2019-08-07 LAB — GLUCOSE, CAPILLARY
Glucose-Capillary: 120 mg/dL — ABNORMAL HIGH (ref 70–99)
Glucose-Capillary: 127 mg/dL — ABNORMAL HIGH (ref 70–99)
Glucose-Capillary: 131 mg/dL — ABNORMAL HIGH (ref 70–99)
Glucose-Capillary: 137 mg/dL — ABNORMAL HIGH (ref 70–99)
Glucose-Capillary: 147 mg/dL — ABNORMAL HIGH (ref 70–99)
Glucose-Capillary: 152 mg/dL — ABNORMAL HIGH (ref 70–99)

## 2019-08-07 LAB — LIPID PANEL
Cholesterol: 104 mg/dL (ref 0–200)
HDL: 17 mg/dL — ABNORMAL LOW (ref >40)
LDL Cholesterol: 49 mg/dL (ref 0–99)
Total CHOL/HDL Ratio: 6.1 RATIO
Triglycerides: 188 mg/dL — ABNORMAL HIGH (ref <150)
VLDL: 38 mg/dL (ref 0–40)

## 2019-08-07 LAB — MAGNESIUM: Magnesium: 2.9 mg/dL — ABNORMAL HIGH (ref 1.7–2.4)

## 2019-08-07 LAB — ECHOCARDIOGRAM COMPLETE
Height: 70 in
Weight: 2903.02 [oz_av]

## 2019-08-07 LAB — PHOSPHORUS: Phosphorus: 4.9 mg/dL — ABNORMAL HIGH (ref 2.5–4.6)

## 2019-08-07 LAB — HEMOGLOBIN A1C
Hgb A1c MFr Bld: 6.1 % — ABNORMAL HIGH (ref 4.8–5.6)
Mean Plasma Glucose: 128.37 mg/dL

## 2019-08-07 MED ORDER — PRISMASOL BGK 4/2.5 32-4-2.5 MEQ/L REPLACEMENT SOLN
Status: DC
  Administered 2019-08-07 – 2019-08-09 (×5): via INTRAVENOUS_CENTRAL
  Filled 2019-08-07 (×9): qty 5000

## 2019-08-07 MED ORDER — ASPIRIN 81 MG PO CHEW
324.0000 mg | CHEWABLE_TABLET | Freq: Every day | ORAL | Status: DC
  Administered 2019-08-07 – 2019-08-17 (×11): 324 mg
  Filled 2019-08-07 (×12): qty 4

## 2019-08-07 MED ORDER — SODIUM CHLORIDE 0.9 % FOR CRRT
INTRAVENOUS_CENTRAL | Status: DC | PRN
  Administered 2019-08-07: 999 mL via INTRAVENOUS_CENTRAL
  Filled 2019-08-07 (×2): qty 1000

## 2019-08-07 MED ORDER — PRISMASOL BGK 4/2.5 32-4-2.5 MEQ/L REPLACEMENT SOLN
Status: DC
  Administered 2019-08-07 – 2019-08-09 (×5): via INTRAVENOUS_CENTRAL
  Filled 2019-08-07 (×9): qty 5000

## 2019-08-07 MED ORDER — PIPERACILLIN-TAZOBACTAM 3.375 G IVPB 30 MIN
3.3750 g | Freq: Four times a day (QID) | INTRAVENOUS | Status: DC
  Administered 2019-08-07 – 2019-08-09 (×7): 3.375 g via INTRAVENOUS
  Filled 2019-08-07 (×16): qty 50

## 2019-08-07 MED ORDER — HEPARIN SODIUM (PORCINE) 1000 UNIT/ML DIALYSIS: 1000.0000 [IU] | INTRAMUSCULAR | Status: DC | PRN

## 2019-08-07 MED ORDER — PRISMASOL BGK 4/2.5 32-4-2.5 MEQ/L IV SOLN
INTRAVENOUS | Status: DC
  Administered 2019-08-07 – 2019-08-10 (×21): via INTRAVENOUS_CENTRAL
  Filled 2019-08-07 (×40): qty 5000
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
## 2019-08-07 NOTE — Progress Notes (Signed)
  Echocardiogram 2D Echocardiogram has been performed.  [name] F 08/07/2019, 5:05 PM
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
## 2019-08-07 NOTE — Plan of Care (Signed)
  Problem: Elimination: Goal: Will not experience complications related to urinary retention Outcome: Not Progressing

## 2019-08-08 DIAGNOSIS — S82841A Displaced bimalleolar fracture of right lower leg, initial encounter for closed fracture: Secondary | ICD-10-CM

## 2019-08-08 LAB — RENAL FUNCTION PANEL
Albumin: 2 g/dL — ABNORMAL LOW (ref 3.5–5.0)
Albumin: 2 g/dL — ABNORMAL LOW (ref 3.5–5.0)
Anion gap: 10 (ref 5–15)
Anion gap: 12 (ref 5–15)
BUN: 94 mg/dL — ABNORMAL HIGH (ref 6–20)
BUN: 83 mg/dL — ABNORMAL HIGH (ref 6–20)
CO2: 23 mmol/L (ref 22–32)
CO2: 22 mmol/L (ref 22–32)
Calcium: 8.2 mg/dL — ABNORMAL LOW (ref 8.9–10.3)
Calcium: 8.3 mg/dL — ABNORMAL LOW (ref 8.9–10.3)
Chloride: 106 mmol/L (ref 98–111)
Chloride: 105 mmol/L (ref 98–111)
Creatinine, Ser: 2.43 mg/dL — ABNORMAL HIGH (ref 0.61–1.24)
Creatinine, Ser: 2.57 mg/dL — ABNORMAL HIGH (ref 0.61–1.24)
GFR calc Af Amer: 32 mL/min — ABNORMAL LOW (ref >60)
GFR calc Af Amer: 30 mL/min — ABNORMAL LOW (ref >60)
GFR calc non Af Amer: 28 mL/min — ABNORMAL LOW (ref >60)
GFR calc non Af Amer: 26 mL/min — ABNORMAL LOW (ref >60)
Glucose, Bld: 128 mg/dL — ABNORMAL HIGH (ref 70–99)
Glucose, Bld: 141 mg/dL — ABNORMAL HIGH (ref 70–99)
Phosphorus: 4.4 mg/dL (ref 2.5–4.6)
Phosphorus: 4.4 mg/dL (ref 2.5–4.6)
Potassium: 5.1 mmol/L (ref 3.5–5.1)
Potassium: 5.4 mmol/L — ABNORMAL HIGH (ref 3.5–5.1)
Sodium: 139 mmol/L (ref 135–145)
Sodium: 139 mmol/L (ref 135–145)

## 2019-08-08 LAB — CBC
HCT: 26.5 % — ABNORMAL LOW (ref 39.0–52.0)
Hemoglobin: 8.4 g/dL — ABNORMAL LOW (ref 13.0–17.0)
MCH: 29.8 pg (ref 26.0–34.0)
MCHC: 31.7 g/dL (ref 30.0–36.0)
MCV: 94 fL (ref 80.0–100.0)
Platelets: 639 10*3/uL — ABNORMAL HIGH (ref 150–400)
RBC: 2.82 MIL/uL — ABNORMAL LOW (ref 4.22–5.81)
RDW: 15.7 % — ABNORMAL HIGH (ref 11.5–15.5)
WBC: 33.1 10*3/uL — ABNORMAL HIGH (ref 4.0–10.5)
nRBC: 0.1 % (ref 0.0–0.2)

## 2019-08-08 LAB — GLUCOSE, CAPILLARY
Glucose-Capillary: 115 mg/dL — ABNORMAL HIGH (ref 70–99)
Glucose-Capillary: 123 mg/dL — ABNORMAL HIGH (ref 70–99)
Glucose-Capillary: 128 mg/dL — ABNORMAL HIGH (ref 70–99)
Glucose-Capillary: 129 mg/dL — ABNORMAL HIGH (ref 70–99)
Glucose-Capillary: 129 mg/dL — ABNORMAL HIGH (ref 70–99)
Glucose-Capillary: 130 mg/dL — ABNORMAL HIGH (ref 70–99)

## 2019-08-08 LAB — CULTURE, BLOOD (ROUTINE X 2)
Culture: NO GROWTH
Culture: NO GROWTH
Special Requests: ADEQUATE

## 2019-08-08 LAB — MAGNESIUM: Magnesium: 2.4 mg/dL (ref 1.7–2.4)

## 2019-08-08 MED ORDER — METOPROLOL TARTRATE 25 MG/10 ML ORAL SUSPENSION
25.0000 mg | Freq: Two times a day (BID) | ORAL | Status: DC
  Administered 2019-08-08 – 2019-08-09 (×3): 25 mg
  Filled 2019-08-08 (×3): qty 10
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
## 2019-08-09 ENCOUNTER — Inpatient Hospital Stay (HOSPITAL_COMMUNITY): Payer: 59

## 2019-08-09 LAB — POCT I-STAT 7, (LYTES, BLD GAS, ICA,H+H)
Acid-Base Excess: 2 mmol/L (ref 0.0–2.0)
Bicarbonate: 26.9 mmol/L (ref 20.0–28.0)
Calcium, Ion: 1.2 mmol/L (ref 1.15–1.40)
HCT: 29 % — ABNORMAL LOW (ref 39.0–52.0)
Hemoglobin: 9.9 g/dL — ABNORMAL LOW (ref 13.0–17.0)
O2 Saturation: 49 %
Patient temperature: 99.7
Potassium: 4.7 mmol/L (ref 3.5–5.1)
Sodium: 141 mmol/L (ref 135–145)
TCO2: 28 mmol/L (ref 22–32)
pCO2 arterial: 45.1 mmHg (ref 32.0–48.0)
pH, Arterial: 7.387 (ref 7.350–7.450)
pO2, Arterial: 28 mmHg — CL (ref 83.0–108.0)

## 2019-08-09 LAB — RENAL FUNCTION PANEL
Albumin: 2 g/dL — ABNORMAL LOW (ref 3.5–5.0)
Albumin: 2 g/dL — ABNORMAL LOW (ref 3.5–5.0)
Anion gap: 8 (ref 5–15)
Anion gap: 11 (ref 5–15)
BUN: 67 mg/dL — ABNORMAL HIGH (ref 6–20)
BUN: 61 mg/dL — ABNORMAL HIGH (ref 6–20)
CO2: 25 mmol/L (ref 22–32)
CO2: 25 mmol/L (ref 22–32)
Calcium: 8.2 mg/dL — ABNORMAL LOW (ref 8.9–10.3)
Calcium: 8.2 mg/dL — ABNORMAL LOW (ref 8.9–10.3)
Chloride: 106 mmol/L (ref 98–111)
Chloride: 102 mmol/L (ref 98–111)
Creatinine, Ser: 2.29 mg/dL — ABNORMAL HIGH (ref 0.61–1.24)
Creatinine, Ser: 2.35 mg/dL — ABNORMAL HIGH (ref 0.61–1.24)
GFR calc Af Amer: 35 mL/min — ABNORMAL LOW (ref >60)
GFR calc Af Amer: 34 mL/min — ABNORMAL LOW (ref >60)
GFR calc non Af Amer: 30 mL/min — ABNORMAL LOW (ref >60)
GFR calc non Af Amer: 29 mL/min — ABNORMAL LOW (ref >60)
Glucose, Bld: 121 mg/dL — ABNORMAL HIGH (ref 70–99)
Glucose, Bld: 121 mg/dL — ABNORMAL HIGH (ref 70–99)
Phosphorus: 4.2 mg/dL (ref 2.5–4.6)
Phosphorus: 4.4 mg/dL (ref 2.5–4.6)
Potassium: 5.2 mmol/L — ABNORMAL HIGH (ref 3.5–5.1)
Potassium: 5.3 mmol/L — ABNORMAL HIGH (ref 3.5–5.1)
Sodium: 139 mmol/L (ref 135–145)
Sodium: 138 mmol/L (ref 135–145)

## 2019-08-09 LAB — GLUCOSE, CAPILLARY
Glucose-Capillary: 120 mg/dL — ABNORMAL HIGH (ref 70–99)
Glucose-Capillary: 116 mg/dL — ABNORMAL HIGH (ref 70–99)
Glucose-Capillary: 160 mg/dL — ABNORMAL HIGH (ref 70–99)
Glucose-Capillary: 107 mg/dL — ABNORMAL HIGH (ref 70–99)
Glucose-Capillary: 119 mg/dL — ABNORMAL HIGH (ref 70–99)
Glucose-Capillary: 112 mg/dL — ABNORMAL HIGH (ref 70–99)
Glucose-Capillary: 104 mg/dL — ABNORMAL HIGH (ref 70–99)

## 2019-08-09 LAB — POCT ACTIVATED CLOTTING TIME
Activated Clotting Time: 136 s
Activated Clotting Time: 131 s
Activated Clotting Time: 142 s
Activated Clotting Time: 153 s
Activated Clotting Time: 153 s

## 2019-08-09 LAB — MAGNESIUM: Magnesium: 2.4 mg/dL (ref 1.7–2.4)

## 2019-08-09 MED ORDER — RESOURCE THICKENUP CLEAR PO POWD: ORAL | Status: DC | PRN

## 2019-08-09 MED ORDER — CALCIUM GLUCONATE-NACL 1-0.675 GM/50ML-% IV SOLN
1.0000 g | Freq: Once | INTRAVENOUS | Status: AC
  Administered 2019-08-09: 1000 mg via INTRAVENOUS
  Filled 2019-08-09: qty 50

## 2019-08-09 MED ORDER — TRAZODONE HCL 50 MG PO TABS
50.0000 mg | ORAL_TABLET | Freq: Every evening | ORAL | Status: DC | PRN
  Administered 2019-08-09 – 2019-08-24 (×12): 50 mg via ORAL
  Filled 2019-08-09 (×12): qty 1

## 2019-08-09 MED ORDER — PROPRANOLOL HCL 10 MG PO TABS
10.0000 mg | ORAL_TABLET | Freq: Three times a day (TID) | ORAL | Status: DC
  Administered 2019-08-09 (×2): 10 mg via ORAL
  Filled 2019-08-09 (×5): qty 1

## 2019-08-09 MED ORDER — RESOURCE THICKENUP CLEAR PO POWD
ORAL | Status: DC | PRN
  Administered 2019-08-09: 17:00:00 via ORAL
  Filled 2019-08-09: qty 125

## 2019-08-09 MED ORDER — HYDROMORPHONE HCL 1 MG/ML IJ SOLN
0.5000 mg | Freq: Four times a day (QID) | INTRAMUSCULAR | Status: DC | PRN
  Administered 2019-08-10 – 2019-08-17 (×11): 0.5 mg via INTRAVENOUS
  Filled 2019-08-09 (×13): qty 1

## 2019-08-09 MED ORDER — HEPARIN BOLUS VIA INFUSION (CRRT)
1000.0000 [IU] | INTRAVENOUS | Status: DC | PRN
  Administered 2019-08-09 (×3): 1000 [IU] via INTRAVENOUS_CENTRAL
  Filled 2019-08-09: qty 1000

## 2019-08-09 MED ORDER — CALCIUM POLYCARBOPHIL 625 MG PO TABS
625.0000 mg | ORAL_TABLET | Freq: Every day | ORAL | Status: DC
  Administered 2019-08-09 – 2019-08-11 (×3): 625 mg
  Filled 2019-08-09 (×4): qty 1

## 2019-08-09 MED ORDER — SODIUM CHLORIDE 0.9 % IV SOLN
250.0000 [IU]/h | INTRAVENOUS | Status: DC
  Administered 2019-08-09: 1450 [IU]/h via INTRAVENOUS_CENTRAL
  Administered 2019-08-09: 1350 [IU]/h via INTRAVENOUS_CENTRAL
  Administered 2019-08-09: 250 [IU]/h via INTRAVENOUS_CENTRAL
  Administered 2019-08-09: 1450 [IU]/h via INTRAVENOUS_CENTRAL
  Administered 2019-08-09: 1550 [IU]/h via INTRAVENOUS_CENTRAL
  Administered 2019-08-10: 2150 [IU]/h via INTRAVENOUS_CENTRAL
  Administered 2019-08-10: 1950 [IU]/h via INTRAVENOUS_CENTRAL
  Administered 2019-08-10: 2050 [IU]/h via INTRAVENOUS_CENTRAL
  Administered 2019-08-10: 1750 [IU]/h via INTRAVENOUS_CENTRAL
  Administered 2019-08-10: 1650 [IU]/h via INTRAVENOUS_CENTRAL
  Administered 2019-08-10: 04:00:00 1950 [IU]/h via INTRAVENOUS_CENTRAL
  Administered 2019-08-10: 2350 [IU]/h via INTRAVENOUS_CENTRAL
  Administered 2019-08-10: 1850 [IU]/h via INTRAVENOUS_CENTRAL
  Filled 2019-08-09 (×4): qty 2
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
## 2019-08-09 NOTE — Consult Note (Addendum)
Wise Nurse ostomy follow up Stoma type/location: RUQ, end ileostomy Stomal assessment/size: 1in, minimally budded, os located centrally  Peristomal assessment: intact  Treatment options for stomal/peristomal skin: using 2" barrier ring  Output  Ostomy pouching:2pc. 2 3/4" high output pouch placed to BSD bag due to high volumes.  In room on 2 occasions today and the pouch was completely full Th[place] being added Education provided: Patient is sleepy but can answer questions. Explained creation and rationale for stoma. No family in the room. Patient still [place] and is minimally conversant with [place].  Enrolled patient in [place] Start Discharge program: No  NPWT dressing DCed from midline with pouch change.  Bedside nurse placed moist to moist dressing.   [place] Nurse will follow along with you for continued support with ostomy teaching and care [place] MSN, RN, [place], [place], [place]
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
## 2019-08-09 NOTE — Progress Notes (Signed)
Modified Barium Swallow Progress Note  Patient Details  Name: [name] MRN: [id number] Date of Birth: 20-May-1961  Today's Date: 08/09/2019  Modified Barium Swallow completed.  Full report located under Chart Review in the Imaging Section.  Brief recommendations include the following:  Clinical Impression  Pt was motivated and cooperative during study but impacted by pain and generalized deconditioning with head tilted forward and down at rest. Weak volitional cough able to be elicited. Pt observed with honey thick (cup, spoon) and puree (spoon) with total assist. Pt demonstrated reduced labial seal resulting in significant anterior spillage with honey thick liquids and reduced lingual manipulation and delayed posterior propulsion on all consistencies. Bolus pooled anteriorly due to reduced neck flexion and required max cues to lift head or move bolus posteriorly. Honey thick via cup had 1x instance of moderate aspiration during second swallow with delayed cough following and 1x instance of penetration above VF after swallow from vallecular residue (not ejected). Puree resulted in coordinated, timely swallow with no instances of penetration and mild residue in valleculae and pyriform sinuses. 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 Recommend initiating diet of puree and pudding thick liquids (via spoon) with continuation of tube feedings to ensure adequate nutrition and caloric intake due to pts reduced endurance.    Swallow Evaluation Recommendations       SLP Diet Recommendations: Dysphagia 1 (Puree) solids;Pudding thick liquid;Alternative means - temporary   Liquid Administration via: Spoon   Medication Administration: Via alternative means   Supervision: Full supervision/cueing for compensatory strategies   Compensations: Slow rate;Small sips/bites;Monitor for anterior loss   Postural Changes: Seated  upright at 90 degrees   Oral Care Recommendations: Oral care QID;Staff/trained caregiver to provide oral care   Other Recommendations: Order thickener from pharmacy    [name] 08/09/2019,3:10 PM   Orbie [email or web address] [email or web address] Risk analyst [phone] Office [phone]

## 2019-08-10 LAB — RENAL FUNCTION PANEL
Albumin: 2 g/dL — ABNORMAL LOW (ref 3.5–5.0)
Albumin: 2 g/dL — ABNORMAL LOW (ref 3.5–5.0)
Anion gap: 12 (ref 5–15)
Anion gap: 12 (ref 5–15)
BUN: 57 mg/dL — ABNORMAL HIGH (ref 6–20)
BUN: 78 mg/dL — ABNORMAL HIGH (ref 6–20)
CO2: 25 mmol/L (ref 22–32)
CO2: 22 mmol/L (ref 22–32)
Calcium: 8.2 mg/dL — ABNORMAL LOW (ref 8.9–10.3)
Calcium: 8.1 mg/dL — ABNORMAL LOW (ref 8.9–10.3)
Chloride: 102 mmol/L (ref 98–111)
Chloride: 106 mmol/L (ref 98–111)
Creatinine, Ser: 2.1 mg/dL — ABNORMAL HIGH (ref 0.61–1.24)
Creatinine, Ser: 2.69 mg/dL — ABNORMAL HIGH (ref 0.61–1.24)
GFR calc Af Amer: 39 mL/min — ABNORMAL LOW (ref >60)
GFR calc Af Amer: 29 mL/min — ABNORMAL LOW (ref >60)
GFR calc non Af Amer: 33 mL/min — ABNORMAL LOW (ref >60)
GFR calc non Af Amer: 25 mL/min — ABNORMAL LOW (ref >60)
Glucose, Bld: 123 mg/dL — ABNORMAL HIGH (ref 70–99)
Glucose, Bld: 136 mg/dL — ABNORMAL HIGH (ref 70–99)
Phosphorus: 4 mg/dL (ref 2.5–4.6)
Phosphorus: 4.8 mg/dL — ABNORMAL HIGH (ref 2.5–4.6)
Potassium: 4.9 mmol/L (ref 3.5–5.1)
Potassium: 4.9 mmol/L (ref 3.5–5.1)
Sodium: 139 mmol/L (ref 135–145)
Sodium: 140 mmol/L (ref 135–145)

## 2019-08-10 LAB — CBC
HCT: 24.6 % — ABNORMAL LOW (ref 39.0–52.0)
Hemoglobin: 7.6 g/dL — ABNORMAL LOW (ref 13.0–17.0)
MCH: 29.9 pg (ref 26.0–34.0)
MCHC: 30.9 g/dL (ref 30.0–36.0)
MCV: 96.9 fL (ref 80.0–100.0)
Platelets: 862 10*3/uL — ABNORMAL HIGH (ref 150–400)
RBC: 2.54 MIL/uL — ABNORMAL LOW (ref 4.22–5.81)
RDW: 16.1 % — ABNORMAL HIGH (ref 11.5–15.5)
WBC: 30.5 10*3/uL — ABNORMAL HIGH (ref 4.0–10.5)
nRBC: 0 % (ref 0.0–0.2)

## 2019-08-10 LAB — POCT ACTIVATED CLOTTING TIME
Activated Clotting Time: 147 s
Activated Clotting Time: 153 s
Activated Clotting Time: 153 s
Activated Clotting Time: 158 s
Activated Clotting Time: 158 s
Activated Clotting Time: 158 s
Activated Clotting Time: 158 s
Activated Clotting Time: 158 s
Activated Clotting Time: 158 s
Activated Clotting Time: 164 s
Activated Clotting Time: 164 s
Activated Clotting Time: 175 s
Activated Clotting Time: 175 s
Activated Clotting Time: 175 s
Activated Clotting Time: 175 s

## 2019-08-10 LAB — POCT I-STAT 7, (LYTES, BLD GAS, ICA,H+H)
Acid-base deficit: 3 mmol/L — ABNORMAL HIGH (ref 0.0–2.0)
Bicarbonate: 20.1 mmol/L (ref 20.0–28.0)
Calcium, Ion: 1.14 mmol/L — ABNORMAL LOW (ref 1.15–1.40)
HCT: 21 % — ABNORMAL LOW (ref 39.0–52.0)
Hemoglobin: 7.1 g/dL — ABNORMAL LOW (ref 13.0–17.0)
O2 Saturation: 96 %
Patient temperature: 102.4
Potassium: 4.8 mmol/L (ref 3.5–5.1)
Sodium: 138 mmol/L (ref 135–145)
TCO2: 21 mmol/L — ABNORMAL LOW (ref 22–32)
pCO2 arterial: 27.8 mmHg — ABNORMAL LOW (ref 32.0–48.0)
pH, Arterial: 7.473 — ABNORMAL HIGH (ref 7.350–7.450)
pO2, Arterial: 82 mmHg — ABNORMAL LOW (ref 83.0–108.0)

## 2019-08-10 LAB — GLUCOSE, CAPILLARY
Glucose-Capillary: 121 mg/dL — ABNORMAL HIGH (ref 70–99)
Glucose-Capillary: 117 mg/dL — ABNORMAL HIGH (ref 70–99)
Glucose-Capillary: 125 mg/dL — ABNORMAL HIGH (ref 70–99)
Glucose-Capillary: 149 mg/dL — ABNORMAL HIGH (ref 70–99)
Glucose-Capillary: 130 mg/dL — ABNORMAL HIGH (ref 70–99)
Glucose-Capillary: 144 mg/dL — ABNORMAL HIGH (ref 70–99)

## 2019-08-10 LAB — MAGNESIUM: Magnesium: 2.4 mg/dL (ref 1.7–2.4)

## 2019-08-10 LAB — APTT: aPTT: 64 seconds — ABNORMAL HIGH (ref 24–36)

## 2019-08-10 MED ORDER — PROPRANOLOL HCL 20 MG PO TABS
20.0000 mg | ORAL_TABLET | Freq: Three times a day (TID) | ORAL | Status: DC
  Administered 2019-08-10 – 2019-08-11 (×3): 20 mg via ORAL
  Filled 2019-08-10 (×7): qty 1

## 2019-08-10 MED ORDER — LOPERAMIDE HCL 2 MG PO CAPS
2.0000 mg | ORAL_CAPSULE | Freq: Two times a day (BID) | ORAL | Status: DC
  Filled 2019-08-10: qty 1

## 2019-08-10 MED ORDER — NOREPINEPHRINE 4 MG/250ML-% IV SOLN
0.0000 ug/min | INTRAVENOUS | Status: DC
  Administered 2019-08-10: 16:00:00 2 ug/min via INTRAVENOUS
  Administered 2019-08-11: 5 ug/min via INTRAVENOUS
  Administered 2019-08-12: 3 ug/min via INTRAVENOUS
  Filled 2019-08-10 (×2): qty 250

## 2019-08-10 MED ORDER — NOREPINEPHRINE 4 MG/250ML-% IV SOLN
INTRAVENOUS | Status: AC
  Administered 2019-08-10: 2 ug/min via INTRAVENOUS
  Filled 2019-08-10: qty 250

## 2019-08-10 MED ORDER — LOPERAMIDE HCL 1 MG/7.5ML PO SUSP
2.0000 mg | Freq: Two times a day (BID) | ORAL | Status: DC
  Administered 2019-08-10 (×2): 2 mg
  Filled 2019-08-10 (×2): qty 15

## 2019-08-10 MED ORDER — LORAZEPAM 2 MG/ML IJ SOLN
1.0000 mg | INTRAMUSCULAR | Status: DC | PRN
  Administered 2019-08-10 – 2019-08-24 (×7): 1 mg via INTRAVENOUS
  Filled 2019-08-10 (×7): qty 1

## 2019-08-10 MED ORDER — PRO-STAT SUGAR FREE PO LIQD
30.0000 mL | Freq: Two times a day (BID) | ORAL | Status: DC
  Administered 2019-08-10 – 2019-08-22 (×24): 30 mL
  Filled 2019-08-10 (×24): qty 30

## 2019-08-10 MED ORDER — VITAL 1.5 CAL PO LIQD
1000.0000 mL | ORAL | Status: DC
  Administered 2019-08-10 – 2019-08-16 (×8): 1000 mL
  Filled 2019-08-10 (×13): qty 1000

## 2019-08-10 MED ORDER — ALBUMIN HUMAN 5 % IV SOLN
25.0000 g | Freq: Once | INTRAVENOUS | Status: AC
  Administered 2019-08-10: 25 g via INTRAVENOUS
  Filled 2019-08-10: qty 500

## 2019-08-10 NOTE — Progress Notes (Signed)
Pt continues to be hypotensive and tachypneic, Lanelle Bal, RN and Dr. Grandville Silos at bedside. See assoc orders.
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
## 2019-08-10 NOTE — Progress Notes (Signed)
BP 90/61, HR 135-140bpm, RR 40-50, c/o difficulty breathing. Paged Dr. Grandville Silos. See associated orders.

## 2019-08-10 NOTE — Progress Notes (Signed)
Pt evaluated at [facility]. Pt con't on 34mcg of Levo RR 20s and resting comfortably. Adequate UOP  Con't supportive care.
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
## 2019-08-11 LAB — RENAL FUNCTION PANEL
Albumin: 2.1 g/dL — ABNORMAL LOW (ref 3.5–5.0)
Albumin: 2.1 g/dL — ABNORMAL LOW (ref 3.5–5.0)
Anion gap: 12 (ref 5–15)
Anion gap: 8 (ref 5–15)
BUN: 108 mg/dL — ABNORMAL HIGH (ref 6–20)
BUN: 103 mg/dL — ABNORMAL HIGH (ref 6–20)
CO2: 20 mmol/L — ABNORMAL LOW (ref 22–32)
CO2: 18 mmol/L — ABNORMAL LOW (ref 22–32)
Calcium: 8.2 mg/dL — ABNORMAL LOW (ref 8.9–10.3)
Calcium: 7.9 mg/dL — ABNORMAL LOW (ref 8.9–10.3)
Chloride: 110 mmol/L (ref 98–111)
Chloride: 113 mmol/L — ABNORMAL HIGH (ref 98–111)
Creatinine, Ser: 4.06 mg/dL — ABNORMAL HIGH (ref 0.61–1.24)
Creatinine, Ser: 3.67 mg/dL — ABNORMAL HIGH (ref 0.61–1.24)
GFR calc Af Amer: 17 mL/min — ABNORMAL LOW (ref >60)
GFR calc Af Amer: 20 mL/min — ABNORMAL LOW (ref >60)
GFR calc non Af Amer: 15 mL/min — ABNORMAL LOW (ref >60)
GFR calc non Af Amer: 17 mL/min — ABNORMAL LOW (ref >60)
Glucose, Bld: 132 mg/dL — ABNORMAL HIGH (ref 70–99)
Glucose, Bld: 137 mg/dL — ABNORMAL HIGH (ref 70–99)
Phosphorus: 6.1 mg/dL — ABNORMAL HIGH (ref 2.5–4.6)
Phosphorus: 5.8 mg/dL — ABNORMAL HIGH (ref 2.5–4.6)
Potassium: 5.2 mmol/L — ABNORMAL HIGH (ref 3.5–5.1)
Potassium: 5.2 mmol/L — ABNORMAL HIGH (ref 3.5–5.1)
Sodium: 142 mmol/L (ref 135–145)
Sodium: 139 mmol/L (ref 135–145)

## 2019-08-11 LAB — URINALYSIS, ROUTINE W REFLEX MICROSCOPIC
Bilirubin Urine: NEGATIVE
Glucose, UA: NEGATIVE mg/dL
Ketones, ur: NEGATIVE mg/dL
Leukocytes,Ua: NEGATIVE
Nitrite: NEGATIVE
Protein, ur: 30 mg/dL — AB
Specific Gravity, Urine: 1.015 (ref 1.005–1.030)
pH: 5 (ref 5.0–8.0)

## 2019-08-11 LAB — CBC
HCT: 19.7 % — ABNORMAL LOW (ref 39.0–52.0)
HCT: 27.7 % — ABNORMAL LOW (ref 39.0–52.0)
Hemoglobin: 6.1 g/dL — CL (ref 13.0–17.0)
Hemoglobin: 8.9 g/dL — ABNORMAL LOW (ref 13.0–17.0)
MCH: 30.5 pg (ref 26.0–34.0)
MCH: 30.3 pg (ref 26.0–34.0)
MCHC: 31 g/dL (ref 30.0–36.0)
MCHC: 32.1 g/dL (ref 30.0–36.0)
MCV: 98.5 fL (ref 80.0–100.0)
MCV: 94.2 fL (ref 80.0–100.0)
Platelets: 811 10*3/uL — ABNORMAL HIGH (ref 150–400)
Platelets: 755 10*3/uL — ABNORMAL HIGH (ref 150–400)
RBC: 2 MIL/uL — ABNORMAL LOW (ref 4.22–5.81)
RBC: 2.94 MIL/uL — ABNORMAL LOW (ref 4.22–5.81)
RDW: 16.4 % — ABNORMAL HIGH (ref 11.5–15.5)
RDW: 15.9 % — ABNORMAL HIGH (ref 11.5–15.5)
WBC: 24.4 10*3/uL — ABNORMAL HIGH (ref 4.0–10.5)
WBC: 27.7 10*3/uL — ABNORMAL HIGH (ref 4.0–10.5)
nRBC: 0 % (ref 0.0–0.2)
nRBC: 0 % (ref 0.0–0.2)

## 2019-08-11 LAB — POCT ACTIVATED CLOTTING TIME
Activated Clotting Time: 131 s
Activated Clotting Time: 136 s
Activated Clotting Time: 147 s
Activated Clotting Time: 153 s
Activated Clotting Time: 142 s
Activated Clotting Time: 153 s
Activated Clotting Time: 147 s

## 2019-08-11 LAB — GLUCOSE, CAPILLARY
Glucose-Capillary: 140 mg/dL — ABNORMAL HIGH (ref 70–99)
Glucose-Capillary: 131 mg/dL — ABNORMAL HIGH (ref 70–99)
Glucose-Capillary: 125 mg/dL — ABNORMAL HIGH (ref 70–99)
Glucose-Capillary: 140 mg/dL — ABNORMAL HIGH (ref 70–99)
Glucose-Capillary: 113 mg/dL — ABNORMAL HIGH (ref 70–99)
Glucose-Capillary: 122 mg/dL — ABNORMAL HIGH (ref 70–99)

## 2019-08-11 LAB — PREPARE RBC (CROSSMATCH)

## 2019-08-11 LAB — MAGNESIUM: Magnesium: 2.5 mg/dL — ABNORMAL HIGH (ref 1.7–2.4)

## 2019-08-11 LAB — APTT: aPTT: 34 seconds (ref 24–36)

## 2019-08-11 MED ORDER — PRISMASOL BGK 4/2.5 32-4-2.5 MEQ/L REPLACEMENT SOLN
Status: DC
  Administered 2019-08-11 – 2019-08-12 (×3): via INTRAVENOUS_CENTRAL
  Filled 2019-08-11 (×4): qty 5000

## 2019-08-11 MED ORDER — SODIUM CHLORIDE 0.9% IV SOLUTION: Freq: Once | INTRAVENOUS | Status: DC

## 2019-08-11 MED ORDER — LOPERAMIDE HCL 1 MG/7.5ML PO SUSP
4.0000 mg | Freq: Two times a day (BID) | ORAL | Status: DC
  Administered 2019-08-11 (×2): 4 mg
  Filled 2019-08-11 (×2): qty 30

## 2019-08-11 MED ORDER — LACTATED RINGERS IV BOLUS
500.0000 mL | Freq: Once | INTRAVENOUS | Status: AC
  Administered 2019-08-11: 500 mL via INTRAVENOUS

## 2019-08-11 MED ORDER — HEPARIN BOLUS VIA INFUSION (CRRT)
1000.0000 [IU] | INTRAVENOUS | Status: DC | PRN
  Administered 2019-08-11 (×5): 1000 [IU] via INTRAVENOUS_CENTRAL
  Filled 2019-08-11: qty 1000

## 2019-08-11 MED ORDER — PRISMASOL BGK 4/2.5 32-4-2.5 MEQ/L REPLACEMENT SOLN
Status: DC
  Administered 2019-08-11: 11:00:00 via INTRAVENOUS_CENTRAL
  Filled 2019-08-11 (×3): qty 5000

## 2019-08-11 MED ORDER — PRISMASOL BGK 4/2.5 32-4-2.5 MEQ/L IV SOLN
INTRAVENOUS | Status: DC
  Administered 2019-08-11 – 2019-08-15 (×23): via INTRAVENOUS_CENTRAL
  Filled 2019-08-11 (×34): qty 5000

## 2019-08-11 MED ORDER — HEPARIN SODIUM (PORCINE) 1000 UNIT/ML DIALYSIS
1000.0000 [IU] | INTRAMUSCULAR | Status: DC | PRN
  Filled 2019-08-11: qty 6

## 2019-08-11 MED ORDER — SODIUM CHLORIDE 0.9 % IV SOLN
250.0000 [IU]/h | INTRAVENOUS | Status: DC
  Administered 2019-08-11: 1250 [IU]/h via INTRAVENOUS_CENTRAL
  Administered 2019-08-11: 250 [IU]/h via INTRAVENOUS_CENTRAL
  Administered 2019-08-11: 1950 [IU]/h via INTRAVENOUS_CENTRAL
  Administered 2019-08-12 (×3): 2300 [IU]/h via INTRAVENOUS_CENTRAL
  Administered 2019-08-12: 2250 [IU]/h via INTRAVENOUS_CENTRAL
  Administered 2019-08-13 (×5): 2500 [IU]/h via INTRAVENOUS_CENTRAL
  Administered 2019-08-13: 2450 [IU]/h via INTRAVENOUS_CENTRAL
  Administered 2019-08-14 (×5): 2500 [IU]/h via INTRAVENOUS_CENTRAL
  Filled 2019-08-11 (×26): qty 2

## 2019-08-11 NOTE — Progress Notes (Signed)
SLP Cancellation Note  Patient Details Name: [name] MRN: [id number] DOB: 1961-01-06   Cancelled treatment:        Attempted to see for speech-language-cognitive assessment and dysphagia. RN stated he is not responding this morning. MD documentation notes CRRT held yesterday, more tachypneic. She is discontinuing po's as he has not been awake/able to consume. Not able to participate this am. Will follow.    [name] 08/11/2019, 9:08 AM  [name].Ed Risk analyst [phone] Office [phone]
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
## 2019-08-12 ENCOUNTER — Inpatient Hospital Stay (HOSPITAL_COMMUNITY): Payer: 59

## 2019-08-12 LAB — POCT ACTIVATED CLOTTING TIME
Activated Clotting Time: 158 s
Activated Clotting Time: 164 s
Activated Clotting Time: 164 s
Activated Clotting Time: 164 s
Activated Clotting Time: 169 s
Activated Clotting Time: 175 s
Activated Clotting Time: 175 s
Activated Clotting Time: 180 s
Activated Clotting Time: 180 s
Activated Clotting Time: 180 s
Activated Clotting Time: 191 s
Activated Clotting Time: 197 s
Activated Clotting Time: 197 s
Activated Clotting Time: 202 s

## 2019-08-12 LAB — CULTURE, BLOOD (ROUTINE X 2)
Culture: NO GROWTH
Culture: NO GROWTH

## 2019-08-12 LAB — RENAL FUNCTION PANEL
Albumin: 2.2 g/dL — ABNORMAL LOW (ref 3.5–5.0)
Albumin: 2.2 g/dL — ABNORMAL LOW (ref 3.5–5.0)
Albumin: 2.1 g/dL — ABNORMAL LOW (ref 3.5–5.0)
Anion gap: 10 (ref 5–15)
Anion gap: 9 (ref 5–15)
Anion gap: 10 (ref 5–15)
BUN: 61 mg/dL — ABNORMAL HIGH (ref 6–20)
BUN: 56 mg/dL — ABNORMAL HIGH (ref 6–20)
BUN: 53 mg/dL — ABNORMAL HIGH (ref 6–20)
CO2: 23 mmol/L (ref 22–32)
CO2: 26 mmol/L (ref 22–32)
CO2: 26 mmol/L (ref 22–32)
Calcium: 8.3 mg/dL — ABNORMAL LOW (ref 8.9–10.3)
Calcium: 8.5 mg/dL — ABNORMAL LOW (ref 8.9–10.3)
Calcium: 8.4 mg/dL — ABNORMAL LOW (ref 8.9–10.3)
Chloride: 106 mmol/L (ref 98–111)
Chloride: 104 mmol/L (ref 98–111)
Chloride: 101 mmol/L (ref 98–111)
Creatinine, Ser: 2.48 mg/dL — ABNORMAL HIGH (ref 0.61–1.24)
Creatinine, Ser: 2.15 mg/dL — ABNORMAL HIGH (ref 0.61–1.24)
Creatinine, Ser: 1.99 mg/dL — ABNORMAL HIGH (ref 0.61–1.24)
GFR calc Af Amer: 32 mL/min — ABNORMAL LOW (ref >60)
GFR calc Af Amer: 38 mL/min — ABNORMAL LOW (ref >60)
GFR calc Af Amer: 41 mL/min — ABNORMAL LOW (ref >60)
GFR calc non Af Amer: 27 mL/min — ABNORMAL LOW (ref >60)
GFR calc non Af Amer: 33 mL/min — ABNORMAL LOW (ref >60)
GFR calc non Af Amer: 36 mL/min — ABNORMAL LOW (ref >60)
Glucose, Bld: 130 mg/dL — ABNORMAL HIGH (ref 70–99)
Glucose, Bld: 133 mg/dL — ABNORMAL HIGH (ref 70–99)
Glucose, Bld: 136 mg/dL — ABNORMAL HIGH (ref 70–99)
Phosphorus: 3.4 mg/dL (ref 2.5–4.6)
Phosphorus: 3.9 mg/dL (ref 2.5–4.6)
Phosphorus: 3.9 mg/dL (ref 2.5–4.6)
Potassium: 5.1 mmol/L (ref 3.5–5.1)
Potassium: 5.2 mmol/L — ABNORMAL HIGH (ref 3.5–5.1)
Potassium: 4.8 mmol/L (ref 3.5–5.1)
Sodium: 139 mmol/L (ref 135–145)
Sodium: 139 mmol/L (ref 135–145)
Sodium: 137 mmol/L (ref 135–145)

## 2019-08-12 LAB — CBC
HCT: 26.5 % — ABNORMAL LOW (ref 39.0–52.0)
Hemoglobin: 8.5 g/dL — ABNORMAL LOW (ref 13.0–17.0)
MCH: 30 pg (ref 26.0–34.0)
MCHC: 32.1 g/dL (ref 30.0–36.0)
MCV: 93.6 fL (ref 80.0–100.0)
Platelets: 819 10*3/uL — ABNORMAL HIGH (ref 150–400)
RBC: 2.83 MIL/uL — ABNORMAL LOW (ref 4.22–5.81)
RDW: 16.3 % — ABNORMAL HIGH (ref 11.5–15.5)
WBC: 29.2 10*3/uL — ABNORMAL HIGH (ref 4.0–10.5)
nRBC: 0 % (ref 0.0–0.2)

## 2019-08-12 LAB — GLUCOSE, CAPILLARY
Glucose-Capillary: 125 mg/dL — ABNORMAL HIGH (ref 70–99)
Glucose-Capillary: 126 mg/dL — ABNORMAL HIGH (ref 70–99)
Glucose-Capillary: 113 mg/dL — ABNORMAL HIGH (ref 70–99)
Glucose-Capillary: 118 mg/dL — ABNORMAL HIGH (ref 70–99)
Glucose-Capillary: 122 mg/dL — ABNORMAL HIGH (ref 70–99)
Glucose-Capillary: 119 mg/dL — ABNORMAL HIGH (ref 70–99)

## 2019-08-12 LAB — PHOSPHORUS: Phosphorus: 3.4 mg/dL (ref 2.5–4.6)

## 2019-08-12 LAB — POTASSIUM: Potassium: 4.7 mmol/L (ref 3.5–5.1)

## 2019-08-12 LAB — MAGNESIUM: Magnesium: 2.3 mg/dL (ref 1.7–2.4)

## 2019-08-12 LAB — APTT: aPTT: 151 seconds — ABNORMAL HIGH (ref 24–36)

## 2019-08-12 MED ORDER — PRISMASOL BGK 0/2.5 32-2.5 MEQ/L REPLACEMENT SOLN
Status: DC
  Filled 2019-08-12 (×2): qty 5000

## 2019-08-12 MED ORDER — CALCIUM POLYCARBOPHIL 625 MG PO TABS
625.0000 mg | ORAL_TABLET | Freq: Two times a day (BID) | ORAL | Status: DC
  Administered 2019-08-12 – 2019-08-22 (×21): 625 mg
  Filled 2019-08-12 (×22): qty 1

## 2019-08-12 MED ORDER — LOPERAMIDE HCL 1 MG/7.5ML PO SUSP
4.0000 mg | Freq: Three times a day (TID) | ORAL | Status: DC
  Administered 2019-08-12 – 2019-08-16 (×13): 4 mg
  Filled 2019-08-12 (×12): qty 30

## 2019-08-12 MED ORDER — PRISMASOL BGK 4/2.5 32-4-2.5 MEQ/L REPLACEMENT SOLN
Status: DC
  Administered 2019-08-12: 22:00:00 via INTRAVENOUS_CENTRAL
  Filled 2019-08-12 (×2): qty 5000

## 2019-08-12 MED ORDER — BETHANECHOL CHLORIDE 25 MG PO TABS
25.0000 mg | ORAL_TABLET | Freq: Three times a day (TID) | ORAL | Status: DC
  Administered 2019-08-12 – 2019-08-22 (×32): 25 mg
  Filled 2019-08-12: qty 1
  Filled 2019-08-12: qty 3
  Filled 2019-08-12 (×3): qty 1
  Filled 2019-08-12 (×2): qty 3
  Filled 2019-08-12: qty 1
  Filled 2019-08-12: qty 2.5
  Filled 2019-08-12 (×4): qty 3
  Filled 2019-08-12 (×2): qty 1
  Filled 2019-08-12: qty 3
  Filled 2019-08-12 (×2): qty 1
  Filled 2019-08-12 (×5): qty 3
  Filled 2019-08-12 (×2): qty 1
  Filled 2019-08-12: qty 3
  Filled 2019-08-12 (×2): qty 1
  Filled 2019-08-12: qty 3
  Filled 2019-08-12 (×2): qty 1
  Filled 2019-08-12: qty 3
  Filled 2019-08-12: qty 1

## 2019-08-12 MED ORDER — PRISMASOL BGK 4/2.5 32-4-2.5 MEQ/L REPLACEMENT SOLN
Status: DC
  Administered 2019-08-12 – 2019-08-14 (×3): via INTRAVENOUS_CENTRAL
  Filled 2019-08-12 (×6): qty 5000
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
## 2019-08-12 NOTE — Progress Notes (Signed)
Orthopedic Tech Progress Note Patient Details:  [name] [facility] 1961-07-26 [id number]  Ortho Devices Type of Ortho Device: Wrist splint Ortho Device/Splint Location: left Ortho Device/Splint Interventions: Application   Post Interventions Patient Tolerated: Well Instructions Provided: Care of device   [name] 08/12/2019, 1:47 PM
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
## 2019-08-13 ENCOUNTER — Inpatient Hospital Stay (HOSPITAL_COMMUNITY): Payer: 59

## 2019-08-13 LAB — RENAL FUNCTION PANEL
Albumin: 2 g/dL — ABNORMAL LOW (ref 3.5–5.0)
Albumin: 2.4 g/dL — ABNORMAL LOW (ref 3.5–5.0)
Anion gap: 11 (ref 5–15)
Anion gap: 10 (ref 5–15)
BUN: 48 mg/dL — ABNORMAL HIGH (ref 6–20)
BUN: 60 mg/dL — ABNORMAL HIGH (ref 6–20)
CO2: 25 mmol/L (ref 22–32)
CO2: 24 mmol/L (ref 22–32)
Calcium: 8.3 mg/dL — ABNORMAL LOW (ref 8.9–10.3)
Calcium: 8.3 mg/dL — ABNORMAL LOW (ref 8.9–10.3)
Chloride: 102 mmol/L (ref 98–111)
Chloride: 102 mmol/L (ref 98–111)
Creatinine, Ser: 2.02 mg/dL — ABNORMAL HIGH (ref 0.61–1.24)
Creatinine, Ser: 2.56 mg/dL — ABNORMAL HIGH (ref 0.61–1.24)
GFR calc Af Amer: 41 mL/min — ABNORMAL LOW (ref >60)
GFR calc Af Amer: 31 mL/min — ABNORMAL LOW (ref >60)
GFR calc non Af Amer: 35 mL/min — ABNORMAL LOW (ref >60)
GFR calc non Af Amer: 26 mL/min — ABNORMAL LOW (ref >60)
Glucose, Bld: 127 mg/dL — ABNORMAL HIGH (ref 70–99)
Glucose, Bld: 135 mg/dL — ABNORMAL HIGH (ref 70–99)
Phosphorus: 3.6 mg/dL (ref 2.5–4.6)
Phosphorus: 4.3 mg/dL (ref 2.5–4.6)
Potassium: 5.3 mmol/L — ABNORMAL HIGH (ref 3.5–5.1)
Potassium: 5.2 mmol/L — ABNORMAL HIGH (ref 3.5–5.1)
Sodium: 138 mmol/L (ref 135–145)
Sodium: 136 mmol/L (ref 135–145)

## 2019-08-13 LAB — POCT ACTIVATED CLOTTING TIME
Activated Clotting Time: 179 s
Activated Clotting Time: 180 s
Activated Clotting Time: 186 s
Activated Clotting Time: 186 s
Activated Clotting Time: 186 s
Activated Clotting Time: 186 s
Activated Clotting Time: 191 s
Activated Clotting Time: 191 s
Activated Clotting Time: 191 s
Activated Clotting Time: 191 s
Activated Clotting Time: 191 s
Activated Clotting Time: 197 s
Activated Clotting Time: 202 s

## 2019-08-13 LAB — CBC
HCT: 23.7 % — ABNORMAL LOW (ref 39.0–52.0)
Hemoglobin: 7.5 g/dL — ABNORMAL LOW (ref 13.0–17.0)
MCH: 30.4 pg (ref 26.0–34.0)
MCHC: 31.6 g/dL (ref 30.0–36.0)
MCV: 96 fL (ref 80.0–100.0)
Platelets: 778 10*3/uL — ABNORMAL HIGH (ref 150–400)
RBC: 2.47 MIL/uL — ABNORMAL LOW (ref 4.22–5.81)
RDW: 15.9 % — ABNORMAL HIGH (ref 11.5–15.5)
WBC: 22.5 10*3/uL — ABNORMAL HIGH (ref 4.0–10.5)
nRBC: 0 % (ref 0.0–0.2)

## 2019-08-13 LAB — GLUCOSE, CAPILLARY
Glucose-Capillary: 116 mg/dL — ABNORMAL HIGH (ref 70–99)
Glucose-Capillary: 117 mg/dL — ABNORMAL HIGH (ref 70–99)
Glucose-Capillary: 123 mg/dL — ABNORMAL HIGH (ref 70–99)
Glucose-Capillary: 129 mg/dL — ABNORMAL HIGH (ref 70–99)
Glucose-Capillary: 124 mg/dL — ABNORMAL HIGH (ref 70–99)

## 2019-08-13 LAB — MAGNESIUM: Magnesium: 2.4 mg/dL (ref 1.7–2.4)

## 2019-08-13 LAB — CK: Total CK: 438 U/L — ABNORMAL HIGH (ref 49–397)

## 2019-08-13 LAB — APTT: aPTT: >200 seconds (ref 24–36)

## 2019-08-13 LAB — TROPONIN I (HIGH SENSITIVITY): Troponin I (High Sensitivity): 178 ng/L (ref <18)

## 2019-08-13 MED ORDER — SODIUM CHLORIDE 0.9% FLUSH
10.0000 mL | INTRAVENOUS | Status: DC | PRN
  Administered 2019-08-18: 10 mL
  Filled 2019-08-13: qty 40

## 2019-08-13 MED ORDER — ALBUMIN HUMAN 5 % IV SOLN
25.0000 g | Freq: Once | INTRAVENOUS | Status: AC
  Administered 2019-08-13: 25 g via INTRAVENOUS
  Filled 2019-08-13: qty 500

## 2019-08-13 MED ORDER — SODIUM CHLORIDE 0.9% FLUSH
10.0000 mL | Freq: Two times a day (BID) | INTRAVENOUS | Status: DC
  Administered 2019-08-13 – 2019-08-23 (×19): 10 mL

## 2019-08-13 MED ORDER — HYDROMORPHONE HCL 1 MG/ML IJ SOLN
1.0000 mg | Freq: Once | INTRAMUSCULAR | Status: AC
  Administered 2019-08-13: 1 mg via INTRAVENOUS

## 2019-08-13 MED ORDER — PRISMASOL BGK 0/2.5 32-2.5 MEQ/L REPLACEMENT SOLN
Status: DC
  Administered 2019-08-13 – 2019-08-14 (×2): via INTRAVENOUS_CENTRAL
  Filled 2019-08-13 (×5): qty 5000

## 2019-08-13 MED ORDER — HYDROMORPHONE HCL 1 MG/ML IJ SOLN
INTRAMUSCULAR | Status: AC
  Filled 2019-08-13: qty 1
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
## 2019-08-13 NOTE — Progress Notes (Signed)
Giving patient a bath and turned him to get old sheets out and hemodialysis catheter came out. Notified Nephrology and Trauma. New hemodialysis catheter will be placed this afternoon to allow CRRT to be restarted.  Will continue to monitor  Hiram Gash RN
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
## 2019-08-13 NOTE — Progress Notes (Signed)
CRITICAL VALUE ALERT  Critical Value:  Troponin 177   Date & Time Notied:  08/13/2019   1435  Provider Notified: [place]  Orders Received/Actions taken: No new orders at this time  Will continue to monitor. Hiram Gash RN

## 2019-08-13 NOTE — Progress Notes (Signed)
SLP Cancellation Note  Patient Details Name: [name] MRN: [id number] DOB: 03/19/1961   Cancelled treatment:        Spoke with pt for several minutes. Alert however not much change from yesterday- extremely weak, barely audible verbalizations. Was unable to initiate swallow yesterday during therapy- no need to attempt today/ give po's that he may aspirate. Did ask him over the weekend to practice initiating volitional coughs/throat clears and dry swallows (RN can give moist swab) to facilitate protective mechanisms for larynx and vocal cord adduction. He was in agreement with plan and recommendations. Continue frequent oral care.     [name] 08/13/2019, 11:39 AM   [name].Ed Risk analyst [phone] Office [phone]

## 2019-08-13 NOTE — Progress Notes (Signed)
CRITICAL VALUE ALERT  Critical Value:  PTT> 200  Date & Time Notied:  08/13/2019 0815  Provider Notified: [place]   Orders Received/Actions taken: No orders at this time. Will continue to monitor Hiram Gash RN

## 2019-08-13 NOTE — Procedures (Signed)
Hemodialysis Catheter Insertion Procedure Note BRIN SCATURRO [id number] 10-Jan-1961  Procedure: Insertion of Hemodialysis Catheter Indications: Dialysis Access   Procedure Details Consent: Risks of procedure as well as the alternatives and risks of each were explained to the (patient/caregiver).  Consent for procedure obtained. Time Out: Verified patient identification, verified procedure, site/side was marked, verified correct patient position, special equipment/implants available, medications/allergies/relevent history reviewed, required imaging and test results available.  Performed  Maximum sterile technique was used including antiseptics, cap, gloves, gown, hand hygiene, mask and sheet. Skin prep: Chlorhexidine; local anesthetic administered Triple lumen hemodialysis catheter was inserted into left subclavian vein using the Seldinger technique.  Evaluation Blood flow good Complications: No apparent complications Patient did tolerate procedure well. Chest X-ray ordered to verify placement.  CXR: pending.   Zenovia Jarred 08/13/2019
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
## 2019-08-14 LAB — GLUCOSE, CAPILLARY
Glucose-Capillary: 110 mg/dL — ABNORMAL HIGH (ref 70–99)
Glucose-Capillary: 113 mg/dL — ABNORMAL HIGH (ref 70–99)
Glucose-Capillary: 121 mg/dL — ABNORMAL HIGH (ref 70–99)
Glucose-Capillary: 125 mg/dL — ABNORMAL HIGH (ref 70–99)
Glucose-Capillary: 133 mg/dL — ABNORMAL HIGH (ref 70–99)
Glucose-Capillary: 134 mg/dL — ABNORMAL HIGH (ref 70–99)

## 2019-08-14 LAB — RENAL FUNCTION PANEL
Albumin: 2.2 g/dL — ABNORMAL LOW (ref 3.5–5.0)
Albumin: 2.3 g/dL — ABNORMAL LOW (ref 3.5–5.0)
Anion gap: 11 (ref 5–15)
Anion gap: 10 (ref 5–15)
BUN: 49 mg/dL — ABNORMAL HIGH (ref 6–20)
BUN: 42 mg/dL — ABNORMAL HIGH (ref 6–20)
CO2: 24 mmol/L (ref 22–32)
CO2: 24 mmol/L (ref 22–32)
Calcium: 8.2 mg/dL — ABNORMAL LOW (ref 8.9–10.3)
Calcium: 8.3 mg/dL — ABNORMAL LOW (ref 8.9–10.3)
Chloride: 101 mmol/L (ref 98–111)
Chloride: 102 mmol/L (ref 98–111)
Creatinine, Ser: 2.07 mg/dL — ABNORMAL HIGH (ref 0.61–1.24)
Creatinine, Ser: 1.7 mg/dL — ABNORMAL HIGH (ref 0.61–1.24)
GFR calc Af Amer: 39 mL/min — ABNORMAL LOW (ref >60)
GFR calc Af Amer: 50 mL/min — ABNORMAL LOW (ref >60)
GFR calc non Af Amer: 34 mL/min — ABNORMAL LOW (ref >60)
GFR calc non Af Amer: 43 mL/min — ABNORMAL LOW (ref >60)
Glucose, Bld: 138 mg/dL — ABNORMAL HIGH (ref 70–99)
Glucose, Bld: 130 mg/dL — ABNORMAL HIGH (ref 70–99)
Phosphorus: 3.3 mg/dL (ref 2.5–4.6)
Phosphorus: 3.2 mg/dL (ref 2.5–4.6)
Potassium: 4.7 mmol/L (ref 3.5–5.1)
Potassium: 4.7 mmol/L (ref 3.5–5.1)
Sodium: 136 mmol/L (ref 135–145)
Sodium: 136 mmol/L (ref 135–145)

## 2019-08-14 LAB — CBC
HCT: 18.9 % — ABNORMAL LOW (ref 39.0–52.0)
HCT: 26.4 % — ABNORMAL LOW (ref 39.0–52.0)
Hemoglobin: 5.8 g/dL — CL (ref 13.0–17.0)
Hemoglobin: 8.6 g/dL — ABNORMAL LOW (ref 13.0–17.0)
MCH: 30.1 pg (ref 26.0–34.0)
MCH: 30.1 pg (ref 26.0–34.0)
MCHC: 30.7 g/dL (ref 30.0–36.0)
MCHC: 32.6 g/dL (ref 30.0–36.0)
MCV: 97.9 fL (ref 80.0–100.0)
MCV: 92.3 fL (ref 80.0–100.0)
Platelets: 658 10*3/uL — ABNORMAL HIGH (ref 150–400)
Platelets: 596 10*3/uL — ABNORMAL HIGH (ref 150–400)
RBC: 1.93 MIL/uL — ABNORMAL LOW (ref 4.22–5.81)
RBC: 2.86 MIL/uL — ABNORMAL LOW (ref 4.22–5.81)
RDW: 15.9 % — ABNORMAL HIGH (ref 11.5–15.5)
RDW: 16.3 % — ABNORMAL HIGH (ref 11.5–15.5)
WBC: 20.8 10*3/uL — ABNORMAL HIGH (ref 4.0–10.5)
WBC: 20.5 10*3/uL — ABNORMAL HIGH (ref 4.0–10.5)
nRBC: 0 % (ref 0.0–0.2)
nRBC: 0 % (ref 0.0–0.2)

## 2019-08-14 LAB — POCT ACTIVATED CLOTTING TIME
Activated Clotting Time: 164 s
Activated Clotting Time: 169 s
Activated Clotting Time: 175 s
Activated Clotting Time: 180 s
Activated Clotting Time: 186 s
Activated Clotting Time: 191 s
Activated Clotting Time: 197 s
Activated Clotting Time: 202 s

## 2019-08-14 LAB — MAGNESIUM: Magnesium: 2.5 mg/dL — ABNORMAL HIGH (ref 1.7–2.4)

## 2019-08-14 LAB — APTT
aPTT: 178 seconds (ref 24–36)
aPTT: 139 seconds — ABNORMAL HIGH (ref 24–36)

## 2019-08-14 LAB — PREPARE RBC (CROSSMATCH)

## 2019-08-14 MED ORDER — SODIUM CHLORIDE 0.9% IV SOLUTION: Freq: Once | INTRAVENOUS | Status: DC

## 2019-08-14 MED ORDER — "THROMBI-PAD 3""X3"" EX PADS"
1.0000 | MEDICATED_PAD | Freq: Once | CUTANEOUS | Status: AC
  Administered 2019-08-14: 1 via TOPICAL
  Filled 2019-08-14: qty 1
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
## 2019-08-14 NOTE — Progress Notes (Signed)
Called for blood oozing at HD cath site.  Getting max heparin w/ CRRT at 2500 u/ hr and also SQ heparin.  PTT this am was highly elevated at 139.  Asked nurse to dc heparin SQ and also dc CRRT heparin for now.  May consider resuming at lower dose/ upper dose limit when bleeding stopped and PTT back down.   Kelly Splinter, MD 08/14/2019, 10:42 PM

## 2019-08-14 NOTE — Progress Notes (Signed)
Writer noted that the HD catheter site was oozing blood. MD notified, order new orders received. See chart.

## 2019-08-15 LAB — CBC
HCT: 25.3 % — ABNORMAL LOW (ref 39.0–52.0)
Hemoglobin: 8 g/dL — ABNORMAL LOW (ref 13.0–17.0)
MCH: 30.2 pg (ref 26.0–34.0)
MCHC: 31.6 g/dL (ref 30.0–36.0)
MCV: 95.5 fL (ref 80.0–100.0)
Platelets: 570 10*3/uL — ABNORMAL HIGH (ref 150–400)
RBC: 2.65 MIL/uL — ABNORMAL LOW (ref 4.22–5.81)
RDW: 16.8 % — ABNORMAL HIGH (ref 11.5–15.5)
WBC: 19.1 10*3/uL — ABNORMAL HIGH (ref 4.0–10.5)
nRBC: 0 % (ref 0.0–0.2)

## 2019-08-15 LAB — GLUCOSE, CAPILLARY
Glucose-Capillary: 107 mg/dL — ABNORMAL HIGH (ref 70–99)
Glucose-Capillary: 113 mg/dL — ABNORMAL HIGH (ref 70–99)
Glucose-Capillary: 115 mg/dL — ABNORMAL HIGH (ref 70–99)
Glucose-Capillary: 126 mg/dL — ABNORMAL HIGH (ref 70–99)
Glucose-Capillary: 137 mg/dL — ABNORMAL HIGH (ref 70–99)
Glucose-Capillary: 139 mg/dL — ABNORMAL HIGH (ref 70–99)
Glucose-Capillary: 144 mg/dL — ABNORMAL HIGH (ref 70–99)

## 2019-08-15 LAB — TYPE AND SCREEN
ABO/RH(D): A POS
Antibody Screen: NEGATIVE
Unit division: 0
Unit division: 0
Unit division: 0
Unit division: 0

## 2019-08-15 LAB — BPAM RBC
Blood Product Expiration Date: 202011292359
Blood Product Expiration Date: 202011292359
Blood Product Expiration Date: 202012062359
Blood Product Expiration Date: 202012072359
ISSUE DATE / TIME: 202011040803
ISSUE DATE / TIME: 202011040943
ISSUE DATE / TIME: 202011070728
ISSUE DATE / TIME: 202011070910
Unit Type and Rh: 6200
Unit Type and Rh: 6200
Unit Type and Rh: 6200
Unit Type and Rh: 6200

## 2019-08-15 LAB — BASIC METABOLIC PANEL
Anion gap: 10 (ref 5–15)
BUN: 40 mg/dL — ABNORMAL HIGH (ref 6–20)
CO2: 24 mmol/L (ref 22–32)
Calcium: 8.4 mg/dL — ABNORMAL LOW (ref 8.9–10.3)
Chloride: 102 mmol/L (ref 98–111)
Creatinine, Ser: 1.68 mg/dL — ABNORMAL HIGH (ref 0.61–1.24)
GFR calc Af Amer: 51 mL/min — ABNORMAL LOW (ref >60)
GFR calc non Af Amer: 44 mL/min — ABNORMAL LOW (ref >60)
Glucose, Bld: 121 mg/dL — ABNORMAL HIGH (ref 70–99)
Potassium: 4.9 mmol/L (ref 3.5–5.1)
Sodium: 136 mmol/L (ref 135–145)

## 2019-08-15 LAB — PHOSPHORUS: Phosphorus: 3.5 mg/dL (ref 2.5–4.6)

## 2019-08-15 LAB — MAGNESIUM: Magnesium: 2.4 mg/dL (ref 1.7–2.4)

## 2019-08-15 LAB — APTT: aPTT: 28 seconds (ref 24–36)

## 2019-08-15 MED ORDER — HEPARIN SODIUM (PORCINE) 1000 UNIT/ML IJ SOLN
2000.0000 [IU] | Freq: Once | INTRAMUSCULAR | Status: AC
  Administered 2019-08-15: 2000 [IU] via INTRAVENOUS

## 2019-08-15 NOTE — Progress Notes (Signed)
Verbal order from Dr.Bhandari to stop CRRT for now.
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
## 2019-08-16 LAB — RENAL FUNCTION PANEL
Albumin: 2.2 g/dL — ABNORMAL LOW (ref 3.5–5.0)
Anion gap: 10 (ref 5–15)
BUN: 71 mg/dL — ABNORMAL HIGH (ref 6–20)
CO2: 21 mmol/L — ABNORMAL LOW (ref 22–32)
Calcium: 8.4 mg/dL — ABNORMAL LOW (ref 8.9–10.3)
Chloride: 108 mmol/L (ref 98–111)
Creatinine, Ser: 2.55 mg/dL — ABNORMAL HIGH (ref 0.61–1.24)
GFR calc Af Amer: 31 mL/min — ABNORMAL LOW (ref >60)
GFR calc non Af Amer: 26 mL/min — ABNORMAL LOW (ref >60)
Glucose, Bld: 136 mg/dL — ABNORMAL HIGH (ref 70–99)
Phosphorus: 5.5 mg/dL — ABNORMAL HIGH (ref 2.5–4.6)
Potassium: 5.4 mmol/L — ABNORMAL HIGH (ref 3.5–5.1)
Sodium: 139 mmol/L (ref 135–145)

## 2019-08-16 LAB — CULTURE, BLOOD (ROUTINE X 2)
Culture: NO GROWTH
Culture: NO GROWTH
Special Requests: ADEQUATE

## 2019-08-16 LAB — GLUCOSE, CAPILLARY
Glucose-Capillary: 130 mg/dL — ABNORMAL HIGH (ref 70–99)
Glucose-Capillary: 139 mg/dL — ABNORMAL HIGH (ref 70–99)
Glucose-Capillary: 124 mg/dL — ABNORMAL HIGH (ref 70–99)
Glucose-Capillary: 127 mg/dL — ABNORMAL HIGH (ref 70–99)
Glucose-Capillary: 125 mg/dL — ABNORMAL HIGH (ref 70–99)
Glucose-Capillary: 123 mg/dL — ABNORMAL HIGH (ref 70–99)

## 2019-08-16 LAB — CBC
HCT: 24.6 % — ABNORMAL LOW (ref 39.0–52.0)
Hemoglobin: 7.6 g/dL — ABNORMAL LOW (ref 13.0–17.0)
MCH: 29.8 pg (ref 26.0–34.0)
MCHC: 30.9 g/dL (ref 30.0–36.0)
MCV: 96.5 fL (ref 80.0–100.0)
Platelets: 540 10*3/uL — ABNORMAL HIGH (ref 150–400)
RBC: 2.55 MIL/uL — ABNORMAL LOW (ref 4.22–5.81)
RDW: 16.4 % — ABNORMAL HIGH (ref 11.5–15.5)
WBC: 17.6 10*3/uL — ABNORMAL HIGH (ref 4.0–10.5)
nRBC: 0 % (ref 0.0–0.2)

## 2019-08-16 LAB — POCT ACTIVATED CLOTTING TIME: Activated Clotting Time: 213 s

## 2019-08-16 LAB — VITAMIN B12: Vitamin B-12: 694 pg/mL (ref 180–914)

## 2019-08-16 LAB — MAGNESIUM: Magnesium: 2.1 mg/dL (ref 1.7–2.4)

## 2019-08-16 MED ORDER — NOREPINEPHRINE 4 MG/250ML-% IV SOLN: 0.0000 ug/min | INTRAVENOUS | Status: DC

## 2019-08-16 MED ORDER — PROPRANOLOL HCL 10 MG PO TABS
10.0000 mg | ORAL_TABLET | Freq: Three times a day (TID) | ORAL | Status: DC
  Administered 2019-08-16 – 2019-08-22 (×19): 10 mg via ORAL
  Filled 2019-08-16 (×22): qty 1

## 2019-08-16 MED ORDER — FENTANYL BOLUS VIA INFUSION
50.0000 ug | INTRAVENOUS | Status: DC | PRN
  Filled 2019-08-16: qty 50

## 2019-08-16 MED ORDER — LOPERAMIDE HCL 1 MG/7.5ML PO SUSP
3.0000 mg | Freq: Three times a day (TID) | ORAL | Status: DC
  Administered 2019-08-16 – 2019-08-17 (×5): 3 mg
  Filled 2019-08-16 (×2): qty 22.5
  Filled 2019-08-16 (×3): qty 30
  Filled 2019-08-16: qty 22.5

## 2019-08-16 MED ORDER — SODIUM ZIRCONIUM CYCLOSILICATE 10 G PO PACK
10.0000 g | PACK | Freq: Once | ORAL | Status: AC
  Administered 2019-08-16: 10 g via ORAL
  Filled 2019-08-16: qty 1

## 2019-08-16 MED ORDER — SODIUM POLYSTYRENE SULFONATE 15 GM/60ML PO SUSP: 30.0000 g | Freq: Once | ORAL | Status: DC

## 2019-08-16 MED ORDER — FENTANYL 2500MCG IN NS 250ML (10MCG/ML) PREMIX INFUSION: 50.0000 ug/h | INTRAVENOUS | Status: DC

## 2019-08-16 MED ORDER — FENTANYL CITRATE (PF) 100 MCG/2ML IJ SOLN: 50.0000 ug | Freq: Once | INTRAMUSCULAR | Status: DC

## 2019-08-16 MED ORDER — LACTATED RINGERS IV BOLUS: 1000.0000 mL | Freq: Once | INTRAVENOUS | Status: DC
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
## 2019-08-16 NOTE — Progress Notes (Signed)
PT Cancellation Note  Patient Details Name: [name] MRN: [id number] DOB: [birth year]   Cancelled Treatment:    Reason Eval/Treat Not Completed: Fatigue/lethargy limiting ability to participate(pt just finished with bath and supine HR 140 with pt stating fatigue and not agreeable to mobility at this time)   Yanelis Osika B Dwyane Dupree 08/16/2019, 9:45 AM  Bayard Males, PT Acute Rehabilitation Services Pager: [phone] Office: [phone]
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
## 2019-08-16 NOTE — Consult Note (Signed)
[place] Nurse ostomy follow up Patient receiving care in [facility] 4N17 Stoma type/location: RUQ ileostomy, high output Stomal assessment/size: 1 inch, round, red, moist, producing large volume of liquid brown effluent. Peristomal assessment: intact Treatment options for stomal/peristomal skin: barrier ring Output: to bedside drainage bag  Ostomy pouching: 2pc. High output pouches, skin barriers, barrier rings in room Education provided: none, patient not ready. Enrolled patient in [place] Start Discharge program: No Val Riles, RN, MSN, [facility], CNS-BC, pager [phone]

## 2019-08-16 NOTE — Progress Notes (Signed)
OT Cancellation Note  Patient Details Name: [name] MRN: [id number] DOB: Jul 31, 1961   Cancelled Treatment:    Reason Eval/Treat Not Completed: Patient declined, no reason specified.  Will reattempt.  Lucille Passy, OTR/L [place] Pager [phone] Office [phone]   Lucille Passy M 08/16/2019, 9:48 AM

## 2019-08-17 ENCOUNTER — Inpatient Hospital Stay (HOSPITAL_COMMUNITY): Payer: 59

## 2019-08-17 LAB — CBC
HCT: 24.3 % — ABNORMAL LOW (ref 39.0–52.0)
Hemoglobin: 7.4 g/dL — ABNORMAL LOW (ref 13.0–17.0)
MCH: 30.1 pg (ref 26.0–34.0)
MCHC: 30.5 g/dL (ref 30.0–36.0)
MCV: 98.8 fL (ref 80.0–100.0)
Platelets: 602 10*3/uL — ABNORMAL HIGH (ref 150–400)
RBC: 2.46 MIL/uL — ABNORMAL LOW (ref 4.22–5.81)
RDW: 16.7 % — ABNORMAL HIGH (ref 11.5–15.5)
WBC: 13 10*3/uL — ABNORMAL HIGH (ref 4.0–10.5)
nRBC: 0 % (ref 0.0–0.2)

## 2019-08-17 LAB — RENAL FUNCTION PANEL
Albumin: 2.3 g/dL — ABNORMAL LOW (ref 3.5–5.0)
Albumin: 2.3 g/dL — ABNORMAL LOW (ref 3.5–5.0)
Albumin: 2.2 g/dL — ABNORMAL LOW (ref 3.5–5.0)
Anion gap: 12 (ref 5–15)
Anion gap: 12 (ref 5–15)
Anion gap: 11 (ref 5–15)
BUN: 80 mg/dL — ABNORMAL HIGH (ref 6–20)
BUN: 80 mg/dL — ABNORMAL HIGH (ref 6–20)
BUN: 90 mg/dL — ABNORMAL HIGH (ref 6–20)
CO2: 23 mmol/L (ref 22–32)
CO2: 22 mmol/L (ref 22–32)
CO2: 22 mmol/L (ref 22–32)
Calcium: 8.6 mg/dL — ABNORMAL LOW (ref 8.9–10.3)
Calcium: 8.6 mg/dL — ABNORMAL LOW (ref 8.9–10.3)
Calcium: 8.7 mg/dL — ABNORMAL LOW (ref 8.9–10.3)
Chloride: 105 mmol/L (ref 98–111)
Chloride: 106 mmol/L (ref 98–111)
Chloride: 110 mmol/L (ref 98–111)
Creatinine, Ser: 2.69 mg/dL — ABNORMAL HIGH (ref 0.61–1.24)
Creatinine, Ser: 2.78 mg/dL — ABNORMAL HIGH (ref 0.61–1.24)
Creatinine, Ser: 2.56 mg/dL — ABNORMAL HIGH (ref 0.61–1.24)
GFR calc Af Amer: 29 mL/min — ABNORMAL LOW (ref >60)
GFR calc Af Amer: 28 mL/min — ABNORMAL LOW (ref >60)
GFR calc Af Amer: 31 mL/min — ABNORMAL LOW (ref >60)
GFR calc non Af Amer: 25 mL/min — ABNORMAL LOW (ref >60)
GFR calc non Af Amer: 24 mL/min — ABNORMAL LOW (ref >60)
GFR calc non Af Amer: 26 mL/min — ABNORMAL LOW (ref >60)
Glucose, Bld: 124 mg/dL — ABNORMAL HIGH (ref 70–99)
Glucose, Bld: 124 mg/dL — ABNORMAL HIGH (ref 70–99)
Glucose, Bld: 129 mg/dL — ABNORMAL HIGH (ref 70–99)
Phosphorus: 5.9 mg/dL — ABNORMAL HIGH (ref 2.5–4.6)
Phosphorus: 5.9 mg/dL — ABNORMAL HIGH (ref 2.5–4.6)
Phosphorus: 5.2 mg/dL — ABNORMAL HIGH (ref 2.5–4.6)
Potassium: 5.6 mmol/L — ABNORMAL HIGH (ref 3.5–5.1)
Potassium: 5.7 mmol/L — ABNORMAL HIGH (ref 3.5–5.1)
Potassium: 5 mmol/L (ref 3.5–5.1)
Sodium: 140 mmol/L (ref 135–145)
Sodium: 140 mmol/L (ref 135–145)
Sodium: 143 mmol/L (ref 135–145)

## 2019-08-17 LAB — GLUCOSE, CAPILLARY
Glucose-Capillary: 127 mg/dL — ABNORMAL HIGH (ref 70–99)
Glucose-Capillary: 130 mg/dL — ABNORMAL HIGH (ref 70–99)
Glucose-Capillary: 132 mg/dL — ABNORMAL HIGH (ref 70–99)
Glucose-Capillary: 138 mg/dL — ABNORMAL HIGH (ref 70–99)
Glucose-Capillary: 139 mg/dL — ABNORMAL HIGH (ref 70–99)
Glucose-Capillary: 140 mg/dL — ABNORMAL HIGH (ref 70–99)
Glucose-Capillary: 146 mg/dL — ABNORMAL HIGH (ref 70–99)

## 2019-08-17 LAB — FOLATE RBC
Folate, Hemolysate: 273 ng/mL
Folate, RBC: 1192 ng/mL (ref >498)
Hematocrit: 22.9 % — ABNORMAL LOW (ref 37.5–51.0)

## 2019-08-17 MED ORDER — VITAL 1.5 CAL PO LIQD
1000.0000 mL | ORAL | Status: DC
  Administered 2019-08-17 – 2019-08-21 (×6): 1000 mL
  Filled 2019-08-17 (×10): qty 1000
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
## 2019-08-17 NOTE — Progress Notes (Signed)
Pt arrived on floor, transferred from ICU. Patient is A&OX4 and in no acute distress. Pt states pain is 8/10, with no complaints of nausea, pain medicated with Oxycodone. Dobhoff tube infusing Vital 1.5 at 65ml/hr. Skin with left clavicle incision, LLE incision, intact with sutures, open to air. Bruising also to his LLE. Buttocks and sacral intact. Pt's only complaint in pain to bilateral knees, pending MRI.  Patient orienated to room and unit. Bedside table and personal belongings within reach of patient. Patient educated on usage of nurse call bell, placed within reach of patient. Patient instructed to call for assistance. Patient in bed. Will continue to monitor.
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
## 2019-08-17 NOTE — Progress Notes (Signed)
Large amount of stool/blood liquid mixture (rust color) from rectum x2 this shift

## 2019-08-18 DIAGNOSIS — S42002A Fracture of unspecified part of left clavicle, initial encounter for closed fracture: Secondary | ICD-10-CM

## 2019-08-18 DIAGNOSIS — I10 Essential (primary) hypertension: Secondary | ICD-10-CM

## 2019-08-18 DIAGNOSIS — G8918 Other acute postprocedural pain: Secondary | ICD-10-CM

## 2019-08-18 DIAGNOSIS — S27322A Contusion of lung, bilateral, initial encounter: Secondary | ICD-10-CM

## 2019-08-18 DIAGNOSIS — S62102A Fracture of unspecified carpal bone, left wrist, initial encounter for closed fracture: Secondary | ICD-10-CM

## 2019-08-18 DIAGNOSIS — R000 Tachycardia, unspecified: Secondary | ICD-10-CM

## 2019-08-18 DIAGNOSIS — D72829 Elevated white blood cell count, unspecified: Secondary | ICD-10-CM

## 2019-08-18 DIAGNOSIS — S36899A Unspecified injury of other intra-abdominal organs, initial encounter: Secondary | ICD-10-CM

## 2019-08-18 DIAGNOSIS — T07XXXA Unspecified multiple injuries, initial encounter: Secondary | ICD-10-CM

## 2019-08-18 DIAGNOSIS — R0682 Tachypnea, not elsewhere classified: Secondary | ICD-10-CM

## 2019-08-18 DIAGNOSIS — I6389 Other cerebral infarction: Secondary | ICD-10-CM

## 2019-08-18 DIAGNOSIS — I69391 Dysphagia following cerebral infarction: Secondary | ICD-10-CM

## 2019-08-18 DIAGNOSIS — D62 Acute posthemorrhagic anemia: Secondary | ICD-10-CM

## 2019-08-18 LAB — CBC
HCT: 25.3 % — ABNORMAL LOW (ref 39.0–52.0)
Hemoglobin: 7.8 g/dL — ABNORMAL LOW (ref 13.0–17.0)
MCH: 30 pg (ref 26.0–34.0)
MCHC: 30.8 g/dL (ref 30.0–36.0)
MCV: 97.3 fL (ref 80.0–100.0)
Platelets: 627 10*3/uL — ABNORMAL HIGH (ref 150–400)
RBC: 2.6 MIL/uL — ABNORMAL LOW (ref 4.22–5.81)
RDW: 16.7 % — ABNORMAL HIGH (ref 11.5–15.5)
WBC: 11.7 10*3/uL — ABNORMAL HIGH (ref 4.0–10.5)
nRBC: 0 % (ref 0.0–0.2)

## 2019-08-18 LAB — RENAL FUNCTION PANEL
Albumin: 2.2 g/dL — ABNORMAL LOW (ref 3.5–5.0)
Anion gap: 12 (ref 5–15)
BUN: 97 mg/dL — ABNORMAL HIGH (ref 6–20)
CO2: 21 mmol/L — ABNORMAL LOW (ref 22–32)
Calcium: 8.8 mg/dL — ABNORMAL LOW (ref 8.9–10.3)
Chloride: 114 mmol/L — ABNORMAL HIGH (ref 98–111)
Creatinine, Ser: 2.53 mg/dL — ABNORMAL HIGH (ref 0.61–1.24)
GFR calc Af Amer: 31 mL/min — ABNORMAL LOW (ref >60)
GFR calc non Af Amer: 27 mL/min — ABNORMAL LOW (ref >60)
Glucose, Bld: 131 mg/dL — ABNORMAL HIGH (ref 70–99)
Phosphorus: 5.6 mg/dL — ABNORMAL HIGH (ref 2.5–4.6)
Potassium: 5.5 mmol/L — ABNORMAL HIGH (ref 3.5–5.1)
Sodium: 147 mmol/L — ABNORMAL HIGH (ref 135–145)

## 2019-08-18 LAB — FOLATE RBC
Folate, Hemolysate: 312 ng/mL
Folate, RBC: 1381 ng/mL (ref >498)
Hematocrit: 22.6 % — ABNORMAL LOW (ref 37.5–51.0)

## 2019-08-18 LAB — GLUCOSE, CAPILLARY
Glucose-Capillary: 122 mg/dL — ABNORMAL HIGH (ref 70–99)
Glucose-Capillary: 139 mg/dL — ABNORMAL HIGH (ref 70–99)
Glucose-Capillary: 132 mg/dL — ABNORMAL HIGH (ref 70–99)
Glucose-Capillary: 129 mg/dL — ABNORMAL HIGH (ref 70–99)

## 2019-08-18 LAB — APTT: aPTT: 28 seconds (ref 24–36)

## 2019-08-18 LAB — MAGNESIUM: Magnesium: 2 mg/dL (ref 1.7–2.4)

## 2019-08-18 MED ORDER — ASPIRIN 81 MG PO CHEW
324.0000 mg | CHEWABLE_TABLET | Freq: Every day | ORAL | Status: DC
  Administered 2019-08-18 – 2019-08-20 (×3): 324 mg
  Filled 2019-08-18 (×4): qty 4

## 2019-08-18 MED ORDER — SODIUM ZIRCONIUM CYCLOSILICATE 10 G PO PACK
10.0000 g | PACK | Freq: Once | ORAL | Status: AC
  Administered 2019-08-18: 10 g via ORAL
  Filled 2019-08-18: qty 1

## 2019-08-18 MED ORDER — LOPERAMIDE HCL 1 MG/7.5ML PO SUSP
3.0000 mg | Freq: Two times a day (BID) | ORAL | Status: DC
  Administered 2019-08-18 – 2019-08-22 (×9): 3 mg
  Filled 2019-08-18 (×10): qty 22.5

## 2019-08-18 MED ORDER — HYDROMORPHONE HCL 1 MG/ML IJ SOLN
0.5000 mg | INTRAMUSCULAR | Status: DC | PRN
  Administered 2019-08-18 – 2019-08-22 (×6): 0.5 mg via INTRAVENOUS
  Filled 2019-08-18 (×6): qty 1

## 2019-08-18 MED ORDER — FREE WATER
100.0000 mL | Freq: Three times a day (TID) | Status: DC
  Administered 2019-08-18 – 2019-08-19 (×3): 100 mL

## 2019-08-18 NOTE — Progress Notes (Signed)
Inpatient Rehabilitation Admissions Coordinator  Inpatient rehab consult received. I met with patient with his wife at bedside for rehab assessment. We discussed goals, expectations, ELOS, wheelchair level goals initially for d/c home. Wife can take FMLA after CIR admit and has numerous family members who can assist with his care. She prefers inpt rehab at [facility]. Her father was a previous patient at CIR. I await further progress with therapies and medical readiness to admit to CIR and begin insurance authorization. Both pt and spouse in agreement. Patient is an excellent candidate for admission.  Danne Baxter, RN, MSN Rehab Admissions Coordinator [phone] 08/18/2019 12:20 PM
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
## 2019-08-18 NOTE — Progress Notes (Signed)
Patient ID: [name], male   DOB: 06/07/1961, 58 y.o.   MRN: [id number]   LOS: 21 days   Subjective: Asked to see pt for continued LLE pain and swelling. Trauma reported that he develops a bulge on his anterior lower leg when he stands. Pt reports pain but can't articulate very well about type. He is quite dysarthric still so communication remains difficult.   Objective: Vital signs in last 24 hours: Temp:  [97.7 F (36.5 C)-98.6 F (37 C)] 98.6 F (37 C) (11/10 2340) Pulse Rate:  [116-131] 124 (11/10 2340) Resp:  [16-28] 28 (11/11 0210) BP: (98-128)/(68-85) 115/83 (11/10 2340) SpO2:  [94 %-97 %] 96 % (11/10 2340) Last BM Date: 08/18/19(-Ostomy. )   Laboratory  CBC Recent Labs    08/17/19 0424 08/18/19 0149  WBC 13.0* 11.7*  HGB 7.4* 7.8*  HCT 24.3* 25.3*  PLT 602* 627*   BMET Recent Labs    08/17/19 0424 08/18/19 0149  NA 143 147*  K 5.0 5.5*  CL 110 114*  CO2 22 21*  GLUCOSE 129* 131*  BUN 90* 97*  CREATININE 2.56* 2.53*  CALCIUM 8.7* 8.8*     Physical Exam General appearance: alert and no distress  LLE: Ecchymoses from knee to foot, diffuse TTP, I maybe appreciate some fullness lateral to prox 1/3 of tibia, 2+ DP w/o edema   Assessment/Plan: LLE pain -- Repeat x-rays were negative and MRI was extremely limited but can r/o occult fx. 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 May be beneficial to repeat MRI when pt can tolerate it better. For now I would not limit activity and he can be WBAT LLE.    Lisette Abu, PA-C Orthopedic Surgery [phone] 08/18/2019
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
## 2019-08-19 LAB — CBC
HCT: 25.8 % — ABNORMAL LOW (ref 39.0–52.0)
HCT: 25.5 % — ABNORMAL LOW (ref 39.0–52.0)
Hemoglobin: 7.7 g/dL — ABNORMAL LOW (ref 13.0–17.0)
Hemoglobin: 7.7 g/dL — ABNORMAL LOW (ref 13.0–17.0)
MCH: 29.2 pg (ref 26.0–34.0)
MCH: 29.7 pg (ref 26.0–34.0)
MCHC: 29.8 g/dL — ABNORMAL LOW (ref 30.0–36.0)
MCHC: 30.2 g/dL (ref 30.0–36.0)
MCV: 97.7 fL (ref 80.0–100.0)
MCV: 98.5 fL (ref 80.0–100.0)
Platelets: 660 10*3/uL — ABNORMAL HIGH (ref 150–400)
Platelets: 649 10*3/uL — ABNORMAL HIGH (ref 150–400)
RBC: 2.64 MIL/uL — ABNORMAL LOW (ref 4.22–5.81)
RBC: 2.59 MIL/uL — ABNORMAL LOW (ref 4.22–5.81)
RDW: 16.4 % — ABNORMAL HIGH (ref 11.5–15.5)
RDW: 16 % — ABNORMAL HIGH (ref 11.5–15.5)
WBC: 10.9 10*3/uL — ABNORMAL HIGH (ref 4.0–10.5)
WBC: 9.6 10*3/uL (ref 4.0–10.5)
nRBC: 0 % (ref 0.0–0.2)
nRBC: 0 % (ref 0.0–0.2)

## 2019-08-19 LAB — PREPARE RBC (CROSSMATCH)

## 2019-08-19 LAB — GLUCOSE, CAPILLARY
Glucose-Capillary: 120 mg/dL — ABNORMAL HIGH (ref 70–99)
Glucose-Capillary: 121 mg/dL — ABNORMAL HIGH (ref 70–99)
Glucose-Capillary: 124 mg/dL — ABNORMAL HIGH (ref 70–99)
Glucose-Capillary: 127 mg/dL — ABNORMAL HIGH (ref 70–99)
Glucose-Capillary: 128 mg/dL — ABNORMAL HIGH (ref 70–99)
Glucose-Capillary: 134 mg/dL — ABNORMAL HIGH (ref 70–99)

## 2019-08-19 LAB — RENAL FUNCTION PANEL
Albumin: 2.2 g/dL — ABNORMAL LOW (ref 3.5–5.0)
Anion gap: 12 (ref 5–15)
BUN: 94 mg/dL — ABNORMAL HIGH (ref 6–20)
CO2: 20 mmol/L — ABNORMAL LOW (ref 22–32)
Calcium: 8.7 mg/dL — ABNORMAL LOW (ref 8.9–10.3)
Chloride: 116 mmol/L — ABNORMAL HIGH (ref 98–111)
Creatinine, Ser: 2.18 mg/dL — ABNORMAL HIGH (ref 0.61–1.24)
GFR calc Af Amer: 37 mL/min — ABNORMAL LOW (ref >60)
GFR calc non Af Amer: 32 mL/min — ABNORMAL LOW (ref >60)
Glucose, Bld: 149 mg/dL — ABNORMAL HIGH (ref 70–99)
Phosphorus: 4.9 mg/dL — ABNORMAL HIGH (ref 2.5–4.6)
Potassium: 5 mmol/L (ref 3.5–5.1)
Sodium: 148 mmol/L — ABNORMAL HIGH (ref 135–145)

## 2019-08-19 LAB — MAGNESIUM: Magnesium: 1.9 mg/dL (ref 1.7–2.4)

## 2019-08-19 LAB — APTT: aPTT: 26 seconds (ref 24–36)

## 2019-08-19 MED ORDER — SODIUM CHLORIDE 0.9% IV SOLUTION
Freq: Once | INTRAVENOUS | Status: AC
  Administered 2019-08-19: via INTRAVENOUS

## 2019-08-19 MED ORDER — FREE WATER
250.0000 mL | Status: DC
  Administered 2019-08-19 – 2019-08-20 (×6): 250 mL

## 2019-08-19 MED ORDER — SODIUM CHLORIDE 0.9% IV SOLUTION: Freq: Once | INTRAVENOUS | Status: DC

## 2019-08-19 MED ORDER — FREE WATER: 150.0000 mL | Freq: Three times a day (TID) | Status: DC
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
## 2019-08-19 NOTE — Progress Notes (Signed)
Inpatient Rehabilitation Admissions Coordinator  Noted pain limiting ability to participate with therapy. Await further pain control and then will proceed with insurance approval for a possible inpt rehab admit early next week. Noted HD catheter to be removed.  Danne Baxter, RN, MSN Rehab Admissions Coordinator [phone] 08/19/2019 8:06 PM
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
## 2019-08-20 LAB — GLUCOSE, CAPILLARY
Glucose-Capillary: 105 mg/dL — ABNORMAL HIGH (ref 70–99)
Glucose-Capillary: 109 mg/dL — ABNORMAL HIGH (ref 70–99)
Glucose-Capillary: 113 mg/dL — ABNORMAL HIGH (ref 70–99)
Glucose-Capillary: 116 mg/dL — ABNORMAL HIGH (ref 70–99)
Glucose-Capillary: 118 mg/dL — ABNORMAL HIGH (ref 70–99)
Glucose-Capillary: 121 mg/dL — ABNORMAL HIGH (ref 70–99)

## 2019-08-20 LAB — APTT: aPTT: 25 seconds (ref 24–36)

## 2019-08-20 LAB — RENAL FUNCTION PANEL
Albumin: 2.2 g/dL — ABNORMAL LOW (ref 3.5–5.0)
Anion gap: 14 (ref 5–15)
BUN: 86 mg/dL — ABNORMAL HIGH (ref 6–20)
CO2: 20 mmol/L — ABNORMAL LOW (ref 22–32)
Calcium: 9 mg/dL (ref 8.9–10.3)
Chloride: 116 mmol/L — ABNORMAL HIGH (ref 98–111)
Creatinine, Ser: 1.88 mg/dL — ABNORMAL HIGH (ref 0.61–1.24)
GFR calc Af Amer: 44 mL/min — ABNORMAL LOW (ref >60)
GFR calc non Af Amer: 38 mL/min — ABNORMAL LOW (ref >60)
Glucose, Bld: 128 mg/dL — ABNORMAL HIGH (ref 70–99)
Phosphorus: 4.7 mg/dL — ABNORMAL HIGH (ref 2.5–4.6)
Potassium: 5.2 mmol/L — ABNORMAL HIGH (ref 3.5–5.1)
Sodium: 150 mmol/L — ABNORMAL HIGH (ref 135–145)

## 2019-08-20 LAB — MAGNESIUM: Magnesium: 2 mg/dL (ref 1.7–2.4)

## 2019-08-20 LAB — HEMOGLOBIN AND HEMATOCRIT, BLOOD
HCT: 28.9 % — ABNORMAL LOW (ref 39.0–52.0)
Hemoglobin: 8.9 g/dL — ABNORMAL LOW (ref 13.0–17.0)

## 2019-08-20 MED ORDER — FREE WATER
300.0000 mL | Status: DC
  Administered 2019-08-20 – 2019-08-22 (×20): 300 mL

## 2019-08-20 MED ORDER — VITAMIN D 25 MCG (1000 UNIT) PO TABS
2000.0000 [IU] | ORAL_TABLET | Freq: Two times a day (BID) | ORAL | Status: DC
  Administered 2019-08-20 – 2019-09-01 (×24): 2000 [IU] via ORAL
  Filled 2019-08-20 (×24): qty 2
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
## 2019-08-20 NOTE — Progress Notes (Addendum)
Upon 2000 assessment, blanchable redness with minimal skin breakdown was noted on the left buttocks. Patient was offloaded to the right side with pillow/wedge support. Patient frequently has wet stools. The doctor has been notified and ordered a Flexiseal, which has been put it and now is draining liquid stool.
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
## 2019-08-21 LAB — GLUCOSE, CAPILLARY
Glucose-Capillary: 102 mg/dL — ABNORMAL HIGH (ref 70–99)
Glucose-Capillary: 110 mg/dL — ABNORMAL HIGH (ref 70–99)
Glucose-Capillary: 110 mg/dL — ABNORMAL HIGH (ref 70–99)
Glucose-Capillary: 120 mg/dL — ABNORMAL HIGH (ref 70–99)
Glucose-Capillary: 123 mg/dL — ABNORMAL HIGH (ref 70–99)
Glucose-Capillary: 133 mg/dL — ABNORMAL HIGH (ref 70–99)

## 2019-08-21 LAB — APTT: aPTT: 25 seconds (ref 24–36)

## 2019-08-21 LAB — RENAL FUNCTION PANEL
Albumin: 2.2 g/dL — ABNORMAL LOW (ref 3.5–5.0)
Anion gap: 13 (ref 5–15)
BUN: 78 mg/dL — ABNORMAL HIGH (ref 6–20)
CO2: 20 mmol/L — ABNORMAL LOW (ref 22–32)
Calcium: 9 mg/dL (ref 8.9–10.3)
Chloride: 112 mmol/L — ABNORMAL HIGH (ref 98–111)
Creatinine, Ser: 1.58 mg/dL — ABNORMAL HIGH (ref 0.61–1.24)
GFR calc Af Amer: 55 mL/min — ABNORMAL LOW (ref >60)
GFR calc non Af Amer: 47 mL/min — ABNORMAL LOW (ref >60)
Glucose, Bld: 126 mg/dL — ABNORMAL HIGH (ref 70–99)
Phosphorus: 4.9 mg/dL — ABNORMAL HIGH (ref 2.5–4.6)
Potassium: 4.8 mmol/L (ref 3.5–5.1)
Sodium: 145 mmol/L (ref 135–145)

## 2019-08-21 LAB — CBC
HCT: 28 % — ABNORMAL LOW (ref 39.0–52.0)
Hemoglobin: 8.8 g/dL — ABNORMAL LOW (ref 13.0–17.0)
MCH: 30.4 pg (ref 26.0–34.0)
MCHC: 31.4 g/dL (ref 30.0–36.0)
MCV: 96.9 fL (ref 80.0–100.0)
Platelets: 617 10*3/uL — ABNORMAL HIGH (ref 150–400)
RBC: 2.89 MIL/uL — ABNORMAL LOW (ref 4.22–5.81)
RDW: 15.4 % (ref 11.5–15.5)
WBC: 8.4 10*3/uL (ref 4.0–10.5)
nRBC: 0 % (ref 0.0–0.2)

## 2019-08-21 LAB — MAGNESIUM: Magnesium: 1.9 mg/dL (ref 1.7–2.4)

## 2019-08-21 NOTE — Progress Notes (Signed)
Patient expressed to me feelings of "misery" and "not wanting to live anymore" in his condition. I sat next to him and offered words of encouragement while expressing that the other day the removal of his HD catheter represented an improvement in his kidney function. I expressed to him the importance of such "little victories" on his road to recovery. Will continue to monitor patient and assess his psychosocial needs.

## 2019-08-21 NOTE — Progress Notes (Signed)
Orthopedic Tech Progress Note Patient Details:  [name] [facility] 06-30-1961 [id number]  Ortho Devices Type of Ortho Device: Prafo boot/shoe Ortho Device/Splint Location: right Ortho Device/Splint Interventions: Application   Post Interventions Patient Tolerated: Well Instructions Provided: Care of device   [name] 08/21/2019, 1:57 PM
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
## 2019-08-21 NOTE — Progress Notes (Signed)
Patient brother called, password given, updated on patient's condition and mood. Updated on CIR status. Questions answered. Patient's brother thanks nurse and says that his other brother may call for an update in the morning.

## 2019-08-22 ENCOUNTER — Inpatient Hospital Stay (HOSPITAL_COMMUNITY): Payer: 59

## 2019-08-22 DIAGNOSIS — L8990 Pressure ulcer of unspecified site, unspecified stage: Secondary | ICD-10-CM | POA: Insufficient documentation

## 2019-08-22 LAB — GLUCOSE, CAPILLARY
Glucose-Capillary: 100 mg/dL — ABNORMAL HIGH (ref 70–99)
Glucose-Capillary: 106 mg/dL — ABNORMAL HIGH (ref 70–99)
Glucose-Capillary: 116 mg/dL — ABNORMAL HIGH (ref 70–99)
Glucose-Capillary: 119 mg/dL — ABNORMAL HIGH (ref 70–99)
Glucose-Capillary: 120 mg/dL — ABNORMAL HIGH (ref 70–99)
Glucose-Capillary: 123 mg/dL — ABNORMAL HIGH (ref 70–99)
Glucose-Capillary: 96 mg/dL (ref 70–99)

## 2019-08-22 LAB — BASIC METABOLIC PANEL
Anion gap: 12 (ref 5–15)
BUN: 68 mg/dL — ABNORMAL HIGH (ref 6–20)
CO2: 20 mmol/L — ABNORMAL LOW (ref 22–32)
Calcium: 8.9 mg/dL (ref 8.9–10.3)
Chloride: 108 mmol/L (ref 98–111)
Creatinine, Ser: 1.47 mg/dL — ABNORMAL HIGH (ref 0.61–1.24)
GFR calc Af Amer: 60 mL/min — ABNORMAL LOW (ref >60)
GFR calc non Af Amer: 51 mL/min — ABNORMAL LOW (ref >60)
Glucose, Bld: 120 mg/dL — ABNORMAL HIGH (ref 70–99)
Potassium: 4.6 mmol/L (ref 3.5–5.1)
Sodium: 140 mmol/L (ref 135–145)

## 2019-08-22 LAB — CBC
HCT: 27.9 % — ABNORMAL LOW (ref 39.0–52.0)
Hemoglobin: 8.6 g/dL — ABNORMAL LOW (ref 13.0–17.0)
MCH: 29.5 pg (ref 26.0–34.0)
MCHC: 30.8 g/dL (ref 30.0–36.0)
MCV: 95.5 fL (ref 80.0–100.0)
Platelets: 622 10*3/uL — ABNORMAL HIGH (ref 150–400)
RBC: 2.92 MIL/uL — ABNORMAL LOW (ref 4.22–5.81)
RDW: 15 % (ref 11.5–15.5)
WBC: 10.8 10*3/uL — ABNORMAL HIGH (ref 4.0–10.5)
nRBC: 0 % (ref 0.0–0.2)

## 2019-08-22 LAB — RENAL FUNCTION PANEL
Albumin: 2.1 g/dL — ABNORMAL LOW (ref 3.5–5.0)
Anion gap: 12 (ref 5–15)
BUN: 67 mg/dL — ABNORMAL HIGH (ref 6–20)
CO2: 20 mmol/L — ABNORMAL LOW (ref 22–32)
Calcium: 8.9 mg/dL (ref 8.9–10.3)
Chloride: 109 mmol/L (ref 98–111)
Creatinine, Ser: 1.5 mg/dL — ABNORMAL HIGH (ref 0.61–1.24)
GFR calc Af Amer: 58 mL/min — ABNORMAL LOW (ref >60)
GFR calc non Af Amer: 50 mL/min — ABNORMAL LOW (ref >60)
Glucose, Bld: 119 mg/dL — ABNORMAL HIGH (ref 70–99)
Phosphorus: 4.5 mg/dL (ref 2.5–4.6)
Potassium: 4.7 mmol/L (ref 3.5–5.1)
Sodium: 141 mmol/L (ref 135–145)

## 2019-08-22 LAB — MAGNESIUM: Magnesium: 1.7 mg/dL (ref 1.7–2.4)

## 2019-08-22 LAB — APTT: aPTT: 25 seconds (ref 24–36)

## 2019-08-22 MED ORDER — METOPROLOL TARTRATE 5 MG/5ML IV SOLN
5.0000 mg | Freq: Four times a day (QID) | INTRAVENOUS | Status: DC
  Administered 2019-08-22 – 2019-08-25 (×11): 5 mg via INTRAVENOUS
  Filled 2019-08-22 (×10): qty 5

## 2019-08-22 MED ORDER — SODIUM CHLORIDE 0.9 % IV SOLN
INTRAVENOUS | Status: AC
  Administered 2019-08-22: 21:00:00 via INTRAVENOUS

## 2019-08-22 MED ORDER — METHOCARBAMOL 1000 MG/10ML IJ SOLN
500.0000 mg | Freq: Three times a day (TID) | INTRAVENOUS | Status: DC | PRN
  Filled 2019-08-22: qty 5

## 2019-08-22 MED ORDER — PIPERACILLIN-TAZOBACTAM 3.375 G IVPB
3.3750 g | Freq: Three times a day (TID) | INTRAVENOUS | Status: DC
  Administered 2019-08-22 – 2019-09-01 (×29): 3.375 g via INTRAVENOUS
  Filled 2019-08-22 (×33): qty 50

## 2019-08-22 NOTE — Progress Notes (Signed)
Patient ID: SKLYER BAGE, male   DOB: 1961-09-22, 58 y.o.   MRN: [id number] Ct reviewed and discussed with radiology.  Concern for possible contrast leak although nothiing identified. I think clinically he may be decompressing infection and possibly leak? Via his rectal stump.  I dont think needs reoperation. There is nothing to drain right now.  Will stop feeds and po meds, start on iv fluids and then reassess.  May need to go on tpn tomorrow and then repeat ct in next several days
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
## 2019-08-23 ENCOUNTER — Inpatient Hospital Stay: Payer: Self-pay

## 2019-08-23 LAB — BPAM RBC
Blood Product Expiration Date: 202012102359
Blood Product Expiration Date: 202012102359
ISSUE DATE / TIME: 202011121009
ISSUE DATE / TIME: 202011122259
Unit Type and Rh: 6200
Unit Type and Rh: 6200

## 2019-08-23 LAB — CBC
HCT: 24.7 % — ABNORMAL LOW (ref 39.0–52.0)
Hemoglobin: 7.7 g/dL — ABNORMAL LOW (ref 13.0–17.0)
MCH: 30 pg (ref 26.0–34.0)
MCHC: 31.2 g/dL (ref 30.0–36.0)
MCV: 96.1 fL (ref 80.0–100.0)
Platelets: 557 10*3/uL — ABNORMAL HIGH (ref 150–400)
RBC: 2.57 MIL/uL — ABNORMAL LOW (ref 4.22–5.81)
RDW: 14.6 % (ref 11.5–15.5)
WBC: 13.5 10*3/uL — ABNORMAL HIGH (ref 4.0–10.5)
nRBC: 0 % (ref 0.0–0.2)

## 2019-08-23 LAB — RENAL FUNCTION PANEL
Albumin: 1.9 g/dL — ABNORMAL LOW (ref 3.5–5.0)
Anion gap: 10 (ref 5–15)
BUN: 49 mg/dL — ABNORMAL HIGH (ref 6–20)
CO2: 21 mmol/L — ABNORMAL LOW (ref 22–32)
Calcium: 8.3 mg/dL — ABNORMAL LOW (ref 8.9–10.3)
Chloride: 105 mmol/L (ref 98–111)
Creatinine, Ser: 1.36 mg/dL — ABNORMAL HIGH (ref 0.61–1.24)
GFR calc Af Amer: >60 mL/min (ref >60)
GFR calc non Af Amer: 57 mL/min — ABNORMAL LOW (ref >60)
Glucose, Bld: 122 mg/dL — ABNORMAL HIGH (ref 70–99)
Phosphorus: 4 mg/dL (ref 2.5–4.6)
Potassium: 4.3 mmol/L (ref 3.5–5.1)
Sodium: 136 mmol/L (ref 135–145)

## 2019-08-23 LAB — GLUCOSE, CAPILLARY
Glucose-Capillary: 115 mg/dL — ABNORMAL HIGH (ref 70–99)
Glucose-Capillary: 100 mg/dL — ABNORMAL HIGH (ref 70–99)
Glucose-Capillary: 102 mg/dL — ABNORMAL HIGH (ref 70–99)
Glucose-Capillary: 96 mg/dL (ref 70–99)
Glucose-Capillary: 110 mg/dL — ABNORMAL HIGH (ref 70–99)
Glucose-Capillary: 114 mg/dL — ABNORMAL HIGH (ref 70–99)

## 2019-08-23 LAB — TYPE AND SCREEN
ABO/RH(D): A POS
Antibody Screen: NEGATIVE
Unit division: 0
Unit division: 0

## 2019-08-23 LAB — APTT: aPTT: 27 seconds (ref 24–36)

## 2019-08-23 LAB — MAGNESIUM: Magnesium: 1.6 mg/dL — ABNORMAL LOW (ref 1.7–2.4)

## 2019-08-23 MED ORDER — SODIUM CHLORIDE 0.9 % IV SOLN: INTRAVENOUS | Status: AC

## 2019-08-23 MED ORDER — MAGNESIUM SULFATE 2 GM/50ML IV SOLN
2.0000 g | Freq: Once | INTRAVENOUS | Status: AC
  Administered 2019-08-23: 2 g via INTRAVENOUS
  Filled 2019-08-23: qty 50

## 2019-08-23 MED ORDER — SODIUM CHLORIDE 0.9% FLUSH
10.0000 mL | Freq: Two times a day (BID) | INTRAVENOUS | Status: DC
  Administered 2019-08-23 – 2019-08-24 (×2): 10 mL

## 2019-08-23 MED ORDER — TRAVASOL 10 % IV SOLN
INTRAVENOUS | Status: AC
  Administered 2019-08-23: 18:00:00 via INTRAVENOUS
  Filled 2019-08-23: qty 842.4

## 2019-08-23 MED ORDER — SODIUM CHLORIDE 0.9% FLUSH: 10.0000 mL | INTRAVENOUS | Status: DC | PRN

## 2019-08-23 NOTE — Progress Notes (Signed)
PT Cancellation Note  Patient Details Name: [name] MRN: [id number] DOB: 1961-03-15   Cancelled Treatment:    Reason Eval/Treat Not Completed: Patient declined, no reason specified.  Pt hurting in his groin and generally not willing to do anything today. 08/23/2019  Donnella Sham, [place] [phone]  (pager) [phone]  (office)   Tessie Fass Solae Norling 08/23/2019, 4:34 PM
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
## 2019-08-23 NOTE — Progress Notes (Signed)
PICC line started by IV team and TPN started at 1800. Air mattress bed ordered, but will be tomorrow before it will arrives. Pt co pain in left scrotal/groin area. 12/10. Provider notified and came to assess. Walking boot to be alternated between both feet for foot drop prevention. Spoke with brother to update and patient spoke with brother as well. Simmie Davies RN
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
## 2019-08-23 NOTE — Progress Notes (Signed)
Chaplain visited with patient after a referral from the nurse.  The patient showed immense grief toward his health condition.  He is struggling with the long-term ramifications of his health and how that will impact his wife and family.  The chaplain provided prayer and listened to his concerns.  The chaplain plans to follow-up before the end of the week.  Brion Aliment Chaplain Resident For questions concerning this note please contact me by pager [phone]
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
## 2019-08-23 NOTE — Progress Notes (Signed)
Peripherally Inserted Central Catheter/Midline Placement  The IV Nurse has discussed with the patient and/or persons authorized to consent for the patient, the purpose of this procedure and the potential benefits and risks involved with this procedure.  The benefits include less needle sticks, lab draws from the catheter, and the patient may be discharged home with the catheter. Risks include, but not limited to, infection, bleeding, blood clot (thrombus formation), and puncture of an artery; nerve damage and irregular heartbeat and possibility to perform a PICC exchange if needed/ordered by physician.  Alternatives to this procedure were also discussed.  Bard Power PICC patient education guide, fact sheet on infection prevention and patient information card has been provided to patient /or left at bedside.    PICC/Midline Placement Documentation  PICC Double Lumen 08/23/19 PICC Right 39 cm 0 cm (Active)  Indication for Insertion or Continuance of Line Administration of hyperosmolar/irritating solutions (i.e. TPN, Vancomycin, etc.) 08/23/19 1500  Exposed Catheter (cm) 0 cm 08/23/19 1500  Site Assessment Clean;Dry;Intact 08/23/19 1500  Lumen #1 Status Flushed;Blood return noted 08/23/19 1500  Lumen #2 Status Flushed;Blood return noted 08/23/19 1500  Dressing Type Transparent 08/23/19 1500  Dressing Status Clean;Dry;Intact;Antimicrobial disc in place 08/23/19 1500  Dressing Change Due 08/30/19 08/23/19 1500       Jule Economy Horton 08/23/2019, 3:23 PM

## 2019-08-23 NOTE — Progress Notes (Signed)
Was paged by the nurse that after she moved the pt he complained of severe scrotal pain.  When I went to see patient his pain has improved.  No associated symptoms.  On exam: No swelling, erythema or TTP to scrotum or testicles.  No lesions noted.  Mild TTP of perineum posterior to scrotum.  Will monitor.  Jackson Latino, [facility] Surgery Pager [phone]

## 2019-08-23 NOTE — Progress Notes (Signed)
Inpatient Rehabilitation Admissions Coordinator  I continue to follow pt's progress to assist with CIR admit once medically ready.  Danne Baxter, RN, MSN Rehab Admissions Coordinator [phone] 08/23/2019 3:17 PM

## 2019-08-23 NOTE — Progress Notes (Signed)
SLP Cancellation Note  Patient Details Name: [name] MRN: [id number] DOB: September 04, 1961   Cancelled treatment:       Arrived to see pt however pt having sterile procedure in room. Will continue efforts.                                                                                                 Damarys Speir 08/23/2019, 3:17 PM
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
## 2019-08-23 NOTE — Consult Note (Signed)
[place] [facility] Nurse ostomy follow up Stoma type/location: RUQ ileostomy Stomal assessment/size: 1" pink and round Peristomal assessment: hair is trimmed today with scissors.  Some discomfort with pouch removal.   Treatment options for stomal/peristomal skin: Barrier ring and high output pouch.  Connected to bedside drainage.  Output  Liquid yellow stool Ostomy pouching: 2pc. 2 3/4" high output pouch  Education provided: Patient is alert.  Discussed overall ostomy care with patient.  That we will be teaching him and his wife to care for his ostomy.  HE is appreciative.   Enrolled patient in [place] Start Discharge program: No WOC team will follow.  Domenic Moras MSN, RN, FNP-BC CWON Wound, Ostomy, Continence Nurse Pager [phone]

## 2019-08-24 LAB — COMPREHENSIVE METABOLIC PANEL
ALT: 46 U/L — ABNORMAL HIGH (ref 0–44)
AST: 31 U/L (ref 15–41)
Albumin: 2.1 g/dL — ABNORMAL LOW (ref 3.5–5.0)
Alkaline Phosphatase: 145 U/L — ABNORMAL HIGH (ref 38–126)
Anion gap: 11 (ref 5–15)
BUN: 38 mg/dL — ABNORMAL HIGH (ref 6–20)
CO2: 21 mmol/L — ABNORMAL LOW (ref 22–32)
Calcium: 8.5 mg/dL — ABNORMAL LOW (ref 8.9–10.3)
Chloride: 106 mmol/L (ref 98–111)
Creatinine, Ser: 1.1 mg/dL (ref 0.61–1.24)
GFR calc Af Amer: >60 mL/min (ref >60)
GFR calc non Af Amer: >60 mL/min (ref >60)
Glucose, Bld: 105 mg/dL — ABNORMAL HIGH (ref 70–99)
Potassium: 4 mmol/L (ref 3.5–5.1)
Sodium: 138 mmol/L (ref 135–145)
Total Bilirubin: 0.7 mg/dL (ref 0.3–1.2)
Total Protein: 6.6 g/dL (ref 6.5–8.1)

## 2019-08-24 LAB — DIFFERENTIAL
Abs Immature Granulocytes: 0.29 10*3/uL — ABNORMAL HIGH (ref 0.00–0.07)
Basophils Absolute: 0.1 10*3/uL (ref 0.0–0.1)
Basophils Relative: 1 %
Eosinophils Absolute: 0.4 10*3/uL (ref 0.0–0.5)
Eosinophils Relative: 3 %
Immature Granulocytes: 2 %
Lymphocytes Relative: 14 %
Lymphs Abs: 2 10*3/uL (ref 0.7–4.0)
Monocytes Absolute: 1 10*3/uL (ref 0.1–1.0)
Monocytes Relative: 7 %
Neutro Abs: 10.4 10*3/uL — ABNORMAL HIGH (ref 1.7–7.7)
Neutrophils Relative %: 73 %

## 2019-08-24 LAB — GLUCOSE, CAPILLARY
Glucose-Capillary: 110 mg/dL — ABNORMAL HIGH (ref 70–99)
Glucose-Capillary: 117 mg/dL — ABNORMAL HIGH (ref 70–99)
Glucose-Capillary: 123 mg/dL — ABNORMAL HIGH (ref 70–99)
Glucose-Capillary: 124 mg/dL — ABNORMAL HIGH (ref 70–99)
Glucose-Capillary: 126 mg/dL — ABNORMAL HIGH (ref 70–99)
Glucose-Capillary: 75 mg/dL (ref 70–99)

## 2019-08-24 LAB — PREALBUMIN: Prealbumin: 30.8 mg/dL (ref 18–38)

## 2019-08-24 LAB — CBC
HCT: 24.6 % — ABNORMAL LOW (ref 39.0–52.0)
Hemoglobin: 7.8 g/dL — ABNORMAL LOW (ref 13.0–17.0)
MCH: 30 pg (ref 26.0–34.0)
MCHC: 31.7 g/dL (ref 30.0–36.0)
MCV: 94.6 fL (ref 80.0–100.0)
Platelets: 576 10*3/uL — ABNORMAL HIGH (ref 150–400)
RBC: 2.6 MIL/uL — ABNORMAL LOW (ref 4.22–5.81)
RDW: 14.5 % (ref 11.5–15.5)
WBC: 14.2 10*3/uL — ABNORMAL HIGH (ref 4.0–10.5)
nRBC: 0 % (ref 0.0–0.2)

## 2019-08-24 LAB — MAGNESIUM: Magnesium: 1.8 mg/dL (ref 1.7–2.4)

## 2019-08-24 LAB — APTT: aPTT: 27 seconds (ref 24–36)

## 2019-08-24 LAB — TRIGLYCERIDES: Triglycerides: 168 mg/dL — ABNORMAL HIGH (ref <150)

## 2019-08-24 LAB — PHOSPHORUS: Phosphorus: 4.1 mg/dL (ref 2.5–4.6)

## 2019-08-24 MED ORDER — SODIUM CHLORIDE 0.9 % IV SOLN: INTRAVENOUS | Status: DC

## 2019-08-24 MED ORDER — TRAVASOL 10 % IV SOLN
INTRAVENOUS | Status: AC
  Administered 2019-08-24: 18:00:00 via INTRAVENOUS
  Filled 2019-08-24: qty 1404
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
## 2019-08-25 ENCOUNTER — Inpatient Hospital Stay (HOSPITAL_COMMUNITY): Payer: 59

## 2019-08-25 LAB — CBC
HCT: 24.2 % — ABNORMAL LOW (ref 39.0–52.0)
Hemoglobin: 7.6 g/dL — ABNORMAL LOW (ref 13.0–17.0)
MCH: 30.4 pg (ref 26.0–34.0)
MCHC: 31.4 g/dL (ref 30.0–36.0)
MCV: 96.8 fL (ref 80.0–100.0)
Platelets: 559 10*3/uL — ABNORMAL HIGH (ref 150–400)
RBC: 2.5 MIL/uL — ABNORMAL LOW (ref 4.22–5.81)
RDW: 15.1 % (ref 11.5–15.5)
WBC: 14.4 10*3/uL — ABNORMAL HIGH (ref 4.0–10.5)
nRBC: 0 % (ref 0.0–0.2)

## 2019-08-25 LAB — GLUCOSE, CAPILLARY
Glucose-Capillary: 122 mg/dL — ABNORMAL HIGH (ref 70–99)
Glucose-Capillary: 122 mg/dL — ABNORMAL HIGH (ref 70–99)
Glucose-Capillary: 121 mg/dL — ABNORMAL HIGH (ref 70–99)
Glucose-Capillary: 118 mg/dL — ABNORMAL HIGH (ref 70–99)
Glucose-Capillary: 128 mg/dL — ABNORMAL HIGH (ref 70–99)

## 2019-08-25 LAB — RENAL FUNCTION PANEL
Albumin: 2 g/dL — ABNORMAL LOW (ref 3.5–5.0)
Anion gap: 8 (ref 5–15)
BUN: 39 mg/dL — ABNORMAL HIGH (ref 6–20)
CO2: 21 mmol/L — ABNORMAL LOW (ref 22–32)
Calcium: 8.5 mg/dL — ABNORMAL LOW (ref 8.9–10.3)
Chloride: 109 mmol/L (ref 98–111)
Creatinine, Ser: 1.09 mg/dL (ref 0.61–1.24)
GFR calc Af Amer: >60 mL/min (ref >60)
GFR calc non Af Amer: >60 mL/min (ref >60)
Glucose, Bld: 111 mg/dL — ABNORMAL HIGH (ref 70–99)
Phosphorus: 3 mg/dL (ref 2.5–4.6)
Potassium: 3.6 mmol/L (ref 3.5–5.1)
Sodium: 138 mmol/L (ref 135–145)

## 2019-08-25 LAB — APTT: aPTT: 26 seconds (ref 24–36)

## 2019-08-25 LAB — MAGNESIUM: Magnesium: 1.8 mg/dL (ref 1.7–2.4)

## 2019-08-25 MED ORDER — QUETIAPINE FUMARATE 50 MG PO TABS
25.0000 mg | ORAL_TABLET | Freq: Every day | ORAL | Status: DC
  Administered 2019-08-25 – 2019-08-31 (×7): 25 mg via ORAL
  Filled 2019-08-25 (×7): qty 1

## 2019-08-25 MED ORDER — ACETAMINOPHEN 160 MG/5ML PO SOLN: 500.0000 mg | Freq: Four times a day (QID) | ORAL | Status: DC

## 2019-08-25 MED ORDER — ACETAMINOPHEN 160 MG/5ML PO SOLN
500.0000 mg | ORAL | Status: DC | PRN
  Administered 2019-08-29 – 2019-09-01 (×4): 500 mg via ORAL
  Filled 2019-08-25 (×5): qty 20.3

## 2019-08-25 MED ORDER — TRAVASOL 10 % IV SOLN
INTRAVENOUS | Status: AC
  Administered 2019-08-25: 17:00:00 via INTRAVENOUS
  Filled 2019-08-25: qty 1404

## 2019-08-25 MED ORDER — LORAZEPAM 2 MG/ML IJ SOLN
0.5000 mg | Freq: Two times a day (BID) | INTRAMUSCULAR | Status: DC | PRN
  Administered 2019-08-25 – 2019-08-29 (×5): 0.5 mg via INTRAVENOUS
  Filled 2019-08-25 (×5): qty 1

## 2019-08-25 MED ORDER — POTASSIUM CHLORIDE 10 MEQ/100ML IV SOLN
10.0000 meq | Freq: Once | INTRAVENOUS | Status: AC
  Administered 2019-08-25: 10 meq via INTRAVENOUS
  Filled 2019-08-25: qty 100

## 2019-08-25 MED ORDER — MAGNESIUM SULFATE 2 GM/50ML IV SOLN
2.0000 g | Freq: Once | INTRAVENOUS | Status: AC
  Administered 2019-08-25: 2 g via INTRAVENOUS
  Filled 2019-08-25: qty 50

## 2019-08-25 MED ORDER — TRAMADOL HCL 50 MG PO TABS
50.0000 mg | ORAL_TABLET | Freq: Four times a day (QID) | ORAL | Status: DC | PRN
  Administered 2019-08-25 – 2019-08-26 (×2): 100 mg via ORAL
  Administered 2019-08-27: 50 mg via ORAL
  Administered 2019-08-28 – 2019-08-31 (×8): 100 mg via ORAL
  Filled 2019-08-25 (×3): qty 2
  Filled 2019-08-25: qty 1
  Filled 2019-08-25 (×7): qty 2

## 2019-08-25 MED ORDER — HEPARIN SODIUM (PORCINE) 5000 UNIT/ML IJ SOLN
5000.0000 [IU] | Freq: Three times a day (TID) | INTRAMUSCULAR | Status: DC
  Administered 2019-08-25 – 2019-09-01 (×22): 5000 [IU] via SUBCUTANEOUS
  Filled 2019-08-25 (×23): qty 1

## 2019-08-25 MED ORDER — TRAVASOL 10 % IV SOLN: INTRAVENOUS | Status: DC

## 2019-08-25 MED ORDER — POTASSIUM CHLORIDE 10 MEQ/100ML IV SOLN
10.0000 meq | INTRAVENOUS | Status: AC
  Administered 2019-08-25 (×3): 10 meq via INTRAVENOUS
  Filled 2019-08-25 (×3): qty 100

## 2019-08-25 MED ORDER — PROPRANOLOL HCL 20 MG PO TABS
20.0000 mg | ORAL_TABLET | Freq: Three times a day (TID) | ORAL | Status: DC
  Administered 2019-08-25 – 2019-09-01 (×22): 20 mg via ORAL
  Filled 2019-08-25 (×24): qty 1
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
## 2019-08-26 LAB — CBC
HCT: 24.3 % — ABNORMAL LOW (ref 39.0–52.0)
Hemoglobin: 7.7 g/dL — ABNORMAL LOW (ref 13.0–17.0)
MCH: 31.2 pg (ref 26.0–34.0)
MCHC: 31.7 g/dL (ref 30.0–36.0)
MCV: 98.4 fL (ref 80.0–100.0)
Platelets: 574 10*3/uL — ABNORMAL HIGH (ref 150–400)
RBC: 2.47 MIL/uL — ABNORMAL LOW (ref 4.22–5.81)
RDW: 15.2 % (ref 11.5–15.5)
WBC: 15.7 10*3/uL — ABNORMAL HIGH (ref 4.0–10.5)
nRBC: 0 % (ref 0.0–0.2)

## 2019-08-26 LAB — COMPREHENSIVE METABOLIC PANEL
ALT: 58 U/L — ABNORMAL HIGH (ref 0–44)
AST: 44 U/L — ABNORMAL HIGH (ref 15–41)
Albumin: 2.1 g/dL — ABNORMAL LOW (ref 3.5–5.0)
Alkaline Phosphatase: 150 U/L — ABNORMAL HIGH (ref 38–126)
Anion gap: 8 (ref 5–15)
BUN: 39 mg/dL — ABNORMAL HIGH (ref 6–20)
CO2: 22 mmol/L (ref 22–32)
Calcium: 8.4 mg/dL — ABNORMAL LOW (ref 8.9–10.3)
Chloride: 109 mmol/L (ref 98–111)
Creatinine, Ser: 1.1 mg/dL (ref 0.61–1.24)
GFR calc Af Amer: >60 mL/min (ref >60)
GFR calc non Af Amer: >60 mL/min (ref >60)
Glucose, Bld: 129 mg/dL — ABNORMAL HIGH (ref 70–99)
Potassium: 3.9 mmol/L (ref 3.5–5.1)
Sodium: 139 mmol/L (ref 135–145)
Total Bilirubin: 0.4 mg/dL (ref 0.3–1.2)
Total Protein: 6.4 g/dL — ABNORMAL LOW (ref 6.5–8.1)

## 2019-08-26 LAB — GLUCOSE, CAPILLARY
Glucose-Capillary: 106 mg/dL — ABNORMAL HIGH (ref 70–99)
Glucose-Capillary: 108 mg/dL — ABNORMAL HIGH (ref 70–99)
Glucose-Capillary: 109 mg/dL — ABNORMAL HIGH (ref 70–99)
Glucose-Capillary: 113 mg/dL — ABNORMAL HIGH (ref 70–99)
Glucose-Capillary: 113 mg/dL — ABNORMAL HIGH (ref 70–99)
Glucose-Capillary: 120 mg/dL — ABNORMAL HIGH (ref 70–99)
Glucose-Capillary: 120 mg/dL — ABNORMAL HIGH (ref 70–99)

## 2019-08-26 LAB — APTT: aPTT: 27 seconds (ref 24–36)

## 2019-08-26 LAB — PHOSPHORUS: Phosphorus: 3 mg/dL (ref 2.5–4.6)

## 2019-08-26 LAB — MAGNESIUM: Magnesium: 2.1 mg/dL (ref 1.7–2.4)

## 2019-08-26 MED ORDER — TRAVASOL 10 % IV SOLN
INTRAVENOUS | Status: AC
  Administered 2019-08-26: 17:00:00 via INTRAVENOUS
  Filled 2019-08-26: qty 1404

## 2019-08-26 NOTE — Progress Notes (Signed)
MRI scan left knee shows degenerative, complex type medial meniscal tear with no displaced fragments.  Also some bone bruising.  Ligaments ACL PCL and collaterals intact.  I would favor some rehab and CPM for that left knee.  May need to do some arthroscopy and meniscal debridement in 4 to 6 weeks but at this time it is likely not indicated or advisable.
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
## 2019-08-27 LAB — CBC
HCT: 24.8 % — ABNORMAL LOW (ref 39.0–52.0)
Hemoglobin: 7.5 g/dL — ABNORMAL LOW (ref 13.0–17.0)
MCH: 30.5 pg (ref 26.0–34.0)
MCHC: 30.2 g/dL (ref 30.0–36.0)
MCV: 100.8 fL — ABNORMAL HIGH (ref 80.0–100.0)
Platelets: 590 10*3/uL — ABNORMAL HIGH (ref 150–400)
RBC: 2.46 MIL/uL — ABNORMAL LOW (ref 4.22–5.81)
RDW: 15.9 % — ABNORMAL HIGH (ref 11.5–15.5)
WBC: 15.8 10*3/uL — ABNORMAL HIGH (ref 4.0–10.5)
nRBC: 0 % (ref 0.0–0.2)

## 2019-08-27 LAB — RENAL FUNCTION PANEL
Albumin: 2.1 g/dL — ABNORMAL LOW (ref 3.5–5.0)
Anion gap: 9 (ref 5–15)
BUN: 41 mg/dL — ABNORMAL HIGH (ref 6–20)
CO2: 21 mmol/L — ABNORMAL LOW (ref 22–32)
Calcium: 8.6 mg/dL — ABNORMAL LOW (ref 8.9–10.3)
Chloride: 109 mmol/L (ref 98–111)
Creatinine, Ser: 1.11 mg/dL (ref 0.61–1.24)
GFR calc Af Amer: >60 mL/min (ref >60)
GFR calc non Af Amer: >60 mL/min (ref >60)
Glucose, Bld: 120 mg/dL — ABNORMAL HIGH (ref 70–99)
Phosphorus: 3.3 mg/dL (ref 2.5–4.6)
Potassium: 3.8 mmol/L (ref 3.5–5.1)
Sodium: 139 mmol/L (ref 135–145)

## 2019-08-27 LAB — APTT: aPTT: 43 seconds — ABNORMAL HIGH (ref 24–36)

## 2019-08-27 LAB — GLUCOSE, CAPILLARY
Glucose-Capillary: 114 mg/dL — ABNORMAL HIGH (ref 70–99)
Glucose-Capillary: 124 mg/dL — ABNORMAL HIGH (ref 70–99)
Glucose-Capillary: 115 mg/dL — ABNORMAL HIGH (ref 70–99)
Glucose-Capillary: 102 mg/dL — ABNORMAL HIGH (ref 70–99)
Glucose-Capillary: 105 mg/dL — ABNORMAL HIGH (ref 70–99)

## 2019-08-27 LAB — MAGNESIUM: Magnesium: 2.1 mg/dL (ref 1.7–2.4)

## 2019-08-27 MED ORDER — TRAVASOL 10 % IV SOLN
INTRAVENOUS | Status: AC
  Administered 2019-08-27: 18:00:00 via INTRAVENOUS
  Filled 2019-08-27: qty 1404

## 2019-08-27 NOTE — Progress Notes (Signed)
Inpatient Rehabilitation Admissions Coordinator  I continue to follow pt's progress for medical work up completion. I will see pt on Monday.  Danne Baxter, RN, MSN Rehab Admissions Coordinator [phone] 08/27/2019 8:03 AM
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
## 2019-08-27 NOTE — Progress Notes (Signed)
PT Cancellation Note  Patient Details Name: [name] MRN: [id number] DOB: Oct 22, 1960   Cancelled Treatment:    Reason Eval/Treat Not Completed: Patient declined, no reason specified.  Pt doesn't want to do anything else until his knees are evaluated fully. 08/27/2019  Donnella Sham, [place] [phone]  (pager) [phone]  (office)   [name] Record 08/27/2019, 5:23 PM
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
## 2019-08-28 LAB — GLUCOSE, CAPILLARY
Glucose-Capillary: 101 mg/dL — ABNORMAL HIGH (ref 70–99)
Glucose-Capillary: 108 mg/dL — ABNORMAL HIGH (ref 70–99)
Glucose-Capillary: 111 mg/dL — ABNORMAL HIGH (ref 70–99)
Glucose-Capillary: 115 mg/dL — ABNORMAL HIGH (ref 70–99)
Glucose-Capillary: 122 mg/dL — ABNORMAL HIGH (ref 70–99)
Glucose-Capillary: 124 mg/dL — ABNORMAL HIGH (ref 70–99)
Glucose-Capillary: 124 mg/dL — ABNORMAL HIGH (ref 70–99)

## 2019-08-28 LAB — RENAL FUNCTION PANEL
Albumin: 2.1 g/dL — ABNORMAL LOW (ref 3.5–5.0)
Anion gap: 10 (ref 5–15)
BUN: 37 mg/dL — ABNORMAL HIGH (ref 6–20)
CO2: 23 mmol/L (ref 22–32)
Calcium: 8.9 mg/dL (ref 8.9–10.3)
Chloride: 108 mmol/L (ref 98–111)
Creatinine, Ser: 0.91 mg/dL (ref 0.61–1.24)
GFR calc Af Amer: >60 mL/min (ref >60)
GFR calc non Af Amer: >60 mL/min (ref >60)
Glucose, Bld: 115 mg/dL — ABNORMAL HIGH (ref 70–99)
Phosphorus: 3.2 mg/dL (ref 2.5–4.6)
Potassium: 3.9 mmol/L (ref 3.5–5.1)
Sodium: 141 mmol/L (ref 135–145)

## 2019-08-28 LAB — CBC
HCT: 25.1 % — ABNORMAL LOW (ref 39.0–52.0)
Hemoglobin: 7.7 g/dL — ABNORMAL LOW (ref 13.0–17.0)
MCH: 29.8 pg (ref 26.0–34.0)
MCHC: 30.7 g/dL (ref 30.0–36.0)
MCV: 97.3 fL (ref 80.0–100.0)
Platelets: 592 10*3/uL — ABNORMAL HIGH (ref 150–400)
RBC: 2.58 MIL/uL — ABNORMAL LOW (ref 4.22–5.81)
RDW: 16.2 % — ABNORMAL HIGH (ref 11.5–15.5)
WBC: 14.1 10*3/uL — ABNORMAL HIGH (ref 4.0–10.5)
nRBC: 0 % (ref 0.0–0.2)

## 2019-08-28 LAB — APTT: aPTT: 30 seconds (ref 24–36)

## 2019-08-28 LAB — MAGNESIUM: Magnesium: 1.9 mg/dL (ref 1.7–2.4)

## 2019-08-28 MED ORDER — TRAVASOL 10 % IV SOLN
INTRAVENOUS | Status: AC
  Administered 2019-08-28: 18:00:00 via INTRAVENOUS
  Filled 2019-08-28: qty 1404
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
## 2019-08-29 ENCOUNTER — Inpatient Hospital Stay (HOSPITAL_COMMUNITY): Payer: 59

## 2019-08-29 ENCOUNTER — Encounter (HOSPITAL_COMMUNITY): Payer: Self-pay | Admitting: Radiology

## 2019-08-29 LAB — GLUCOSE, CAPILLARY
Glucose-Capillary: 90 mg/dL (ref 70–99)
Glucose-Capillary: 104 mg/dL — ABNORMAL HIGH (ref 70–99)
Glucose-Capillary: 116 mg/dL — ABNORMAL HIGH (ref 70–99)
Glucose-Capillary: 104 mg/dL — ABNORMAL HIGH (ref 70–99)
Glucose-Capillary: 122 mg/dL — ABNORMAL HIGH (ref 70–99)
Glucose-Capillary: 113 mg/dL — ABNORMAL HIGH (ref 70–99)

## 2019-08-29 LAB — COMPREHENSIVE METABOLIC PANEL
ALT: 49 U/L — ABNORMAL HIGH (ref 0–44)
AST: 33 U/L (ref 15–41)
Albumin: 2.2 g/dL — ABNORMAL LOW (ref 3.5–5.0)
Alkaline Phosphatase: 152 U/L — ABNORMAL HIGH (ref 38–126)
Anion gap: 8 (ref 5–15)
BUN: 36 mg/dL — ABNORMAL HIGH (ref 6–20)
CO2: 23 mmol/L (ref 22–32)
Calcium: 8.9 mg/dL (ref 8.9–10.3)
Chloride: 107 mmol/L (ref 98–111)
Creatinine, Ser: 0.96 mg/dL (ref 0.61–1.24)
GFR calc Af Amer: >60 mL/min (ref >60)
GFR calc non Af Amer: >60 mL/min (ref >60)
Glucose, Bld: 106 mg/dL — ABNORMAL HIGH (ref 70–99)
Potassium: 4.3 mmol/L (ref 3.5–5.1)
Sodium: 138 mmol/L (ref 135–145)
Total Bilirubin: 0.3 mg/dL (ref 0.3–1.2)
Total Protein: 6.6 g/dL (ref 6.5–8.1)

## 2019-08-29 LAB — PHOSPHORUS: Phosphorus: 3.4 mg/dL (ref 2.5–4.6)

## 2019-08-29 LAB — MAGNESIUM: Magnesium: 1.9 mg/dL (ref 1.7–2.4)

## 2019-08-29 LAB — APTT: aPTT: 31 seconds (ref 24–36)

## 2019-08-29 MED ORDER — IOHEXOL 350 MG/ML SOLN
100.0000 mL | Freq: Once | INTRAVENOUS | Status: AC | PRN
  Administered 2019-08-29: 100 mL via INTRAVENOUS

## 2019-08-29 MED ORDER — TRAVASOL 10 % IV SOLN
INTRAVENOUS | Status: AC
  Administered 2019-08-29: 18:00:00 via INTRAVENOUS
  Filled 2019-08-29: qty 1404

## 2019-08-29 NOTE — Progress Notes (Addendum)
SLP Cancellation Note  Patient Details Name: [name] MRN: [id number] DOB: Aug 02, 1961   Cancelled treatment:       Reason Eval/Treat Not Completed: Patient at procedure or test/unavailable. Transport here at this time to take pt to CT at this time. Will continue efforts.  Carmela Rima, [place] Speech Language Pathologist Office: [phone] Pager: [phone]  [name] 08/29/2019, 11:06 AM
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
## 2019-08-29 NOTE — Progress Notes (Signed)
Left knee MRI shows medial meniscal tear which would not be unexpected in someone in his age group along with significant bone bruising on the medial femoral condyle medial tibial plateau.  I think it is likely this bone bruising that is most symptomatic for [name] at this time.  I do think arthroscopy of the knee for any type of partial medial meniscectomy with the confounding variable of medial bone bruising is indicated at this time particularly in the light of a potential leaking anastomosis and some degree of undernutrition and rising white count.  I would favor CPM machine for left knee range of motion to allow the bone bruising to resolve and let his other issues improve prior to doing any type of arthroscopy on the knee.  The meniscal tear itself does not have any kind of flipped fragment or anything blocking the joint and therefore is likely part of the pain generation but not the biggest part that is keeping him from being able to weight-bear on that left knee.

## 2019-08-30 ENCOUNTER — Inpatient Hospital Stay (HOSPITAL_COMMUNITY): Payer: 59

## 2019-08-30 LAB — COMPREHENSIVE METABOLIC PANEL
ALT: 43 U/L (ref 0–44)
AST: 26 U/L (ref 15–41)
Albumin: 2.1 g/dL — ABNORMAL LOW (ref 3.5–5.0)
Alkaline Phosphatase: 150 U/L — ABNORMAL HIGH (ref 38–126)
Anion gap: 6 (ref 5–15)
BUN: 31 mg/dL — ABNORMAL HIGH (ref 6–20)
CO2: 25 mmol/L (ref 22–32)
Calcium: 8.7 mg/dL — ABNORMAL LOW (ref 8.9–10.3)
Chloride: 102 mmol/L (ref 98–111)
Creatinine, Ser: 0.95 mg/dL (ref 0.61–1.24)
GFR calc Af Amer: >60 mL/min (ref >60)
GFR calc non Af Amer: >60 mL/min (ref >60)
Glucose, Bld: 115 mg/dL — ABNORMAL HIGH (ref 70–99)
Potassium: 3.9 mmol/L (ref 3.5–5.1)
Sodium: 133 mmol/L — ABNORMAL LOW (ref 135–145)
Total Bilirubin: 0.4 mg/dL (ref 0.3–1.2)
Total Protein: 6.4 g/dL — ABNORMAL LOW (ref 6.5–8.1)

## 2019-08-30 LAB — MAGNESIUM: Magnesium: 1.9 mg/dL (ref 1.7–2.4)

## 2019-08-30 LAB — APTT: aPTT: 30 seconds (ref 24–36)

## 2019-08-30 LAB — CBC
HCT: 26.7 % — ABNORMAL LOW (ref 39.0–52.0)
Hemoglobin: 8.3 g/dL — ABNORMAL LOW (ref 13.0–17.0)
MCH: 30.3 pg (ref 26.0–34.0)
MCHC: 31.1 g/dL (ref 30.0–36.0)
MCV: 97.4 fL (ref 80.0–100.0)
Platelets: 532 10*3/uL — ABNORMAL HIGH (ref 150–400)
RBC: 2.74 MIL/uL — ABNORMAL LOW (ref 4.22–5.81)
RDW: 16.5 % — ABNORMAL HIGH (ref 11.5–15.5)
WBC: 11 10*3/uL — ABNORMAL HIGH (ref 4.0–10.5)
nRBC: 0 % (ref 0.0–0.2)

## 2019-08-30 LAB — GLUCOSE, CAPILLARY
Glucose-Capillary: 100 mg/dL — ABNORMAL HIGH (ref 70–99)
Glucose-Capillary: 100 mg/dL — ABNORMAL HIGH (ref 70–99)
Glucose-Capillary: 101 mg/dL — ABNORMAL HIGH (ref 70–99)
Glucose-Capillary: 108 mg/dL — ABNORMAL HIGH (ref 70–99)
Glucose-Capillary: 116 mg/dL — ABNORMAL HIGH (ref 70–99)
Glucose-Capillary: 118 mg/dL — ABNORMAL HIGH (ref 70–99)
Glucose-Capillary: 94 mg/dL (ref 70–99)

## 2019-08-30 LAB — PREALBUMIN: Prealbumin: 32.3 mg/dL (ref 18–38)

## 2019-08-30 LAB — PHOSPHORUS: Phosphorus: 3.7 mg/dL (ref 2.5–4.6)

## 2019-08-30 LAB — MRSA PCR SCREENING: MRSA by PCR: NEGATIVE

## 2019-08-30 MED ORDER — TRAVASOL 10 % IV SOLN
INTRAVENOUS | Status: DC
  Administered 2019-08-30: 17:00:00 via INTRAVENOUS
  Filled 2019-08-30: qty 842.4

## 2019-08-30 MED ORDER — VITAL 1.5 CAL PO LIQD
1000.0000 mL | ORAL | Status: DC
  Administered 2019-08-30 – 2019-08-31 (×3): 1000 mL
  Filled 2019-08-30 (×5): qty 1000

## 2019-08-30 MED ORDER — TRAVASOL 10 % IV SOLN
INTRAVENOUS | Status: DC
  Filled 2019-08-30: qty 1404

## 2019-08-30 MED ORDER — MAGNESIUM SULFATE IN D5W 1-5 GM/100ML-% IV SOLN
1.0000 g | Freq: Once | INTRAVENOUS | Status: AC
  Administered 2019-08-30: 1 g via INTRAVENOUS
  Filled 2019-08-30: qty 100

## 2019-08-30 MED ORDER — ENSURE ENLIVE PO LIQD
237.0000 mL | Freq: Two times a day (BID) | ORAL | Status: DC
  Administered 2019-08-31: 237 mL via ORAL

## 2019-08-30 MED ORDER — ASPIRIN 81 MG PO CHEW
81.0000 mg | CHEWABLE_TABLET | Freq: Every day | ORAL | Status: DC
  Administered 2019-08-30 – 2019-08-31 (×2): 81 mg via ORAL
  Filled 2019-08-30 (×2): qty 1

## 2019-08-30 MED ORDER — LORAZEPAM 2 MG/ML IJ SOLN
0.2500 mg | Freq: Two times a day (BID) | INTRAMUSCULAR | Status: DC | PRN
  Administered 2019-08-30: 0.25 mg via INTRAVENOUS
  Filled 2019-08-30: qty 1

## 2019-08-30 NOTE — Progress Notes (Signed)
OT Cancellation Note  Patient Details Name: [name] MRN: [id number] DOB: 1961-08-17   Cancelled Treatment:    Reason Eval/Treat Not Completed: Patient at procedure or test/ unavailable;Other (comment) Patient at procedure or test/unavailable. Upon entering the room, transportation arrived to take pt to swallow study. Will check back for OT session as time allows.  Lanier Clam., COTA/L Acute Rehabilitation Services [phone] [place] 08/30/2019, 2:15 PM

## 2019-08-30 NOTE — Progress Notes (Signed)
Inpatient Rehabilitation Admissions Coordinator  I await further progress with therapy before pursuing an inpt rehab admit with his insurance.  Danne Baxter, RN, MSN Rehab Admissions Coordinator [phone] 08/30/2019 3:05 PM

## 2019-08-30 NOTE — Progress Notes (Signed)
Modified Barium Swallow Progress Note  Patient Details  Name: [name] MRN: [id number] Date of Birth: [birth year]  Today's Date: 08/30/2019  Modified Barium Swallow completed.  Full report located under Chart Review in the Imaging Section.  Brief recommendations include the following:  Clinical Impression  Significants improvement in oral and pharyngeal swallow function compared to 11/2 MBS. Today it is recommended he initiate a Dys 3 texture with thin liquids. Lingual strength and efficiency to control and propel boluses improved but continues with mild delays and incoordination. Pharyngeal phase marked by flash penetration due to late laryngeal closure with one instance of uncleared vestibule trace penetration with straw sips thin. Swallow initiation occured after approximate 5 seconds to valleculae and pyriform sinuses intermittently. Recommend cup sips (no straws), pills whole in puree, head of bed elevation is limited with air matress/bed therefore utilize reverse trendelenburg position with meals. Will follow up.       Swallow Evaluation Recommendations       SLP Diet Recommendations: Dysphagia 3 (Mech soft) solids;Thin liquid   Liquid Administration via: Cup;No straw   Medication Administration: Whole meds with puree   Supervision: Full supervision/cueing for compensatory strategies;Staff to assist with self feeding   Compensations: Slow rate;Small sips/bites;Lingual sweep for clearance of pocketing   Postural Changes: Seated upright at 90 degrees   Oral Care Recommendations: Oral care BID        [name] 08/30/2019,2:30 PM   Orbie Pyo [place].Ed Risk analyst [phone] Office [phone]

## 2019-08-30 NOTE — Progress Notes (Signed)
PT Cancellation Note  Patient Details Name: [name] MRN: [id number] DOB: 1961/02/04   Cancelled Treatment:    Reason Eval/Treat Not Completed: Patient at procedure or test/unavailable. Upon entering the room, transportation arrived to take pt to swallow study. PT to return as able to advance mobility.  Kittie Plater, PT, DPT Acute Rehabilitation Services Pager #: [phone] Office #: [phone]    [place] Lopes 08/30/2019, 1:30 PM
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
## 2019-08-31 LAB — MAGNESIUM: Magnesium: 1.8 mg/dL (ref 1.7–2.4)

## 2019-08-31 LAB — COMPREHENSIVE METABOLIC PANEL
ALT: 37 U/L (ref 0–44)
AST: 22 U/L (ref 15–41)
Albumin: 2.2 g/dL — ABNORMAL LOW (ref 3.5–5.0)
Alkaline Phosphatase: 155 U/L — ABNORMAL HIGH (ref 38–126)
Anion gap: 12 (ref 5–15)
BUN: 29 mg/dL — ABNORMAL HIGH (ref 6–20)
CO2: 23 mmol/L (ref 22–32)
Calcium: 9 mg/dL (ref 8.9–10.3)
Chloride: 101 mmol/L (ref 98–111)
Creatinine, Ser: 0.91 mg/dL (ref 0.61–1.24)
GFR calc Af Amer: >60 mL/min (ref >60)
GFR calc non Af Amer: >60 mL/min (ref >60)
Glucose, Bld: 109 mg/dL — ABNORMAL HIGH (ref 70–99)
Potassium: 4.1 mmol/L (ref 3.5–5.1)
Sodium: 136 mmol/L (ref 135–145)
Total Bilirubin: 0.5 mg/dL (ref 0.3–1.2)
Total Protein: 6.9 g/dL (ref 6.5–8.1)

## 2019-08-31 LAB — GLUCOSE, CAPILLARY
Glucose-Capillary: 112 mg/dL — ABNORMAL HIGH (ref 70–99)
Glucose-Capillary: 114 mg/dL — ABNORMAL HIGH (ref 70–99)
Glucose-Capillary: 101 mg/dL — ABNORMAL HIGH (ref 70–99)
Glucose-Capillary: 120 mg/dL — ABNORMAL HIGH (ref 70–99)
Glucose-Capillary: 110 mg/dL — ABNORMAL HIGH (ref 70–99)

## 2019-08-31 LAB — PHOSPHORUS: Phosphorus: 3.6 mg/dL (ref 2.5–4.6)

## 2019-08-31 LAB — APTT: aPTT: 31 seconds (ref 24–36)

## 2019-08-31 MED ORDER — TRAVASOL 10 % IV SOLN
INTRAVENOUS | Status: AC
  Filled 2019-08-31: qty 421.2

## 2019-08-31 MED ORDER — DIPHENOXYLATE-ATROPINE 2.5-0.025 MG PO TABS
2.0000 | ORAL_TABLET | Freq: Two times a day (BID) | ORAL | Status: DC
  Administered 2019-08-31 – 2019-09-01 (×3): 2 via ORAL
  Filled 2019-08-31 (×3): qty 2

## 2019-08-31 MED ORDER — ASPIRIN 81 MG PO CHEW
324.0000 mg | CHEWABLE_TABLET | Freq: Every day | ORAL | Status: DC
  Administered 2019-09-01: 324 mg via ORAL
  Filled 2019-08-31: qty 4
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
## 2019-08-31 NOTE — Progress Notes (Signed)
Inpatient Rehabilitation Admissions Coordinator  Discussed with Legrand Como, PA, RN CM, PT, OTA, RN patient and then wife by phone. I will begin insurance authorization with [facility] for a possible admit Wednesday pending insurance approval.  Danne Baxter, RN, MSN Rehab Admissions Coordinator [phone] 08/31/2019 11:07 AM
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
## 2019-08-31 NOTE — Progress Notes (Signed)
I called and updated the patients wife
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
## 2019-09-01 ENCOUNTER — Other Ambulatory Visit: Payer: Self-pay

## 2019-09-01 ENCOUNTER — Encounter (HOSPITAL_COMMUNITY): Payer: Self-pay | Admitting: *Deleted

## 2019-09-01 ENCOUNTER — Encounter: Payer: Self-pay | Admitting: Family

## 2019-09-01 ENCOUNTER — Inpatient Hospital Stay (HOSPITAL_COMMUNITY)
Admission: RE | Admit: 2019-09-01 | Discharge: 2019-09-10 | DRG: 057 | Disposition: A | Discharge status: Home Health | Payer: 59 | Source: Intra-hospital | Attending: Physical Medicine & Rehabilitation | Admitting: Physical Medicine & Rehabilitation

## 2019-09-01 DIAGNOSIS — Z79899 Other long term (current) drug therapy: Secondary | ICD-10-CM | POA: Diagnosis not present

## 2019-09-01 DIAGNOSIS — T07XXXA Unspecified multiple injuries, initial encounter: Secondary | ICD-10-CM

## 2019-09-01 DIAGNOSIS — I6389 Other cerebral infarction: Secondary | ICD-10-CM

## 2019-09-01 DIAGNOSIS — Z933 Colostomy status: Secondary | ICD-10-CM | POA: Diagnosis not present

## 2019-09-01 DIAGNOSIS — E871 Hypo-osmolality and hyponatremia: Secondary | ICD-10-CM | POA: Diagnosis not present

## 2019-09-01 DIAGNOSIS — I69392 Facial weakness following cerebral infarction: Secondary | ICD-10-CM | POA: Diagnosis not present

## 2019-09-01 DIAGNOSIS — S36899D Unspecified injury of other intra-abdominal organs, subsequent encounter: Secondary | ICD-10-CM

## 2019-09-01 DIAGNOSIS — I10 Essential (primary) hypertension: Secondary | ICD-10-CM | POA: Diagnosis present

## 2019-09-01 DIAGNOSIS — S52502D Unspecified fracture of the lower end of left radius, subsequent encounter for closed fracture with routine healing: Secondary | ICD-10-CM | POA: Diagnosis not present

## 2019-09-01 DIAGNOSIS — F418 Other specified anxiety disorders: Secondary | ICD-10-CM

## 2019-09-01 DIAGNOSIS — I69322 Dysarthria following cerebral infarction: Secondary | ICD-10-CM | POA: Diagnosis present

## 2019-09-01 DIAGNOSIS — Z87442 Personal history of urinary calculi: Secondary | ICD-10-CM

## 2019-09-01 DIAGNOSIS — Z7982 Long term (current) use of aspirin: Secondary | ICD-10-CM | POA: Diagnosis not present

## 2019-09-01 DIAGNOSIS — R411 Anterograde amnesia: Secondary | ICD-10-CM | POA: Diagnosis present

## 2019-09-01 DIAGNOSIS — M25561 Pain in right knee: Secondary | ICD-10-CM | POA: Diagnosis present

## 2019-09-01 DIAGNOSIS — R1312 Dysphagia, oropharyngeal phase: Secondary | ICD-10-CM | POA: Diagnosis present

## 2019-09-01 DIAGNOSIS — Z8042 Family history of malignant neoplasm of prostate: Secondary | ICD-10-CM

## 2019-09-01 DIAGNOSIS — M25562 Pain in left knee: Secondary | ICD-10-CM

## 2019-09-01 DIAGNOSIS — Z8249 Family history of ischemic heart disease and other diseases of the circulatory system: Secondary | ICD-10-CM | POA: Diagnosis not present

## 2019-09-01 DIAGNOSIS — S82841D Displaced bimalleolar fracture of right lower leg, subsequent encounter for closed fracture with routine healing: Secondary | ICD-10-CM

## 2019-09-01 DIAGNOSIS — F419 Anxiety disorder, unspecified: Secondary | ICD-10-CM | POA: Diagnosis present

## 2019-09-01 DIAGNOSIS — T148XXA Other injury of unspecified body region, initial encounter: Secondary | ICD-10-CM

## 2019-09-01 DIAGNOSIS — S728X1D Other fracture of right femur, subsequent encounter for closed fracture with routine healing: Secondary | ICD-10-CM

## 2019-09-01 DIAGNOSIS — G8918 Other acute postprocedural pain: Secondary | ICD-10-CM

## 2019-09-01 DIAGNOSIS — L743 Miliaria, unspecified: Secondary | ICD-10-CM | POA: Diagnosis not present

## 2019-09-01 DIAGNOSIS — R238 Other skin changes: Secondary | ICD-10-CM | POA: Diagnosis not present

## 2019-09-01 DIAGNOSIS — Z932 Ileostomy status: Secondary | ICD-10-CM

## 2019-09-01 DIAGNOSIS — G479 Sleep disorder, unspecified: Secondary | ICD-10-CM | POA: Diagnosis present

## 2019-09-01 DIAGNOSIS — G4700 Insomnia, unspecified: Secondary | ICD-10-CM | POA: Diagnosis present

## 2019-09-01 DIAGNOSIS — D62 Acute posthemorrhagic anemia: Secondary | ICD-10-CM | POA: Diagnosis not present

## 2019-09-01 DIAGNOSIS — I69391 Dysphagia following cerebral infarction: Secondary | ICD-10-CM

## 2019-09-01 DIAGNOSIS — M24561 Contracture, right knee: Secondary | ICD-10-CM | POA: Diagnosis not present

## 2019-09-01 DIAGNOSIS — M24562 Contracture, left knee: Secondary | ICD-10-CM | POA: Diagnosis not present

## 2019-09-01 DIAGNOSIS — M24569 Contracture, unspecified knee: Secondary | ICD-10-CM

## 2019-09-01 DIAGNOSIS — I6350 Cerebral infarction due to unspecified occlusion or stenosis of unspecified cerebral artery: Secondary | ICD-10-CM | POA: Diagnosis not present

## 2019-09-01 DIAGNOSIS — I471 Supraventricular tachycardia, unspecified: Secondary | ICD-10-CM

## 2019-09-01 DIAGNOSIS — R7989 Other specified abnormal findings of blood chemistry: Secondary | ICD-10-CM | POA: Diagnosis present

## 2019-09-01 DIAGNOSIS — S42002D Fracture of unspecified part of left clavicle, subsequent encounter for fracture with routine healing: Secondary | ICD-10-CM

## 2019-09-01 DIAGNOSIS — I639 Cerebral infarction, unspecified: Secondary | ICD-10-CM | POA: Diagnosis present

## 2019-09-01 DIAGNOSIS — F4321 Adjustment disorder with depressed mood: Secondary | ICD-10-CM | POA: Diagnosis present

## 2019-09-01 DIAGNOSIS — Z6830 Body mass index (BMI) 30.0-30.9, adult: Secondary | ICD-10-CM

## 2019-09-01 DIAGNOSIS — E669 Obesity, unspecified: Secondary | ICD-10-CM | POA: Diagnosis present

## 2019-09-01 LAB — CBC
HCT: 27.9 % — ABNORMAL LOW (ref 39.0–52.0)
Hemoglobin: 8.7 g/dL — ABNORMAL LOW (ref 13.0–17.0)
MCH: 30.4 pg (ref 26.0–34.0)
MCHC: 31.2 g/dL (ref 30.0–36.0)
MCV: 97.6 fL (ref 80.0–100.0)
Platelets: 522 10*3/uL — ABNORMAL HIGH (ref 150–400)
RBC: 2.86 MIL/uL — ABNORMAL LOW (ref 4.22–5.81)
RDW: 17 % — ABNORMAL HIGH (ref 11.5–15.5)
WBC: 10 10*3/uL (ref 4.0–10.5)
nRBC: 0 % (ref 0.0–0.2)

## 2019-09-01 LAB — COMPREHENSIVE METABOLIC PANEL
ALT: 36 U/L (ref 0–44)
AST: 25 U/L (ref 15–41)
Albumin: 2.3 g/dL — ABNORMAL LOW (ref 3.5–5.0)
Alkaline Phosphatase: 145 U/L — ABNORMAL HIGH (ref 38–126)
Anion gap: 10 (ref 5–15)
BUN: 25 mg/dL — ABNORMAL HIGH (ref 6–20)
CO2: 24 mmol/L (ref 22–32)
Calcium: 9.1 mg/dL (ref 8.9–10.3)
Chloride: 102 mmol/L (ref 98–111)
Creatinine, Ser: 0.98 mg/dL (ref 0.61–1.24)
GFR calc Af Amer: >60 mL/min (ref >60)
GFR calc non Af Amer: >60 mL/min (ref >60)
Glucose, Bld: 107 mg/dL — ABNORMAL HIGH (ref 70–99)
Potassium: 4.5 mmol/L (ref 3.5–5.1)
Sodium: 136 mmol/L (ref 135–145)
Total Bilirubin: 0.5 mg/dL (ref 0.3–1.2)
Total Protein: 7 g/dL (ref 6.5–8.1)

## 2019-09-01 LAB — MAGNESIUM: Magnesium: 1.8 mg/dL (ref 1.7–2.4)

## 2019-09-01 LAB — GLUCOSE, CAPILLARY
Glucose-Capillary: 105 mg/dL — ABNORMAL HIGH (ref 70–99)
Glucose-Capillary: 106 mg/dL — ABNORMAL HIGH (ref 70–99)
Glucose-Capillary: 112 mg/dL — ABNORMAL HIGH (ref 70–99)
Glucose-Capillary: 116 mg/dL — ABNORMAL HIGH (ref 70–99)
Glucose-Capillary: 119 mg/dL — ABNORMAL HIGH (ref 70–99)

## 2019-09-01 LAB — APTT: aPTT: 31 seconds (ref 24–36)

## 2019-09-01 LAB — PHOSPHORUS: Phosphorus: 4.2 mg/dL (ref 2.5–4.6)

## 2019-09-01 MED ORDER — PROCHLORPERAZINE MALEATE 5 MG PO TABS: 5.0000 mg | ORAL_TABLET | Freq: Four times a day (QID) | ORAL | Status: DC | PRN

## 2019-09-01 MED ORDER — MAGIC MOUTHWASH
15.0000 mL | Freq: Four times a day (QID) | ORAL | Status: DC | PRN
  Filled 2019-09-01: qty 15

## 2019-09-01 MED ORDER — DIPHENHYDRAMINE HCL 25 MG PO CAPS: 25.0000 mg | ORAL_CAPSULE | Freq: Every evening | ORAL | Status: DC | PRN

## 2019-09-01 MED ORDER — TRAMADOL HCL 50 MG PO TABS
50.0000 mg | ORAL_TABLET | Freq: Four times a day (QID) | ORAL | Status: DC | PRN
  Administered 2019-09-02 – 2019-09-09 (×12): 100 mg via ORAL
  Filled 2019-09-01 (×14): qty 2

## 2019-09-01 MED ORDER — DIPHENHYDRAMINE HCL 12.5 MG/5ML PO ELIX
12.5000 mg | ORAL_SOLUTION | Freq: Four times a day (QID) | ORAL | Status: DC | PRN
  Administered 2019-09-01: 25 mg via ORAL
  Filled 2019-09-01: qty 10

## 2019-09-01 MED ORDER — QUETIAPINE FUMARATE 25 MG PO TABS
25.0000 mg | ORAL_TABLET | Freq: Every day | ORAL | Status: DC
  Administered 2019-09-01 – 2019-09-09 (×9): 25 mg via ORAL
  Filled 2019-09-01 (×9): qty 1

## 2019-09-01 MED ORDER — ONDANSETRON HCL 4 MG PO TABS: 4.0000 mg | ORAL_TABLET | Freq: Four times a day (QID) | ORAL | Status: DC | PRN

## 2019-09-01 MED ORDER — ACETAMINOPHEN 325 MG PO TABS
325.0000 mg | ORAL_TABLET | ORAL | Status: DC | PRN
  Administered 2019-09-04 – 2019-09-09 (×7): 650 mg via ORAL
  Filled 2019-09-01 (×9): qty 2

## 2019-09-01 MED ORDER — ASPIRIN 81 MG PO CHEW
324.0000 mg | CHEWABLE_TABLET | Freq: Every day | ORAL | Status: DC
  Administered 2019-09-02 – 2019-09-09 (×8): 324 mg via ORAL
  Filled 2019-09-01 (×8): qty 4

## 2019-09-01 MED ORDER — RESOURCE THICKENUP CLEAR PO POWD
ORAL | Status: DC | PRN
  Filled 2019-09-01: qty 125

## 2019-09-01 MED ORDER — VITAMIN D 25 MCG (1000 UNIT) PO TABS
2000.0000 [IU] | ORAL_TABLET | Freq: Two times a day (BID) | ORAL | Status: DC
  Administered 2019-09-01 – 2019-09-09 (×17): 2000 [IU] via ORAL
  Filled 2019-09-01 (×17): qty 2

## 2019-09-01 MED ORDER — PIPERACILLIN-TAZOBACTAM 3.375 G IVPB
3.3750 g | Freq: Three times a day (TID) | INTRAVENOUS | Status: AC
  Administered 2019-09-01 – 2019-09-05 (×13): 3.375 g via INTRAVENOUS
  Filled 2019-09-01 (×14): qty 50

## 2019-09-01 MED ORDER — ALUM & MAG HYDROXIDE-SIMETH 200-200-20 MG/5ML PO SUSP: 30.0000 mL | ORAL | Status: DC | PRN

## 2019-09-01 MED ORDER — LORAZEPAM 2 MG/ML IJ SOLN: 0.2500 mg | Freq: Two times a day (BID) | INTRAMUSCULAR | Status: DC | PRN

## 2019-09-01 MED ORDER — SODIUM CHLORIDE 0.9% FLUSH
10.0000 mL | INTRAVENOUS | Status: DC | PRN
  Administered 2019-09-04: 10 mL
  Filled 2019-09-01: qty 40

## 2019-09-01 MED ORDER — LIP MEDEX EX OINT
1.0000 "application " | TOPICAL_OINTMENT | Freq: Two times a day (BID) | CUTANEOUS | Status: DC
  Administered 2019-09-01 – 2019-09-09 (×14): 1 via TOPICAL
  Filled 2019-09-01: qty 7

## 2019-09-01 MED ORDER — FLEET ENEMA 7-19 GM/118ML RE ENEM: 1.0000 | ENEMA | Freq: Once | RECTAL | Status: DC | PRN

## 2019-09-01 MED ORDER — HEPARIN SODIUM (PORCINE) 5000 UNIT/ML IJ SOLN
5000.0000 [IU] | Freq: Three times a day (TID) | INTRAMUSCULAR | Status: DC
  Administered 2019-09-01 – 2019-09-08 (×20): 5000 [IU] via SUBCUTANEOUS
  Filled 2019-09-01 (×20): qty 1

## 2019-09-01 MED ORDER — POLYETHYLENE GLYCOL 3350 17 G PO PACK: 17.0000 g | PACK | Freq: Every day | ORAL | Status: DC | PRN

## 2019-09-01 MED ORDER — PROCHLORPERAZINE 25 MG RE SUPP: 12.5000 mg | Freq: Four times a day (QID) | RECTAL | Status: DC | PRN

## 2019-09-01 MED ORDER — DIPHENOXYLATE-ATROPINE 2.5-0.025 MG PO TABS
2.0000 | ORAL_TABLET | Freq: Two times a day (BID) | ORAL | Status: DC
  Administered 2019-09-01 – 2019-09-09 (×17): 2 via ORAL
  Filled 2019-09-01 (×18): qty 2

## 2019-09-01 MED ORDER — TRAZODONE HCL 50 MG PO TABS
25.0000 mg | ORAL_TABLET | Freq: Every evening | ORAL | Status: DC | PRN
  Administered 2019-09-01 – 2019-09-03 (×2): 50 mg via ORAL
  Filled 2019-09-01 (×2): qty 1

## 2019-09-01 MED ORDER — ORAL CARE MOUTH RINSE
15.0000 mL | Freq: Two times a day (BID) | OROMUCOSAL | Status: DC
  Administered 2019-09-02 – 2019-09-09 (×13): 15 mL via OROMUCOSAL

## 2019-09-01 MED ORDER — BOOST / RESOURCE BREEZE PO LIQD CUSTOM: 1.0000 | Freq: Three times a day (TID) | ORAL | Status: DC

## 2019-09-01 MED ORDER — PROCHLORPERAZINE EDISYLATE 10 MG/2ML IJ SOLN: 5.0000 mg | Freq: Four times a day (QID) | INTRAMUSCULAR | Status: DC | PRN

## 2019-09-01 MED ORDER — GUAIFENESIN-DM 100-10 MG/5ML PO SYRP: 5.0000 mL | ORAL_SOLUTION | Freq: Four times a day (QID) | ORAL | Status: DC | PRN

## 2019-09-01 MED ORDER — SODIUM CHLORIDE 0.9% FLUSH
10.0000 mL | Freq: Two times a day (BID) | INTRAVENOUS | Status: DC
  Administered 2019-09-02: 15:00:00 15 mL
  Administered 2019-09-03 – 2019-09-09 (×3): 10 mL

## 2019-09-01 MED ORDER — ONDANSETRON HCL 4 MG/2ML IJ SOLN: 4.0000 mg | Freq: Four times a day (QID) | INTRAMUSCULAR | Status: DC | PRN

## 2019-09-01 MED ORDER — LIDOCAINE HCL URETHRAL/MUCOSAL 2 % EX GEL: CUTANEOUS | Status: DC | PRN

## 2019-09-01 MED ORDER — PROPRANOLOL HCL 20 MG PO TABS
20.0000 mg | ORAL_TABLET | Freq: Three times a day (TID) | ORAL | Status: DC
  Administered 2019-09-01 – 2019-09-09 (×25): 20 mg via ORAL
  Filled 2019-09-01 (×25): qty 1

## 2019-09-01 MED ORDER — CHLORHEXIDINE GLUCONATE 0.12 % MT SOLN
15.0000 mL | Freq: Two times a day (BID) | OROMUCOSAL | Status: DC
  Administered 2019-09-01 – 2019-09-09 (×12): 15 mL via OROMUCOSAL
  Filled 2019-09-01 (×15): qty 15

## 2019-09-01 MED ORDER — BISACODYL 10 MG RE SUPP: 10.0000 mg | Freq: Every day | RECTAL | Status: DC | PRN

## 2019-09-01 MED ORDER — VITAL 1.5 CAL PO LIQD
1000.0000 mL | ORAL | Status: DC
  Administered 2019-09-01 – 2019-09-02 (×2): 1000 mL
  Filled 2019-09-01 (×4): qty 1000

## 2019-09-01 NOTE — Progress Notes (Signed)
Pt admitted to 4W24. Wife is at bedside. Pt on air mattress and cortrak is in place. Pt has IV connected running KVO. Tube feedings are at goal rate of 70. Pt has wife at beside and is alert and oriented. Call bell is within reach and bed alarm in place. Vitals are within normal limits. Pt complaining of knee pain. Wife verbalized pain medication was given prior to transferring pt to unit. Will continue to monitor.
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
## 2019-09-01 NOTE — Progress Notes (Signed)
The chaplain visited [name] who seemed to be in a better mood today than their last visit.  [name] got very emotional about missing his family and about not spending thanksgiving with them.  He did however seem very hopeful about moving to rehabilitation and getting better.  He did talk about how much he has improved.  The chaplain will not follow-up as he is being discharged from the unit.  Brion Aliment Chaplain Resident For questions concerning this note please contact me by pager [phone]

## 2019-09-01 NOTE — Progress Notes (Addendum)
During shift so far, patient has been alert & responsive. Medications given & tolerated well. He c/o generalized pain but did not want to take anything for it. Instead he wanted benadryl for sleep. His ileostomy bag has liquidy stool in it, but is not draining well. It was irrigated during med pass & again at approximately 2200. He also had a lot of air in the bag. Air was released. Iv ABT & tube feeding are infusing at prescribed rate without complication. K pad ordered, but patient stated that it does not help. He refused his prevalon boots. Heels are floated & he has an air mattress. He requested something else to help him sleep & was given his prn trazodone. At this time he is asleep. Will continue to monitor.

## 2019-09-01 NOTE — Discharge Instructions (Signed)
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 Please continue your full dose aspirin. Please follow up with neurology clinic in 4 weeks.  Please follow up with orthopedics for below inuries - Right femur fracture  - Left clavicle and Left radius fractures - Right ankle fracture  - [facility], [place] [phone]  OPEN ABDOMINAL SURGERY: POST OP INSTRUCTIONS  Always review your discharge instruction sheet given to you by the facility where your surgery was performed.  IF YOU HAVE DISABILITY OR FAMILY LEAVE FORMS, YOU MUST BRING THEM TO THE OFFICE FOR PROCESSING.  PLEASE DO NOT GIVE THEM TO YOUR DOCTOR.  1. A prescription for pain medication may be given to you upon discharge.  Take your pain medication as prescribed, if needed.  If narcotic pain medicine is not needed, then you may take acetaminophen (Tylenol) or ibuprofen (Advil) as needed. 2. Take your usually prescribed medications unless otherwise directed. 3. If you need a refill on your pain medication, please contact your pharmacy. They will contact our office to request authorization.  Prescriptions will not be filled after 5pm or on week-ends. 4. You should follow a light diet the first few days after arrival home, such as soup and crackers, pudding, etc.unless your doctor has advised otherwise. A high-fiber, low fat diet can be resumed as tolerated.   Be sure to include lots of fluids daily. Most patients will experience some swelling and bruising on the chest and neck area.  Ice packs will help.  Swelling and bruising can take several days to resolve 5. Most patients will experience some swelling and bruising in the area of the incision. Ice pack will help. Swelling and bruising can take several days to resolve..  6. It is common to experience some constipation if taking pain medication after surgery.  Increasing fluid intake and taking a stool softener will usually help  or prevent this problem from occurring.  A mild laxative (Milk of Magnesia or Miralax) should be taken according to package directions if there are no bowel movements after 48 hours. 7.  You may have steri-strips (small skin tapes) in place directly over the incision.  These strips should be left on the skin for 7-10 days.  If your surgeon used skin glue on the incision, you may shower in 24 hours.  The glue will flake off over the next 2-3 weeks.  Any sutures or staples will be removed at the office during your follow-up visit. You may find that a light gauze bandage over your incision may keep your staples from being rubbed or pulled. You may shower and replace the bandage daily. 8. ACTIVITIES:  You may resume regular (light) daily activities beginning the next day--such as daily self-care, walking, climbing stairs--gradually increasing activities as tolerated.  You may have sexual intercourse when it is comfortable.  Refrain from any heavy lifting or straining until approved by your doctor. a. You may drive when you no longer are taking prescription pain medication, you can comfortably wear a seatbelt, and you can safely maneuver your car and apply brakes b. Return to Work: ___________________________________ 50. You should see your doctor in the office for a follow-up appointment approximately two weeks after your surgery.  Make sure that you call for this appointment within a day or two after you arrive home to insure a convenient appointment time. OTHER INSTRUCTIONS:  _____________________________________________________________ _____________________________________________________________  WHEN TO CALL YOUR DOCTOR: 1. Fever over 101.0 2.  Inability to urinate 3. Nausea and/or vomiting 4. Extreme swelling or bruising 5. Continued bleeding from incision. 6. Increased pain, redness, or drainage from the incision. 7. Difficulty swallowing or breathing 8. Muscle cramping or spasms. 9. Numbness or  tingling in hands or feet or around lips.  The clinic staff is available to answer your questions during regular business hours.  Please dont hesitate to call and ask to speak to one of the nurses if you have concerns.  For further questions, please visit [email or web address]   Ileostomy Ileostomy is a procedure to permanently or temporarily redirect part of the small intestine (ileum) to an external opening (stoma) in the abdomen. This means that waste is passed through the stoma and into an external bag (ostomy pouch), instead of passing through the rest of the intestines and the rectum (bowel). You may need this procedure if your bowel is diseased or has been partially removed. Tell a health care provider about:  Any allergies you have.  All medicines you are taking, including vitamins, herbs, eye drops, creams, and over-the-counter medicines.  Any problems you or family members have had with anesthetic medicines.  Any blood disorders you have.  Any surgeries you have had.  Any medical conditions you have or have had.  Whether you are pregnant or may be pregnant. What are the risks? Generally, this is a safe procedure. However, problems may occur, including:  Infection.  Bleeding.  Allergic reactions to medicines.  Damage to other structures or organs.  Skin irritation around the stoma.  Narrowing or collapsing of the stoma.  Blockage of the intestine (ileus).  Tissue bulging through a weak spot in the abdomen muscles (hernia).  Part of the small intestine coming out through the stoma (prolapse).  Difficulty absorbing nutrients from foods.  Dehydration. What happens before the procedure? Exams and tests  You will have a physical exam, which may include a rectal exam.  You may have tests, such as: ? Blood tests. ? Stool tests. ? X-rays. ? Colonoscopy. Medicines Ask your health care provider about:  Changing or stopping your regular medicines.  This is especially important if you are taking diabetes medicines or blood thinners.  Taking medicines such as aspirin and ibuprofen. These medicines can thin your blood. Do not take these medicines unless your health care provider tells you to take them.  Taking over-the-counter medicines, vitamins, herbs, and supplements. Eating and drinking Follow instructions from your health care provider about eating and drinking, which may include:  8 hours before the procedure - stop eating heavy meals or foods, such as meat, fried foods, or fatty foods.  6 hours before the procedure - stop eating light meals or foods, such as toast or cereal.  6 hours before the procedure - stop drinking milk or drinks that contain milk.  2 hours before the procedure - stop drinking clear liquids. Staying hydrated Follow instructions from your health care provider about hydration, which may include:  Up to 2 hours before the procedure - you may continue to drink clear liquids, such as water, clear fruit juice, black coffee, and plain tea. General instructions  A mark will be put on your abdomen where the stoma will be placed to ensure that it is in a good location for care.  Ask your health care provider what steps will be taken to help prevent infection. These may include: ? Removing hair at the surgery site. ? Washing skin with a germ-killing soap. ? Taking antibiotic medicine.  Do  not use any products that contain nicotine or tobacco for at least 4-6 weeks before the procedure. These products include cigarettes, e-cigarettes, and chewing tobacco. If you need help quitting, ask your health care provider. What happens during the procedure?   An IV will be inserted into one of your veins.  You may be given a medicine to help you relax (sedative).  You will be given a medicine to make you fall asleep (general anesthetic).  An incision will be made in your abdomen.  Your ileum will be cut so that it is  divided in two. The end of the ileum will be attached to your abdomen with stitches (sutures) to make the stoma.  An ostomy pouch will be attached to your stoma.  Your incision will be covered with a bandage (dressing). The procedure may vary among health care providers and hospitals. What happens after the procedure?  You may continue to receive fluids and medicines through an IV.  You will have some pain. Medicines will be available to help you.  Your blood pressure, heart rate, breathing rate, and blood oxygen level will be monitored until you leave the hospital.  You may not be able to drink fluids or eat solid food for at least 24 hours after your procedure. You may be given ice chips to suck on until you are able to drink fluids.  You may have fluid draining from your stoma and your rectum.  You will be taught how to care for your stoma and ostomy pouch.  Your stoma may be dark-colored, swollen, and bruised.  You may have to wear compression stockings. These stockings help to prevent blood clots and reduce swelling in your legs. Summary  Ileostomy is a procedure to permanently or temporarily redirect part of the small intestine (ileum) to an external opening (stoma) in the abdomen. This means that waste is passed through the stoma and into an external bag (ostomy pouch).  Before the procedure, you will have a mark put on your abdomen where the stoma will be placed to ensure that it is in a good location for care.  Before the procedure, follow instructions from your health care provider about medicines and about eating and drinking.  Once the stoma has been created, an ostomy pouch will be attached to your stoma.  You will be taught how to care for your stoma and ostomy pouch. This information is not intended to replace advice given to you by your health care provider. Make sure you discuss any questions you have with your health care provider. Document Released: 09/04/2015  Document Revised: 06/01/2018 Document Reviewed: 06/01/2018 Elsevier Patient Education  2020 [place] After This sheet gives you information about how to care for yourself after your procedure. Your health care provider may also give you more specific instructions. If you have problems or questions, contact your health care provider. What can I expect after the procedure? After the procedure, it is common to have:  A small amount of blood or clear fluid leaking from your stoma.  Pain and discomfort in your abdomen, especially around your stoma.  Irregular bowel movements for several days.  Loose stool. Follow these instructions at home: Medicines  Take over-the-counter and prescription medicines only as told by your health care provider.  If you were prescribed an antibiotic medicine, take it as told by your health care provider. Do not stop taking the antibiotic even if you start to feel better. Stoma and incision  care   Keep the skin that surrounds your stoma clean and dry.  Follow instructions from your health care provider about how to take care of your incision. Make sure you: ? Wash your hands with soap and water before and after you change your bandage (dressing). If soap and water are not available, use hand sanitizer. ? Change your dressing as told by your health care provider. ? Leave stitches (sutures), skin glue, or adhesive strips in place. These skin closures may need to stay in place for 2 weeks or longer. If adhesive strip edges start to loosen and curl up, you may trim the loose edges. Do not remove adhesive strips completely unless your health care provider tells you to do that.  Check your stoma area every day for signs of infection. Check for: ? More redness, swelling, or pain. ? More fluid or blood. ? Warmth. ? Pus or a bad smell.  Follow your health care provider's instructions about changing and cleaning your ostomy pouch.  Keep  supplies with you at all times to care for your stoma and ostomy pouch. Also keep extra clothing with you. Eating and drinking   Follow instructions from your health care provider about eating or drinking restrictions.  Pay attention to which foods and drinks cause problems with digestion, such as gas, constipation, or diarrhea.  Avoid spicy foods and caffeine while your stoma heals.  Eat meals and snacks at regular intervals.  Drink enough fluid to keep your urine pale yellow. Activity   Return to your normal activities as told by your health care provider. Ask your health care provider what activities are safe for you.  Rest as much as possible while your stoma heals.  Avoid intense physical activity for as long as you are told by your health care provider.  Do not lift anything that is heavier than 10 lb (4.5 kg), or the limit that you are told, for 6 weeks or until your health care provider says that it is safe. General instructions  Do not drive or use heavy machinery while taking prescription pain medicine.  Wear compression stockings as told by your health care provider. These stockings help to prevent blood clots and reduce swelling in your legs.  Do not take baths, swim, or use a hot tub until your health care provider approves. Ask your health care provider if you may take showers.  Do not use any products that contain nicotine or tobacco, such as cigarettes, e-cigarettes, and chewing tobacco. These can delay incision healing after surgery. If you need help quitting, ask your health care provider.  (Women) Talk with your health care provider if you plan to become pregnant or if you take birth control pills.  Keep all follow-up visits as told by your health care provider. This is important. Contact a health care provider if:  You have more redness, swelling, or pain at or around your stoma.  You have more fluid or blood coming from your stoma.  Your stoma feels warm  to the touch.  You have pus or a bad smell coming from your stoma.  You have a fever.  You have loose stools that do not become thicker after several weeks.  You have bowel movements more often or less often than your health care provider tells you to expect.  You feel nauseous.  You vomit.  You have abdominal pain, bloating, pressure, or cramping.  You have problems with sexual activity.  You have an unusual lack of  energy (fatigue).  You are unusually thirsty or you always have a dry mouth. Get help right away if:  You feel dizzy or light-headed.  You have pain or cramps in your abdomen that get worse or do not go away with medicine.  Your stoma suddenly changes size or color.  You have shortness of breath.  You have bleeding from your stoma that does not stop.  You vomit more than one time.  You faint.  You have internal tissue coming out of your stoma (prolapse).  You have an irregular heartbeat.  You have chest pain. Summary  Take over-the-counter and prescription medicines only as told by your health care provider.  Follow your health care provider's instructions about how to take care of your incision and stoma.  Follow instructions from your health care provider about eating or drinking restrictions.  Do not take baths, swim, or use a hot tub until your health care provider approves. Ask your health care provider if you may take showers.  Contact a health care provider if you have more redness, swelling, or pain at or around your stoma. This information is not intended to replace advice given to you by your health care provider. Make sure you discuss any questions you have with your health care provider. Document Released: 09/04/2015 Document Revised: 06/01/2018 Document Reviewed: 06/01/2018 Elsevier Patient Education  [place].  Please avoid medications such a ibuprofen and other nsaids that can be harmful to your kidneys
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
## 2019-09-01 NOTE — TOC Transition Note (Signed)
Transition of Care [facility]) - CM/SW Discharge Note   Patient Details  Name: [name] MRN: [id number] Date of Birth: April 22, 1961  [facility] CM/SW Contact:  Ella Bodo, RN Phone Number: 09/01/2019, 1:13 PM   Clinical Narrative: Pt medically stable for discharge and insurance authorization received for admission to [facility] today.        Final next level of care: IP Rehab Facility Barriers to Discharge: Barriers Resolved            Discharge Plan and Services   Discharge Planning Services: CM Consult Post Acute Care Choice: IP Rehab                               Social Determinants of Health (SDOH) Interventions     Readmission Risk Interventions No flowsheet data found.  Reinaldo Raddle, RN, BSN  Trauma/Neuro ICU Case Manager [phone]
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
## 2019-09-01 NOTE — Progress Notes (Unsigned)
Pt transferred to 4W. Report given to [place], Therapist, sports.
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
## 2019-09-01 NOTE — Progress Notes (Signed)
Inpatient Rehabilitation Admissions Coordinator  I have insurance approval to admit pt to inpt rehab today. I met with patient and his wife at bedside and they are in agreement to admit. I notified [facility] and we will make the arrangements to admit today.  Danne Baxter, RN, MSN Rehab Admissions Coordinator [phone] 09/01/2019 12:47 PM

## 2019-09-02 ENCOUNTER — Inpatient Hospital Stay (HOSPITAL_COMMUNITY): Payer: 59

## 2019-09-02 ENCOUNTER — Inpatient Hospital Stay (HOSPITAL_COMMUNITY): Payer: 59 | Admitting: Occupational Therapy

## 2019-09-02 DIAGNOSIS — I6389 Other cerebral infarction: Secondary | ICD-10-CM

## 2019-09-02 LAB — COMPREHENSIVE METABOLIC PANEL
ALT: 38 U/L (ref 0–44)
AST: 25 U/L (ref 15–41)
Albumin: 2.4 g/dL — ABNORMAL LOW (ref 3.5–5.0)
Alkaline Phosphatase: 148 U/L — ABNORMAL HIGH (ref 38–126)
Anion gap: 10 (ref 5–15)
BUN: 21 mg/dL — ABNORMAL HIGH (ref 6–20)
CO2: 24 mmol/L (ref 22–32)
Calcium: 9.3 mg/dL (ref 8.9–10.3)
Chloride: 98 mmol/L (ref 98–111)
Creatinine, Ser: 0.96 mg/dL (ref 0.61–1.24)
GFR calc Af Amer: >60 mL/min (ref >60)
GFR calc non Af Amer: >60 mL/min (ref >60)
Glucose, Bld: 109 mg/dL — ABNORMAL HIGH (ref 70–99)
Potassium: 4.9 mmol/L (ref 3.5–5.1)
Sodium: 132 mmol/L — ABNORMAL LOW (ref 135–145)
Total Bilirubin: 0.3 mg/dL (ref 0.3–1.2)
Total Protein: 6.9 g/dL (ref 6.5–8.1)

## 2019-09-02 LAB — CBC
HCT: 27.6 % — ABNORMAL LOW (ref 39.0–52.0)
Hemoglobin: 8.6 g/dL — ABNORMAL LOW (ref 13.0–17.0)
MCH: 30.7 pg (ref 26.0–34.0)
MCHC: 31.2 g/dL (ref 30.0–36.0)
MCV: 98.6 fL (ref 80.0–100.0)
Platelets: 466 10*3/uL — ABNORMAL HIGH (ref 150–400)
RBC: 2.8 MIL/uL — ABNORMAL LOW (ref 4.22–5.81)
RDW: 17.1 % — ABNORMAL HIGH (ref 11.5–15.5)
WBC: 9.7 10*3/uL (ref 4.0–10.5)
nRBC: 0 % (ref 0.0–0.2)

## 2019-09-02 LAB — MAGNESIUM: Magnesium: 1.6 mg/dL — ABNORMAL LOW (ref 1.7–2.4)

## 2019-09-02 LAB — PHOSPHORUS: Phosphorus: 4.3 mg/dL (ref 2.5–4.6)

## 2019-09-02 MED ORDER — OXYCODONE HCL 5 MG PO TABS: 5.0000 mg | ORAL_TABLET | ORAL | Status: DC | PRN

## 2019-09-02 MED ORDER — MELATONIN 3 MG PO TABS
6.0000 mg | ORAL_TABLET | Freq: Every evening | ORAL | Status: DC | PRN
  Administered 2019-09-03: 6 mg via ORAL
  Filled 2019-09-02 (×2): qty 2

## 2019-09-02 MED ORDER — CHLORHEXIDINE GLUCONATE CLOTH 2 % EX PADS
6.0000 | MEDICATED_PAD | Freq: Every day | CUTANEOUS | Status: DC
  Administered 2019-09-02 – 2019-09-07 (×5): 6 via TOPICAL

## 2019-09-02 MED ORDER — NON FORMULARY: 5.0000 mg | Freq: Every evening | Status: DC | PRN

## 2019-09-02 MED ORDER — SODIUM CHLORIDE 0.9% FLUSH
10.0000 mL | INTRAVENOUS | Status: DC | PRN
  Administered 2019-09-07 (×2): 10 mL
  Filled 2019-09-02 (×2): qty 40

## 2019-09-02 NOTE — Plan of Care (Signed)
  Problem: Consults Goal: RH STROKE PATIENT EDUCATION Description: See Patient Education module for education specifics  Outcome: Progressing   Problem: RH BOWEL ELIMINATION Goal: RH STG MANAGE BOWEL WITH ASSISTANCE Description: STG Manage Bowel with Mod Assistance. Outcome: Progressing Goal: RH STG MANAGE BOWEL W/MEDICATION W/ASSISTANCE Description: STG Manage Bowel with Medication with Mod Assistance. Outcome: Progressing   Problem: RH BLADDER ELIMINATION Goal: RH STG MANAGE BLADDER WITH ASSISTANCE Description: STG Manage Bladder With Min Assistance Outcome: Progressing   Problem: RH SKIN INTEGRITY Goal: RH STG SKIN FREE OF INFECTION/BREAKDOWN Description: No new breakdown with min assist  Outcome: Progressing Goal: RH STG ABLE TO PERFORM INCISION/WOUND CARE W/ASSISTANCE Description: STG Able To Perform Incision/Wound Care With Mod Assistance. Outcome: Progressing   Problem: RH COGNITION-NURSING Goal: RH STG ANTICIPATES NEEDS/CALLS FOR ASSIST W/ASSIST/CUES Description: STG Anticipates Needs/Calls for Assist With Min Assistance/Cues. Outcome: Progressing   Problem: RH PAIN MANAGEMENT Goal: RH STG PAIN MANAGED AT OR BELOW PT'S PAIN GOAL Description: < 4 out of 10.  Outcome: Progressing   Problem: RH KNOWLEDGE DEFICIT Goal: RH STG INCREASE KNOWLEDGE OF DYSPHAGIA/FLUID INTAKE Description: Pt will demonstrate proper swallowing techniques and will be able to verbalize strategies to manage dysphagia while in rehab with min assist.  Outcome: Progressing

## 2019-09-02 NOTE — Progress Notes (Addendum)
Patient noted laying in bed. New bottle of tube feeding was hung, ileostomy bag irrigated again & patient turned to the right side supported by pillows. Urinal was emptied. He still has pain in his back, but received a little relief when turned. No distress noted. Will continue to monitor

## 2019-09-02 NOTE — Progress Notes (Addendum)
Patient c/o pain to his lower back. He stated that this is the worse he has worse yet. He was persuaded to take some pain medication. He was repositioned by nurse tech, but still was uncomfortable with pillows to both sides. He asked for the pills to be push further under him, but was encouraged to turn to one side to give his back some relief. He was turned to the right side with pillow support. Cortrak is still infusing the last bottle of tube feeding.His ileostomy was irrigated again. He has a foley bag attached to his ileostomy bag. Stool is still loose but is still not draining well. Has to be manipulated to get it to drain.Stools are watery. Will continue to monitor.
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
## 2019-09-02 NOTE — Progress Notes (Signed)
Bilateral lower extremity venous duplex has been completed. Preliminary results can be found in CV Proc through chart review.   09/02/19 11:26 AM Carlos Levering RVT

## 2019-09-02 NOTE — Progress Notes (Signed)
IV team was consulted for cap & flush of PICC line. They will draw his morning labs as well. No acute distress noted.Will continue to monitor.
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
## 2019-09-03 ENCOUNTER — Inpatient Hospital Stay (HOSPITAL_COMMUNITY): Payer: 59 | Admitting: Occupational Therapy

## 2019-09-03 ENCOUNTER — Inpatient Hospital Stay (HOSPITAL_COMMUNITY): Payer: 59

## 2019-09-03 ENCOUNTER — Inpatient Hospital Stay (HOSPITAL_COMMUNITY): Payer: 59 | Admitting: Physical Therapy

## 2019-09-03 LAB — COMPREHENSIVE METABOLIC PANEL
ALT: 38 U/L (ref 0–44)
AST: 22 U/L (ref 15–41)
Albumin: 2.4 g/dL — ABNORMAL LOW (ref 3.5–5.0)
Alkaline Phosphatase: 141 U/L — ABNORMAL HIGH (ref 38–126)
Anion gap: 10 (ref 5–15)
BUN: 21 mg/dL — ABNORMAL HIGH (ref 6–20)
CO2: 27 mmol/L (ref 22–32)
Calcium: 9.5 mg/dL (ref 8.9–10.3)
Chloride: 97 mmol/L — ABNORMAL LOW (ref 98–111)
Creatinine, Ser: 0.98 mg/dL (ref 0.61–1.24)
GFR calc Af Amer: >60 mL/min (ref >60)
GFR calc non Af Amer: >60 mL/min (ref >60)
Glucose, Bld: 108 mg/dL — ABNORMAL HIGH (ref 70–99)
Potassium: 4.6 mmol/L (ref 3.5–5.1)
Sodium: 134 mmol/L — ABNORMAL LOW (ref 135–145)
Total Bilirubin: 0.6 mg/dL (ref 0.3–1.2)
Total Protein: 7.1 g/dL (ref 6.5–8.1)

## 2019-09-03 LAB — CBC
HCT: 28.1 % — ABNORMAL LOW (ref 39.0–52.0)
Hemoglobin: 8.6 g/dL — ABNORMAL LOW (ref 13.0–17.0)
MCH: 30.4 pg (ref 26.0–34.0)
MCHC: 30.6 g/dL (ref 30.0–36.0)
MCV: 99.3 fL (ref 80.0–100.0)
Platelets: 463 10*3/uL — ABNORMAL HIGH (ref 150–400)
RBC: 2.83 MIL/uL — ABNORMAL LOW (ref 4.22–5.81)
RDW: 17.2 % — ABNORMAL HIGH (ref 11.5–15.5)
WBC: 8.6 10*3/uL (ref 4.0–10.5)
nRBC: 0 % (ref 0.0–0.2)

## 2019-09-03 LAB — PHOSPHORUS: Phosphorus: 5 mg/dL — ABNORMAL HIGH (ref 2.5–4.6)

## 2019-09-03 MED ORDER — BOOST / RESOURCE BREEZE PO LIQD CUSTOM
1.0000 | Freq: Three times a day (TID) | ORAL | Status: DC
  Administered 2019-09-03 – 2019-09-06 (×7): 1 via ORAL

## 2019-09-03 MED ORDER — VITAL 1.5 CAL PO LIQD
1100.0000 mL | ORAL | Status: DC
  Administered 2019-09-03: 1100 mL
  Filled 2019-09-03 (×2): qty 2000

## 2019-09-03 NOTE — Progress Notes (Signed)
Patient resting throughout shift, alert and cooperative, respiration unlabored on RA, slept a total of approximately 8.5 hrs this shift, Continue TF per orders, dressing CDI to surgical ABD wounds. Voids in urina, sp/ ileostomy site with brown stool drainage into bag and I/O monitor closely. IV Zosyn continue via Rt upper PICC site, tolerated well  Repositioned several times with pillows/ and skin care as needed, remains on air mattress bed. Continue current regime, monitor

## 2019-09-03 NOTE — Plan of Care (Signed)
LTGs established °
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
## 2019-09-03 NOTE — Care Management Note (Signed)
[facility] Individual Statement of Services  Patient Name:  [name]  Date:  09/03/2019  Welcome to the [facility].  Our goal is to provide you with an individualized program based on your diagnosis and situation, designed to meet your specific needs.  With this comprehensive rehabilitation program, you will be expected to participate in at least 3 hours of rehabilitation therapies Monday-Friday, with modified therapy programming on the weekends.  Your rehabilitation program will include the following services:  Physical Therapy (PT), Occupational Therapy (OT), Speech Therapy (ST), 24 hour per day rehabilitation nursing, Therapeutic Recreaction (TR), Neuropsychology, Case Management (Social Worker), Rehabilitation Medicine, Nutrition Services and Pharmacy Services  Weekly team conferences will be held on Wednesday to discuss your progress.  Your Social Worker will talk with you frequently to get your input and to update you on team discussions.  Team conferences with you and your family in attendance may also be held.  Expected length of stay: 19-21 days Overall anticipated outcome: supervision-min assist level  Depending on your progress and recovery, your program may change. Your Social Worker will coordinate services and will keep you informed of any changes. Your Social Worker's name and contact numbers are listed  below.  The following services may also be recommended but are not provided by the [facility] will be made to provide these services after discharge if needed.  Arrangements include referral to agencies that provide these services.  Your insurance has been verified to be:  [employer]Com[employer]ical Your primary doctor is:  Evelina Dun  Pertinent information will be shared with  your doctor and your insurance company.  Social Worker:  Ovidio Kin, [place] or ([phone]  Information discussed with and copy given to patient by: Elease Hashimoto, 09/03/2019, 10:21 AM
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
## 2019-09-03 NOTE — Progress Notes (Signed)
Inpatient Rehabilitation  Patient information reviewed and entered into eRehab system by Dorian Renfro M. Bennette Hasty, M.A., CCC/SLP, PPS Coordinator.  Information including medical coding, functional ability and quality indicators will be reviewed and updated through discharge.    
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
## 2019-09-04 ENCOUNTER — Inpatient Hospital Stay (HOSPITAL_COMMUNITY): Payer: 59

## 2019-09-04 ENCOUNTER — Inpatient Hospital Stay (HOSPITAL_COMMUNITY): Payer: 59 | Admitting: Speech Pathology

## 2019-09-04 ENCOUNTER — Inpatient Hospital Stay (HOSPITAL_COMMUNITY): Payer: 59 | Admitting: Occupational Therapy

## 2019-09-04 LAB — COMPREHENSIVE METABOLIC PANEL
ALT: 35 U/L (ref 0–44)
AST: 21 U/L (ref 15–41)
Albumin: 2.5 g/dL — ABNORMAL LOW (ref 3.5–5.0)
Alkaline Phosphatase: 155 U/L — ABNORMAL HIGH (ref 38–126)
Anion gap: 13 (ref 5–15)
BUN: 24 mg/dL — ABNORMAL HIGH (ref 6–20)
CO2: 25 mmol/L (ref 22–32)
Calcium: 9.6 mg/dL (ref 8.9–10.3)
Chloride: 94 mmol/L — ABNORMAL LOW (ref 98–111)
Creatinine, Ser: 1 mg/dL (ref 0.61–1.24)
GFR calc Af Amer: >60 mL/min (ref >60)
GFR calc non Af Amer: >60 mL/min (ref >60)
Glucose, Bld: 100 mg/dL — ABNORMAL HIGH (ref 70–99)
Potassium: 4.5 mmol/L (ref 3.5–5.1)
Sodium: 132 mmol/L — ABNORMAL LOW (ref 135–145)
Total Bilirubin: 0.3 mg/dL (ref 0.3–1.2)
Total Protein: 7.2 g/dL (ref 6.5–8.1)

## 2019-09-04 LAB — CBC
HCT: 28.8 % — ABNORMAL LOW (ref 39.0–52.0)
Hemoglobin: 8.8 g/dL — ABNORMAL LOW (ref 13.0–17.0)
MCH: 30.2 pg (ref 26.0–34.0)
MCHC: 30.6 g/dL (ref 30.0–36.0)
MCV: 99 fL (ref 80.0–100.0)
Platelets: 476 10*3/uL — ABNORMAL HIGH (ref 150–400)
RBC: 2.91 MIL/uL — ABNORMAL LOW (ref 4.22–5.81)
RDW: 17 % — ABNORMAL HIGH (ref 11.5–15.5)
WBC: 8 10*3/uL (ref 4.0–10.5)
nRBC: 0 % (ref 0.0–0.2)

## 2019-09-04 LAB — PHOSPHORUS: Phosphorus: 5.6 mg/dL — ABNORMAL HIGH (ref 2.5–4.6)

## 2019-09-04 MED ORDER — ADULT MULTIVITAMIN W/MINERALS CH
1.0000 | ORAL_TABLET | Freq: Every day | ORAL | Status: DC
  Administered 2019-09-04 – 2019-09-09 (×6): 1 via ORAL
  Filled 2019-09-04 (×6): qty 1

## 2019-09-04 MED ORDER — VITAL 1.5 CAL PO LIQD
1100.0000 mL | ORAL | Status: DC
  Filled 2019-09-04 (×2): qty 2000

## 2019-09-04 MED ORDER — ZOLPIDEM TARTRATE 5 MG PO TABS
5.0000 mg | ORAL_TABLET | Freq: Every day | ORAL | Status: DC
  Administered 2019-09-04 – 2019-09-09 (×6): 5 mg via ORAL
  Filled 2019-09-04 (×6): qty 1

## 2019-09-04 MED ORDER — JUVEN PO PACK
1.0000 | PACK | Freq: Two times a day (BID) | ORAL | Status: DC
  Administered 2019-09-04 – 2019-09-09 (×10): 1 via ORAL
  Filled 2019-09-04 (×9): qty 1

## 2019-09-04 NOTE — Progress Notes (Signed)
Speech Language Pathology Daily Session Note  Patient Details  Name: [name] MRN: [id number] Date of Birth: 07-May-1961  Today's Date: 09/04/2019 SLP Individual Time: 0800-0915 SLP Individual Time Calculation (min): 75 min  Short Term Goals: Week 1: SLP Short Term Goal 1 (Week 1): Pt will consume dys 3 textures and thin liquid diet with mod I use of swallow strategies and no overt s/s aspiration. SLP Short Term Goal 1 - Progress (Week 1): Discontinued (comment)(Not appropriate at this time) SLP Short Term Goal 2 (Week 1): Pt will consume regular texture trials with appropriate mastication and oral clearnce in x3 sessions prior to soild upgrade with no overt s/s aspiration. SLP Short Term Goal 2 - Progress (Week 1): Discontinued (comment)(not appropriate at this time) SLP Short Term Goal 3 (Week 1): Pt will paricipate in assessment of higher level cognitive and executive function skills. SLP Short Term Goal 3 - Progress (Week 1): Met SLP Short Term Goal 4 (Week 1): Pt will complete 3 sets of 10 RMT exercises to increase respiratory support for speech and swallowing. SLP Short Term Goal 5 (Week 1): Pt will consume current diet with minimal overt s/s of aspiration or dysphagia. SLP Short Term Goal 6 (Week 1): Pt will increase vocal intensity at the sentence level during 75% of production with Min A cues for use of respiratory support strategies.  Skilled Therapeutic Interventions:  Skilled treatment session focused on completion of cognition testing and extensive education on multiple factors impacting pt's consumption of POs, vocal quality and overall recovery of confidence and independence as it relates to PO intake. SLP received pt upright in bed with call light on. Pt states that he had been waiting for nursing as he has independently used the urinal but was not able to empty it. He was also very poorly positioned and requested to repositioned to brush his teeth. SLP assisted pt with  management of urinal and also repositioned pt upright in bed with bed in chair position. Of note, in pt's MBS report it was recommended that bed be in chair position d/t inability to get air-mattress fully upright. Education provided to pt on button to press and information posted in pt's room for nursing to facilitate better positioning. Pt stated that positioning impacted his consumption this morning.   SLP facilitated session by completing cognitive assessment. Pt demonstrates functional cognitive abilities on Cognitive Linguistic Quick Test and as such cognition goals were not added to POC. Functionally, pt able to organize and sequence activities such as brushing his teeth and he has created strategies to compensate for physical deficits (such as keeping urinal in his bed so he can always reach it).   Pt became appropriately emotional about his physical deficits and lengthy recovery. As he cried, he nose started bleeding bright red blood from nares with Cortrak. Nursing in to assess with MD to assess later in morning and assess if removing Cortrak is indicated. Bleeding stopped and session continued. [name] was present at current time. The following topics/concerns were addressed with POC created. See below:  Pt reports that wife continues to cut dysphagia 3 food items into smaller pieces. While pt didn't have any deficits during last MBS, pt continues to experience the sensation of back of throat and pharyngeal residue when consuming food. At this time, pt's report is likely multi-factoral. Question if he is extra sensitive to Cortrak, could Cortrak accumulate some residue from PO intake that pt senses? Pt doesn't have this sensation when consuming dysphagia  2 items. Additionally, pt and wife report tramuatic choking episode during acute care stay. Pt continues to voice feeling of helplessness against choking and dependence on staff to respond in timely manner should he choke again. He also feels  helpless as he has to rely on his wife to cut Dysphagia 3 food items d/t his own physical deficits. He also reports being poorly positioned in bed when eating which only furthers his feeling of dependence.   ST and pt created plan to downgrade pt's diet to dysphagia 2 to promote increased consumption, increased ease of consumption (per pt report), establish confidence in consuming without choking, promote feeling of independence with self-feeding and information posted throughout room on position bed in chair position with PO intake. Also requested pt receive gravy on every tray.   Pt and wife with questions about speech and hoarseness. Pt reports feeling of talking with only 50% air. He indicates he feels that he doesn't have good breath support for speech. Pt is very debility from hospitalization and recommend RMT to target breath support. Additionally, RMT will also help with cough strength which in turn will restore pt's confidence in clearing in pharyngeal residue. MD gave verbal permission to target RMT. At this time, will persue RMT and if hoarseness remains, pt may benefit form ENT follow-up. Additional goals added to POC to target RMT and vocal intensity.   This Probation officer made MD aware of above plan, recieved verbal permission to begin RMT and was made aware of nose bleed.     Pt and wife to follow up with SLP after lunch tray to provide feedback on the above mentioned goals.   12:50 - This Probation officer spoke with pt and his wife. Cortrak had been removed and wife states that she "didn't have to do a thing with his meal" meaning that she didn't have to cut anything up. She sat in his room and dysphagia 2 allowed her to be his "wife" not his caregiver. This appeared to mean a lot to pt and his wife. Pt appeared proud and more confident. Pt reported 1 sensation of "choking" but managed with liquids and jello. Pt could also consume thin liquids prior to food items to moisten oral cavity since pt doesn't  always have liquid in front of him throughout the day. Pt consumed more of his meal. Will continue to follow.     Pain Pain Assessment Pain Scale: 0-10 Pain Score: 8  Pain Location: Knee Pain Orientation: Right;Left Pain Descriptors / Indicators: Aching Pain Frequency: Intermittent Pain Onset: With Activity Pain Intervention(s): Medication (See eMAR)  Therapy/Group: Individual Therapy  Meghann Landing Rutherford Nail 09/04/2019, 12:37 PM

## 2019-09-04 NOTE — Progress Notes (Addendum)
Physical Therapy Session Note  Patient Details  Name: [name] MRN: [id number] Date of Birth: 1961/06/27  Today's Date: 09/04/2019 PT Individual Time: 1450-1605 PT Individual Time Calculation (min): 75 min   Short Term Goals: Week 1:  PT Short Term Goal 1 (Week 1): Pt will transfer to [facility] with min assist PT Short Term Goal 2 (Week 1): Pt will propell WC 142ft with min assist using Hemi technique. PT Short Term Goal 3 (Week 1): Pt will initiate gait training. PT Short Term Goal 4 (Week 1): Pt will perform bed mobility with min assist  Skilled Therapeutic Interventions/Progress Updates:  Pt seated in recliner , rating pain 9/10 bil knees and L shoulder.  PT suggested that pt use call bell and ask nurse when he is allowed to have any more pain meds; Deidre Ala, RN gave him Tylenol at start of session. Pt stated that he had been sitting up in recliner, feet on floor , and was feeling so stiff and terrible.  He was sitting on Roho cushion.  Wife present.   Strengthening exs, active assistive PRN:  in sitting 15 x 1 L short arc quad knee extension, L  Resisted toe flexion, bil hip adductor squeezes against towel. Reclined in recliner: 10 x 1  R straight leg raises (L with extensor lag), R/L mass flexion/extension; 15 x 1 R//L scapular protraction with extended elbow (L with shoulder flexion around 60 degrees, to tolerance at clavicle) ; 2 x 15 cervical flexion.  PT instructed pt in diaphragmatic breathing with relaxation imagery, with fair carry over. PT instructed wife in gentle PROM bil hips for pain relief and stiffness while in recliner, reclined back, or in bed.   Scoot across in nearly supine recliner> bed, to L, slightly downhill, mod assist, using Roho to help slide pt's hips over to bed.    Pt asked PT to doff R walking boot and L PRAFO.  Pt in bed with alarm set, needs at hand, and positioned with pillows for comfort, at end of session.  Wife present. Pt rated pain 5/10 at end of  session.     Therapy Documentation Precautions:  Precautions Precautions: Fall Precaution Comments: colostomy; LUE splint; rt cam boot Required Braces or Orthoses: Splint/Cast, Other Brace Splint/Cast: lt wrist splint; cam boot rt ankle Restrictions Weight Bearing Restrictions: Yes LUE Weight Bearing: Weight bear through elbow only RLE Weight Bearing: Weight bearing as tolerated Other Position/Activity Restrictions: WBAT General:   Vital Signs: Therapy Vitals Temp: 98 F (36.7 C) Pulse Rate: 97 Resp: 18 BP: (!) 126/93 Patient Position (if appropriate): Sitting Oxygen Therapy SpO2: 97 % O2 Device: Room Air Pain: Pain Assessment Pain Scale: 0-10 Pain Score: 9 Pain Type: Acute pain Pain Location: Knee Pain Orientation: Right;Left Pain Descriptors / Indicators: Aching Pain Intervention(s): Medication (See eMAR)      Therapy/Group: Individual Therapy  Tandrea Kommer 09/04/2019, 4:08 PM

## 2019-09-04 NOTE — Progress Notes (Signed)
[place] PHYSICAL MEDICINE & REHABILITATION PROGRESS NOTE   Subjective/Complaints:  Pt asked for Ambien- slept poorly with Seroquel/Trazodone.  Had Ambien in past with good results.  Has a fear of choking- Cortrak has been bleeding from L nare profusely- enough to drip onto shirt. Also Colostomy started leaking and got into abdominal wound- nursing cleaning now.  SLP has changed diet to D2 with extra gravy- pt has been eating 50% of meals but willing to try and eat more if can remove Cortrak due to the bleeding.  Placed consult for nutrition to eval for supplements considering removing cortrak-    ROS- neg CP, SOB,  N/V/D; depressed feeling with everything that's occurring; wife back to work on Monday.   Objective:   No results found. Recent Labs    09/03/19 0339 09/04/19 0402  WBC 8.6 8.0  HGB 8.6* 8.8*  HCT 28.1* 28.8*  PLT 463* 476*   Recent Labs    09/03/19 0339 09/04/19 0402  NA 134* 132*  K 4.6 4.5  CL 97* 94*  CO2 27 25  GLUCOSE 108* 100*  BUN 21* 24*  CREATININE 0.98 1.00  CALCIUM 9.5 9.6    Intake/Output Summary (Last 24 hours) at 09/04/2019 1423 Last data filed at 09/04/2019 0900 Gross per 24 hour  Intake 2029 ml  Output 650 ml  Net 1379 ml     Physical Exam: Vital Signs Blood pressure 126/82, pulse 90, temperature 98.5 F (36.9 C), temperature source Oral, resp. rate 17, height 5\' 10"  (1.778 m), weight 93.9 kg, SpO2 97 %. Vitals reviewed and labs reviewed Constitutional: lying supine in bed; cortrak has dried and fresh blood around it- dribbled on shirt; black/dried blood around L nare as well; nursing in room, cleaning abdominal wound from colostomy drainage into abdominal wound; wife at bedside; NAD; appears flat/anxious HENT:  Head: Normocephalic and atraumatic.  +NG  Eyes: EOM are normal.  Neck: No tracheal deviation present. No thyromegaly present. Cortrak as above Respiratory: Effort normal. No respiratory distress.  GI: He  exhibits no distension.  +Ileostomy - drooling stool because bag is off; abd wound vertical- healing well- granulating great- very superficial- but stool in wound- getting cleaned immediately; - no signficant drainage Musculoskeletal:     Comments:  Bilateral knees exteremly painful with attempts at ROM--anxiety also limiting exam. Right ankle incision well healed--C/D/I. Left wrist incision C/D with steri-strips in place--yeasty odor noted.  + Left knee tenderness  Neurological: He is alert and oriented to person, place, and time.  Motor: Right upper extremity: 4 -/5 proximal distal Left upper extremity: Shoulder abduction, elbow flexion/extension 2+/5, handgrip trace Bilateral lower extremities: Hip flexion, knee extension 2/5, ankle dorsiflexion 0/5 Sensation intact light touch Dysarthria Left facial weakness  Psychiatric: Situational depression--mood brightens with talk of his family.     Assessment/Plan: 1. Functional deficits secondary to acute brainstem infarct which require 3+ hours per day of interdisciplinary therapy in a comprehensive inpatient rehab setting.  Physiatrist is providing close team supervision and 24 hour management of active medical problems listed below.  Physiatrist and rehab team continue to assess barriers to discharge/monitor patient progress toward functional and medical goals  Care Tool:  Bathing    Body parts bathed by patient: Left arm, Chest, Abdomen, Face   Body parts bathed by helper: Right arm, Front perineal area, Buttocks, Right upper leg, Left upper leg, Right lower leg, Left lower leg     Bathing assist Assist Level: Maximal Assistance - Patient 24 - 49%  Upper Body Dressing/Undressing Upper body dressing   What is the patient wearing?: Pull over shirt    Upper body assist Assist Level: Maximal Assistance - Patient 25 - 49%    Lower Body Dressing/Undressing Lower body dressing      What is the patient wearing?: Pants      Lower body assist Assist for lower body dressing: Maximal Assistance - Patient 25 - 49%     Toileting Toileting    Toileting assist Assist for toileting: Set up assist Assistive Device Comment: (staff dump urinal. Dependent with colostomy bag. )   Transfers Chair/bed transfer  Transfers assist  Chair/bed transfer activity did not occur: Safety/medical concerns  Chair/bed transfer assist level: Maximal Assistance - Patient 25 - 49%     Locomotion Ambulation   Ambulation assist   Ambulation activity did not occur: Safety/medical concerns          Walk 10 feet activity   Assist  Walk 10 feet activity did not occur: Safety/medical concerns        Walk 50 feet activity   Assist Walk 50 feet with 2 turns activity did not occur: Safety/medical concerns         Walk 150 feet activity   Assist Walk 150 feet activity did not occur: Safety/medical concerns         Walk 10 feet on uneven surface  activity   Assist Walk 10 feet on uneven surfaces activity did not occur: Safety/medical concerns         Wheelchair     Assist Will patient use wheelchair at discharge?: Yes      Wheelchair assist level: Moderate Assistance - Patient 50 - 74%      Wheelchair 50 feet with 2 turns activity    Assist        Assist Level: Moderate Assistance - Patient 50 - 74%   Wheelchair 150 feet activity     Assist  Wheelchair 150 feet activity did not occur: Safety/medical concerns       Blood pressure 126/82, pulse 90, temperature 98.5 F (36.9 C), temperature source Oral, resp. rate 17, height 5\' 10"  (1.778 m), weight 93.9 kg, SpO2 97 %.  Medical Problem List and Plan: 1.  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             -patient may not shower             -ELOS/Goals: 14-17 days/Supervision/Min A            CIR Level PT , OT  2.   Antithrombotics: -DVT/anticoagulation:  Pharmaceutical: Heparin doppler neg for DVT              -antiplatelet therapy: ASA 3. Pain Management: tylenol for mild and oxycodone for severe pain.              Monitor with increased mobility Doesn't like how pain meds make him feel  4. Mood: LCSW to follow for support and evaluation. Team to provide ego support             -antipsychotic agents: N/A.  5. Neuropsych: This patient is capable of making decisions on his own behalf. 6. Skin/Wound Care: routine pressure relief measures.  7. Fluids/Electrolytes/Nutrition: Monitor I/O. Encourage intake--he's afraid to eat due to concerns of return of abdominal pain. Change TF to nights to promote hunger.  CMP 11/26 stable  11/28- d/c Cortrak- bleeding from nare -significant amount- -not sure why, but d/w SLP- can get him to eat enough, we think we D2 thins with extra gravy and have Nutrtion see pt to add supplements- order prealbumin/albumin for Monday for labs 8. Right pontine stroke: On ASA for secondary stroke prevention.  9. Mesenteric injuries s/p Ileocecectomy, total colectomy and repair of lacerations: Foley to collect high volume out of ileostomy."liquidy stools expected given lack of lg colon  Lomotil prn 10. Fistula/leak/abscess: On Zosyn 11/15-->11/29 per recommendations.  11. Episode of SVT: Resolved with BB             Monitor with increased mobility. 12. Situational depression with insomnia: Continue Seroquel at nights.  trial of 5mg  of Melatonin tonight. As per acute care PT notes, he benefits from positive reinforcement and reframing.   11/28- will try Ambien has worked in Acute; asking by wife and pt- hopefully primary team OK with change, since not sleeping and frazzled today 13. Right knee pain: Question MRI for workup.  14. ORIF left clavicle and left distal radius fracture: NWB left wrist--may WB thorough elbow with platform walker. 15.  IM nailing right femur and ORIF right  bimalleolar Fx: WBAT with CAM boot.  16. Anemia: 11/26 CBC stable     LOS: 3 days A FACE TO FACE EVALUATION WAS PERFORMED  [name] 09/04/2019, 2:23 PM

## 2019-09-04 NOTE — Progress Notes (Signed)
Occupational Therapy Session Note  Patient Details  Name: [name] MRN: [id number] Date of Birth: 1961/04/22  Today's Date: 09/04/2019 OT Individual Time: 1000-1100 OT Individual Time Calculation (min): 60 min    Short Term Goals: Week 1:  OT Short Term Goal 1 (Week 1): Pt will perform UB dressing with min A overall. OT Short Term Goal 2 (Week 1): Pt will perform LB dressing with mod A overall. OT Short Term Goal 3 (Week 1): Pt will perform toilet transfer with mod A overall.  Skilled Therapeutic Interventions/Progress Updates:    Pt received in bed and agreeable to therapy. His wife was present and actively participating. Shorts doffed and donned for pt from bed with pt actively A by actively moving his body as much as possible, using active knee flexion and pushing down through L foot to partially bridge hips.  Slid legs around to side of bed with mod A and then he was able to push up with his R arm.  Sat EOB with S.   Used slide board to transfer to his R side from bed to recliner with mod A. Cues for pt to lean more to his L to push his hips to the R. Pt using L leg and R arm to help him slide.  Once in recliner, he doffed shirt with cues, washed UB with min A, donned shirt with mod A.   From recliner, worked on AROM of LEs with sliding feet back and forth on pillow case on floor.  Encouraged wife to help him do this in between therapy sessions.  Pt resting in recliner with spouse with him in the room.    Therapy Documentation Precautions:  Precautions Precautions: Fall Precaution Comments: colostomy; LUE splint; rt cam boot Required Braces or Orthoses: Splint/Cast, Other Brace Splint/Cast: lt wrist splint; cam boot rt ankle Restrictions Weight Bearing Restrictions: Yes LUE Weight Bearing: Weight bear through elbow only RLE Weight Bearing: Weight bearing as tolerated Other Position/Activity Restrictions: WBAT Therapy Vitals Pulse Rate: 90 BP: 126/82 Pain: Pain  Assessment Pain Scale: 0-10 Pain Score: 8  Pain Location: Knee Pain Orientation: Right;Left Pain Descriptors / Indicators: Aching Pain Frequency: Intermittent Pain Onset: With Activity Pain Intervention(s): Medication (See eMAR)     Therapy/Group: Individual Therapy  [place] 09/04/2019, 1:03 PM
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
## 2019-09-05 ENCOUNTER — Inpatient Hospital Stay (HOSPITAL_COMMUNITY): Payer: 59 | Admitting: Physical Therapy

## 2019-09-05 ENCOUNTER — Inpatient Hospital Stay (HOSPITAL_COMMUNITY): Payer: 59

## 2019-09-05 MED ORDER — SODIUM CHLORIDE 0.9 % IV SOLN
INTRAVENOUS | Status: DC | PRN
  Administered 2019-09-05: 250 mL via INTRAVENOUS

## 2019-09-05 NOTE — Progress Notes (Signed)
Occupational Therapy Session Note  Patient Details  Name: [name] MRN: [id number] Date of Birth: 09/26/1961  Today's Date: 09/05/2019 OT Individual Time: 1030-1130 OT Individual Time Calculation (min): 60 min    Short Term Goals: Week 1:  OT Short Term Goal 1 (Week 1): Pt will perform UB dressing with min A overall. OT Short Term Goal 2 (Week 1): Pt will perform LB dressing with mod A overall. OT Short Term Goal 3 (Week 1): Pt will perform toilet transfer with mod A overall.  Skilled Therapeutic Interventions/Progress Updates:    Pt received in w/c with c/o stiffness in his L shoulder. Pt requesting to change shorts into boxers. Reacher obtained and pt instructed in use. [name] present throughout session and supportive. Pt cued in use of lateral leans to doff shorts sitting in w/c. Pt able to lean L with elbow on bed to achieve enough hip flexion to pull down shorts. Pt required assistance to lean R d/t needing to also use R hand to pull down. Pt cued on technique and given encouragement. Pt stating throughout session that he "cannot believe how weak he is", encouragement and realistic expectations discussed. Pt used reacher to doff pants and then to don as well, requiring mod A overall to don. Pt able to laterally lean again to get boxers on. Pt then completed L UE gravity eliminated AROM in all planes to reduce stiffness and promote muscle strength. Pt requested to return to bed at end of session. Slideboard positioned and pt able to transfer (slight downhill) and toward R side with CGA. Min A to return supine. Pt was left supine with all needs met.   Therapy Documentation Precautions:  Precautions Precautions: Fall Precaution Comments: colostomy; LUE splint; rt cam boot Required Braces or Orthoses: Splint/Cast, Other Brace Splint/Cast: lt wrist splint; cam boot rt ankle Restrictions Weight Bearing Restrictions: Yes LUE Weight Bearing: Weight bear through elbow only RLE Weight  Bearing: Weight bearing as tolerated Other Position/Activity Restrictions: WBAT   Therapy/Group: Individual Therapy   H  09/05/2019, 7:24 AM  

## 2019-09-05 NOTE — Progress Notes (Signed)
11/28 output of ostomy bag not excessive, 11/29 changed bag style from high output/foley bag to standard [name] [id number] 2 piece. Gave patient and wife opportunity to open and close bag, demonstrated application of bag.

## 2019-09-05 NOTE — Progress Notes (Signed)
Physical Therapy Session Note  Patient Details  Name: [name] MRN: [id number] Date of Birth: May 16, 1961  Today's Date: 09/05/2019 PT Individual Time: 0900-0956 PT Individual Time Calculation (min): 56 min   Short Term Goals: Week 1:  PT Short Term Goal 1 (Week 1): Pt will transfer to [facility] with min assist PT Short Term Goal 2 (Week 1): Pt will propell WC 167ft with min assist using Hemi technique. PT Short Term Goal 3 (Week 1): Pt will initiate gait training. PT Short Term Goal 4 (Week 1): Pt will perform bed mobility with min assist  Skilled Therapeutic Interventions/Progress Updates:   Pt in supine and agreeable to therapy, pain 5/10 in R lower leg/knee (premedicated). Supine>sit w/ min assist. Total assist to don RLE CAM boot. Slide board transfer to w/c w/ min assist and verbal/tactile cues for technique including BUE placement and head/hips relationship. Total assist w/c transport to therapy gym. Educated pt about use of LAFO for gastroc stretching and DF assist, however pt does have active DF/PF activation. DF ROM appears limited 2/2 gastroc tightness. Donned LAFO and L shoe and pt reports this feels better to him than when wearing PRAFO boot. Will need to clarify order w/ MD as [facility] boot ordered to be switched sides every shift and CAM boot also listed as bilateral although pt and wife state he has only ever needed R CAM boot and has only 1 in room.   Set-up L PFRW and made multiple attempts at standing using various techniques and RUE placement. Max-total assist from therapist to boost. However once pt put weight over BLEs, unable to stand 2/2 RLE pain in WB. Verbal cues for BLE placement and for anterior trunk lean. Pt limited to 90 deg knee flexion bilaterally. Pt very discouraged by this but educated on progression of recovery and knowing that there will be "good" and "bad" days. Pt verbalized understanding, wife present at end of session and also providing encouragement. Pt able  to self-propel w/c back towards room w/ RUE and LLE w/ supervision x50' w/ verbal and visual cues for technique. Returned to room remainder of way, total assist and ended session in w/c, all needs in reach. Pt agreeable to stay up for OT in 30 min.   Therapy Documentation Precautions:  Precautions Precautions: Fall Precaution Comments: colostomy; LUE splint; rt cam boot Required Braces or Orthoses: Splint/Cast, Other Brace Splint/Cast: lt wrist splint; cam boot rt ankle Restrictions Weight Bearing Restrictions: Yes LUE Weight Bearing: Weight bear through elbow only RLE Weight Bearing: Weight bearing as tolerated Other Position/Activity Restrictions: WBAT Vital Signs: Therapy Vitals Pulse Rate: 99 BP: (!) 132/93 Pain: Pain Assessment Pain Scale: 0-10 Pain Score: 3  Pain Type: Acute pain Pain Location: Knee Pain Orientation: Left;Right Pain Descriptors / Indicators: Aching Pain Frequency: Intermittent Pain Onset: With Activity Pain Intervention(s): Medication (See eMAR)(pretreat therapy)  Therapy/Group: Individual Therapy  Hamzah Savoca Clent Demark 09/05/2019, 9:58 AM

## 2019-09-05 NOTE — Progress Notes (Signed)
Patient had a good night last night, he slept well. All meds given as ordered except Boost nutritional supplement. Patient still had the boost from last shift. Ileostomy was pretty active, but the drainage system that was set up with the drainage going to the urine collection bag was not draining. There were small clumps in his feces & it kept clogging the tubing. It was irrigated multiple times, but finally disconnected the tubing & emptied the bag into a cylinder. No c/o pain this morning. IV ABT is infusing at this time via PICC line to the right upper extremity. No acute distress noted.

## 2019-09-05 NOTE — Progress Notes (Signed)
[place] PHYSICAL MEDICINE & REHABILITATION PROGRESS NOTE   Subjective/Complaints:  Pt reports feeling much better today- slept great last night- doesn't feel "frazzled" like was yesterday- although felt like PT caused some setbacks in his mindset this AM.  Was tearful after PT in gym. But happy cortrak is out and said swallowing is easier with it out.  Also notes, like tramadol- not sedating/mind altering.  ROS- neg CP, SOB,  N/V/D; depressed feeling with everything that's occurring still; wife back to work on Monday.   Objective:   No results found. Recent Labs    09/03/19 0339 09/04/19 0402  WBC 8.6 8.0  HGB 8.6* 8.8*  HCT 28.1* 28.8*  PLT 463* 476*   Recent Labs    09/03/19 0339 09/04/19 0402  NA 134* 132*  K 4.6 4.5  CL 97* 94*  CO2 27 25  GLUCOSE 108* 100*  BUN 21* 24*  CREATININE 0.98 1.00  CALCIUM 9.5 9.6    Intake/Output Summary (Last 24 hours) at 09/05/2019 1313 Last data filed at 09/05/2019 0845 Gross per 24 hour  Intake 230 ml  Output 1550 ml  Net -1320 ml     Physical Exam: Vital Signs Blood pressure (!) 132/93, pulse 99, temperature 98.7 F (37.1 C), temperature source Oral, resp. rate 16, height 5\' 10"  (1.778 m), weight 95.3 kg, SpO2 99 %. Vitals reviewed and labs reviewed Constitutional: up in manual w/c, wife at side; with PT; cortrak out; looks MUCH better; good color,  NAD;  appears flat/tearful after therapy when saw 2nd time HENT:  Head: Normocephalic and atraumatic.  +NG  Eyes: EOM are normal.  Neck: No tracheal deviation present. No thyromegaly present. Cortrak as above Respiratory: Effort normal. No respiratory distress.  GI: He exhibits no distension.  +Ileostomy -NT, ND Musculoskeletal:     Comments:  Bilateral knees exteremly painful with attempts at ROM--anxiety also limiting exam. Right ankle incision well healed--C/D/I. Left wrist incision C/D with steri-strips in place--yeasty odor noted.  + Left knee tenderness   Neurological: He is alert and oriented to person, place, and time.  Motor: Right upper extremity: 4 -/5 proximal distal Left upper extremity: Shoulder abduction, elbow flexion/extension 2+/5, handgrip trace Bilateral lower extremities: Hip flexion, knee extension 2/5, ankle dorsiflexion 0/5 Sensation intact light touch Dysarthria Left facial weakness  Psychiatric: Situational depression--mood brightens with talk of his family.     Assessment/Plan: 1. Functional deficits secondary to acute brainstem infarct which require 3+ hours per day of interdisciplinary therapy in a comprehensive inpatient rehab setting.  Physiatrist is providing close team supervision and 24 hour management of active medical problems listed below.  Physiatrist and rehab team continue to assess barriers to discharge/monitor patient progress toward functional and medical goals  Care Tool:  Bathing    Body parts bathed by patient: Left arm, Chest, Abdomen, Face   Body parts bathed by helper: Right arm, Front perineal area, Buttocks, Right upper leg, Left upper leg, Right lower leg, Left lower leg     Bathing assist Assist Level: Maximal Assistance - Patient 24 - 49%     Upper Body Dressing/Undressing Upper body dressing   What is the patient wearing?: Pull over shirt    Upper body assist Assist Level: Maximal Assistance - Patient 25 - 49%    Lower Body Dressing/Undressing Lower body dressing      What is the patient wearing?: Pants     Lower body assist Assist for lower body dressing: Maximal Assistance - Patient 25 -  49%     Toileting Toileting    Toileting assist Assist for toileting: Independent with assistive device Assistive Device Comment: urinal   Transfers Chair/bed transfer  Transfers assist  Chair/bed transfer activity did not occur: Safety/medical concerns  Chair/bed transfer assist level: Minimal Assistance - Patient > 75%     Locomotion Ambulation   Ambulation  assist   Ambulation activity did not occur: Safety/medical concerns          Walk 10 feet activity   Assist  Walk 10 feet activity did not occur: Safety/medical concerns        Walk 50 feet activity   Assist Walk 50 feet with 2 turns activity did not occur: Safety/medical concerns         Walk 150 feet activity   Assist Walk 150 feet activity did not occur: Safety/medical concerns         Walk 10 feet on uneven surface  activity   Assist Walk 10 feet on uneven surfaces activity did not occur: Safety/medical concerns         Wheelchair     Assist Will patient use wheelchair at discharge?: Yes Type of Wheelchair: Manual    Wheelchair assist level: Supervision/Verbal cueing Max wheelchair distance: 70'    Wheelchair 50 feet with 2 turns activity    Assist        Assist Level: Supervision/Verbal cueing   Wheelchair 150 feet activity     Assist  Wheelchair 150 feet activity did not occur: Safety/medical concerns       Blood pressure (!) 132/93, pulse 99, temperature 98.7 F (37.1 C), temperature source Oral, resp. rate 16, height 5\' 10"  (1.778 m), weight 95.3 kg, SpO2 99 %.  Medical Problem List and Plan: 1.  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             -patient may not shower             -ELOS/Goals: 14-17 days/Supervision/Min A            CIR Level PT , OT  2.  Antithrombotics: -DVT/anticoagulation:  Pharmaceutical: Heparin doppler neg for DVT              -antiplatelet therapy: ASA 3. Pain Management: tylenol for mild and oxycodone for severe pain.              Monitor with increased mobility Doesn't like how pain meds make him feel  4. Mood: LCSW to follow for support and evaluation. Team to provide ego support             -antipsychotic agents: N/A.  5. Neuropsych: This patient is capable of making decisions on his own behalf. 6. Skin/Wound  Care: routine pressure relief measures.  7. Fluids/Electrolytes/Nutrition: Monitor I/O. Encourage intake--he's afraid to eat due to concerns of return of abdominal pain. Change TF to nights to promote hunger.              CMP 11/26 stable  11/28- d/c Cortrak- bleeding from nare -significant amount- -not sure why, but d/w SLP- can get him to eat enough, we think we D2 thins with extra gravy and have Nutrtion see pt to add supplements- order prealbumin/albumin for Monday for labs 8. Right pontine stroke: On ASA for secondary stroke prevention.  9. Mesenteric injuries s/p Ileocecectomy, total colectomy and repair of lacerations: Foley to collect high volume out of ileostomy."liquidy stools expected given lack of  lg colon  Lomotil prn 10. Fistula/leak/abscess: On Zosyn 11/15-->11/29 per recommendations.  11. Episode of SVT: Resolved with BB             Monitor with increased mobility. 12. Situational depression with insomnia: Continue Seroquel at nights.  trial of 5mg  of Melatonin tonight. As per acute care PT notes, he benefits from positive reinforcement and reframing.   11/28- will try Ambien has worked in Acute; asking by wife and pt- hopefully primary team OK with change, since not sleeping and frazzled today 13. Right knee pain: Question MRI for workup.  14. ORIF left clavicle and left distal radius fracture: NWB left wrist--may WB thorough elbow with platform walker. 15.  IM nailing right femur and ORIF right bimalleolar Fx: WBAT with CAM boot.   Verified with PT doesn't need CAM boot on L side as well as R side.  16. Anemia: 11/26 CBC stable     LOS: 4 days A FACE TO FACE EVALUATION WAS PERFORMED  [name] 09/05/2019, 1:13 PM

## 2019-09-05 NOTE — IPOC Note (Signed)
Overall Plan of Care [facility]) Patient Details Name: [name] MRN: [id number] DOB: 1961-08-19  Admitting Diagnosis: Brainstem infarct, acute [facility])  Hospital Problems: Principal Problem:   Brainstem infarct, acute ([place]) Active Problems:   CVA (cerebral vascular accident) [facility])     Functional Problem List: Nursing Bladder, Bowel, Endurance, Motor, Pain, Nutrition, Skin Integrity  PT Balance, Behavior, Edema, Endurance, Nutrition, Motor, Pain, Perception, Safety, Sensory, Skin Integrity  OT Balance, Behavior, Endurance, Pain, Motor, Safety, Sensory  SLP    TR         Basic ADL's: OT Grooming, Bathing, Dressing, Toileting, Eating     Advanced  ADL's: OT       Transfers: PT Bed Mobility, Bed to Chair, Car, [employer], Floor  OT Toilet     Locomotion: PT Ambulation, Emergency planning/management officer, Stairs     Additional Impairments: OT None  SLP Swallowing, Social Cognition   Problem Solving  TR      Anticipated Outcomes Item Anticipated Outcome  Self Feeding    Swallowing  Mod I   Basic self-care  S - min A  Toileting  min A   Bathroom Transfers min A  Bowel/Bladder  Pt will manage bowel and bladder with mod assist  Transfers  supervision assist with LRAD  Locomotion  Supervision assist WC mobility. min assist ambulation for short distances.  Communication     Cognition  Mod I  Pain  Pt will manage pain at 3 or less on a scale of 0-10.  Safety/Judgment  Pt will remain free of falls with injury while in rehab with min assist   Therapy Plan: PT Intensity: Minimum of 1-2 x/day ,45 to 90 minutes PT Frequency: 5 out of 7 days PT Duration Estimated Length of Stay: 19-21 days OT Intensity: Minimum of 1-2 x/day, 45 to 90 minutes OT Frequency: 5 out of 7 days OT Duration/Estimated Length of Stay: 19- 21 days SLP Intensity: Minumum of 1-2 x/day, 30 to 90 minutes SLP Frequency: 3 to 5 out of 7 days SLP Duration/Estimated Length of Stay: 2-3 weeks   Due to the  current state of emergency, patients may not be receiving their 3-hours of Medicare-mandated therapy.   Team Interventions: Nursing Interventions Patient/Family Education, Bladder Management, Bowel Management, Medication Management, Disease Management/Prevention, Discharge Planning, Pain Management, Skin Care/Wound Management, Dysphagia/Aspiration Precaution Training  PT interventions Discharge planning, Ambulation/gait training, Psychosocial support, Visual/perceptual remediation/compensation, Therapeutic Activities, Functional mobility training, Balance/vestibular training, Skin care/wound management, Disease management/prevention, Neuromuscular re-education, Therapeutic Exercise, Wheelchair propulsion/positioning, UE/LE Strength taining/ROM, Splinting/orthotics, Pain management, DME/adaptive equipment instruction, Cognitive remediation/compensation, Community reintegration, Functional electrical stimulation, Patient/family education, IT trainer, UE/LE Coordination activities  OT Interventions Training and development officer, Engineer, drilling, Patient/family education, Therapeutic Activities, Wheelchair propulsion/positioning, Cognitive remediation/compensation, Functional electrical stimulation, Therapeutic Exercise, Psychosocial support, Self Care/advanced ADL retraining, UE/LE Strength taining/ROM, Functional mobility training, Community reintegration, Discharge planning, Neuromuscular re-education, UE/LE Coordination activities, Pain management, Visual/perceptual remediation/compensation  SLP Interventions Dysphagia/aspiration precaution training, Cognitive remediation/compensation, Cueing hierarchy, Functional tasks  TR Interventions    SW/CM Interventions Discharge Planning, Psychosocial Support, Patient/Family Education   Barriers to Discharge MD  Medical stability, Home enviroment access/loayout, Incontinence, Neurogenic bowel and bladder, Lack of/limited family support,  Medication compliance and Nutritional means  Nursing Medical stability, Nutrition means, Wound Care    PT Inaccessible home environment, Decreased caregiver support, Medical stability, Home environment access/layout, Incontinence, Wound Care, Medication compliance    OT (none known at this time)    SLP      SW  Team Discharge Planning: Destination: PT-Home ,OT- Home , SLP-Home Projected Follow-up: PT-Home health PT, OT-  Home health OT, 24 hour supervision/assistance, SLP-None Projected Equipment Needs: PT-Rolling walker with 5" wheels, Other (comment), Wheelchair (measurements), Wheelchair cushion (measurements), OT- To be determined, SLP-None recommended by SLP Equipment Details: PT-PFRW, OT-  Patient/family involved in discharge planning: PT- Patient, Family member/caregiver,  OT-Patient, SLP-Patient  MD ELOS: 19-21 days Medical Rehab Prognosis:  Good Assessment: Pt is a 58 yr old male with acute brainstem infarct after  Polytrauma- has ileoceccectomy and total colectomy; vertical abd incision; Cortrak removed due to bleeding; IM nailing R femur, ORIF R bimalleolar fx WBAT with CAM boot; ORIF L calvacle and L distal radius fx- NWB through wrist  Goals supervision to min assist in 19-21 days   See Team Conference Notes for weekly updates to the plan of care

## 2019-09-05 NOTE — Progress Notes (Signed)
Physical Therapy Session Note  Patient Details  Name: [name] MRN: [id number] Date of Birth: [birth year]  Today's Date: 09/05/2019 PT Individual Time: [id number] PT Individual Time Calculation (min): 65 min   Short Term Goals: Week 1:  PT Short Term Goal 1 (Week 1): Pt will transfer to [facility] with min assist PT Short Term Goal 2 (Week 1): Pt will propell WC 174ft with min assist using Hemi technique. PT Short Term Goal 3 (Week 1): Pt will initiate gait training. PT Short Term Goal 4 (Week 1): Pt will perform bed mobility with min assist  Skilled Therapeutic Interventions/Progress Updates:    Pt received supine in bed with his wife present and pt agreeable to therapy session. Supine>sit, HOB slightly elevated and using bedrails with min/mod assist for LE management and trunk upright. Sitting EOB donned R LE CAM boot total assist. R lateral scoot transfer EOB>w/c using transfer board with max cuing for sequencing and min assist for proper board placement and min assist for scooting hips. Therapist educated pt/wife on how to don w/c leg rests.  Transported to gym in w/c. R lateral scoot transfer w/c>EOM using transfer board with pt demonstrating some carryover of education on proper board placement with min assist and min cuing and min assist for scooting hips. Seated EOM performed B LE seated heel slides focusing on increased knee flexion ROM x10 reps each as pt's knee flexion is limited to ~90degrees bilaterally. Sit>stand elevated EOM>eva walker with mod assist for lifting into standing x4 trials throughout session. Gait training ~52ft x 3 with mod assist and +2 assist for directing [place] walker and bringing w/c behind patient - demonstrates increased forward trunk flexion, sustained B LE knee flexion during stance (R LE more pronounced), and decreased B LE step length and foot clearance - cuing throughout for improvements. After 2nd walk pt became very emotional having tears of joy regarding his  progress to date - therapist provided emotional support and brought his wife to therapy gym for pt comfort. Between each bout of ambulation pt performed R lateral scoot transfer w/c>EOM using transfer board with pt progressively demonstrating improved ability to place board for transfer and requiring min assist for scooting hips - cuing for improved head/hips relationship and increased use of B LEs to lift hips while scooting, though pt still significantly compensating by pulling with his R arm to slide his hips over to the mat. Transported back to room in w/c. R lateral scoot transfer w/c>EOB using transfer board with min assist for board placement and scooting hips. Sit>supine with mod assist for B LE management. Pt therapeutically positioned in bed for pressure relief and left with needs in reach and his wife present.   Therapy Documentation Precautions:  Precautions Precautions: Fall Precaution Comments: colostomy; LUE splint; rt cam boot Required Braces or Orthoses: Splint/Cast, Other Brace Splint/Cast: lt wrist splint; cam boot rt ankle Restrictions Weight Bearing Restrictions: Yes LUE Weight Bearing: Weight bear through elbow only RLE Weight Bearing: Weight bearing as tolerated Other Position/Activity Restrictions: WBAT  Pain: Reports pain in B LE knees primarily due to lack of full knee flexion ROM - repositioned and provided rest breaks for pain management.    Therapy/Group: Individual Therapy  Tawana Scale, PT, DPT 09/05/2019, 3:22 PM

## 2019-09-06 ENCOUNTER — Inpatient Hospital Stay (HOSPITAL_COMMUNITY): Payer: 59

## 2019-09-06 ENCOUNTER — Encounter (HOSPITAL_COMMUNITY): Payer: 59 | Admitting: Psychology

## 2019-09-06 ENCOUNTER — Inpatient Hospital Stay (HOSPITAL_COMMUNITY): Payer: 59 | Admitting: Physical Therapy

## 2019-09-06 ENCOUNTER — Inpatient Hospital Stay (HOSPITAL_COMMUNITY): Payer: 59 | Admitting: Occupational Therapy

## 2019-09-06 DIAGNOSIS — F418 Other specified anxiety disorders: Secondary | ICD-10-CM

## 2019-09-06 LAB — COMPREHENSIVE METABOLIC PANEL
ALT: 36 U/L (ref 0–44)
AST: 21 U/L (ref 15–41)
Albumin: 2.4 g/dL — ABNORMAL LOW (ref 3.5–5.0)
Alkaline Phosphatase: 147 U/L — ABNORMAL HIGH (ref 38–126)
Anion gap: 10 (ref 5–15)
BUN: 26 mg/dL — ABNORMAL HIGH (ref 6–20)
CO2: 26 mmol/L (ref 22–32)
Calcium: 9.6 mg/dL (ref 8.9–10.3)
Chloride: 97 mmol/L — ABNORMAL LOW (ref 98–111)
Creatinine, Ser: 1.01 mg/dL (ref 0.61–1.24)
GFR calc Af Amer: >60 mL/min (ref >60)
GFR calc non Af Amer: >60 mL/min (ref >60)
Glucose, Bld: 91 mg/dL (ref 70–99)
Potassium: 4.2 mmol/L (ref 3.5–5.1)
Sodium: 133 mmol/L — ABNORMAL LOW (ref 135–145)
Total Bilirubin: 0.4 mg/dL (ref 0.3–1.2)
Total Protein: 7.1 g/dL (ref 6.5–8.1)

## 2019-09-06 LAB — CBC WITH DIFFERENTIAL/PLATELET
Abs Immature Granulocytes: 0.05 10*3/uL (ref 0.00–0.07)
Basophils Absolute: 0.1 10*3/uL (ref 0.0–0.1)
Basophils Relative: 1 %
Eosinophils Absolute: 0.4 10*3/uL (ref 0.0–0.5)
Eosinophils Relative: 6 %
HCT: 27.6 % — ABNORMAL LOW (ref 39.0–52.0)
Hemoglobin: 8.6 g/dL — ABNORMAL LOW (ref 13.0–17.0)
Immature Granulocytes: 1 %
Lymphocytes Relative: 28 %
Lymphs Abs: 1.8 10*3/uL (ref 0.7–4.0)
MCH: 30.6 pg (ref 26.0–34.0)
MCHC: 31.2 g/dL (ref 30.0–36.0)
MCV: 98.2 fL (ref 80.0–100.0)
Monocytes Absolute: 0.9 10*3/uL (ref 0.1–1.0)
Monocytes Relative: 15 %
Neutro Abs: 3.1 10*3/uL (ref 1.7–7.7)
Neutrophils Relative %: 49 %
Platelets: 413 10*3/uL — ABNORMAL HIGH (ref 150–400)
RBC: 2.81 MIL/uL — ABNORMAL LOW (ref 4.22–5.81)
RDW: 16.6 % — ABNORMAL HIGH (ref 11.5–15.5)
WBC: 6.3 10*3/uL (ref 4.0–10.5)
nRBC: 0 % (ref 0.0–0.2)

## 2019-09-06 LAB — PREALBUMIN: Prealbumin: 30.6 mg/dL (ref 18–38)

## 2019-09-06 MED ORDER — PRO-STAT SUGAR FREE PO LIQD
30.0000 mL | Freq: Two times a day (BID) | ORAL | Status: DC
  Administered 2019-09-06 – 2019-09-08 (×5): 30 mL via ORAL
  Filled 2019-09-06 (×8): qty 30
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
## 2019-09-06 NOTE — Progress Notes (Addendum)
Physical Therapy Session Note  Patient Details  Name: KRYSTOPHER SHORTSLEEVE MRN: [id number] Date of Birth: 06-07-1961  Today's Date: 09/06/2019 PT Individual Time: 0803-0920 PT Individual Time Calculation (min): 77 min   Short Term Goals: Week 1:  PT Short Term Goal 1 (Week 1): Pt will transfer to [facility] with min assist PT Short Term Goal 2 (Week 1): Pt will propell WC 123ft with min assist using Hemi technique. PT Short Term Goal 3 (Week 1): Pt will initiate gait training. PT Short Term Goal 4 (Week 1): Pt will perform bed mobility with min assist  Skilled Therapeutic Interventions/Progress Updates: Pt presented in bed with nsg present agreeable to therapy. Pt stating some mild pain in L knee but just received pain meds. Pt performed supine to sit to R side of bed with minA and use of bed features. Once at EOB PTA donned R CAM boot and L shoe with AFO. Pt performed SB transfer to L with CGA to level w/c. Pt transported to rehab gym for energy conservation. Performed SB transfer to mat CGA to R. Performed AROM knee flexion sitting EOM with towel under shoe on L x 10. Pt then placed towel under CAM boot and performed hip abd/add x 10 on RLE. Pt stating increased back soreness, transferred to supine modA for BLE placement and participated in LTR with PTA providing minA to achieve hook lying position.Pt also performed DKC with use of physioball x 10 with PTA providing assistance and increasing knee flexion to tolerance. Once completed pt returned to sitting with minA and participated in STS x 5 with use of Eva walker. Performed from elevated mat, pt initially required modA fading to minA with repetitions. Pt was able to maintain erect posture and adjust B feet for correct positioning. On last stand pt ambulated approx 89ft with Harmon Pier walker and modA. Pt then requesting to sit due to increased fatigue. Pt transferred to w/c. Pt stating feeling "clammy", BP checked 128/95 (101). Pt transported back to room and rechecked  130/101(109), adv nsg. Pt agreeable to return to bed for recovery. Pt performed SB transfer CGA to R into bed and modA for sit to supine. BP checked 121/97 (105). Pt left with call bell within reach, bed alarm on, and hand off to Neuropsychologist Dr Sima Matas.      Therapy Documentation Precautions:  Precautions Precautions: Fall Precaution Comments: colostomy; LUE splint; rt cam boot Required Braces or Orthoses: Splint/Cast, Other Brace Splint/Cast: lt wrist splint; cam boot rt ankle Restrictions Weight Bearing Restrictions: Yes LUE Weight Bearing: Weight bear through elbow only RLE Weight Bearing: Weight bearing as tolerated Other Position/Activity Restrictions: WBAT General:   Vital Signs:  Pain: Pain Assessment Pain Score: 0-No pain   Therapy/Group: Individual Therapy  Costella Schwarz  Francia Verry, PTA  09/06/2019, 12:49 PM

## 2019-09-06 NOTE — Progress Notes (Signed)
Occupational Therapy Session Note  Patient Details  Name: [name] MRN: [id number] Date of Birth: 11-08-1960  Today's Date: 09/06/2019 OT Individual Time: 1420-1534 OT Individual Time Calculation (min): 74 min   Short Term Goals: Week 1:  OT Short Term Goal 1 (Week 1): Pt will perform UB dressing with min A overall. OT Short Term Goal 2 (Week 1): Pt will perform LB dressing with mod A overall. OT Short Term Goal 3 (Week 1): Pt will perform toilet transfer with mod A overall.  Skilled Therapeutic Interventions/Progress Updates:    Pt greeted in bed, reporting pain to be manageable for tx without medicinal interventions. Agreeable to try OOB toileting for bladder voiding. Started with pt doffing underwear and donning shorts using the reacher bedlevel. He needed A for threading R LE and also for elevating pants via rolling Rt>Lt. After Rt CAM boot was donned pt completed supine<sit towards Lt side with vcs for Lt UE WB precautions. Pt then completed slideboard<drop arm BSC with Min A. Post transfer, noted that pts ostomy bag had come open and there was stool on pt, bed, and floor. Total A for hygiene, doffing soiled clothing, and donning clean clothing. Pt able to laterally lean Lt>Rt with pillow under Lt elbow for increased support. He then transferred back to bed and returned to supine with Mod A. Able to boost himself up using the headboard with R UE once bed was placed in trendelenburg position. Pt remained in bed with all needs within reach and LEs elevated.   Pt requested for OT to throw away soiled clothing, adamant that he did not want them to be laundered by staff or family.  Therapy Documentation Precautions:  Precautions Precautions: Fall Precaution Comments: colostomy; LUE splint; rt cam boot Required Braces or Orthoses: Splint/Cast, Other Brace Splint/Cast: lt wrist splint; cam boot rt ankle Restrictions Weight Bearing Restrictions: Yes LUE Weight Bearing: Weight bear  through elbow only RLE Weight Bearing: Weight bearing as tolerated Other Position/Activity Restrictions: WBAT   Vital Signs: Therapy Vitals Temp: 98.4 F (36.9 C) Pulse Rate: 86 Resp: 18 BP: 120/88 Patient Position (if appropriate): Lying Oxygen Therapy SpO2: 98 % O2 Device: Room Air Pain:   ADL:       Therapy/Group: Individual Therapy  Chadd Tollison A Jasun Gasparini 09/06/2019, 4:04 PM

## 2019-09-06 NOTE — Progress Notes (Signed)
Speech Language Pathology Daily Session Note  Patient Details  Name: [name] MRN: [id number] Date of Birth: [birth year]  Today's Date: 09/06/2019 SLP Individual Time: 1003-1104 SLP Individual Time Calculation (min): 61 min  Short Term Goals: Week 1: SLP Short Term Goal 1 (Week 1): Pt will consume dys 3 textures and thin liquid diet with mod I use of swallow strategies and no overt s/s aspiration. SLP Short Term Goal 1 - Progress (Week 1): Discontinued (comment)(Not appropriate at this time) SLP Short Term Goal 2 (Week 1): Pt will consume regular texture trials with appropriate mastication and oral clearnce in x3 sessions prior to soild upgrade with no overt s/s aspiration. SLP Short Term Goal 2 - Progress (Week 1): Discontinued (comment)(not appropriate at this time) SLP Short Term Goal 3 (Week 1): Pt will paricipate in assessment of higher level cognitive and executive function skills. SLP Short Term Goal 3 - Progress (Week 1): Met SLP Short Term Goal 4 (Week 1): Pt will complete 3 sets of 10 RMT exercises to increase respiratory support for speech and swallowing. SLP Short Term Goal 5 (Week 1): Pt will consume current diet with minimal overt s/s of aspiration or dysphagia. SLP Short Term Goal 6 (Week 1): Pt will increase vocal intensity at the sentence level during 75% of production with Min A cues for use of respiratory support strategies.  Skilled Therapeutic Interventions:  Skilled ST services focused on swallow and speech skills. Pt requested to sit elevated in bed with support pillows verse bed in chair position during PO consumption. Pt consumed thin via cup, dys 2 snack and dys 3 snack trial. Pt demonstrated no over s/s aspiration, consuming small sips/bites and in safe PO consumption position. Pt expressed great effortful masticating and swallowing dys 3 snack trial and would prefer to continue on current diet. SLP provided education on swallow strategies for safe PO consumption  such as 90 degree position, various methods in bed, small bites/sips and consuming moist foods. Pt reported consuming 75% of breakfast and expressed need to continue increasing PO consumption. SLP facilitated RMT assessment with EMST device to increase cough effectiveness and respiratory support for vocal intensity. Pt demonstrated x10 sets of 3 set at 45cm H20, with perceived effortful level of 7 out 10. Pt was agreeable to complete exercises to increase raspatory support. Pt demonstrated vocal intensity with listener seated 6 ft away in 70% of conversation, requiring increasing cuing towards end of conversation leading to 85-90% intelligibility.  Pt was left in room with call bell within reach and bed alarm set. ST recommends to continue skilled ST services.      Pain Pain Assessment Pain Scale: 0-10 Pain Score: 5 (premedicated prior to therapy per pt req) Pain Type: Acute pain Pain Location: Generalized Pain Orientation: Right;Left Pain Descriptors / Indicators: Aching;Dull;Discomfort Pain Frequency: Constant Pain Onset: Awakened from sleep Pain Intervention(s): Medication (See eMAR);Repositioned  Therapy/Group: Individual Therapy  Tiran Sauseda  [facility] 09/06/2019, 7:53 AM

## 2019-09-06 NOTE — Progress Notes (Signed)
[place] PHYSICAL MEDICINE & REHABILITATION PROGRESS NOTE   Subjective/Complaints:    ROS- neg CP, SOB,  N/V/D; depressed feeling with everything that's occurring still; wife back to work on Monday.   Objective:   No results found. Recent Labs    09/04/19 0402 09/06/19 0304  WBC 8.0 6.3  HGB 8.8* 8.6*  HCT 28.8* 27.6*  PLT 476* 413*   Recent Labs    09/04/19 0402 09/06/19 0304  NA 132* 133*  K 4.5 4.2  CL 94* 97*  CO2 25 26  GLUCOSE 100* 91  BUN 24* 26*  CREATININE 1.00 1.01  CALCIUM 9.6 9.6    Intake/Output Summary (Last 24 hours) at 09/06/2019 [id number] Last data filed at 09/06/2019 0800 Gross per 24 hour  Intake 342.01 ml  Output 1975 ml  Net -1632.99 ml     Physical Exam: Vital Signs Blood pressure (!) 117/92, pulse 81, temperature 98 F (36.7 C), resp. rate 18, height 5\' 10"  (1.778 m), weight 95.3 kg, SpO2 100 %. Vitals reviewed and labs reviewed Constitutional: up in manual w/c, wife at side; with PT; cortrak out; looks MUCH better; good color,  NAD;  appears flat/tearful after therapy when saw 2nd time HENT:  Head: Normocephalic and atraumatic.  +NG  Eyes: EOM are normal.  Neck: No tracheal deviation present. No thyromegaly present. Cortrak as above Respiratory: Effort normal. No respiratory distress.  GI: He exhibits no distension.  +Ileostomy -NT, ND Musculoskeletal:     Comments:  Bilateral knees exteremly painful with attempts at ROM--anxiety also limiting exam. Right ankle incision well healed--C/D/I. Left wrist incision C/D with steri-strips in place--yeasty odor noted.  + Left knee tenderness  Neurological: He is alert and oriented to person, place, and time.  Motor: Right upper extremity: 4 -/5 proximal distal Left upper extremity: Shoulder abduction, elbow flexion/extension 2+/5, handgrip trace Bilateral lower extremities: Hip flexion, knee extension 2/5, ankle dorsiflexion 0/5 Sensation intact light touch Dysarthria Left facial  weakness  MSK limited Right knee flexion to 90deg, left knee to ~110deg, Left hand fingers with ~45 deg flexion at DIPs , ~60 deg at PIP and MCP Psychiatric: Situational depression--mood brightens with talk of his family.     Assessment/Plan: 1. Functional deficits secondary to acute brainstem infarct which require 3+ hours per day of interdisciplinary therapy in a comprehensive inpatient rehab setting.  Physiatrist is providing close team supervision and 24 hour management of active medical problems listed below.  Physiatrist and rehab team continue to assess barriers to discharge/monitor patient progress toward functional and medical goals  Care Tool:  Bathing    Body parts bathed by patient: Left arm, Chest, Abdomen, Face   Body parts bathed by helper: Right arm, Front perineal area, Buttocks, Right upper leg, Left upper leg, Right lower leg, Left lower leg     Bathing assist Assist Level: Maximal Assistance - Patient 24 - 49%     Upper Body Dressing/Undressing Upper body dressing   What is the patient wearing?: Pull over shirt    Upper body assist Assist Level: Maximal Assistance - Patient 25 - 49%    Lower Body Dressing/Undressing Lower body dressing      What is the patient wearing?: Pants     Lower body assist Assist for lower body dressing: Maximal Assistance - Patient 25 - 49%     Toileting Toileting    Toileting assist Assist for toileting: Independent with assistive device Assistive Device Comment: urinal   Transfers Chair/bed transfer  Transfers assist  Chair/bed transfer activity did not occur: Safety/medical concerns  Chair/bed transfer assist level: Minimal Assistance - Patient > 75%     Locomotion Ambulation   Ambulation assist   Ambulation activity did not occur: Safety/medical concerns  Assist level: 2 helpers Assistive device: Ethelene Hal Max distance: 96ft   Walk 10 feet activity   Assist  Walk 10 feet activity did not occur:  Safety/medical concerns  Assist level: 2 helpers Assistive device: Walker-Eva   Walk 50 feet activity   Assist Walk 50 feet with 2 turns activity did not occur: Safety/medical concerns         Walk 150 feet activity   Assist Walk 150 feet activity did not occur: Safety/medical concerns         Walk 10 feet on uneven surface  activity   Assist Walk 10 feet on uneven surfaces activity did not occur: Safety/medical concerns         Wheelchair     Assist Will patient use wheelchair at discharge?: Yes Type of Wheelchair: Manual    Wheelchair assist level: Supervision/Verbal cueing Max wheelchair distance: 49'    Wheelchair 50 feet with 2 turns activity    Assist        Assist Level: Supervision/Verbal cueing   Wheelchair 150 feet activity     Assist  Wheelchair 150 feet activity did not occur: Safety/medical concerns       Blood pressure (!) 117/92, pulse 81, temperature 98 F (36.7 C), resp. rate 18, height 5\' 10"  (1.778 m), weight 95.3 kg, SpO2 100 %.  Medical Problem List and Plan: 1.  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                          -ELOS/Goals: 14-17 days/Supervision/Min A            CIR Level PT , OT , SLP  2.  Antithrombotics: -DVT/anticoagulation:  Pharmaceutical: Heparin doppler neg for DVT              -antiplatelet therapy: ASA 3. Pain Management: tylenol for mild and oxycodone for severe pain.              Monitor with increased mobility Doesn't like how pain meds make him feel  4. Mood: LCSW to follow for support and evaluation. Team to provide ego support             -antipsychotic agents: N/A.  5. Neuropsych: This patient is capable of making decisions on his own behalf. 6. Skin/Wound Care: routine pressure relief measures.  7. Fluids/Electrolytes/Nutrition: Monitor I/O. Encourage intake--he's afraid to eat due to concerns of return  of abdominal pain. Change TF to nights to promote hunger.              CMP 11/26 stable   8. Right pontine stroke: On ASA for secondary stroke prevention.  9. Mesenteric injuries s/p Ileocecectomy, total colectomy and repair of lacerations: Foley to collect high volume out of ileostomy."liquidy stools expected given lack of lg colon  Lomotil prn 10. Fistula/leak/abscess: On Zosyn 11/15-->11/29 per recommendations. May be able to d/c PICC if intake ok 11. Episode of SVT: Resolved with BB             Monitor with increased mobility. 12. Situational depression with insomnia: Continue Seroquel at nights.   13. Right knee pain: Question MRI for workup.  14. ORIF left clavicle and left  distal radius fracture: NWB left wrist--may WB thorough elbow with platform walker. 15.  IM nailing right femur and ORIF right bimalleolar Fx: .  16. Anemia:likely ABLA  11/30 CBC stable 17.  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  Left MCP, PIP and DIP contracture- extention, will ask OT to perform passive flexion of fingers      LOS: 5 days A FACE TO FACE EVALUATION WAS PERFORMED  Charlett Blake 09/06/2019, 9:27 AM

## 2019-09-07 ENCOUNTER — Inpatient Hospital Stay (HOSPITAL_COMMUNITY): Payer: 59

## 2019-09-07 ENCOUNTER — Inpatient Hospital Stay (HOSPITAL_COMMUNITY): Payer: 59 | Admitting: Physical Therapy

## 2019-09-07 ENCOUNTER — Inpatient Hospital Stay (HOSPITAL_COMMUNITY): Payer: 59 | Admitting: Occupational Therapy

## 2019-09-07 MED ORDER — LORAZEPAM 2 MG/ML IJ SOLN: 0.2500 mg | INTRAMUSCULAR | Status: DC | PRN

## 2019-09-07 MED ORDER — LORAZEPAM 0.5 MG PO TABS
0.2500 mg | ORAL_TABLET | ORAL | Status: DC | PRN
  Administered 2019-09-07 – 2019-09-08 (×3): 0.25 mg via ORAL
  Filled 2019-09-07 (×3): qty 1

## 2019-09-07 NOTE — Progress Notes (Signed)
[place] PHYSICAL MEDICINE & REHABILITATION PROGRESS NOTE   Subjective/Complaints:  C/o some anal discharge, not stool but rather mucus , no bleeding appreciated   ROS- neg CP, SOB,  N/V/D; no abd pain Cont with knee pain with ROM but no swelling   Objective:   Dg Knee 1-2 Views Left  Result Date: 09/06/2019 CLINICAL DATA:  Bilateral knee contractures. EXAM: RIGHT KNEE - 1-2 VIEW; LEFT KNEE - 1-2 VIEW COMPARISON:  Radiographs dated 08/17/2019 and 08/13/2019 FINDINGS: Right knee: No fracture, dislocation, joint effusion, or appreciable arthritis. Intramedullary nail in the distal femoral shaft with 2 fixation screws in place. No change since the prior study. Left knee: No evidence of fracture, dislocation, or joint effusion. No evidence of arthropathy or other focal bone abnormality. Soft tissues are unremarkable. IMPRESSION: No significant abnormality of either knee. Electronically Signed   By: Lorriane Shire M.D.   On: 09/06/2019 13:54   Dg Knee 1-2 Views Right  Result Date: 09/06/2019 CLINICAL DATA:  Bilateral knee contractures. EXAM: RIGHT KNEE - 1-2 VIEW; LEFT KNEE - 1-2 VIEW COMPARISON:  Radiographs dated 08/17/2019 and 08/13/2019 FINDINGS: Right knee: No fracture, dislocation, joint effusion, or appreciable arthritis. Intramedullary nail in the distal femoral shaft with 2 fixation screws in place. No change since the prior study. Left knee: No evidence of fracture, dislocation, or joint effusion. No evidence of arthropathy or other focal bone abnormality. Soft tissues are unremarkable. IMPRESSION: No significant abnormality of either knee. Electronically Signed   By: Lorriane Shire M.D.   On: 09/06/2019 13:54   Recent Labs    09/06/19 0304  WBC 6.3  HGB 8.6*  HCT 27.6*  PLT 413*   Recent Labs    09/06/19 0304  NA 133*  K 4.2  CL 97*  CO2 26  GLUCOSE 91  BUN 26*  CREATININE 1.01  CALCIUM 9.6    Intake/Output Summary (Last 24 hours) at 09/07/2019 0846 Last data filed  at 09/07/2019 0830 Gross per 24 hour  Intake 390 ml  Output 1900 ml  Net -1510 ml     Physical Exam: Vital Signs Blood pressure 130/89, pulse 81, temperature 98.1 F (36.7 C), temperature source Oral, resp. rate 15, height 5\' 10"  (1.778 m), weight 95.4 kg, SpO2 100 %. Vitals reviewed and labs reviewed Constitutional: up in manual w/c, wife at side; with PT; cortrak out; looks MUCH better; good color,  NAD;  appears flat/tearful after therapy when saw 2nd time HENT:  Head: Normocephalic and atraumatic.  +NG  Eyes: EOM are normal.  Neck: No tracheal deviation present. No thyromegaly present. Cortrak as above Respiratory: Effort normal. No respiratory distress.  GI: He exhibits no distension.  +Ileostomy -NT, ND Musculoskeletal:     Comments:   + right  knee tenderness over quad tendon with PROM knee flexion No evidence of knee effusion bilaterally , no erythema around knees or ecchymosis, no jt line tenderness Neurological: He is alert and oriented to person, place, and time.  Motor: Right upper extremity: 4 -/5 proximal distal Left upper extremity: Shoulder abduction, elbow flexion/extension 2+/5, handgrip trace Bilateral lower extremities: Hip flexion, knee extension 2/5, ankle dorsiflexion 0/5 Sensation intact light touch Dysarthria Left facial weakness  MSK limited Right knee flexion to 90deg, left knee to ~110deg, Left hand fingers with ~45 deg flexion at DIPs , ~60 deg at PIP and MCP Psychiatric: Situational depression--mood brightens with talk of his family.     Assessment/Plan: 1. Functional deficits secondary to acute brainstem infarct which  require 3+ hours per day of interdisciplinary therapy in a comprehensive inpatient rehab setting.  Physiatrist is providing close team supervision and 24 hour management of active medical problems listed below.  Physiatrist and rehab team continue to assess barriers to discharge/monitor patient progress toward functional and  medical goals  Care Tool:  Bathing    Body parts bathed by patient: Left arm, Chest, Abdomen, Face   Body parts bathed by helper: Right arm, Front perineal area, Buttocks, Right upper leg, Left upper leg, Right lower leg, Left lower leg     Bathing assist Assist Level: Maximal Assistance - Patient 24 - 49%     Upper Body Dressing/Undressing Upper body dressing   What is the patient wearing?: Pull over shirt    Upper body assist Assist Level: Maximal Assistance - Patient 25 - 49%    Lower Body Dressing/Undressing Lower body dressing      What is the patient wearing?: Pants     Lower body assist Assist for lower body dressing: Maximal Assistance - Patient 25 - 49%     Toileting Toileting    Toileting assist Assist for toileting: Independent with assistive device Assistive Device Comment: urinal(has ileostomy as well/ total care with that)   Transfers Chair/bed transfer  Transfers assist  Chair/bed transfer activity did not occur: Safety/medical concerns  Chair/bed transfer assist level: Minimal Assistance - Patient > 75%     Locomotion Ambulation   Ambulation assist   Ambulation activity did not occur: Safety/medical concerns  Assist level: 2 helpers Assistive device: Ethelene Hal Max distance: 12ft   Walk 10 feet activity   Assist  Walk 10 feet activity did not occur: Safety/medical concerns  Assist level: 2 helpers Assistive device: Walker-Eva   Walk 50 feet activity   Assist Walk 50 feet with 2 turns activity did not occur: Safety/medical concerns         Walk 150 feet activity   Assist Walk 150 feet activity did not occur: Safety/medical concerns         Walk 10 feet on uneven surface  activity   Assist Walk 10 feet on uneven surfaces activity did not occur: Safety/medical concerns         Wheelchair     Assist Will patient use wheelchair at discharge?: Yes Type of Wheelchair: Manual    Wheelchair assist level:  Supervision/Verbal cueing Max wheelchair distance: 63'    Wheelchair 50 feet with 2 turns activity    Assist        Assist Level: Supervision/Verbal cueing   Wheelchair 150 feet activity     Assist  Wheelchair 150 feet activity did not occur: Safety/medical concerns       Blood pressure 130/89, pulse 81, temperature 98.1 F (36.7 C), temperature source Oral, resp. rate 15, height 5\' 10"  (1.778 m), weight 95.4 kg, SpO2 100 %.  Medical Problem List and Plan: 1.  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                          -ELOS/Goals: 14-17 days/Supervision/Min A            CIR Level PT , OT , SLP  2.  Antithrombotics: -DVT/anticoagulation:  Pharmaceutical: Heparin doppler neg for DVT              -antiplatelet therapy: ASA 3. Pain Management: tylenol for mild and oxycodone for severe pain.  Monitor with increased mobility Doesn't like how pain meds make him feel  4. Mood: LCSW to follow for support and evaluation. Team to provide ego support             -antipsychotic agents: N/A.  5. Neuropsych: This patient is capable of making decisions on his own behalf. 6. Skin/Wound Care: routine pressure relief measures.  7. Fluids/Electrolytes/Nutrition: Monitor I/O. Encourage intake--he's afraid to eat due to concerns of return of abdominal pain. Change TF to nights to promote hunger.              CMP 11/30  stable   8. Right pontine stroke: On ASA for secondary stroke prevention.  9. Mesenteric injuries s/p Ileocecectomy, total colectomy and repair of lacerations: Foley to collect high volume out of ileostomy."liquidy stools expected given lack of lg colon  Lomotil prn 10. Fistula/leak/abscess: On Zosyn 11/15-->11/29 per recommendations. May be able to d/c PICC if intake ok 11. Episode of SVT: Resolved with BB             Monitor with increased mobility. 12. Situational depression with  insomnia: Continue Seroquel at nights.   13. Right knee pain: Question MRI for workup.  14. ORIF left clavicle and left distal radius fracture: NWB left wrist--may WB thorough elbow with platform walker. 15.  IM nailing right femur and ORIF right bimalleolar Fx: .  16. Anemia:likely ABLA  11/30 CBC stable 17.  Bilateral knee contracture able to achieve near full extension , flexion is limited R>L   f/u xray showed no HO 18.  Left MCP, PIP and DIP contracture- extention, will ask OT to perform passive flexion of fingers      LOS: 6 days A FACE TO FACE EVALUATION WAS PERFORMED  Charlett Blake 09/07/2019, 8:46 AM

## 2019-09-07 NOTE — Progress Notes (Signed)
Physical Therapy Session Note  Patient Details  Name: [name] MRN: [id number] Date of Birth: 27-Nov-1960  Today's Date: 09/07/2019 PT Individual Time: 0815-0900 PT Individual Time Calculation (min): 45 min   Short Term Goals: Week 1:  PT Short Term Goal 1 (Week 1): Pt will transfer to [facility] with min assist PT Short Term Goal 2 (Week 1): Pt will propell WC 111ft with min assist using Hemi technique. PT Short Term Goal 3 (Week 1): Pt will initiate gait training. PT Short Term Goal 4 (Week 1): Pt will perform bed mobility with min assist  Skilled Therapeutic Interventions/Progress Updates:   Pt in supine and agreeable to therapy, denies pain but reports he has the sensation of needing to have a bowel movement. RN and MD both made aware. Supine>sit w/ min assist and CGA slide board transfer to w/c. Pt's shorts noted to be soiled of urine. Slide board transfer back to EOB and to supine and doffed/donned new shorts and pt performed pericare w/ wet wash cloth. Slide board back to w/c, again w/ CGA. Donned R CAM boot and LAFO/shoe. Worked on independence w/ locomotion w/ w/c mobility, self-propelled w/c w/ supervision using RUE and LLE x 50'. Total assist remainder of way 2/2 fatigue. Worked on BLE strengthening/ROM while seated. Passive knee flexion stretch 30 sec x4 bilaterally within available and pain tolerable range. BLE strengthening exercises including LAQs x20 and knee marches x20 w/ verbal and tactile cues for technique. Returned to room total assist via w/c. Ended session in w/c, all needs in reach.   Therapy Documentation Precautions:  Precautions Precautions: Fall Precaution Comments: colostomy; LUE splint; rt cam boot Required Braces or Orthoses: Splint/Cast, Other Brace Splint/Cast: lt wrist splint; cam boot rt ankle Restrictions Weight Bearing Restrictions: Yes LUE Weight Bearing: Weight bear through elbow only RLE Weight Bearing: Weight bearing as tolerated Other  Position/Activity Restrictions: WBAT Vital Signs: Therapy Vitals Temp: 98.1 F (36.7 C)(malfuction with temp prob. on other dinamap) Temp Source: Oral  Therapy/Group: Individual Therapy  [name] [name] 09/07/2019, 9:24 AM

## 2019-09-07 NOTE — Progress Notes (Signed)
Physical Therapy Session Note  Patient Details  Name: [name] MRN: [id number] Date of Birth: 06/07/1961  Today's Date: 09/07/2019 PT Individual Time: 1120-1202 PT Individual Time Calculation (min): 42 min   Short Term Goals: Week 1:  PT Short Term Goal 1 (Week 1): Pt will transfer to [facility] with min assist PT Short Term Goal 2 (Week 1): Pt will propell WC 170f with min assist using Hemi technique. PT Short Term Goal 3 (Week 1): Pt will initiate gait training. PT Short Term Goal 4 (Week 1): Pt will perform bed mobility with min assist  Skilled Therapeutic Interventions/Progress Updates: Pt presented in bed with wife present agreeable to therapy. Pt stating had a couple of BM's and stomach upset, nsg was present and aware. Pt stating some knee soreness but stomach superceding knee pain currently. Performed supine to sit on R EOB with HOB elevated and minA for moving RLE over bed rail. PTA set up slide board and perfomed SB transfer to L supervision with increased time. Pt transported to rehab gym for time management and performed SB transfer to R CGA. Performed STS from 21in mat x 3 with EHarmon Pierwalker and both hands on walker minA. Encouraged pt to attempt by pushing with RUE from mat, pt with increased anxiety thus increased to 22in height and pt was able to perform with modA and verbal cues for increasing anterior lean. Pt returned to w/c via SB with PTA setting up slide board and pt performing transfer with supervision. PTA advised pt that may be able to progress to squat pivot transfer with some anxiety noted by pt. Explained that he has made good progress past few days and he's ready to progress, pt verbalized understanding. Pt then transported to day room and participated in Cybex Kinetron 60cm/sec x 265m for increasing knee ROM and reciprocal activity. Pt transported back to room and agreeable to remain in w/c for lunch. Pt left with call bell within reach and current needs met.       Therapy  Documentation Precautions:  Precautions Precautions: Fall Precaution Comments: colostomy; LUE splint; rt cam boot Required Braces or Orthoses: Splint/Cast, Other Brace Splint/Cast: lt wrist splint; cam boot rt ankle Restrictions Weight Bearing Restrictions: Yes LUE Weight Bearing: Weight bear through elbow only RLE Weight Bearing: Weight bearing as tolerated Other Position/Activity Restrictions: WBAT General:   Vital Signs:     Therapy/Group: Individual Therapy  [name]  Anber Mckiver, PTA  09/07/2019, 12:25 PM

## 2019-09-07 NOTE — Progress Notes (Signed)
Speech Language Pathology Daily Session Note  Patient Details  Name: [name] MRN: [id number] Date of Birth: 07/10/1961  Today's Date: 09/07/2019 SLP Individual Time: 1003-1055 SLP Individual Time Calculation (min): 52 min  Short Term Goals: Week 1: SLP Short Term Goal 1 (Week 1): Pt will consume dys 3 textures and thin liquid diet with mod I use of swallow strategies and no overt s/s aspiration. SLP Short Term Goal 1 - Progress (Week 1): Discontinued (comment)(Not appropriate at this time) SLP Short Term Goal 2 (Week 1): Pt will consume regular texture trials with appropriate mastication and oral clearnce in x3 sessions prior to soild upgrade with no overt s/s aspiration. SLP Short Term Goal 2 - Progress (Week 1): Discontinued (comment)(not appropriate at this time) SLP Short Term Goal 3 (Week 1): Pt will paricipate in assessment of higher level cognitive and executive function skills. SLP Short Term Goal 3 - Progress (Week 1): Met SLP Short Term Goal 4 (Week 1): Pt will complete 3 sets of 10 RMT exercises to increase respiratory support for speech and swallowing. SLP Short Term Goal 5 (Week 1): Pt will consume current diet with minimal overt s/s of aspiration or dysphagia. SLP Short Term Goal 6 (Week 1): Pt will increase vocal intensity at the sentence level during 75% of production with Min A cues for use of respiratory support strategies.  Skilled Therapeutic Interventions:Skilled ST services focused on education and speech skills. [name] present for treatment session. Pt expressed frustration and was upset about incontinent episodes during today's pt session and requested to not participate in PO consumption trials. SLP provided education to pt's wife pertaining to ST goals on swallowing and speech, as well as strategies for safe PO consumption and to increase vocal intensity and cough effectiveness. [name] expressed concerns with anxeity and emotional outburst, uncharacteristic  of pt's baseline, SLP provided education on changes from acute CVA such as emotional liable. Pt placed meal order with kitchen staff in room and required min A verbal cues to clarify message due to low vocal intensity. Pt completed 3 sets of 10 repetitions of EMST set at 45 cm H2O, with self perceived score of 6 out 10. SLP communicated with nurse, Santiago Glad about anxiety medication and possible medication to reduce incontinent episodes, in order increase participation in skilled services. When SLP returned with a brief, pt had an incontinent episode and had pressed the call bell for help.  Pt was left in room with wife, call bell within reach and bed alarm set. ST recommends to continue skilled ST services.       Pain Pain Assessment Pain Score: 0-No pain  Therapy/Group: Individual Therapy  Maren Wiesen  [facility] 09/07/2019, 10:59 AM

## 2019-09-07 NOTE — Progress Notes (Signed)
Occupational Therapy Session Note  Patient Details  Name: [name] MRN: [id number] Date of Birth: 07-10-1961  Today's Date: 09/07/2019 OT Individual Time: 1400-1510 OT Individual Time Calculation (min): 70 min    Short Term Goals: Week 1:  OT Short Term Goal 1 (Week 1): Pt will perform UB dressing with min A overall. OT Short Term Goal 2 (Week 1): Pt will perform LB dressing with mod A overall. OT Short Term Goal 3 (Week 1): Pt will perform toilet transfer with mod A overall.  Skilled Therapeutic Interventions/Progress Updates:    Upon entering the room, pt supine in bed with wife present in the room. Pt reports feeling unwell today but agreeable to participate. Pt performed supine >sit with min A. OT assisted pt with L wrist brace removal and hand hygiene. PROM to digits of L UE this session with pt reporting some pain during flexion. Pt performed squat pivot transfer to the L with min guard and set up of equipment! Pt propelled wheelchair 50' with R UE and L LE this session before fatigued and with increased pain in stomach. OT assisted pt the rest of the way to mat. Pt performed squat pivot transfer again to L onto mat with min guard. Pt standing for 22 inches elevated mat with PFRM and mod lifting assistance. Pt ambulating 20 feet with PFRW and min A before needing to rest. Pt very excited about this accomplishment. OT assisted pt with returning back to room and pt transferred again to the L with min guard squat pivot and min A sit >supine. RN notified of need for abdominal dressing change and OT discussed home set up with pt and family. At worse, pt would need hospital bed and [facility] in order to stay on 1st floor of split level. Education and discharge planning to continue.   Therapy Documentation Precautions:  Precautions Precautions: Fall Precaution Comments: colostomy; LUE splint; rt cam boot Required Braces or Orthoses: Splint/Cast, Other Brace Splint/Cast: lt wrist splint; cam  boot rt ankle Restrictions Weight Bearing Restrictions: Yes LUE Weight Bearing: Weight bearing as tolerated(through the elbow, NWB through wrist) RLE Weight Bearing: Weight bearing as tolerated Other Position/Activity Restrictions: WBAT General:   Vital Signs: Therapy Vitals Temp: 98.1 F (36.7 C) Pulse Rate: (!) 103 Resp: 20 BP: (!) 128/98 Patient Position (if appropriate): Sitting Oxygen Therapy SpO2: 98 % O2 Device: Room Air   Therapy/Group: Individual Therapy  Gypsy Decant 09/07/2019, 4:48 PM

## 2019-09-08 ENCOUNTER — Inpatient Hospital Stay (HOSPITAL_COMMUNITY): Payer: 59 | Admitting: Physical Therapy

## 2019-09-08 ENCOUNTER — Inpatient Hospital Stay (HOSPITAL_COMMUNITY): Payer: 59

## 2019-09-08 ENCOUNTER — Inpatient Hospital Stay (HOSPITAL_COMMUNITY): Payer: 59 | Admitting: Occupational Therapy

## 2019-09-08 MED ORDER — ENSURE ENLIVE PO LIQD
237.0000 mL | Freq: Two times a day (BID) | ORAL | Status: DC
  Administered 2019-09-08 – 2019-09-09 (×3): 237 mL via ORAL

## 2019-09-08 MED ORDER — DIPHENHYDRAMINE-ZINC ACETATE 2-0.1 % EX CREA
TOPICAL_CREAM | Freq: Two times a day (BID) | CUTANEOUS | Status: DC | PRN
  Filled 2019-09-08: qty 28

## 2019-09-08 MED ORDER — ENOXAPARIN SODIUM 40 MG/0.4ML ~~LOC~~ SOLN
40.0000 mg | SUBCUTANEOUS | Status: DC
  Administered 2019-09-08 – 2019-09-09 (×2): 40 mg via SUBCUTANEOUS
  Filled 2019-09-08 (×2): qty 0.4
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
## 2019-09-08 NOTE — Progress Notes (Signed)
Physical Therapy Session Note  Patient Details  Name: [name] MRN: [id number] Date of Birth: 08-04-1961  Today's Date: 09/08/2019 PT Individual Time: 0903-1003 PT Individual Time Calculation (min): 60 min   Short Term Goals: Week 1:  PT Short Term Goal 1 (Week 1): Pt will transfer to [facility] with min assist PT Short Term Goal 2 (Week 1): Pt will propell WC 185f with min assist using Hemi technique. PT Short Term Goal 3 (Week 1): Pt will initiate gait training. PT Short Term Goal 4 (Week 1): Pt will perform bed mobility with min assist  Skilled Therapeutic Interventions/Progress Updates: Pt presented in bed agreeable to thearpy. Pt stating some discomfort due to sensation of wanting to have BM but does not require intervention at this time. Pt performed supine to sit with HOB elevated and CGA with increased time. Pt performed lateral scoot transfer to w/c with CGA (without SB). PTA set up leg rest and pt propelled w/c x 468fwith LLE and RUE. Pt limited in propulsion distance due to decreased ROM in L knee causing increased pain. Pt then propelled backwards additional 10012for quad strengthening. Pt transported remaining distance to rehab gym. Performed lateral scoot to mat and participated in STS from 22in with PFRW x 3 with modA. Pt required mod cues for increasing anterior lean to facilitate transfer. PTA then placed chair in front of pt and raised mat 1 for blocked practice STS with emphasis on increasing anterior lean. Pt was able to improve to minA with improved technique. Pt stating significant fatigue. Performed lateral scoot w/c and transported to day room. Performed lateral scoot to NuStep and pt participated in NuStep with x 3 extremities L1 x 5 min for global conditioning and increasing tolerance to knee ROM. Pt returned to w/c in same manner as transported back to room. Once in room pt stating feeling like he was going to have a BM and wished to return to bed. Performed lateral scoot to  L into bed CGA and required minA for BLE management. Pt was able to perform small scoot to allow PTA to adjust pad under pt. Pt left in bed with call bell within reach and needs met.      Therapy Documentation Precautions:  Precautions Precautions: Fall Precaution Comments: colostomy; LUE splint; rt cam boot Required Braces or Orthoses: Splint/Cast, Other Brace Splint/Cast: lt wrist splint; cam boot rt ankle Restrictions Weight Bearing Restrictions: Yes LUE Weight Bearing: Weight bearing as tolerated(through the elbow, NWB through wrist) RLE Weight Bearing: Weight bearing as tolerated Other Position/Activity Restrictions: WBAT General:   Vital Signs:  Pain: Pain Assessment Pain Score: 0-No pain    Therapy/Group: Individual Therapy  Taylynn Easton  Galo Sayed, PTA  09/08/2019, 12:56 PM

## 2019-09-08 NOTE — Progress Notes (Signed)
Social Work Patient ID: [name], male   DOB: [birth year], 59 y.o.   MRN: [id number] Met with pt and spoke with wife via telephone to discuss team conference goals supervision-min assist and target discharge 12/15. Both feel this may be too soon due to so much to do traiining and etc. Discussed he would need to be medically stable and the training would need to be completed and both comfortable with pt's care and ileostomy care. Will continue to have nursing work with pt on learning how to take care of the ileostomy, wife states:  He will need to learn I will not be there all of the time to deal with it." Will work on discharge needs and continue to have neruo-psych see for coping due to pt's anxiety-panic issues.

## 2019-09-08 NOTE — Progress Notes (Signed)
[place] PHYSICAL MEDICINE & REHABILITATION PROGRESS NOTE   Subjective/Complaints:  C/o some anal discharge mucus improving  Noticed rash on chest and upper thighs, no itching or pain , reviewed med list no new meds, now off piperacillin   ROS- neg CP, SOB,  N/V/D; no abd pain Cont with knee pain with ROM but no swelling   Objective:   Dg Knee 1-2 Views Left  Result Date: 09/06/2019 CLINICAL DATA:  Bilateral knee contractures. EXAM: RIGHT KNEE - 1-2 VIEW; LEFT KNEE - 1-2 VIEW COMPARISON:  Radiographs dated 08/17/2019 and 08/13/2019 FINDINGS: Right knee: No fracture, dislocation, joint effusion, or appreciable arthritis. Intramedullary nail in the distal femoral shaft with 2 fixation screws in place. No change since the prior study. Left knee: No evidence of fracture, dislocation, or joint effusion. No evidence of arthropathy or other focal bone abnormality. Soft tissues are unremarkable. IMPRESSION: No significant abnormality of either knee. Electronically Signed   By: Lorriane Shire M.D.   On: 09/06/2019 13:54   Dg Knee 1-2 Views Right  Result Date: 09/06/2019 CLINICAL DATA:  Bilateral knee contractures. EXAM: RIGHT KNEE - 1-2 VIEW; LEFT KNEE - 1-2 VIEW COMPARISON:  Radiographs dated 08/17/2019 and 08/13/2019 FINDINGS: Right knee: No fracture, dislocation, joint effusion, or appreciable arthritis. Intramedullary nail in the distal femoral shaft with 2 fixation screws in place. No change since the prior study. Left knee: No evidence of fracture, dislocation, or joint effusion. No evidence of arthropathy or other focal bone abnormality. Soft tissues are unremarkable. IMPRESSION: No significant abnormality of either knee. Electronically Signed   By: Lorriane Shire M.D.   On: 09/06/2019 13:54   Recent Labs    09/06/19 0304  WBC 6.3  HGB 8.6*  HCT 27.6*  PLT 413*   Recent Labs    09/06/19 0304  NA 133*  K 4.2  CL 97*  CO2 26  GLUCOSE 91  BUN 26*  CREATININE 1.01  CALCIUM 9.6     Intake/Output Summary (Last 24 hours) at 09/08/2019 0917 Last data filed at 09/08/2019 0841 Gross per 24 hour  Intake 510 ml  Output 1575 ml  Net -1065 ml     Physical Exam: Vital Signs Blood pressure (!) 130/93, pulse 88, temperature 98.4 F (36.9 C), temperature source Oral, resp. rate 15, height 5\' 10"  (1.778 m), weight 95.4 kg, SpO2 100 %. Vitals reviewed and labs reviewed Constitutional: up in manual w/c, wife at side; with PT; cortrak out; looks MUCH better; good color,  NAD;  appears flat/tearful after therapy when saw 2nd time HENT:  Head: Normocephalic and atraumatic.  +NG  Eyes: EOM are normal.  Neck: No tracheal deviation present. No thyromegaly present. Cortrak as above Respiratory: Effort normal. No respiratory distress.  GI: He exhibits no distension.  +Ileostomy -NT, ND Musculoskeletal:     Comments:   + right  knee tenderness over quad tendon with PROM knee flexion No evidence of knee effusion bilaterally , no erythema around knees or ecchymosis, no jt line tenderness Neurological: He is alert and oriented to person, place, and time.  Motor: Right upper extremity: 4 -/5 proximal distal Left upper extremity: Shoulder abduction, elbow flexion/extension 2+/5, handgrip trace Bilateral lower extremities: Hip flexion, knee extension 2/5, ankle dorsiflexion 0/5 Sensation intact light touch Dysarthria Left facial weakness  MSK limited Right knee flexion to 90deg, left knee to ~110deg, Left hand fingers with ~45 deg flexion at DIPs , ~60 deg at PIP and MCP Psychiatric: Situational depression--mood brightens with talk of  his family.     Assessment/Plan: 1. Functional deficits secondary to acute brainstem infarct which require 3+ hours per day of interdisciplinary therapy in a comprehensive inpatient rehab setting.  Physiatrist is providing close team supervision and 24 hour management of active medical problems listed below.  Physiatrist and rehab team continue  to assess barriers to discharge/monitor patient progress toward functional and medical goals  Care Tool:  Bathing    Body parts bathed by patient: Left arm, Chest, Abdomen, Face   Body parts bathed by helper: Right arm, Front perineal area, Buttocks, Right upper leg, Left upper leg, Right lower leg, Left lower leg     Bathing assist Assist Level: Maximal Assistance - Patient 24 - 49%     Upper Body Dressing/Undressing Upper body dressing   What is the patient wearing?: Pull over shirt    Upper body assist Assist Level: Maximal Assistance - Patient 25 - 49%    Lower Body Dressing/Undressing Lower body dressing      What is the patient wearing?: Pants     Lower body assist Assist for lower body dressing: Maximal Assistance - Patient 25 - 49%     Toileting Toileting    Toileting assist Assist for toileting: Independent with assistive device Assistive Device Comment: urinal(has ileostomy as well/ total care with that)   Transfers Chair/bed transfer  Transfers assist  Chair/bed transfer activity did not occur: Safety/medical concerns  Chair/bed transfer assist level: Contact Guard/Touching assist     Locomotion Ambulation   Ambulation assist   Ambulation activity did not occur: Safety/medical concerns  Assist level: 2 helpers Assistive device: Walker-Eva Max distance: 51ft   Walk 10 feet activity   Assist  Walk 10 feet activity did not occur: Safety/medical concerns  Assist level: 2 helpers Assistive device: Walker-Eva   Walk 50 feet activity   Assist Walk 50 feet with 2 turns activity did not occur: Safety/medical concerns         Walk 150 feet activity   Assist Walk 150 feet activity did not occur: Safety/medical concerns         Walk 10 feet on uneven surface  activity   Assist Walk 10 feet on uneven surfaces activity did not occur: Safety/medical concerns         Wheelchair     Assist Will patient use wheelchair at  discharge?: Yes Type of Wheelchair: Manual    Wheelchair assist level: Supervision/Verbal cueing Max wheelchair distance: 50'    Wheelchair 50 feet with 2 turns activity    Assist        Assist Level: Supervision/Verbal cueing   Wheelchair 150 feet activity     Assist  Wheelchair 150 feet activity did not occur: Safety/medical concerns       Blood pressure (!) 130/93, pulse 88, temperature 98.4 F (36.9 C), temperature source Oral, resp. rate 15, height 5\' 10"  (1.778 m), weight 95.4 kg, SpO2 100 %.  Medical Problem List and Plan: 1.  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                          -ELOS/Goals: 14-17 days/Supervision/Min A            CIR Level PT , OT , SLP  2.  Antithrombotics: -DVT/anticoagulation:  Pharmaceutical: Heparin doppler neg for DVT              -antiplatelet therapy: ASA  3. Pain Management: tylenol for mild and oxycodone for severe pain.              Monitor with increased mobility Doesn't like how pain meds make him feel  4. Mood: LCSW to follow for support and evaluation. Team to provide ego support             -antipsychotic agents: N/A.  5. Neuropsych: This patient is capable of making decisions on his own behalf. 6. Skin/Wound Care: routine pressure relief measures.  7. Fluids/Electrolytes/Nutrition: Monitor I/O. Encourage intake--he's afraid to eat due to concerns of return of abdominal pain. Change TF to nights to promote hunger.              CMP 11/30  stable   8. Right pontine stroke: On ASA for secondary stroke prevention.  9. Mesenteric injuries s/p Ileocecectomy, total colectomy and repair of lacerations: Foley to collect high volume out of ileostomy."liquidy stools expected given lack of lg colon  Lomotil prn 10. Fistula/leak/abscess: On Zosyn 11/15-->11/29 per recommendations. May be able to d/c PICC if intake ok 11. Episode of SVT: Resolved with  BB             Monitor with increased mobility. 12. Situational depression with insomnia: Continue Seroquel at nights.   13. Right knee pain: Question MRI for workup.  14. ORIF left clavicle and left distal radius fracture: NWB left wrist--may WB thorough elbow with platform walker. 15.  IM nailing right femur and ORIF right bimalleolar Fx: .  16. Anemia:likely ABLA  11/30 CBC stable 17.  Bilateral knee contracture able to achieve near full extension , flexion is limited R>L   f/u xray showed no HO 18.  Left MCP, PIP and DIP contracture- extention, will ask OT to perform passive flexion of fingers   19.  Papular rash upper chest and thighs, non pruitic, does not look infectious, likely either med allergy or contact dermatitis, now off piperacillin , NKDA Monitor and treat sympromatically , ask pharmacy to review med list  Order benadryl cream    LOS: 7 days A FACE TO FACE EVALUATION WAS PERFORMED  Charlett Blake 09/08/2019, 9:17 AM

## 2019-09-08 NOTE — Progress Notes (Signed)
Order for PICC removal discussed with RN. Pt working with PT currently. He will be free from 10-11 and again at 1145-1345.

## 2019-09-08 NOTE — Progress Notes (Signed)
MEDICATION RELATED CONSULT NOTE - INITIAL   Pharmacy Consult for review of medications for potential of allergy Indication: Rash on chest and upper thighs  No Known Allergies  Patient Measurements: Height: 5\' 10"  (177.8 cm) Weight: 210 lb 5.1 oz (95.4 kg) IBW/kg (Calculated) : 73  Vital Signs: Temp: 98.4 F (36.9 C) (12/02 0351) Temp Source: Oral (12/02 0351) BP: 130/93 (12/02 0351) Pulse Rate: 88 (12/02 0351) Intake/Output from previous day: 12/01 0701 - 12/02 0700 In: 390 [P.O.:370; I.V.:20] Out: 1350 [Urine:800; Stool:550] Intake/Output from this shift: Total I/O In: 240 [P.O.:240] Out: 350 [Stool:350]  Labs: Recent Labs    09/06/19 0304  WBC 6.3  HGB 8.6*  HCT 27.6*  PLT 413*  CREATININE 1.01  ALBUMIN 2.4*  PROT 7.1  AST 21  ALT 36  ALKPHOS 147*  BILITOT 0.4   Estimated Creatinine Clearance: 91.3 mL/min (by C-G formula based on SCr of 1.01 mg/dL).   Microbiology: Recent Results (from the past 720 hour(s))  Culture, blood (routine x 2)     Status: None   Collection Time: 08/11/19  2:39 PM   Specimen: BLOOD  Result Value Ref Range Status   Specimen Description BLOOD LEFT UPPER ARM  Final   Special Requests   Final    BOTTLES DRAWN AEROBIC AND ANAEROBIC Blood Culture adequate volume   Culture   Final    NO GROWTH 5 DAYS Performed at [facility], [address], [place], [place] [address]    Report Status 08/16/2019 FINAL  Final  Culture, blood (routine x 2)     Status: None   Collection Time: 08/11/19  2:40 PM   Specimen: BLOOD RIGHT HAND  Result Value Ref Range Status   Specimen Description BLOOD RIGHT HAND  Final   Special Requests   Final    BOTTLES DRAWN AEROBIC ONLY Blood Culture results may not be optimal due to an inadequate volume of blood received in culture bottles   Culture   Final    NO GROWTH 5 DAYS Performed at [facility], [place] [address] [place], [place] [address]    Report Status 08/16/2019 FINAL  Final  MRSA PCR  Screening     Status: None   Collection Time: 08/30/19  9:05 AM   Specimen: Nasal Mucosa; Nasopharyngeal  Result Value Ref Range Status   MRSA by PCR NEGATIVE NEGATIVE Final    Comment:        The GeneXpert MRSA Assay (FDA approved for NASAL specimens only), is one component of a comprehensive MRSA colonization surveillance program. It is not intended to diagnose MRSA infection nor to guide or monitor treatment for MRSA infections. Performed at [facility], [place] [address] [place], [place] [address]     Medical History: Past Medical History:  Diagnosis Date  . History of kidney stones   . Hypertension   . Kidney stone   . Kidney stones   . Low testosterone     Medications:  Scheduled:  . aspirin  324 mg Oral Daily  . chlorhexidine  15 mL Mouth Rinse BID  . Chlorhexidine Gluconate Cloth  6 each Topical Daily  . cholecalciferol  2,000 Units Oral BID  . diphenoxylate-atropine  2 tablet Oral BID  . feeding supplement  1 Container Oral TID BM  . feeding supplement (PRO-STAT SUGAR FREE 64)  30 mL Oral BID  . heparin injection (subcutaneous)  5,000 Units Subcutaneous Q8H  . lip balm  1 application Topical BID  .  mouth rinse  15 mL Mouth Rinse q12n4p  . multivitamin with minerals  1 tablet Oral Daily  . nutrition supplement (JUVEN)  1 packet Oral BID BM  . propranolol  20 mg Oral TID  . QUEtiapine  25 mg Oral QHS  . sodium chloride flush  10-40 mL Intracatheter Q12H  . zolpidem  5 mg Oral QHS    Assessment: Patient with new onset rash on chest and upper thighs. Does not c/o itching or pain. Patient not on any new meds within the last 2-3 days and the beta lactam has been stopped for 3 days. Med list reviewed and no medications noted that are high risk for drug reactions. Perhaps reaction is in response to dietary change?  Plan:  No changes recommended.  Would monitor rash duration and severity  Kellie Chisolm A. Levada Dy, PharmD, BCPS, FNKF Clinical Pharmacist [facility] Please utilize Amion for appropriate phone number to reach the unit pharmacist ([place])    Theotis Burrow 09/08/2019,9:51 AM

## 2019-09-08 NOTE — Progress Notes (Signed)
Physical Therapy Session Note  Patient Details  Name: [name] MRN: [id number] Date of Birth: 1961/02/28  Today's Date: 09/08/2019 PT Individual Time: 0045-9977 PT Individual Time Calculation (min): 29 min   Short Term Goals: Week 1:  PT Short Term Goal 1 (Week 1): Pt will transfer to [facility] with min assist PT Short Term Goal 2 (Week 1): Pt will propell WC 156f with min assist using Hemi technique. PT Short Term Goal 3 (Week 1): Pt will initiate gait training. PT Short Term Goal 4 (Week 1): Pt will perform bed mobility with min assist  Skilled Therapeutic Interventions/Progress Updates:   Pt received supine in bed and agreeable to PT at bed level. PT instructed pt in supine therex: SLR, hip abduction, heel slides, SAQ, AAROM ankle pumps in the RLE. Cues from PT for full ROM, decreased speed and decreased compensations intermittently. pt left supine in bed with call bell in reach and all needs met.      Therapy Documentation Precautions:  Precautions Precautions: Fall Precaution Comments: colostomy; LUE splint; rt cam boot Required Braces or Orthoses: Splint/Cast, Other Brace Splint/Cast: lt wrist splint; cam boot rt ankle Restrictions Weight Bearing Restrictions: Yes LUE Weight Bearing: Weight bearing as tolerated(through the elbow, NWB through wrist) RLE Weight Bearing: Weight bearing as tolerated Other Position/Activity Restrictions: WBAT   Vital Signs: Therapy Vitals Temp: 98.1 F (36.7 C) Pulse Rate: 96 Resp: 20 BP: (!) 124/95 Patient Position (if appropriate): Sitting Oxygen Therapy SpO2: 100 % O2 Device: Room Air Pain: Pain Assessment Pain Scale: 0-10 Pain Score: 1  Pain Type: Acute pain Pain Location: Generalized Pain Descriptors / Indicators: Aching;Discomfort Pain Frequency: Constant Pain Onset: On-going Pain Intervention(s): Medication (See eMAR)    Therapy/Group: Individual Therapy  ALorie Phenix12/11/2018, 4:29 PM
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
## 2019-09-08 NOTE — Progress Notes (Signed)
Speech Language Pathology Daily Session Note  Patient Details  Name: KHAMBREL AMSDEN MRN: [id number] Date of Birth: 1961-02-16  Today's Date: 09/08/2019 SLP Individual Time: 0518-3358 SLP Individual Time Calculation (min): 45 min  Short Term Goals: Week 1: SLP Short Term Goal 1 (Week 1): Pt will consume dys 3 textures and thin liquid diet with mod I use of swallow strategies and no overt s/s aspiration. SLP Short Term Goal 1 - Progress (Week 1): Discontinued (comment)(Not appropriate at this time) SLP Short Term Goal 2 (Week 1): Pt will consume regular texture trials with appropriate mastication and oral clearnce in x3 sessions prior to soild upgrade with no overt s/s aspiration. SLP Short Term Goal 2 - Progress (Week 1): Discontinued (comment)(not appropriate at this time) SLP Short Term Goal 3 (Week 1): Pt will paricipate in assessment of higher level cognitive and executive function skills. SLP Short Term Goal 3 - Progress (Week 1): Met SLP Short Term Goal 4 (Week 1): Pt will complete 3 sets of 10 RMT exercises to increase respiratory support for speech and swallowing. SLP Short Term Goal 5 (Week 1): Pt will consume current diet with minimal overt s/s of aspiration or dysphagia. SLP Short Term Goal 6 (Week 1): Pt will increase vocal intensity at the sentence level during 75% of production with Min A cues for use of respiratory support strategies.  Skilled Therapeutic Interventions: Skilled ST services focused on swallow and speech skills. Pt repositioned himself in bed mod I for safe PO intake. SLP facilitated PO consumption of dys 2 and thin via cup lunch tray, pt demonstrated appropriate mastication and oral clearances with x2 initial cough and cough with cold ice cream only. Pt required min A verbal cues to increase vocal intensity during conversation while SLP was ambulating around room. Pt demonstrated performance of EMST device set at 45 cm H2O completing x10 repetition with self-precived  effort level of 7 out 10. Pt agreed to complete at least 2 more sets of 10 today. Pt expressed concerns of deconditioning, SLP provided education on strategies to assist breat support with EMST as well as remain sited during the day and out of bed, pt agreed. Pt was left in room with call bell within reach and bed alarm set. ST recommends to continue skilled ST services.      Pain Pain Assessment Pain Scale: 0-10 Pain Score: 0-No pain Pain Type: Acute pain Pain Location: Generalized Pain Orientation: Right;Left Pain Descriptors / Indicators: Aching;Discomfort Pain Frequency: Constant Pain Onset: On-going Pain Intervention(s): Medication (See eMAR)  Therapy/Group: Individual Therapy  Vannary Greening  [facility] 09/08/2019, 11:53 AM
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
## 2019-09-08 NOTE — Progress Notes (Signed)
Yesterday morning, abdominal dressing was changed at patient's request. The wound looks like 2 wounds. There is an upper incision that is 4 X 1.5 X 0.4cm & has granulation tissue noted. The lower open incision measured 5 x 1.5 x 0.4cm & has beefy red granulation tissue. It was tender to the touch at the time of procedure. Patient tolerated it well. The drainage from both is minimal serosanguinous & there was no signs of infection. No surrounding redness, odor or purulent drainage. Areas were cleansed with normal saline & packed with saline moistened gauze, covered with an abd pad & secured with tape. No acute distress noted. Report was given to the oncoming nurse this morning.

## 2019-09-08 NOTE — Plan of Care (Signed)
  Problem: RH Problem Solving Goal: LTG Patient will demonstrate problem solving for (SLP) Description: LTG:  Patient will demonstrate problem solving for basic/complex daily situations with cues  (SLP) Outcome: Completed/Met Flowsheets (Taken 09/03/2019 1415) LTG: Patient will demonstrate problem solving for (SLP): Complex daily situations LTG Patient will demonstrate problem solving for: Modified Independent

## 2019-09-08 NOTE — Progress Notes (Signed)
Occupational Therapy Session Note  Patient Details  Name: [name] MRN: [id number] Date of Birth: 16-Mar-1961  Today's Date: 09/08/2019 OT Individual Time: 1345-1455 OT Individual Time Calculation (min): 70 min    Short Term Goals: Week 1:  OT Short Term Goal 1 (Week 1): Pt will perform UB dressing with min A overall. OT Short Term Goal 2 (Week 1): Pt will perform LB dressing with mod A overall. OT Short Term Goal 3 (Week 1): Pt will perform toilet transfer with mod A overall.  Skilled Therapeutic Interventions/Progress Updates:    Upon entering the room, pt supine in bed with no c/o pain and agreeable to OT intervention. Pt performed supine >sit with min guard. OT placed wheelchair and pt verbalized step by step how to set up transfer. Pt able to perform squat pivot transfers L <> R with min guard overall. Pt propelled wheelchair 76' with R UE and L LE with increased time to complete tasks. OT assisted him the rest of the way to day room for time management. Pt transferred onto NuStep and engaged in task with B LEs for 6  Minutes x 2 reps with focus on ROM and stretching. Pt completing 12 "step" each minute. Pt returning to wheelchair in same manner and assisted to ADL apartment. OT discussed home set up and transfers for discharge. Focus on uneven squat pivot transfer with pt able to perform min A down into chair and mod A for "uphill" squat pivot to the L. Pt returning to the room at end of session with call bell and all needed items within reach.   Therapy Documentation Precautions:  Precautions Precautions: Fall Precaution Comments: colostomy; LUE splint; rt cam boot Required Braces or Orthoses: Splint/Cast, Other Brace Splint/Cast: lt wrist splint; cam boot rt ankle Restrictions Weight Bearing Restrictions: Yes LUE Weight Bearing: Weight bearing as tolerated(through the elbow, NWB through wrist) RLE Weight Bearing: Weight bearing as tolerated Other Position/Activity  Restrictions: WBAT General:   Vital Signs: Therapy Vitals Temp: 98.1 F (36.7 C) Pulse Rate: 96 Resp: 20 BP: (!) 124/95 Patient Position (if appropriate): Sitting Oxygen Therapy SpO2: 100 % O2 Device: Room Air Pain: Pain Assessment Pain Scale: 0-10 Pain Score: 1  Pain Type: Acute pain Pain Location: Generalized Pain Descriptors / Indicators: Aching;Discomfort Pain Frequency: Constant Pain Onset: On-going Pain Intervention(s): Medication (See eMAR)   Therapy/Group: Individual Therapy  Gypsy Decant 09/08/2019, 4:23 PM

## 2019-09-09 ENCOUNTER — Inpatient Hospital Stay (HOSPITAL_COMMUNITY): Payer: 59 | Admitting: Physical Therapy

## 2019-09-09 ENCOUNTER — Inpatient Hospital Stay (HOSPITAL_COMMUNITY): Payer: 59 | Admitting: Speech Pathology

## 2019-09-09 ENCOUNTER — Inpatient Hospital Stay (HOSPITAL_COMMUNITY): Payer: 59
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
## 2019-09-09 NOTE — Progress Notes (Addendum)
Speech Language Pathology Daily Session Note  Patient Details  Name: [name] MRN: [id number] Date of Birth: 07/22/1961  Today's Date: 09/09/2019   SLP Individual Time: 0802-0820 SLP Individual Time Calculation (min): 18 min   SLP Individual Time: 2353-6144 SLP Individual Time Calculation (min): 35 min   SLP Individual Time: 3154-0086 SLP Individual Time Calculation (min): 25 min   SLP Individual Time: 7619-5093 SLP Individual Time Calculation (min): 23 min    Short Term Goals: Week 1: SLP Short Term Goal 1 (Week 1): Pt will consume dys 3 textures and thin liquid diet with mod I use of swallow strategies and no overt s/s aspiration. SLP Short Term Goal 1 - Progress (Week 1): Discontinued (comment)(Not appropriate at this time) SLP Short Term Goal 2 (Week 1): Pt will consume regular texture trials with appropriate mastication and oral clearnce in x3 sessions prior to soild upgrade with no overt s/s aspiration. SLP Short Term Goal 2 - Progress (Week 1): Discontinued (comment)(not appropriate at this time) SLP Short Term Goal 3 (Week 1): Pt will paricipate in assessment of higher level cognitive and executive function skills. SLP Short Term Goal 3 - Progress (Week 1): Met SLP Short Term Goal 4 (Week 1): Pt will complete 3 sets of 10 RMT exercises to increase respiratory support for speech and swallowing. SLP Short Term Goal 5 (Week 1): Pt will consume current diet with minimal overt s/s of aspiration or dysphagia. SLP Short Term Goal 6 (Week 1): Pt will increase vocal intensity at the sentence level during 75% of production with Min A cues for use of respiratory support strategies.  Skilled Therapeutic Interventions:  Skilled treatment session #1 Per chart review, pt continues to demonstrate overt s/s of aspiration when consuming thin liquids at bedside. SLP visited with pt to create plan to on how to target overt s/s of aspiration. Upon entering pt's room, his vocal quality was  much improved over initial evaluation on 09/04/19. Pt actually achieved voicing throughout this session. SLP consulted with pt to create plan for MBS during pt's ST session this day. Education provided on MBS, pt able to recall previous MBS and he is agreeable to repeat MBS but feels that his swallow is "better." Education provided to pt on location of his CVA, higher aspiration risk on most recent MBS, recurrent overt s/s at bedside and Cortrak has been removed so fully range of swallow ability can be assessed. Pt agreeable.   This Probation officer followed up with pt's wife who also agree that pt's voice is much stronger and "actually sounds like a voice." Plan for MBS shared with wife.   Skilled treatment session #2 MBS completed. Pt presents with functional oral phase when consuming dysphagia 3, mixed consistencies, puree and thin liquids via cup and straw. Pt demonstrates mildly impaired pharyngeal phase dysphagia that involves sensorimotor impairments. Pt's swallow is delayed swallow initiation to pyriform sinuses resulting in  penetration during swallow when consuming thin liquids. All penetrates were expelled during swallow and pt demonstrated good ability to protect his airway. However, when consuming more advanced solids such as graham crackers and diced peaches, pt has pharyngeal residue in the vallecula and pyriform sinuses. Pt senses this residue and attempts to eject the residue with throat clears. Pt cued for re-swallow but pt unable to produce. Thin liquid wash was effective in clearing residue. Pharyngeal residue maybe related to base of tongue weakness as it appeared to lack strength in propelling bolus through upper pharynx. Pt with good insight  in fact that thin barium "went down better" then thin liquids consumed at bedside.   HOWEVER, as study was ending, pt given some thin regular water with immediate coughing when consuming. Unfortunately regular water is not able to be captured under fluro.  Thin barium was thinned with regular water to thinner consistencies with no aspiration or coughing present. Barium was thinned to point that is was barely visible on fluro. Pt is able to protect airway with all trials. At end of barium trials, pt given regular water with coughing continued when consuming. At this time, recommend dysphagia 2 diet d/t pharyngeal residue and need to practice liquid wash at bedside prior to upgrade, thin liquids and medicine whole in puree. SLP to create further plan to decrease s/s at bedside including postural compensatory strategies such as chin tuck, head turn/tilt, use of liquid wash to clear dysphagia 3 residue, recommend targeting base of tongue strengthening and trials of carbonated drinks at bedside.   Skilled treatment session #3 Skilled treatment session focused on dysphagia. SLP received pt upright in bed. Pt had placed bed in chair position (as strongly reiterated during MBS this morning). Pt was self-feeding dysphagia 2 lunch tray with thin liquids via cup (water). Pt with throat clear while consuming water (consistent with trials during MBS). SLP reviewed pt's MBS with him including the following concepts: 1:) pt does have pharyngeal residue with dysphagia 3, 2:) pt is protecting his airway with thin barium (both cup and straw), 3:) coughing/throat clears when consuming regular water weren't captured on fluro and 4:) list of possible strategies introduced to help with s/s at bedside. List of strategies to decrease s/s at bedside include 1:)  trial consumption of carbonated liquids, 2:) liquid wash following bites of dysphagia 3, 3:) various postural positioning such as chin tuck/head turn/tilt and 4:) pharyngeal strengthening exercises targeting back of tongue. SLP provided skilled observation of pt consuming carbonated Sprite while consuming lunch tray. Pt was free of s/s during entire meal when consuming carbonated drinks. While pt's oral phase may appear mildly  prolonged, it is functional as observed on MBS and is free of any s/s of anxiety. At this time, recommend pt continue consuming carbonated liquids with more trials of dysphagia 3 at bedside with further accommodations to ease s/s of dysphagia at bedside.   Skilled treatment session #4 [name] in facility with questions regarding results of MBS and ST POC. SLP provided education on results of MBS specifically pt's ability to protect his airway when consuming thin barium. Explain rationale for trialing carbonated liquids as well as specific strategies (liquid wash, base of tongue strengthening) to advance pt's diet as compensatory strategies such as chin tuck/head turn/tilt with thin water. [name] very pleased with strength of voice. All questions answered to their satisfaction. Pt to be NPO after midnight for ortho OR visit on 09/10/19. ST will begin further trials of dysphagia 3 after recovery.        Pain    Therapy/Group: Individual Therapy  Zoelle Markus 09/09/2019, 9:20 AM

## 2019-09-09 NOTE — Progress Notes (Signed)
Education was started with patient and wife. Wife assisted nurse with  changing the illeostomy and skin care around the stoma. Wife did very good with the change and patient was encouraged to help as well.No further questions at time.  Adria Devon, LPN

## 2019-09-09 NOTE — Progress Notes (Signed)
Occupational Therapy Session Note  Patient Details  Name: [name] MRN: [id number] Date of Birth: October 01, 1961  Today's Date: 09/09/2019 OT Individual Time: 1500-1600 OT Individual Time Calculation (min): 60 min    Short Term Goals: Week 1:  OT Short Term Goal 1 (Week 1): Pt will perform UB dressing with min A overall. OT Short Term Goal 2 (Week 1): Pt will perform LB dressing with mod A overall. OT Short Term Goal 3 (Week 1): Pt will perform toilet transfer with mod A overall. Week 2:     Skilled Therapeutic Interventions/Progress Updates:    1:1. Pt received in bed after fam ed with RN on colostomy changes with wife. Orthopedic PA approved WB and AROM through LUE. Pt agreeable to bathing and dressing at sink level. Pt completes CGA squat pivot to L with VC for head hips relationship. Upon sitting at sink pt tearful looking at himself for the first time. Support and encouragement provided. Pt completes bathing at sink with A for B feet, back and buttocks/peri-in standing.  Pt threads BLE into pants with reacher and S overall. Pt able to manage pants past hips in standing with MIN A. Socks donned dependently. Pt completes UB bathing with A for grip on washcloth on LUE. Pt would benefit from washmit for NMR/WB. Pt dons shirt with MIN A for pulling shirt past R wrist as it was caught on bracelets. Pt reporting stomach pain and RN noified. Repositioned in bed for comfort. Exited session with pt seated in bed, exit alarm on and call light in reach Therapy Documentation Precautions:  Precautions Precautions: Fall Precaution Comments: colostomy; LUE splint; rt cam boot Required Braces or Orthoses: Splint/Cast, Other Brace Splint/Cast: lt wrist splint; cam boot rt ankle Restrictions Weight Bearing Restrictions: Yes LUE Weight Bearing: Weight bear through elbow only RLE Weight Bearing: Weight bearing as tolerated Other Position/Activity Restrictions: WBAT General:   Vital Signs: Therapy  Vitals Temp: 98.9 F (37.2 C) Pulse Rate: 86 Resp: 18 BP: (!) 121/93 Patient Position (if appropriate): Lying Oxygen Therapy SpO2: 100 % O2 Device: Room Air Pain:   ADL:   Vision   Perception    Praxis   Exercises:   Other Treatments:     Therapy/Group: Individual Therapy  Tonny Branch 09/09/2019, 4:02 PM

## 2019-09-09 NOTE — Progress Notes (Signed)
Ortho Trauma Note:  I believe he would benefit from manipulation of right and left knee in OR to improve motion. We will plan for CPM postop. No braces needed for left upper or right lower extremity. May discontinue platform walker from orthopaedic perspective. NPO past midnight. Return to rehab post procedure.  Shona Needles, MD [facility] [phone] (office) [email or web address]

## 2019-09-09 NOTE — Plan of Care (Signed)
  Problem: Consults Goal: RH STROKE PATIENT EDUCATION Description: See Patient Education module for education specifics  Outcome: Progressing   Problem: RH BOWEL ELIMINATION Goal: RH STG MANAGE BOWEL WITH ASSISTANCE Description: STG Manage Bowel with Mod Assistance. Outcome: Progressing Goal: RH STG MANAGE BOWEL W/MEDICATION W/ASSISTANCE Description: STG Manage Bowel with Medication with Mod Assistance. Outcome: Progressing   Problem: RH BLADDER ELIMINATION Goal: RH STG MANAGE BLADDER WITH ASSISTANCE Description: STG Manage Bladder With Min Assistance Outcome: Progressing   Problem: RH SKIN INTEGRITY Goal: RH STG SKIN FREE OF INFECTION/BREAKDOWN Description: No new breakdown with min assist  Outcome: Progressing Goal: RH STG ABLE TO PERFORM INCISION/WOUND CARE W/ASSISTANCE Description: STG Able To Perform Incision/Wound Care With Mod Assistance. Outcome: Progressing   Problem: RH COGNITION-NURSING Goal: RH STG ANTICIPATES NEEDS/CALLS FOR ASSIST W/ASSIST/CUES Description: STG Anticipates Needs/Calls for Assist With Min Assistance/Cues. Outcome: Progressing   Problem: RH PAIN MANAGEMENT Goal: RH STG PAIN MANAGED AT OR BELOW PT'S PAIN GOAL Description: < 4 out of 10.  Outcome: Progressing   Problem: RH KNOWLEDGE DEFICIT Goal: RH STG INCREASE KNOWLEDGE OF DYSPHAGIA/FLUID INTAKE Description: Pt will demonstrate proper swallowing techniques and will be able to verbalize strategies to manage dysphagia while in rehab with min assist.  Outcome: Progressing

## 2019-09-09 NOTE — Progress Notes (Signed)
Physical Therapy Weekly Progress Note  Patient Details  Name: [name] MRN: [id number] Date of Birth: 24-Jun-1961  Beginning of progress report period: September 03, 2019 End of progress report period: September 09, 2019  Today's Date: 09/09/2019 PT Individual Time: 0907-1030 AND 1302-1330 PT Individual Time Calculation (min): 83 min and 28 min   Patient has met 4 of 4 short term goals.  Pt is making steady progress towards LTG. Fear of falling, and limited knee ROM from possible scar tissue in the joint capsule have limited increased progress at this time, but pt has progressed to supervision assist bed mobility, min assist lateral scoot transfers and WC mobility for short distances, as well as mod assist for stand pivot transfers and gait for short distances with PFRW, LAFO, and R cam boot.   Patient continues to demonstrate the following deficits muscle weakness, muscle joint tightness and muscle paralysis, decreased cardiorespiratoy endurance, abnormal tone, unbalanced muscle activation and decreased coordination, decreased initiation, decreased awareness, decreased problem solving and delayed processing and decreased sitting balance, decreased standing balance, decreased postural control, hemiplegia and decreased balance strategies and therefore will continue to benefit from skilled PT intervention to increase functional independence with mobility.  Patient progressing toward long term goals..  Continue plan of care.  PT Short Term Goals Week 1:  PT Short Term Goal 1 (Week 1): Pt will transfer to [facility] with min assist PT Short Term Goal 1 - Progress (Week 1): Met PT Short Term Goal 2 (Week 1): Pt will propell WC 16f with min assist using Hemi technique. PT Short Term Goal 2 - Progress (Week 1): Met PT Short Term Goal 3 (Week 1): Pt will initiate gait training. PT Short Term Goal 3 - Progress (Week 1): Met PT Short Term Goal 4 (Week 1): Pt will perform bed mobility with min assist PT  Short Term Goal 4 - Progress (Week 1): Met Week 2:  PT Short Term Goal 1 (Week 2): Pt will ambulate 352fwith mod assist and LRAD PT Short Term Goal 2 (Week 2): Pt will performed sit<>stand with min assist PT Short Term Goal 3 (Week 2): Pt will propell WC >15053fith supervision assist  Skilled Therapeutic Interventions/Progress Updates:  Session.  Pt received supine in bed and agreeable to PT. Supine>sit transfer with supervision assist from PT. Sitting balance EOB with supervision assist while PT assisted pt to don R CAM walker and LAFO. Sit<>stand from elevated bed with mod assist and PFRW. Stand [facility] due to FOF and mild quad weakness and coordination deficits on the LLE.  Ortho MD then present to educate pt on changes in WB status through L wrist and R LE. Sit<>stand x 2 from WC [facility] PT to assist pt to don pants standing; mod assist fotr each transfer and moderate cues for posture and anterior weight shift.   Pt transferred to day room. AAROM and PRM for L fingers and wrist into extension 4 bouts x 1 min each and grade 2 joint mobs into extension at the wrist, 2 x 30 sec within pain free range.   Sit<>[facility] and moderate cues for proper UE placement, anterior weight shift, and posture to allow improved success. Standing tolerance wth L wrist supported in hand orthotic on RW 3 x 1 min; pt reports mild orthostatic hypotension s/s in standing on each bout. Pt also performed pregait stepping with with RW x 2 BLE with noted ataxia in  the LLE into adduction.   Pt returned to room in [facility]. NT assisted to empty ostomy bag. Pt performed lateral scoot transfer with min assist to bed and cues for proper UE placement. Sit>supine completed with min assist, and left supine in bed with call bell in reach and all needs met.   Session 2.    Pt received supine in bed and agreeable to PT. At bed level. PT instructed pt in L wrist hand flexion/extension with  tenodesis grip 4 x 1 min with short rest break between bouts. PT spoke with PA regarding need for resting hand splint. PT instructed pt ankle DF 2 x 10 with AAROM for BLE. Trace activation in BLE noted on this day. Knee flexion.extension with over pressure into flexion to improve ROM. PT assisted pt to brush teeth with set up assist. At end of session, min assist to doff pants while supine to attempt urination. Pt left supine in bed with call bell in reach and all needs met.         Therapy Documentation Precautions:  Precautions Precautions: Fall Precaution Comments: colostomy; LUE splint; rt cam boot Required Braces or Orthoses: Splint/Cast, Other Brace Splint/Cast: lt wrist splint; cam boot rt ankle Restrictions Weight Bearing Restrictions: Yes LUE Weight Bearing: Weight bear through elbow only(NWB through wrist) RLE Weight Bearing: Weight bearing as tolerated Other Position/Activity Restrictions: WBAT Pain: Denies at rest   Therapy/Group: Individual Therapy  [name] 09/09/2019, 11:08 AM

## 2019-09-09 NOTE — Progress Notes (Signed)
Modified Barium Swallow Progress Note  Patient Details  Name: SHAHEED KOERBER MRN: [id number] Date of Birth: 11-28-1960  Today's Date: 09/09/2019  Modified Barium Swallow completed.  Full report located under Chart Review in the Imaging Section.  Brief recommendations include the following:  Clinical Impression     MBS completed. Pt presents with functional oral phase when consuming dysphagia 3, mixed consistencies, puree and thin liquids via cup and straw. Pt demonstrates mildly impaired pharyngeal phase dysphagia that involves sensorimotor impairments. Pt's swallow is delayed swallow initiation to pyriform sinuses resulting in  penetration during swallow when consuming thin liquids. All penetrates were expelled during swallow and pt demonstrated good ability to protect his airway. However, when consuming more advanced solids such as graham crackers and diced peaches, pt has pharyngeal residue in the vallecula and pyriform sinuses. Pt senses this residue and attempts to eject the residue with throat clears. Pt cued for re-swallow but pt unable to produce. Thin liquid wash was effective in clearing residue. Pharyngeal residue maybe related to base of tongue weakness as it appeared to lack strength in propelling bolus through upper pharynx. Pt with good insight in fact that thin barium "went down better" then thin liquids consumed at bedside.   HOWEVER, as study was ending, pt given some thin regular water with immediate coughing when consuming. Unfortunately regular water is not able to be captured under fluro. Thin barium was thinned with regular water to thinner consistencies with no aspiration or coughing present. Barium was thinned to point that is was barely visible on fluro. Pt is able to protect airway with all trials. At end of barium trials, pt given regular water with coughing continued when consuming. At this time, recommend dysphagia 2 diet d/t pharyngeal residue and need to practice liquid  wash at bedside prior to upgrade, thin liquids and medicine whole in puree. SLP to create further plan to decrease s/s at bedside including postural compensatory strategies such as chin tuck, head turn/tilt, use of liquid wash to clear dysphagia 3 residue, recommend targeting base of tongue strengthening and trials of carbonated drinks at bedside.    Swallow Evaluation Recommendations       SLP Diet Recommendations: Dysphagia 2 (Fine chop) solids;Thin liquid   Liquid Administration via: Cup;Straw   Medication Administration: Whole meds with puree   Supervision: Intermittent supervision to cue for compensatory strategies   Compensations: Slow rate;Small sips/bites;Follow solids with liquid   Postural Changes: Seated upright at 90 degrees   Oral Care Recommendations: Oral care BID        Arena Lindahl 09/09/2019,5:07 PM

## 2019-09-10 ENCOUNTER — Encounter (HOSPITAL_COMMUNITY)
Admission: RE | Disposition: A | Discharge status: Home / Self Care | Payer: Self-pay | Source: Home / Self Care | Attending: Physical Medicine & Rehabilitation

## 2019-09-10 ENCOUNTER — Inpatient Hospital Stay (HOSPITAL_COMMUNITY): Payer: 59 | Admitting: Speech Pathology

## 2019-09-10 ENCOUNTER — Inpatient Hospital Stay (HOSPITAL_COMMUNITY): Payer: 59 | Admitting: Anesthesiology

## 2019-09-10 ENCOUNTER — Inpatient Hospital Stay (HOSPITAL_REHABILITATION)
Admission: RE | Admit: 2019-09-10 | Discharge: 2019-09-28 | Disposition: A | Discharge status: Home / Self Care | Payer: 59 | Source: Home / Self Care | Attending: Physical Medicine & Rehabilitation | Admitting: Physical Medicine & Rehabilitation

## 2019-09-10 ENCOUNTER — Inpatient Hospital Stay (HOSPITAL_COMMUNITY): Payer: 59 | Admitting: Physical Therapy

## 2019-09-10 ENCOUNTER — Inpatient Hospital Stay (HOSPITAL_COMMUNITY): Payer: 59 | Admitting: Occupational Therapy

## 2019-09-10 DIAGNOSIS — I10 Essential (primary) hypertension: Secondary | ICD-10-CM | POA: Diagnosis present

## 2019-09-10 DIAGNOSIS — S52502A Unspecified fracture of the lower end of left radius, initial encounter for closed fracture: Secondary | ICD-10-CM | POA: Diagnosis present

## 2019-09-10 DIAGNOSIS — F418 Other specified anxiety disorders: Secondary | ICD-10-CM | POA: Diagnosis present

## 2019-09-10 DIAGNOSIS — N179 Acute kidney failure, unspecified: Secondary | ICD-10-CM | POA: Diagnosis present

## 2019-09-10 DIAGNOSIS — S62102A Fracture of unspecified carpal bone, left wrist, initial encounter for closed fracture: Secondary | ICD-10-CM | POA: Diagnosis present

## 2019-09-10 DIAGNOSIS — S3590XA Unspecified injury of unspecified blood vessel at abdomen, lower back and pelvis level, initial encounter: Secondary | ICD-10-CM | POA: Diagnosis present

## 2019-09-10 DIAGNOSIS — D75839 Thrombocytosis, unspecified: Secondary | ICD-10-CM

## 2019-09-10 DIAGNOSIS — G479 Sleep disorder, unspecified: Secondary | ICD-10-CM

## 2019-09-10 DIAGNOSIS — I6389 Other cerebral infarction: Secondary | ICD-10-CM

## 2019-09-10 DIAGNOSIS — I6330 Cerebral infarction due to thrombosis of unspecified cerebral artery: Secondary | ICD-10-CM | POA: Diagnosis present

## 2019-09-10 DIAGNOSIS — E871 Hypo-osmolality and hyponatremia: Secondary | ICD-10-CM

## 2019-09-10 DIAGNOSIS — M24562 Contracture, left knee: Secondary | ICD-10-CM

## 2019-09-10 DIAGNOSIS — M24561 Contracture, right knee: Secondary | ICD-10-CM

## 2019-09-10 DIAGNOSIS — D62 Acute posthemorrhagic anemia: Secondary | ICD-10-CM | POA: Diagnosis present

## 2019-09-10 HISTORY — PX: KNEE CLOSED REDUCTION: SHX995

## 2019-09-10 SURGERY — MANIPULATION, KNEE, CLOSED
Anesthesia: General | Site: Knee | Laterality: Right

## 2019-09-10 MED ORDER — PROPOFOL 10 MG/ML IV BOLUS
INTRAVENOUS | Status: AC
  Filled 2019-09-10: qty 40

## 2019-09-10 MED ORDER — LACTATED RINGERS IV SOLN
INTRAVENOUS | Status: DC | PRN
  Administered 2019-09-10: 07:00:00 via INTRAVENOUS

## 2019-09-10 MED ORDER — OXYCODONE HCL 5 MG/5ML PO SOLN: 5.0000 mg | Freq: Once | ORAL | Status: DC | PRN

## 2019-09-10 MED ORDER — PROPOFOL 10 MG/ML IV BOLUS
INTRAVENOUS | Status: DC | PRN
  Administered 2019-09-10: 200 mg via INTRAVENOUS

## 2019-09-10 MED ORDER — LIDOCAINE HCL URETHRAL/MUCOSAL 2 % EX GEL: 1.0000 "application " | CUTANEOUS | Status: DC | PRN

## 2019-09-10 MED ORDER — FENTANYL CITRATE (PF) 250 MCG/5ML IJ SOLN
INTRAMUSCULAR | Status: AC
  Filled 2019-09-10: qty 5

## 2019-09-10 MED ORDER — TRAMADOL HCL 50 MG PO TABS
25.0000 mg | ORAL_TABLET | Freq: Four times a day (QID) | ORAL | Status: DC | PRN
  Administered 2019-09-10: 25 mg via ORAL
  Filled 2019-09-10: qty 1

## 2019-09-10 MED ORDER — HEPARIN SODIUM (PORCINE) 5000 UNIT/ML IJ SOLN: 5000.0000 [IU] | Freq: Three times a day (TID) | INTRAMUSCULAR | Status: DC

## 2019-09-10 MED ORDER — DIPHENHYDRAMINE-ZINC ACETATE 2-0.1 % EX CREA
TOPICAL_CREAM | Freq: Two times a day (BID) | CUTANEOUS | Status: DC | PRN
  Administered 2019-09-13: 18:00:00 via TOPICAL

## 2019-09-10 MED ORDER — PROPRANOLOL HCL 20 MG PO TABS
20.0000 mg | ORAL_TABLET | Freq: Three times a day (TID) | ORAL | Status: DC
  Administered 2019-09-10 – 2019-09-28 (×53): 20 mg via ORAL
  Filled 2019-09-10 (×28): qty 1
  Filled 2019-09-10: qty 2
  Filled 2019-09-10 (×4): qty 1
  Filled 2019-09-10: qty 2
  Filled 2019-09-10 (×20): qty 1

## 2019-09-10 MED ORDER — MEPERIDINE HCL 25 MG/ML IJ SOLN: 6.2500 mg | INTRAMUSCULAR | Status: DC | PRN

## 2019-09-10 MED ORDER — ADULT MULTIVITAMIN W/MINERALS CH
1.0000 | ORAL_TABLET | Freq: Every day | ORAL | Status: DC
  Administered 2019-09-11 – 2019-09-28 (×18): 1 via ORAL
  Filled 2019-09-10 (×19): qty 1

## 2019-09-10 MED ORDER — QUETIAPINE FUMARATE 25 MG PO TABS
25.0000 mg | ORAL_TABLET | Freq: Every day | ORAL | Status: DC
  Administered 2019-09-10 – 2019-09-27 (×18): 25 mg via ORAL
  Filled 2019-09-10 (×18): qty 1

## 2019-09-10 MED ORDER — PROCHLORPERAZINE MALEATE 5 MG PO TABS: 5.0000 mg | ORAL_TABLET | Freq: Four times a day (QID) | ORAL | Status: DC | PRN

## 2019-09-10 MED ORDER — ZOLPIDEM TARTRATE 5 MG PO TABS
5.0000 mg | ORAL_TABLET | Freq: Every day | ORAL | Status: DC
  Administered 2019-09-10 – 2019-09-25 (×16): 5 mg via ORAL
  Filled 2019-09-10 (×16): qty 1

## 2019-09-10 MED ORDER — PROCHLORPERAZINE EDISYLATE 10 MG/2ML IJ SOLN: 5.0000 mg | Freq: Four times a day (QID) | INTRAMUSCULAR | Status: DC | PRN

## 2019-09-10 MED ORDER — JUVEN PO PACK
1.0000 | PACK | Freq: Two times a day (BID) | ORAL | Status: DC
  Administered 2019-09-13 – 2019-09-18 (×5): 1 via ORAL
  Filled 2019-09-10 (×13): qty 1

## 2019-09-10 MED ORDER — NON FORMULARY: 6.0000 mg | Freq: Every day | Status: DC

## 2019-09-10 MED ORDER — PROMETHAZINE HCL 25 MG/ML IJ SOLN: 6.2500 mg | INTRAMUSCULAR | Status: DC | PRN

## 2019-09-10 MED ORDER — ONDANSETRON HCL 4 MG/2ML IJ SOLN: 4.0000 mg | Freq: Four times a day (QID) | INTRAMUSCULAR | Status: DC | PRN

## 2019-09-10 MED ORDER — OXYCODONE HCL 5 MG PO TABS: 5.0000 mg | ORAL_TABLET | Freq: Once | ORAL | Status: DC | PRN

## 2019-09-10 MED ORDER — LORAZEPAM 2 MG/ML IJ SOLN: 0.2500 mg | INTRAMUSCULAR | Status: DC | PRN

## 2019-09-10 MED ORDER — ASPIRIN 81 MG PO CHEW
324.0000 mg | CHEWABLE_TABLET | Freq: Every day | ORAL | Status: DC
  Administered 2019-09-11 – 2019-09-18 (×8): 324 mg via ORAL
  Filled 2019-09-10 (×8): qty 4

## 2019-09-10 MED ORDER — ENSURE ENLIVE PO LIQD
237.0000 mL | Freq: Two times a day (BID) | ORAL | Status: DC
  Administered 2019-09-11 – 2019-09-27 (×25): 237 mL via ORAL

## 2019-09-10 MED ORDER — ENOXAPARIN SODIUM 40 MG/0.4ML ~~LOC~~ SOLN
40.0000 mg | Freq: Every day | SUBCUTANEOUS | Status: DC
  Administered 2019-09-11 – 2019-09-28 (×18): 40 mg via SUBCUTANEOUS
  Filled 2019-09-10 (×19): qty 0.4

## 2019-09-10 MED ORDER — LIDOCAINE HCL (CARDIAC) PF 100 MG/5ML IV SOSY
PREFILLED_SYRINGE | INTRAVENOUS | Status: DC | PRN
  Administered 2019-09-10: 60 mg via INTRAVENOUS

## 2019-09-10 MED ORDER — OXYCODONE HCL 5 MG PO TABS: 5.0000 mg | ORAL_TABLET | ORAL | Status: DC | PRN

## 2019-09-10 MED ORDER — MIDAZOLAM HCL 2 MG/2ML IJ SOLN
INTRAMUSCULAR | Status: AC
  Filled 2019-09-10: qty 2

## 2019-09-10 MED ORDER — TRAMADOL HCL 50 MG PO TABS
50.0000 mg | ORAL_TABLET | Freq: Four times a day (QID) | ORAL | Status: DC | PRN
  Administered 2019-09-10 – 2019-09-12 (×5): 100 mg via ORAL
  Administered 2019-09-13: 50 mg via ORAL
  Administered 2019-09-14: 100 mg via ORAL
  Administered 2019-09-14: 50 mg via ORAL
  Administered 2019-09-15 – 2019-09-26 (×18): 100 mg via ORAL
  Filled 2019-09-10 (×17): qty 2
  Filled 2019-09-10: qty 1
  Filled 2019-09-10 (×5): qty 2
  Filled 2019-09-10: qty 1
  Filled 2019-09-10 (×4): qty 2

## 2019-09-10 MED ORDER — HYDROMORPHONE HCL 1 MG/ML IJ SOLN: 0.2500 mg | INTRAMUSCULAR | Status: DC | PRN

## 2019-09-10 MED ORDER — DIPHENOXYLATE-ATROPINE 2.5-0.025 MG PO TABS
2.0000 | ORAL_TABLET | Freq: Two times a day (BID) | ORAL | Status: DC
  Administered 2019-09-10 – 2019-09-28 (×36): 2 via ORAL
  Filled 2019-09-10 (×37): qty 2

## 2019-09-10 MED ORDER — LORAZEPAM 0.5 MG PO TABS
0.2500 mg | ORAL_TABLET | ORAL | Status: DC | PRN
  Administered 2019-09-17: 10:00:00 0.25 mg via ORAL
  Filled 2019-09-10: qty 1

## 2019-09-10 MED ORDER — LIP MEDEX EX OINT: TOPICAL_OINTMENT | CUTANEOUS | Status: DC | PRN

## 2019-09-10 MED ORDER — ALUM & MAG HYDROXIDE-SIMETH 200-200-20 MG/5ML PO SUSP: 30.0000 mL | ORAL | Status: DC | PRN

## 2019-09-10 MED ORDER — SODIUM CHLORIDE 0.9 % IV SOLN: INTRAVENOUS | Status: DC | PRN

## 2019-09-10 MED ORDER — ONDANSETRON HCL 4 MG PO TABS
4.0000 mg | ORAL_TABLET | Freq: Four times a day (QID) | ORAL | Status: DC | PRN
  Administered 2019-09-10: 4 mg via ORAL
  Filled 2019-09-10: qty 1

## 2019-09-10 MED ORDER — MIDAZOLAM HCL 5 MG/5ML IJ SOLN
INTRAMUSCULAR | Status: DC | PRN
  Administered 2019-09-10: 1 mg via INTRAVENOUS

## 2019-09-10 MED ORDER — GUAIFENESIN-DM 100-10 MG/5ML PO SYRP: 5.0000 mL | ORAL_SOLUTION | Freq: Four times a day (QID) | ORAL | Status: DC | PRN

## 2019-09-10 MED ORDER — OXYCODONE HCL 5 MG PO TABS
5.0000 mg | ORAL_TABLET | Freq: Four times a day (QID) | ORAL | Status: DC | PRN
  Administered 2019-09-10 – 2019-09-14 (×9): 5 mg via ORAL
  Filled 2019-09-10 (×11): qty 1

## 2019-09-10 MED ORDER — POLYETHYLENE GLYCOL 3350 17 G PO PACK: 17.0000 g | PACK | Freq: Every day | ORAL | Status: DC | PRN

## 2019-09-10 MED ORDER — VITAMIN D 25 MCG (1000 UNIT) PO TABS
2000.0000 [IU] | ORAL_TABLET | Freq: Two times a day (BID) | ORAL | Status: DC
  Administered 2019-09-10 – 2019-09-28 (×36): 2000 [IU] via ORAL
  Filled 2019-09-10 (×39): qty 2

## 2019-09-10 MED ORDER — MELATONIN 3 MG PO TABS
6.0000 mg | ORAL_TABLET | Freq: Every evening | ORAL | Status: DC | PRN
  Filled 2019-09-10: qty 2

## 2019-09-10 MED ORDER — ONDANSETRON HCL 4 MG/2ML IJ SOLN
INTRAMUSCULAR | Status: DC | PRN
  Administered 2019-09-10: 4 mg via INTRAVENOUS

## 2019-09-10 MED ORDER — MAGIC MOUTHWASH
5.0000 mL | Freq: Four times a day (QID) | ORAL | Status: DC | PRN
  Filled 2019-09-10: qty 5

## 2019-09-10 MED ORDER — FENTANYL CITRATE (PF) 250 MCG/5ML IJ SOLN
INTRAMUSCULAR | Status: DC | PRN
  Administered 2019-09-10: 100 ug via INTRAVENOUS

## 2019-09-10 MED ORDER — ACETAMINOPHEN 325 MG PO TABS
325.0000 mg | ORAL_TABLET | Freq: Four times a day (QID) | ORAL | Status: DC | PRN
  Administered 2019-09-10 – 2019-09-17 (×3): 650 mg via ORAL
  Filled 2019-09-10 (×4): qty 2

## 2019-09-10 MED ORDER — PRO-STAT SUGAR FREE PO LIQD
30.0000 mL | Freq: Two times a day (BID) | ORAL | Status: DC
  Administered 2019-09-12 – 2019-09-19 (×2): 30 mL via ORAL
  Filled 2019-09-10 (×31): qty 30

## 2019-09-10 NOTE — Progress Notes (Signed)
Physical Therapy Session Note  Patient Details  Name: [name] MRN: [id number] Date of Birth: 01/14/1961  Today's Date: 09/10/2019 PT Individual Time: 1035-1130 AND 1430-1440 PT Individual Time Calculation (min): 55 min and 10 min   Short Term Goals: Week 2:  PT Short Term Goal 1 (Week 2): Pt will ambulate 21f with mod assist and LRAD PT Short Term Goal 2 (Week 2): Pt will performed sit<>stand with min assist PT Short Term Goal 3 (Week 2): Pt will propell WC >1527fwith supervision assist  Skilled Therapeutic Interventions/Progress Updates:  Session 1.  PT spoke with PA and received verbal orders for bed level therapy for today 09/10/2019, following procedure, and progressing to full therapies 09/11/2019. Pt received supine in bed and agreeable to PT. Pt noted to have CPM for BLE from 0-90 deg. Pt reports that he would like to continue to allow CPM to run and focus therapies on LUE. PT instructed pt in AAROM for L wrist extension/flexion as well as supination pronation; each completed 3 x 15 with overpressure from PT into extension and supination. 2lb bar weight with AAROM from RUE to perform BUE chest press, shoulder flexion, elbow flexion; 2 x 15. PT required to provide  Max assist to sustain grasp on the LUE throughout each as well as moderate cues improved elbow flexion. Unilateral shoulder extension/scapular protraction 2 x 10 BUE  PT performed PROM/AAROM for shoulder flexion 2 x 10 within pain free range and 3 x 30sec A/P grade 2 mobilizations to shoulder to improve ROM. Shoulder ROM improved from ~65 deg to ~95 degree following manual therapy.   Pt left in bed with call bell in reach and all needs met.   Session 2.   Pt received supine in bed and reports extreme BLE pain and fatigue. PT assisted pt to reposition in bed with max assist using chuck pad and perform partial roll to adjust bedding. Max assist from PT on this day with moderate cues for set up due to pain in BLE. Pt left  in bed with call bell in reach and all needs met. .     Therapy Documentation General: PT Amount of Missed Time (min): 35 Minutes PT Missed Treatment Reason: Patient fatigue;Pain Vital Signs: Therapy Vitals Temp: 98.5 F (36.9 C) Pulse Rate: (!) 105 Resp: 16 BP: (!) 142/109 Patient Position (if appropriate): Lying Oxygen Therapy SpO2: 99 % O2 Device: Room Air Pain: Pain Assessment Pain Scale: 0-10 Pain Score: 10-Worst pain ever Faces Pain Scale: Hurts worst Pain Type: Acute pain;Surgical pain Pain Location: Knee Pain Orientation: Left;Right Pain Descriptors / Indicators: Aching;Throbbing Pain Frequency: Intermittent Pain Onset: Gradual Pain Intervention(s): Medication (See eMAR)    Therapy/Group: Individual Therapy  AuLorie Phenix2/01/2019, 2:51 PM

## 2019-09-10 NOTE — Anesthesia Procedure Notes (Signed)
Procedure Name: General with mask airway Date/Time: 09/10/2019 7:27 AM Performed by: Mariea Clonts, CRNA Pre-anesthesia Checklist: Timeout performed, Patient being monitored, Suction available, Emergency Drugs available and Patient identified Patient Re-evaluated:Patient Re-evaluated prior to induction Oxygen Delivery Method: Circle system utilized and Simple face mask Preoxygenation: Pre-oxygenation with 100% oxygen Induction Type: IV induction Ventilation: Mask ventilation without difficulty and Oral airway inserted - appropriate to patient size Placement Confirmation: positive ETCO2 and breath sounds checked- equal and bilateral

## 2019-09-10 NOTE — Progress Notes (Signed)
Speech Language Pathology Weekly Progress and Session Note  Patient Details  Name: [name] MRN: [id number] Date of Birth: [birth year]  Beginning of progress report period: September 03, 2019 End of progress report period: September 10, 2019  Today's Date: 09/10/2019 SLP Missed Time: 75 Minutes Missed Time Reason: Pain  Short Term Goals: Week 1: SLP Short Term Goal 1 (Week 1): Pt will consume dys 3 textures and thin liquid diet with mod I use of swallow strategies and no overt s/s aspiration. - Discontinued SLP Short Term Goal 2 (Week 1): Pt will consume regular texture trials with appropriate mastication and oral clearnce in x3 sessions prior to soild upgrade with no overt s/s aspiration. - Discontinued, not appropriate at this time SLP Short Term Goal 3 (Week 1): Pt will paricipate in assessment of higher level cognitive and executive function skills. - Goal met SLP Short Term Goal 4 (Week 1): Pt will complete 3 sets of 10 RMT exercises to increase respiratory support for speech and swallowing. SLP Short Term Goal 4 - Progress (Week 1): Progressing toward goal SLP Short Term Goal 5 (Week 1): Pt will consume current diet with minimal overt s/s of aspiration or dysphagia. SLP Short Term Goal 5 - Progress (Week 1): Progressing toward goal SLP Short Term Goal 6 - Progress (Week 1): Met    New Short Term Goals: Week 2: SLP Short Term Goal 1 (Week 2): Pt will participate in ongoing assessment of compensatory strategies at bedside to decrease overt s/s of aspiration with thin liquids. SLP Short Term Goal 2 (Week 2): Pt will consume trials of dysphagia 3 with Min A cues for liquid wash and effortful swallow to reduce pharyngeal reduce. SLP Short Term Goal 3 (Week 2): Pt will complete 3 sets of 10 pharyngeal strengthening exercises specifically targeting base of tongue strength with Min A cues. SLP Short Term Goal 4 (Week 2): Pt will complete 3 sets of 15 resp of EMST and 3 sets of 10 reps of  IMST with MIn A cues and self-perceived effort of 8 out of 10. SLP Short Term Goal 5 (Week 2): Pt will incresae vocal intensity at the conversation level to convey abstract information in noisy environment to achive ~ 90% intelligibility with supervision level cues.  Weekly Progress Updates: Pt continued to exhibit overt s/s of dysphagia and aspiration at bedside. Therefore MBS was completed on 09/10/19 that revealed good airway protection when consuming thin barium but then coughing episode noted immediately upon completion of MBS when consuming thin water via cup in radiology chair. Pt has also started to complete RMT with improved vocal intensity has been noted.Skilled ST continues to be indicated to target the above mentioned goals.      Intensity: Minumum of 1-2 x/day, 30 to 90 minutes Frequency: 3 to 5 out of 7 days Duration/Length of Stay: 09/21/19 Treatment/Interventions: Patient/family education;Dysphagia/aspiration precaution training;Therapeutic Exercise;Speech/Language facilitation   Daily Session  Skilled Therapeutic Interventions: Pt with ortho procedure in OR earlier in the day. As a result, pt in pain and unable to participate in therapy session this afternoon. Pt missed 30 minutes of skilled ST.     General    Pain Pain Assessment Pain Scale: 0-10 Pain Score: 10-Worst pain ever Faces Pain Scale: Hurts worst Pain Type: Acute pain;Surgical pain Pain Location: Knee Pain Orientation: Left;Right Pain Descriptors / Indicators: Aching;Throbbing Pain Frequency: Intermittent Pain Onset: Gradual Pain Intervention(s): Medication (See eMAR)  Therapy/Group: Individual Therapy  Zierra Laroque 09/10/2019, 2:12 PM

## 2019-09-10 NOTE — Progress Notes (Addendum)
Occupational Therapy Session Note  Patient Details  Name: LAMAJ TOEPFER MRN: [id number] Date of Birth: 1960-12-13  Today's Date: 09/10/2019 60 minutes missed     Skilled Therapeutic Interventions/Progress Updates:    RN greeted at pts door, stating that PA has requested to hold therapies for the day due to surgery this AM. 60 minutes missed.   Therapy Documentation Vital Signs: Therapy Vitals Temp: 98.1 F (36.7 C) Pulse Rate: 83 Resp: 14 BP: (!) 136/98 Oxygen Therapy SpO2: 99 % Pain: Pain Assessment Pain Score: 0-No pain Faces Pain Scale: No hurt ADL:       Therapy/Group: Individual Therapy  River Ambrosio A Kimberlly Norgard 09/10/2019, 12:01 PM
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
## 2019-09-10 NOTE — Transfer of Care (Signed)
Immediate Anesthesia Transfer of Care Note  Patient: [name] [facility]  Procedure(s) Performed: CLOSED MANIPULATION KNEE (Right Knee)  Patient Location: PACU  Anesthesia Type:General  Level of Consciousness: awake, alert  and oriented  Airway & Oxygen Therapy: Patient Spontanous Breathing and Patient connected to nasal cannula oxygen  Post-op Assessment: Report given to RN and Post -op Vital signs reviewed and stable  Post vital signs: Reviewed and stable  Last Vitals:  Vitals Value Taken Time  BP 114/84 09/10/19 0744  Temp    Pulse 81 09/10/19 0747  Resp 15 09/10/19 0747  SpO2 100 % 09/10/19 0747  Vitals shown include unvalidated device data.  Last Pain: There were no vitals filed for this visit.       Complications: No apparent anesthesia complications

## 2019-09-10 NOTE — Progress Notes (Signed)
Patient able to tolerate CPM  bilaterally for two hours during shift, he is willing to put it on later today. Adria Devon, LPN.
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
## 2019-09-10 NOTE — Progress Notes (Signed)
Orthopedic Tech Progress Note Patient Details:  Spero Wehe [facility] 1961/02/24 [id number]  CPM Left Knee CPM Left Knee: On Left Knee Flexion (Degrees): 100 Left Knee Extension (Degrees): 0 CPM Right Knee CPM Right Knee: On Right Knee Flexion (Degrees): 100 Right Knee Extension (Degrees): 0     [name] 09/10/2019, 9:35 AM

## 2019-09-10 NOTE — Anesthesia Postprocedure Evaluation (Signed)
Anesthesia Post Note  Patient: [name]  Procedure(s) Performed: CLOSED MANIPULATION KNEE (Right Knee)     Patient location during evaluation: PACU Anesthesia Type: General Level of consciousness: awake and alert Pain management: pain level controlled Vital Signs Assessment: post-procedure vital signs reviewed and stable Respiratory status: spontaneous breathing, nonlabored ventilation and respiratory function stable Cardiovascular status: blood pressure returned to baseline and stable Postop Assessment: no apparent nausea or vomiting Anesthetic complications: no    Last Vitals:  Vitals:   09/10/19 0828 09/10/19 0830  BP: (!) 136/98   Pulse: 83 83  Resp: 15 14  Temp:  36.7 C  SpO2: 98% 99%    Last Pain:  Vitals:   09/10/19 0830  PainSc: 0-No pain                 Lynda Rainwater
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
## 2019-09-10 NOTE — Progress Notes (Signed)
Patient returned from having Closed Manipulation  Of bilateral knees, patient currently resting in bed at time. Vitals taken upon arrival to unit, complains of bilateral knees. Pain medication given when available. Will continue to monitor for changes. Adria Devon, LPN

## 2019-09-10 NOTE — Progress Notes (Signed)
Patient was accidentally discharged from our unit in the computer by night shift when he went to the OR this morning instead of transferring to the OR.  He was also discharged from PACU.  Admitting was able to help up get him admitted back to our unit but all of his orders were lost.  RN was able to pull up last encounter and see previous orders, and thus re-ordered them.  Notified PA of situation as well.  Pam to check over current orders and add/delete as necessary.  Admission date has now been changed to reflect todays date instead of previous admit date.  Will update the team.  Brita Romp, RN

## 2019-09-10 NOTE — Consult Note (Signed)
[place] Nurse ostomy follow up Patient receiving care in [facility] 4W24.   Stoma type/location: RUQ ileostomy Stomal assessment/size: 7/8 inches, red, moist, producing yellow/brown effluent Peristomal assessment: deferred Treatment options for stomal/peristomal skin: currently has on a flat two piece system without evidence of washout or impending leakage.  Patient states his wife put the system on yesterday with guidance from bedside RN staff.  His wife normally works [employer]uary, [employer], [place], [employer].  He stated she should be able to come in next week on Wednesday between the hours of 0800 -1600 for WOC teaching and support.  He refused to have a teaching session today.  He wants his spouse present.  He does not have full use of the LUE, he can make some gross motor movements, but cannot use the hand.  Also, he wears glasses and does not have those with him.  He cannot read the materials in the education folder.  I placed it in the second drawer of his large supply cabinet along with the extra ostomy supplies that were already present. Output, as above Ostomy pouching: 1pc./2pc. I have placed an order for a one piece convex pouching system Kellie Simmering [phone]) as well as a two piece flat system Kellie Simmering [phone] for the skin barrier, and Kellie Simmering #[id number] for the pouch) and barrier rings, Kellie Simmering [phone]. Education provided:  None, patient deferred today Enrolled patient in [place] Discharge program: Yes I spoke with the Charge Nurse and explained what happened, and the need to have the spouse come in Wednesday next week during the hours of 0800-1600.  She volunteered to follow up.  Val Riles, RN, MSN, CWOCN, CNS-BC, pager [phone]
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
## 2019-09-10 NOTE — Plan of Care (Signed)
  Problem: Consults Goal: RH STROKE PATIENT EDUCATION Description: See Patient Education module for education specifics  Outcome: Progressing   Problem: RH BOWEL ELIMINATION Goal: RH STG MANAGE BOWEL WITH ASSISTANCE Description: STG Manage Bowel with Mod Assistance. Outcome: Progressing Goal: RH STG MANAGE BOWEL W/MEDICATION W/ASSISTANCE Description: STG Manage Bowel with Medication with Mod Assistance. Outcome: Progressing   Problem: RH BLADDER ELIMINATION Goal: RH STG MANAGE BLADDER WITH ASSISTANCE Description: STG Manage Bladder With Min Assistance Outcome: Progressing   Problem: RH SKIN INTEGRITY Goal: RH STG SKIN FREE OF INFECTION/BREAKDOWN Description: No new breakdown with min assist  Outcome: Progressing Goal: RH STG ABLE TO PERFORM INCISION/WOUND CARE W/ASSISTANCE Description: STG Able To Perform Incision/Wound Care With Mod Assistance. Outcome: Progressing   Problem: RH COGNITION-NURSING Goal: RH STG ANTICIPATES NEEDS/CALLS FOR ASSIST W/ASSIST/CUES Description: STG Anticipates Needs/Calls for Assist With Min Assistance/Cues. Outcome: Progressing   Problem: RH PAIN MANAGEMENT Goal: RH STG PAIN MANAGED AT OR BELOW PT'S PAIN GOAL Description: < 4 out of 10.  Outcome: Progressing   Problem: RH KNOWLEDGE DEFICIT Goal: RH STG INCREASE KNOWLEDGE OF DYSPHAGIA/FLUID INTAKE Description: Pt will demonstrate proper swallowing techniques and will be able to verbalize strategies to manage dysphagia while in rehab with min assist.  Outcome: Progressing

## 2019-09-10 NOTE — Anesthesia Preprocedure Evaluation (Signed)
Anesthesia Evaluation  Patient identified by MRN, date of birth, ID band Patient unresponsive    Reviewed: Allergy & Precautions, NPO status , Patient's Chart, lab work & pertinent test results, Unable to perform ROS - Chart review only  Airway Mallampati: Intubated  TM Distance: >3 FB Neck ROM: Full    Dental no notable dental hx.    Pulmonary    Pulmonary exam normal breath sounds clear to auscultation       Cardiovascular hypertension, Pt. on medications Normal cardiovascular exam Rhythm:Regular Rate:Normal     Neuro/Psych Anxiety Depression    GI/Hepatic   Endo/Other    Renal/GU On CCRT     Musculoskeletal   Abdominal (+) + obese,   Peds  Hematology   Anesthesia Other Findings   Reproductive/Obstetrics                             Anesthesia Physical  Anesthesia Plan  ASA: III  Anesthesia Plan: General   Post-op Pain Management:    Induction: Intravenous  PONV Risk Score and Plan: 2 and Treatment may vary due to age or medical condition, Ondansetron and Midazolam  Airway Management Planned: Mask  Additional Equipment:   Intra-op Plan:   Post-operative Plan:   Informed Consent: I have reviewed the patients History and Physical, chart, labs and discussed the procedure including the risks, benefits and alternatives for the proposed anesthesia with the patient or authorized representative who has indicated his/her understanding and acceptance.     Dental advisory given  Plan Discussed with: CRNA and Surgeon  Anesthesia Plan Comments:         Anesthesia Quick Evaluation

## 2019-09-11 ENCOUNTER — Encounter (HOSPITAL_COMMUNITY): Payer: Self-pay | Admitting: Student

## 2019-09-11 NOTE — Plan of Care (Signed)
  Problem: Consults Goal: RH STROKE PATIENT EDUCATION Description: See Patient Education module for education specifics  Outcome: Progressing   Problem: RH BOWEL ELIMINATION Goal: RH STG MANAGE BOWEL WITH ASSISTANCE Description: STG Manage Bowel with mod Assistance. Outcome: Progressing Goal: RH STG MANAGE BOWEL W/MEDICATION W/ASSISTANCE Description: STG Manage Bowel with Medication with mod I Assistance. Outcome: Progressing   Problem: RH BLADDER ELIMINATION Goal: RH STG MANAGE BLADDER WITH ASSISTANCE Description: STG Manage Bladder With min Assistance Outcome: Progressing   Problem: RH SKIN INTEGRITY Goal: RH STG SKIN FREE OF INFECTION/BREAKDOWN Description: Patients skin will remain free from further infection or breakdown with min assist. Outcome: Progressing Goal: RH STG MAINTAIN SKIN INTEGRITY WITH ASSISTANCE Description: STG Maintain Skin Integrity With min Assistance. Outcome: Progressing Goal: RH STG ABLE TO PERFORM INCISION/WOUND CARE W/ASSISTANCE Description: STG Able To Perform Incision/Wound Care With mod Assistance. Outcome: Progressing   Problem: RH SAFETY Goal: RH STG ADHERE TO SAFETY PRECAUTIONS W/ASSISTANCE/DEVICE Description: STG Adhere to Safety Precautions With mod I Assistance/Device. Outcome: Progressing   Problem: RH PAIN MANAGEMENT Goal: RH STG PAIN MANAGED AT OR BELOW PT'S PAIN GOAL Description: < 4 Outcome: Progressing   Problem: RH KNOWLEDGE DEFICIT Goal: RH STG INCREASE KNOWLEDGE OF DYSPHAGIA/FLUID INTAKE Description: Min assist Outcome: Progressing Goal: RH STG INCREASE KNOWLEDGE OF STROKE PROPHYLAXIS Description: Patient/caregiver will verbalize understanding of stroke prophylaxis including monitoring, diet, exercise, and medications with min assist. Outcome: Progressing

## 2019-09-11 NOTE — Progress Notes (Signed)
Pt wife at bedside and able to perform wet to dry dressing to abdominal wound. Patient's wife also emptied patient's ostomy. Performed well. All questions answered. Wound clean and pink. Patient on CPMs at 0915 at 90 degrees. At approximately 1010 patient up to 100 degree to bilateral knees. Tolerating well.

## 2019-09-11 NOTE — Progress Notes (Signed)
SPORTS MEDICINE AND JOINT REPLACEMENT  Lara Mulch, MD    Carlyon Shadow, PA-C [address], [place], [place]  [address]                             [phone]   PROGRESS NOTE  Subjective:  negative for Chest Pain  negative for Shortness of Breath  negative for Nausea/Vomiting   negative for Calf Pain  negative for Bowel Movement   Tolerating Diet: yes         Patient reports pain as 3 on 0-10 scale.    Objective: Vital signs in last 24 hours:    Patient Vitals for the past 24 hrs:  BP Temp Temp src Pulse Resp SpO2 Weight  09/11/19 0400 - - - - - - 92.5 kg  09/11/19 0341 133/86 98.3 F (36.8 C) - 82 19 100 % -  09/10/19 1953 (!) 126/92 98.3 F (36.8 C) - 88 19 99 % -  09/10/19 1835 (!) 123/95 - - - - - -  09/10/19 1649 (!) 131/94 98.9 F (37.2 C) Oral 91 - 100 % -  09/10/19 1258 (!) 142/109 98.5 F (36.9 C) - (!) 105 16 99 % -    @flow {1959:LAST@   Intake/Output from previous day:   12/04 0701 - 12/05 0700 In: 1150 [P.O.:550; I.V.:500] Out: 1750 [Urine:1225]   Intake/Output this shift:   No intake/output data recorded.   Intake/Output      12/04 0701 - 12/05 0700 12/05 0701 - 12/06 0700   P.O. 550    I.V. (mL/kg) 500 (5.4)    Other 100    Total Intake(mL/kg) 1150 (12.4)    Urine (mL/kg/hr) 1225 (0.6)    Stool 525    Total Output 1750    Net -600         Stool Occurrence 1 x       LABORATORY DATA: Recent Labs    09/06/19 0304  WBC 6.3  HGB 8.6*  HCT 27.6*  PLT 413*   Recent Labs    09/06/19 0304  NA 133*  K 4.2  CL 97*  CO2 26  BUN 26*  CREATININE 1.01  GLUCOSE 91  CALCIUM 9.6   Lab Results  Component Value Date   INR 1.1 08/03/2019   INR 2.0 (H) 07/31/2019   INR 2.0 (H) 07/31/2019    Examination:  General appearance: alert, cooperative and no distress Extremities: extremities normal, atraumatic, no cyanosis or edema  Wound Exam: clean, dry, intact   Drainage:  None: wound tissue dry  Motor Exam: Quadriceps and  Hamstrings Intact  Sensory Exam: Superficial Peroneal, Deep Peroneal and Tibial normal   Assessment:    1 Day Post-Op  Procedure(s) (LRB): CLOSED MANIPULATION KNEE (Right)  ADDITIONAL DIAGNOSIS:  Principal Problem:   Bilateral knee contractures     Plan: Physical Therapy as ordered Weight Bearing as Tolerated (WBAT)  Patient currently in  Bilateral CPM machines at 90 degrees and progressing ROM as tolerated. Stable from ortho standpoint.   Donia Ast 09/11/2019, 9:18 AM
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
## 2019-09-12 ENCOUNTER — Inpatient Hospital Stay (HOSPITAL_COMMUNITY): Payer: 59

## 2019-09-12 ENCOUNTER — Inpatient Hospital Stay (HOSPITAL_COMMUNITY): Payer: 59 | Admitting: Speech Pathology

## 2019-09-12 NOTE — Progress Notes (Signed)
Occupational Therapy Session Note  Patient Details  Name: [name] MRN: [id number] Date of Birth: [birth year]  Today's Date: 09/12/2019 OT Individual Time: 1430-1540 OT Individual Time Calculation (min): 70 min   Skilled Therapeutic Interventions/Progress Updates:    Pt received supine in bed with c/o 8/10 pain in R knee from CPM machine. Pt premedicated and OOB mobility used as pain relief. Pt completed bed mobility to EOB with (S). Pt compelted lateral scoot to w/c from EOB with CGA. Pt doffed shirt with min cueing and (S). Pt completed UB bathing with min A. Pt encouraged to complete forced use/HOH if needed for LUE. Pt donned shirt with min A. Pt was transported to the therapy gym with total A. Pt completed lateral scoot to the mat with CGA. Pt sat EOM and completed gentle AAROM to the BUE, facilitation provided to the LUE. Pt then completed B knee flex/ext with sliders to promote increased AROM at the knee. Pt with very limited ROM and in high amounts of pain so activity graded within pain tolerance. Pt returned to w/c and was taken back to the room. [name] assisted with ostomy bag emptying- able to manage with set up assist only. Pt returned to bed with min cueing for positioning. Pt requesting to change brief at bed level. [name] instructed in positioning of brief and strategies to increase ease. Pt was left supine with all needs met, wife present.   Therapy Documentation Precautions:  Restrictions Weight Bearing Restrictions: Yes LUE Weight Bearing: Weight bear through elbow only RLE Weight Bearing: Weight bearing as tolerated(left elbow WBAT , left wrist NO weight bear)   Therapy/Group: Individual Therapy  [name] 09/12/2019, 7:35 AM

## 2019-09-12 NOTE — Plan of Care (Signed)
  Problem: Consults Goal: RH STROKE PATIENT EDUCATION Description: See Patient Education module for education specifics  Outcome: Progressing   Problem: RH BOWEL ELIMINATION Goal: RH STG MANAGE BOWEL WITH ASSISTANCE Description: STG Manage Bowel with mod Assistance. Outcome: Progressing Goal: RH STG MANAGE BOWEL W/MEDICATION W/ASSISTANCE Description: STG Manage Bowel with Medication with mod I Assistance. Outcome: Progressing   Problem: RH BLADDER ELIMINATION Goal: RH STG MANAGE BLADDER WITH ASSISTANCE Description: STG Manage Bladder With min Assistance Outcome: Progressing   Problem: RH SKIN INTEGRITY Goal: RH STG SKIN FREE OF INFECTION/BREAKDOWN Description: Patients skin will remain free from further infection or breakdown with min assist. Outcome: Progressing Goal: RH STG MAINTAIN SKIN INTEGRITY WITH ASSISTANCE Description: STG Maintain Skin Integrity With min Assistance. Outcome: Progressing Goal: RH STG ABLE TO PERFORM INCISION/WOUND CARE W/ASSISTANCE Description: STG Able To Perform Incision/Wound Care With mod Assistance. Outcome: Progressing   Problem: RH SAFETY Goal: RH STG ADHERE TO SAFETY PRECAUTIONS W/ASSISTANCE/DEVICE Description: STG Adhere to Safety Precautions With mod I Assistance/Device. Outcome: Progressing   Problem: RH KNOWLEDGE DEFICIT Goal: RH STG INCREASE KNOWLEDGE OF DYSPHAGIA/FLUID INTAKE Description: Min assist Outcome: Progressing Goal: RH STG INCREASE KNOWLEDGE OF STROKE PROPHYLAXIS Description: Patient/caregiver will verbalize understanding of stroke prophylaxis including monitoring, diet, exercise, and medications with min assist. Outcome: Progressing

## 2019-09-12 NOTE — Progress Notes (Addendum)
[place] PHYSICAL MEDICINE & REHABILITATION PROGRESS NOTE   Subjective/Complaints: Patient went up to 115 degrees on the right side on the CPM.  His right knee is sore today.  He got up to 115 on the left side as well but is not sore on that side. He is asking about the flu shot.  We discussed that he is not on any immunosuppressive medications and he would be at risk for complications if he were to get flu. Wife in the room concerned that she has some bad days related to dizziness, she would like patient's brother to be trained as well for ileostomy bag changes, discussed that home health nursing can do this. ROS- neg CP, SOB,  N/V/D; no abd pain   Objective:   No results found. No results for input(s): WBC, HGB, HCT, PLT in the last 72 hours. No results for input(s): NA, K, CL, CO2, GLUCOSE, BUN, CREATININE, CALCIUM in the last 72 hours.  Intake/Output Summary (Last 24 hours) at 09/12/2019 1053 Last data filed at 09/12/2019 0900 Gross per 24 hour  Intake 660 ml  Output 1405 ml  Net -745 ml     Physical Exam: Vital Signs Blood pressure (!) 136/92, pulse 95, temperature 98.5 F (36.9 C), resp. rate 19, weight 93 kg, SpO2 98 %. Vitals reviewed and labs reviewed Constitutional: up in manual w/c, wife at side; with PT; cortrak out; looks MUCH better; good color,  NAD;  appears flat/tearful after therapy when saw 2nd time HENT:  Head: Normocephalic and atraumatic.  +NG  Eyes: EOM are normal.  Neck: No tracheal deviation present. No thyromegaly present. Cortrak as above Respiratory: Effort normal. No respiratory distress.  GI: He exhibits no distension.  +Ileostomy -NT, ND Musculoskeletal:     Comments:   + right  knee tenderness over quad tendon with PROM knee flexion No evidence of knee effusion bilaterally , no erythema around knees or ecchymosis, no jt line tenderness Neurological: He is alert and oriented to person, place, and time.  Motor: Right upper extremity: 4 -/5  proximal distal Left upper extremity: Shoulder abduction, elbow flexion/extension 2+/5, handgrip trace  right lower extremity is in the CPM and cannot be tested. Left lower extremity has 3 - at the hip flexors knee extensors 4 - and ankle dorsiflexors 3 - Sensation intact light touch Dysarthria Left facial weakness  MSK limited right knee is going to 70 degrees on CPM. Psychiatric: Flat no lability or agitation   Assessment/Plan: 1. Functional deficits secondary to acute brainstem infarct which require 3+ hours per day of interdisciplinary therapy in a comprehensive inpatient rehab setting.  Physiatrist is providing close team supervision and 24 hour management of active medical problems listed below.  Physiatrist and rehab team continue to assess barriers to discharge/monitor patient progress toward functional and medical goals  Care Tool:  Bathing              Bathing assist       Upper Body Dressing/Undressing Upper body dressing        Upper body assist      Lower Body Dressing/Undressing Lower body dressing            Lower body assist       Toileting Toileting    Toileting assist Assist for toileting: Minimal Assistance - Patient > 75% Assistive Device Comment: urinal   Transfers Chair/bed transfer  Transfers assist           Locomotion Ambulation   Ambulation  assist              Walk 10 feet activity   Assist           Walk 50 feet activity   Assist           Walk 150 feet activity   Assist           Walk 10 feet on uneven surface  activity   Assist           Wheelchair     Assist               Wheelchair 50 feet with 2 turns activity    Assist            Wheelchair 150 feet activity     Assist          Blood pressure (!) 136/92, pulse 95, temperature 98.5 F (36.9 C), resp. rate 19, weight 93 kg, SpO2 98 %.  Medical Problem List and Plan: 1.  Deficits with  mobility, endurance, self-care secondary to acute right paramedian pontine infarct and polytrauma.                          -ELOS 12/15            CIR Level PT , OT , SLP  2.  Antithrombotics: -DVT/anticoagulation:  Pharmaceutical: Heparin doppler neg for DVT              -antiplatelet therapy: ASA 3. Pain Management: tylenol for mild and oxycodone for severe pain.              Monitor with increased mobility Doesn't like how pain meds make him feel  4. Mood: LCSW to follow for support and evaluation. Team to provide ego support             -antipsychotic agents: N/A.  5. Neuropsych: This patient is capable of making decisions on his own behalf. 6. Skin/Wound Care: routine pressure relief measures.  7. Fluids/Electrolytes/Nutrition: Monitor I/O. Encourage intake--he's afraid to eat due to concerns of return of abdominal pain. Change TF to nights to promote hunger.          BMP in a.m.   8. Right pontine stroke: On ASA for secondary stroke prevention.  9. Mesenteric injuries s/p Ileocecectomy, total colectomy and repair of lacerations: Foley to collect high volume out of ileostomy."liquidy stools expected given lack of lg colon  Lomotil prn 10. Fistula/leak/abscess: completed  Zosyn 11/15-->08/2910. Episode of SVT: Resolved with BB             Monitor with increased mobility. 12. Situational depression with insomnia: Continue Seroquel at nights.   13. Right knee pain: Question MRI for workup.  14. ORIF left clavicle and left distal radius fracture: NWB left wrist--may WB thorough elbow with platform walker. 15.  IM nailing right femur and ORIF right bimalleolar Fx: .  16. Anemia:likely ABLA  11/30 CBC stable, repeat in a.m. 17.  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 He has CPM ordered from 0 to 90, was ranged under GA to 125 deg, patient increased rapidly on the right side and has soreness  related to this.  On the left side he went up to 115 degrees.  He has goals of 125 degrees.  Recommend that he increase his by no more than 10 degrees/day on the  CPM degrees.  Continue aggressive rehab efforts 18.  Left MCP, PIP and DIP contracture- extention, will ask OT to perform passive flexion of fingers   19.  Papular rash upper chest and thighs, non pruitic, does not look infectious, likely either med allergy or contact dermatitis, now off piperacillin , NKDA Monitor and treat sympromatically , ask pharmacy to review med list  Order benadryl cream   20.  Dysphagia post stroke, d/w SLP should have normal sensation due to pontine location, may have anxiety component LOS: 2 days A FACE TO FACE EVALUATION WAS PERFORMED  Charlett Blake 09/12/2019, 10:53 AM

## 2019-09-12 NOTE — Progress Notes (Signed)
Speech Language Pathology Daily Session Note  Patient Details  Name: [name] MRN: [id number] Date of Birth: Aug 28, 1961  Today's Date: 09/12/2019 SLP Individual Time: [id number] SLP Individual Time Calculation (min): 35 min  Short Term Goals: Week 2: SLP Short Term Goal 1 (Week 2): Pt will participate in ongoing assessment of compensatory strategies at bedside to decrease overt s/s of aspiration with thin liquids. SLP Short Term Goal 2 (Week 2): Pt will consume trials of dysphagia 3 with Min A cues for liquid wash and effortful swallow to reduce pharyngeal reduce. SLP Short Term Goal 3 (Week 2): Pt will complete 3 sets of 10 pharyngeal strengthening exercises specifically targeting base of tongue strength with Min A cues. SLP Short Term Goal 4 (Week 2): Pt will complete 3 sets of 15 resp of EMST and 3 sets of 10 reps of IMST with MIn A cues and self-perceived effort of 8 out of 10. SLP Short Term Goal 5 (Week 2): Pt will incresae vocal intensity at the conversation level to convey abstract information in noisy environment to achive ~ 90% intelligibility with supervision level cues.  Skilled Therapeutic Interventions: Pt was seen for skilled ST targeting dysphagia exercises and speech intelligibility. [name] was present and supportive at bedside. Pt was somewhat limited by knee pain and fatigue from recently adminitered pain medicine, however agreeable to participate in [place] while laying in bed. Pt's knee had to remain elevated and therefore could not be repositioned for safe PO intake, therefore SLP introduced Masako Maneuver to target base of tongue strengthening (with dry swallows). Pt returned demonstration of 49 X2 however reported he had difficulty participating further due to xerostomia and positioning. SLP left written handout with Masako instructions and recommendation to compelte 10 reps 3X daily on pt's bedside table. SLP further facilitated session with Min A verbal cues for  repetition using increased vocal intensity during a sentence level barrier speech task. Pt was ~90% intelligible at the sentence level throughout task, although intelligibility decreased to ~80-85% in informal conversation. Pt became increasingly fatigued throughout session and unable to maintain arousal for further participation. Pt missed 10 minutes of skilled ST. Pt left laying in bed with alarm set, needs within reach and wife present. Continue per current plan of care.       Pain Pain Assessment Pain Scale: 0-10 Pain Score: 10-Worst pain ever Pain Type: Acute pain Pain Location: Knee Pain Orientation: Right Pain Descriptors / Indicators: Sore Pain Onset: Gradual Pain Intervention(s): Medication (See eMAR);Cold applied  Therapy/Group: Individual Therapy  Arbutus Leas 09/12/2019, 10:17 AM

## 2019-09-13 ENCOUNTER — Inpatient Hospital Stay (HOSPITAL_COMMUNITY): Payer: 59 | Admitting: Occupational Therapy

## 2019-09-13 ENCOUNTER — Inpatient Hospital Stay (HOSPITAL_COMMUNITY): Payer: 59

## 2019-09-13 DIAGNOSIS — M24562 Contracture, left knee: Secondary | ICD-10-CM

## 2019-09-13 DIAGNOSIS — M24561 Contracture, right knee: Secondary | ICD-10-CM

## 2019-09-13 LAB — BASIC METABOLIC PANEL
Anion gap: 11 (ref 5–15)
BUN: 27 mg/dL — ABNORMAL HIGH (ref 6–20)
CO2: 25 mmol/L (ref 22–32)
Calcium: 10.1 mg/dL (ref 8.9–10.3)
Chloride: 95 mmol/L — ABNORMAL LOW (ref 98–111)
Creatinine, Ser: 0.94 mg/dL (ref 0.61–1.24)
GFR calc Af Amer: >60 mL/min (ref >60)
GFR calc non Af Amer: >60 mL/min (ref >60)
Glucose, Bld: 97 mg/dL (ref 70–99)
Potassium: 4.8 mmol/L (ref 3.5–5.1)
Sodium: 131 mmol/L — ABNORMAL LOW (ref 135–145)

## 2019-09-13 LAB — CBC WITH DIFFERENTIAL/PLATELET
Abs Immature Granulocytes: 0.3 10*3/uL — ABNORMAL HIGH (ref 0.00–0.07)
Basophils Absolute: 0.1 10*3/uL (ref 0.0–0.1)
Basophils Relative: 1 %
Eosinophils Absolute: 0.2 10*3/uL (ref 0.0–0.5)
Eosinophils Relative: 2 %
HCT: 32 % — ABNORMAL LOW (ref 39.0–52.0)
Hemoglobin: 10.3 g/dL — ABNORMAL LOW (ref 13.0–17.0)
Immature Granulocytes: 4 %
Lymphocytes Relative: 20 %
Lymphs Abs: 1.5 10*3/uL (ref 0.7–4.0)
MCH: 31.1 pg (ref 26.0–34.0)
MCHC: 32.2 g/dL (ref 30.0–36.0)
MCV: 96.7 fL (ref 80.0–100.0)
Monocytes Absolute: 1.2 10*3/uL — ABNORMAL HIGH (ref 0.1–1.0)
Monocytes Relative: 15 %
Neutro Abs: 4.5 10*3/uL (ref 1.7–7.7)
Neutrophils Relative %: 58 %
Platelets: 599 10*3/uL — ABNORMAL HIGH (ref 150–400)
RBC: 3.31 MIL/uL — ABNORMAL LOW (ref 4.22–5.81)
RDW: 15.6 % — ABNORMAL HIGH (ref 11.5–15.5)
WBC: 7.7 10*3/uL (ref 4.0–10.5)
nRBC: 0 % (ref 0.0–0.2)

## 2019-09-13 MED ORDER — LIDOCAINE 5 % EX PTCH
1.0000 | MEDICATED_PATCH | CUTANEOUS | Status: DC
  Administered 2019-09-13 – 2019-09-26 (×7): 1 via TRANSDERMAL
  Filled 2019-09-13 (×10): qty 1

## 2019-09-13 MED ORDER — DICLOFENAC SODIUM 1 % EX GEL
2.0000 g | Freq: Three times a day (TID) | CUTANEOUS | Status: DC | PRN
  Administered 2019-09-13 – 2019-09-14 (×2): 2 g via TOPICAL
  Filled 2019-09-13 (×2): qty 100

## 2019-09-13 NOTE — Progress Notes (Signed)
Speech Language Pathology Daily Session Note  Patient Details  Name: [name] MRN: [id number] Date of Birth: [birth year]  Today's Date: 09/13/2019 SLP Individual Time: 0904-1000 SLP Individual Time Calculation (min): 56 min  Short Term Goals: Week 2: SLP Short Term Goal 1 (Week 2): Pt will participate in ongoing assessment of compensatory strategies at bedside to decrease overt s/s of aspiration with thin liquids. SLP Short Term Goal 2 (Week 2): Pt will consume trials of dysphagia 3 with Min A cues for liquid wash and effortful swallow to reduce pharyngeal reduce. SLP Short Term Goal 3 (Week 2): Pt will complete 3 sets of 10 pharyngeal strengthening exercises specifically targeting base of tongue strength with Min A cues. SLP Short Term Goal 4 (Week 2): Pt will complete 3 sets of 15 resp of EMST and 3 sets of 10 reps of IMST with MIn A cues and self-perceived effort of 8 out of 10. SLP Short Term Goal 5 (Week 2): Pt will incresae vocal intensity at the conversation level to convey abstract information in noisy environment to achive ~ 90% intelligibility with supervision level cues.  Skilled Therapeutic Interventions: Skilled ST services focused on swallow and speech skills. SLP facilitated use of EMST device and began use of novel IMST device, pt return demonstration of IMST device set at 35 cm H2O completing 10 sets of 3 reporting self-perceived score of 7/10 and EMST set at 45 cm H2O completing 10 sets of 3 reporting 8/10 effortful level throughout session. Pt reported dizziness following 2nd set of EMST therefore SLP encouraged pt to space out exercises throughout the day if symptoms occur and pt was able to complete last set of 10 at the end of the session with no symptoms of dizziness. Pt demonstrated recall of swallow strategies identified in recent MBS with min A verbal cues including chin tuck/left head turn with thins and effortful swallow with advanced solids/liquid wash. Pt  demonstrated ability to perform effortful swallow with thin carbonated liquids, consuming approximately 14 sips via cup and straw with x1 delayed cough noted when consuming thin via cup. Pt consumed dys 3 trial snack with min A verbal cues to utilize swallow strategies (liquid wash and effortful swallow) with x1 delayed cough noted when completing liquid wash. Pt demonstrated no overt s/s aspirations with liquids with demonstrating chin tuck and left head turn. Pt completed x2 maskao exercises targetting base of tongue strength and encouraged pt to attempt more trials throughout the day, pt agreed.  Pt's speech intelligibility was 80% in conversation with little ability to increase vocal intensity given mod A verbal cues. Pt was left in room with call bell within reach and bed alarm set. ST recommends to continue skilled ST services.      Pain Pain Assessment Pain Score: 0-No pain  Therapy/Group: Individual Therapy  [name]  [facility] 09/13/2019, 12:40 PM

## 2019-09-13 NOTE — Progress Notes (Signed)
CPM started on both legs, 100 degree flex. Slight pain in Right knee premedicated with 5mg  Oxycodone. Pt tolerating well otherwise. No other complaints.Larina Earthly, LPN

## 2019-09-13 NOTE — Progress Notes (Signed)
[place] PHYSICAL MEDICINE & REHABILITATION PROGRESS NOTE   Subjective/Complaints: Continues to complain of right knee pain and left shoulder pain. Otherwise has no complaints. Says Ambien has been helping him to sleep better at night.  Na 131, slightly down from prior results (132-136) Hgb improved.   ROS- neg CP, SOB,  N/V/D; no abd pain   Objective:   No results found. Recent Labs    09/13/19 0656  WBC 7.7  HGB 10.3*  HCT 32.0*  PLT 599*   Recent Labs    09/13/19 0656  NA 131*  K 4.8  CL 95*  CO2 25  GLUCOSE 97  BUN 27*  CREATININE 0.94  CALCIUM 10.1    Intake/Output Summary (Last 24 hours) at 09/13/2019 1104 Last data filed at 09/13/2019 0900 Gross per 24 hour  Intake 600 ml  Output 1000 ml  Net -400 ml     Physical Exam: Vital Signs Blood pressure 116/89, pulse 88, temperature 97.8 F (36.6 C), temperature source Oral, resp. rate 16, weight 91.6 kg, SpO2 100 %. Vitals reviewed and labs reviewed Constitutional: up in manual w/c, wife at side; with PT; cortrak out; looks MUCH better; good color,  NAD;  appears flat/tearful after therapy when saw 2nd time HENT:  Head: Normocephalic and atraumatic.  +NG  Eyes: EOM are normal.  Neck: No tracheal deviation present. No thyromegaly present. Cortrak as above Respiratory: Effort normal. No respiratory distress.  GI: He exhibits no distension.  +Ileostomy -NT, ND Musculoskeletal:     Comments:   + right  knee tenderness over quad tendon with PROM knee flexion + left shoulder tenderness. Incision C/D/I No evidence of knee effusion bilaterally , no erythema around knees or ecchymosis, no jt line tenderness Neurological: He is alert and oriented to person, place, and time.  Motor: Right upper extremity: 4 -/5 proximal distal Left upper extremity: Shoulder abduction, elbow flexion/extension 2+/5, handgrip trace  right lower extremity is in the CPM and cannot be tested. Left lower extremity has 3 - at the hip  flexors knee extensors 4 - and ankle dorsiflexors 3 - Sensation intact light touch Dysarthria Left facial weakness  MSK limited right knee is going to 70 degrees on CPM. Psychiatric: Flat no lability or agitation   Assessment/Plan: 1. Functional deficits secondary to acute brainstem infarct which require 3+ hours per day of interdisciplinary therapy in a comprehensive inpatient rehab setting.  Physiatrist is providing close team supervision and 24 hour management of active medical problems listed below.  Physiatrist and rehab team continue to assess barriers to discharge/monitor patient progress toward functional and medical goals  Care Tool:  Bathing              Bathing assist       Upper Body Dressing/Undressing Upper body dressing   What is the patient wearing?: Pull over shirt    Upper body assist Assist Level: Minimal Assistance - Patient > 75%    Lower Body Dressing/Undressing Lower body dressing            Lower body assist       Toileting Toileting    Toileting assist Assist for toileting: Minimal Assistance - Patient > 75% Assistive Device Comment: urinal   Transfers Chair/bed transfer  Transfers assist           Locomotion Ambulation   Ambulation assist              Walk 10 feet activity   Assist  Walk 50 feet activity   Assist           Walk 150 feet activity   Assist           Walk 10 feet on uneven surface  activity   Assist           Wheelchair     Assist               Wheelchair 50 feet with 2 turns activity    Assist            Wheelchair 150 feet activity     Assist          Blood pressure 116/89, pulse 88, temperature 97.8 F (36.6 C), temperature source Oral, resp. rate 16, weight 91.6 kg, SpO2 100 %.  Medical Problem List and Plan: 1.  Deficits with mobility, endurance, self-care secondary to acute right paramedian pontine infarct and  polytrauma.             -ELOS 12/15            CIR Level PT , OT , SLP  2.  Antithrombotics: -DVT/anticoagulation:  Pharmaceutical: Heparin doppler neg for DVT              -antiplatelet therapy: ASA 3. Pain Management: tylenol for mild and oxycodone for severe pain.              Monitor with increased mobility Doesn't like how pain meds make him feel   12/7: For right knee pain, will add diclofenac gel. For left shoulder pain, will add Lidocaine patch 4. Mood: LCSW to follow for support and evaluation. Team to provide ego support             -antipsychotic agents: N/A.  5. Neuropsych: This patient is capable of making decisions on his own behalf. 6. Skin/Wound Care: routine pressure relief measures.  7. Fluids/Electrolytes/Nutrition: Monitor I/O. Encourage intake--he's afraid to eat due to concerns of return of abdominal pain. Change TF to nights to promote hunger.      12/7 Na 131, slightly decreased from prior (132-136), will trend tomorrow 8. Right pontine stroke: On ASA for secondary stroke prevention.  9. Mesenteric injuries s/p Ileocecectomy, total colectomy and repair of lacerations: Foley to collect high volume out of ileostomy."liquidy stools expected given lack of lg colon  Lomotil prn 10. Fistula/leak/abscess: completed  Zosyn 11/15-->08/2910. Episode of SVT: Resolved with BB             Monitor with increased mobility. 12. Situational depression with insomnia: Continue Seroquel at nights.   13. Right knee pain: Question MRI for workup.  14. ORIF left clavicle and left distal radius fracture: NWB left wrist--may WB thorough elbow with platform walker. 15.  IM nailing right femur and ORIF right bimalleolar Fx: .  16. Anemia:likely ABLA  11/30 CBC stable, improved to 10.6 on 12/7 17.  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 He has CPM ordered from 0 to 90, was ranged under GA to 125  deg, patient increased rapidly on the right side and has soreness related to this.  On the left side he went up to 115 degrees.  He has goals of 125 degrees.  Recommend that he increase his by no more than 10 degrees/day on the CPM degrees.  Continue aggressive rehab efforts 18.  Left MCP, PIP and DIP contracture- extention, will ask OT to  perform passive flexion of fingers   19.  Papular rash upper chest and thighs, non pruitic, does not look infectious, likely either med allergy or contact dermatitis, now off piperacillin , NKDA Monitor and treat sympromatically , ask pharmacy to review med list  Order benadryl cream   20.  Dysphagia post stroke, d/w SLP should have normal sensation due to pontine location, may have anxiety component LOS: 3 days A FACE TO FACE EVALUATION WAS PERFORMED  Harlene Petralia P Dody Smartt 09/13/2019, 11:04 AM

## 2019-09-13 NOTE — Progress Notes (Signed)
Occupational Therapy Session Note  Patient Details  Name: [name] MRN: [id number] Date of Birth: 07-Jun-1961  Today's Date: 09/13/2019 OT Individual Time: [id number] OT Individual Time Calculation (min): 40 min    Short Term Goals: Week 2:  OT Short Term Goal 1 (Week 2): STGs=LTGs due to ELOS  Skilled Therapeutic Interventions/Progress Updates:    Treatment session with focus on LUE self-ROM and AAROM to shoulder and wrist.  Pt received supine in bed reporting pain in Rt knee and declining any OOB or even EOB activity this session.  Engaged in [place] with focus on wrist extension and shoulder flexion and abduction.  Encouraged pt to complete self-ROM with wrist flexion/extension and abduction/adduction with focus on increased ROM and tolerance to ROM.  Provided pt with self-ROM exercises for wrist and shoulder.  Pt able to return demonstration with min cues for technique.    Therapy Documentation Precautions:  Precautions Precaution Comments: colostomy, L UE WBAT (no longer needs splint), R LE WBAT (no longer needs CAM boot) as of 12/4 Restrictions Weight Bearing Restrictions: Yes LUE Weight Bearing: Weight bear through elbow only RLE Weight Bearing: Weight bearing as tolerated General:   Vital Signs: Therapy Vitals Temp: 98 F (36.7 C) Temp Source: Oral Pulse Rate: 86 Resp: 14 BP: 113/81 Patient Position (if appropriate): Lying Oxygen Therapy SpO2: 99 % O2 Device: Room Air Pain: Pt with c/o pain in Rt knee 10/10, premedicated.    Therapy/Group: Individual Therapy  Simonne Come 09/13/2019, 3:21 PM

## 2019-09-13 NOTE — Progress Notes (Signed)
Occupational Therapy Session Note  Patient Details  Name: [name] MRN: [id number] Date of Birth: March 25, 1961  Today's Date: 09/13/2019 OT Individual Time: 3361-2244 OT Individual Time Calculation (min): 60 min   Skilled Therapeutic Interventions/Progress Updates:    Pt greeted in bed with RN present for ostomy care. Afterwards pt wanted to eat breakfast. Supine<sit completed with supervision assist and mattress set at max firmness. While he ate, mod facilitation at Lt elbow to use L UE for stabilizing his plate. HOH for using Lt as gross stabilizer when opening water bottle. Continued d/c planning with pt reporting that the main level of his home is w/c accessible. There are 7 steps to alternate levels of his home. Family has not yet built a ramp. Pt reported that there is no bathroom on 1st level so we discussed using the kitchen sink for sponge bathing. He verbalized that he would rather use the urinal vs BSC if he cannot progress to use the toilet (upstairs) by time of d/c. After his meal, pt requested to complete UB bathing/dressing tasks at the sink. Max A for stand pivot<w/c using RW with pt able to minimally advance each LE. Min A for UB self care and oral care tasks to incorporate L UE functionally. He reported increased pain when using Lt to reach his Rt underarm so OT met these task demands instead. At end of session pt requested to return to bed, Max A for stand pivot<bed with OT facilitating hip rotation because he was not able to advance LEs at all this time. Min A for transition to supine where pt was repositioned for comfort. Provided ice for Rt knee to manage pain. He was left in care of RN to receive pain medicine.    Therapy Documentation Precautions:  Precautions Precaution Comments: colostomy, L UE WBAT (no longer needs splint), R LE WBAT (no longer needs CAM boot) as of 12/4 Restrictions Weight Bearing Restrictions: Yes LUE Weight Bearing: Weight bear through elbow  only RLE Weight Bearing: Weight bearing as tolerated Pain: Pain Assessment Pain Scale: 0-10 Pain Score: 6  Pain Type: Acute pain Pain Location: Knee Pain Orientation: Right Pain Descriptors / Indicators: Aching Pain Frequency: Intermittent Pain Onset: On-going Patients Stated Pain Goal: 0 Pain Intervention(s): Medication (See eMAR) ADL:     Therapy/Group: Individual Therapy  [name] 09/13/2019, 12:22 PM

## 2019-09-13 NOTE — Plan of Care (Signed)
OT POC updated with dynamic standing and toilet transfer goals d/c   Problem: RH Balance Goal: LTG Patient will maintain dynamic standing with ADLs (OT) Description: LTG:  Patient will maintain dynamic standing balance with assist during activities of daily living (OT)  Outcome: Adequate for Discharge   Problem: RH Toilet Transfers Goal: LTG Patient will perform toilet transfers w/assist (OT) Description: LTG: Patient will perform toilet transfers with assist, with/without cues using equipment (OT) Outcome: Adequate for Discharge

## 2019-09-13 NOTE — Progress Notes (Signed)
Occupational Therapy Weekly Progress Note  Patient Details  Name: [name] MRN: [id number] Date of Birth: [birth year]  Beginning of progress report period: 09/03/2019 End of progress report period: 09/13/2019  Patient has met 2 of 3 short term goals.    Pt has made limited progress at time of report. He returned to the OR for manipulations of B knee contractions on 12/4. Upon return to CIR pt with new MD orders of WBAT L UE and WBAT R LE without CAM boot. Pt is still having a tough time with using L UE functionally and completing stand pivot transfers due to pain. He requires Max A for stand pivot transfers using RW and Max A for LB self care. Continue OT POC. Focus this week will be placed on d/c planning for home on 12/15.   Patient continues to demonstrate the following deficits: muscle weakness and acute pain, decreased cardiorespiratoy endurance and decreased standing balance and decreased balance strategies and therefore will continue to benefit from skilled OT intervention to enhance overall performance with BADL.  Patient progressing toward long term goals..  Continue plan of care.  OT Short Term Goals Week 1:  OT Short Term Goal 1 - Progress (Week 1): Met OT Short Term Goal 2 - Progress (Week 1): Progressing toward goal OT Short Term Goal 3 - Progress (Week 1): Met Week 2:  OT Short Term Goal 1 (Week 2): STGs=LTGs due to ELOS     Therapy Documentation Precautions:  Precautions Precaution Comments: colostomy, L UE WBAT (no longer needs splint), R LE WBAT (no longer needs CAM boot) as of 12/4 Restrictions Weight Bearing Restrictions: Yes LUE Weight Bearing: Weight bear through elbow only RLE Weight Bearing: Weight bearing as tolerated(left elbow WBAT , left wrist NO weight bear) Vital Signs: Therapy Vitals Temp: 97.8 F (36.6 C) Temp Source: Oral Pulse Rate: 88 Resp: 16 BP: 116/89 Patient Position (if appropriate): Sitting Oxygen Therapy SpO2: 100 % O2 Device:  Room Air Pain: Pain Assessment Pain Score: Asleep ADL:       Therapy/Group: Individual Therapy  Daniele Dillow A Annaleese Guier 09/13/2019, 7:32 AM

## 2019-09-13 NOTE — Consult Note (Signed)
[facility] Nurse ostomy follow up Stoma type/location: RUQ ileostomy. Pouch leaked early this AM and 1 piece pouch was placed.  No barrier ring.  We discussed the rationale for the ring (I answered his questions). He is attentive and involved but limited in fine motor skills a this time.  We will have a full training/pouch change this Wednesday at 10 AM with wife present.  Stomal assessment/size: not assessed Peristomal assessment: not assessed Treatment options for stomal/peristomal skin: 1 piece convex Output yellow soft stool Ostomy pouching: 1pc. Education provided: see above Enrolled patient in [employer] Discharge program: Yes [place] team will follow.  Domenic Moras MSN, RN, FNP-BC CWON Wound, Ostomy, Continence Nurse Pager [phone]

## 2019-09-14 ENCOUNTER — Inpatient Hospital Stay (HOSPITAL_COMMUNITY): Payer: 59 | Admitting: Occupational Therapy

## 2019-09-14 ENCOUNTER — Inpatient Hospital Stay (HOSPITAL_COMMUNITY): Payer: 59

## 2019-09-14 ENCOUNTER — Inpatient Hospital Stay (HOSPITAL_COMMUNITY): Payer: 59 | Admitting: Physical Therapy

## 2019-09-14 LAB — BASIC METABOLIC PANEL
Anion gap: 14 (ref 5–15)
BUN: 26 mg/dL — ABNORMAL HIGH (ref 6–20)
CO2: 22 mmol/L (ref 22–32)
Calcium: 9.9 mg/dL (ref 8.9–10.3)
Chloride: 93 mmol/L — ABNORMAL LOW (ref 98–111)
Creatinine, Ser: 0.86 mg/dL (ref 0.61–1.24)
GFR calc Af Amer: >60 mL/min (ref >60)
GFR calc non Af Amer: >60 mL/min (ref >60)
Glucose, Bld: 116 mg/dL — ABNORMAL HIGH (ref 70–99)
Potassium: 4.1 mmol/L (ref 3.5–5.1)
Sodium: 129 mmol/L — ABNORMAL LOW (ref 135–145)

## 2019-09-14 NOTE — Progress Notes (Signed)
PHYSICAL MEDICINE & REHABILITATION PROGRESS NOTE   Subjective/Complaints:   C/o Right knee pain and swelling , down to 70 deg for CPM   ROS- neg CP, SOB,  N/V/D; no abd pain   Objective:   No results found. Recent Labs    09/13/19 0656  WBC 7.7  HGB 10.3*  HCT 32.0*  PLT 599*   Recent Labs    09/13/19 0656 09/14/19 0541  NA 131* 129*  K 4.8 4.1  CL 95* 93*  CO2 25 22  GLUCOSE 97 116*  BUN 27* 26*  CREATININE 0.94 0.86  CALCIUM 10.1 9.9    Intake/Output Summary (Last 24 hours) at 09/14/2019 0825 Last data filed at 09/14/2019 0758 Gross per 24 hour  Intake 1098 ml  Output 1925 ml  Net -827 ml     Physical Exam: Vital Signs Blood pressure 115/87, pulse 88, temperature 97.6 F (36.4 C), resp. rate 16, weight 93.4 kg, SpO2 98 %. Vitals reviewed and labs reviewed Constitutional: up in manual w/c, wife at side; with PT; cortrak out; looks MUCH better; good color,  NAD;  appears flat/tearful after therapy when saw 2nd time HENT:  Head: Normocephalic and atraumatic.  +NG  Eyes: EOM are normal.  Neck: No tracheal deviation present. No thyromegaly present. Cortrak as above Respiratory: Effort normal. No respiratory distress.  GI: He exhibits no distension.  +Ileostomy -NT, ND Musculoskeletal:     Comments:   + right  knee tenderness over quad tendon with PROM knee flexion + left shoulder tenderness. Incision C/D/I No evidence of knee effusion bilaterally , no erythema around knees or ecchymosis, no jt line tenderness Neurological: He is alert and oriented to person, place, and time.  Motor: Right upper extremity: 4 -/5 proximal distal Left upper extremity: Shoulder abduction, elbow flexion/extension 3- /5, handgrip trace  right lower extremity is in the CPM and cannot be tested. Left lower extremity has 3 - at the hip flexors knee extensors 4 - and ankle dorsiflexors 3 - Sensation intact light touch Dysarthria Left facial weakness  MSK limited  right knee ROM, + joint effusion , no erythema  Psychiatric: Flat no lability or agitation   Assessment/Plan: 1. Functional deficits secondary to acute brainstem infarct which require 3+ hours per day of interdisciplinary therapy in a comprehensive inpatient rehab setting.  Physiatrist is providing close team supervision and 24 hour management of active medical problems listed below.  Physiatrist and rehab team continue to assess barriers to discharge/monitor patient progress toward functional and medical goals  Care Tool:  Bathing              Bathing assist       Upper Body Dressing/Undressing Upper body dressing   What is the patient wearing?: Pull over shirt    Upper body assist Assist Level: Minimal Assistance - Patient > 75%    Lower Body Dressing/Undressing Lower body dressing            Lower body assist       Toileting Toileting    Toileting assist Assist for toileting: Minimal Assistance - Patient > 75% Assistive Device Comment: urinal   Transfers Chair/bed transfer  Transfers assist           Locomotion Ambulation   Ambulation assist              Walk 10 feet activity   Assist           Walk 50 feet activity  Assist           Walk 150 feet activity   Assist           Walk 10 feet on uneven surface  activity   Assist           Wheelchair     Assist               Wheelchair 50 feet with 2 turns activity    Assist            Wheelchair 150 feet activity     Assist          Blood pressure 115/87, pulse 88, temperature 97.6 F (36.4 C), resp. rate 16, weight 93.4 kg, SpO2 98 %.  Medical Problem List and Plan: 1.  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             -ELOS 12/15            CIR Level PT , OT , SLP - LUE strength improving although pt does not appreciate this  2.   Antithrombotics: -DVT/anticoagulation:  Pharmaceutical: Heparin doppler neg for DVT              -antiplatelet therapy: ASA 3. Pain Management: tylenol for mild and oxycodone for severe pain.              Monitor with increased mobility Doesn't like how pain meds make him feel   12/8: For right knee pain, will add diclofenac gel. For left shoulder pain, will add Lidocaine patch, will ask Ortho to eval effusion, may need arthrocentesis will contact4. Mood: LCSW to follow for support and evaluation. Team to provide ego support             -antipsychotic agents: N/A.  5. Neuropsych: This patient is capable of making decisions on his own behalf. 6. Skin/Wound Care: routine pressure relief measures., abd wound healing   7. Fluids/Electrolytes/Nutrition: Monitor I/O. Encourage intake--he's afraid to eat due to concerns of return of abdominal pain. Change TF to nights to promote hunger.      12/7 Na 131, slightly decreased from prior (132-136), will trend tomorrow 8. Right pontine stroke: On ASA for secondary stroke prevention.  9. Mesenteric injuries s/p Ileocecectomy, total colectomy and repair of lacerations: Foley to collect high volume out of ileostomy."liquidy stools expected given lack of lg colon  Lomotil prn 10. Fistula/leak/abscess: completed  Zosyn 11/15-->11/29. 12. Situational depression with insomnia: Continue Seroquel at nights.   13. Right knee pain: Question MRI for workup.  14. ORIF left clavicle and left distal radius fracture: NWB left wrist--may WB thorough elbow with platform walker. 15.  IM nailing right femur and ORIF right bimalleolar Fx: .  16. Anemia:likely ABLA  11/30 CBC stable, improved to 10.6 on 12/7 17.  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 He has CPM ordered from 0 to 90, was ranged under GA to 125 deg, patient increased rapidly on the right side and has soreness  related to this.  On the left side he went up to 115 degrees.  He has goals of 125 degrees.  Recommend that he increase his by no more than 10 degrees/day on the CPM degrees.  Continue aggressive rehab efforts 18.  Left MCP, PIP and DIP contracture- extention, will ask OT to perform passive flexion of fingers  19.  Papular rash upper chest and thighs, non pruitic, does not look infectious, likely either med allergy or contact dermatitis, now off piperacillin , NKDA Monitor and treat sympromatically , ask pharmacy to review med list  Order benadryl cream   20.  Dysphagia post stroke, d/w SLP should have normal sensation due to pontine location, may have anxiety component LOS: 4 days A FACE TO FACE EVALUATION WAS PERFORMED  Charlett Blake 09/14/2019, 8:25 AM

## 2019-09-14 NOTE — Progress Notes (Signed)
Pt assisted nurse in emptying of colostomy bag. Nurse undid velcro and pt performed the rest. Larina Earthly, LPN

## 2019-09-14 NOTE — Progress Notes (Signed)
Social Work Patient ID: [name], male   DOB: [birth year], 58 y.o.   MRN: [id number]    Diagnosis code:I63.30, T07.Merril Abbe, S72.91XA and S42.002A  Height: 5'10  Weight: 211 lbs   Patient has R-CVA, R-femur Fx and L-clavicle Fx which requires his trunk and legs to be positioned in ways not feasible with a normal bed.  Head must be elevated at least 30 degrees. His upper extremities and lower extremities requires frequent and immediate changes in body position which cannot be achieved with a normal bed.

## 2019-09-14 NOTE — Progress Notes (Signed)
Occupational Therapy Session Note  Patient Details  Name: [name] MRN: [id number] Date of Birth: September 01, 1961  Today's Date: 09/14/2019 OT Individual Time: 1500-1555 OT Individual Time Calculation (min): 55 min    Short Term Goals: Week 1:  OT Short Term Goal 1 - Progress (Week 1): Met OT Short Term Goal 2 - Progress (Week 1): Progressing toward goal OT Short Term Goal 3 - Progress (Week 1): Met Week 2:  OT Short Term Goal 1 (Week 2): STGs=LTGs due to ELOS  Skilled Therapeutic Interventions/Progress Updates:    Upon entering the room, pt supine in bed with c/o 8/10 pain in R knee. OT notified RN. R knee feels hot to touch this session. Pt requesting to change brief as he feels like he has had BM. Pt able to bridge and push shorts down. OT providing set up for pt to wash peri area but needing total A to wash buttocks as pt rolls to the R and unable to utilize L hand to hold cloth. Clean brief donned with total A and mod A to pull shorts over L hip. OT providing PROM to L digits with pt verbalizing increased pain with task. Pt with very limited ROM - DIP 20 degrees, PIP 25 degrees, and MCP 20 degrees. Pt's thumb with virtually no movement with PROM and pt pulling hand away secondary to pain with PROM. Pt performs supine >sit with min A for R LE. OT placing R LE onto elevated surface and pt gradually increasing height of bed to increase flexion. Pt sitting on EOB crying secondary to pain and returned to bed level with min A. OT placing ice on R knee and RN gave pain medication this session. All needs within reach upon exiting the room.   Therapy Documentation Precautions:  Precautions Precaution Comments: colostomy, L UE WBAT (no longer needs splint), R LE WBAT (no longer needs CAM boot) as of 12/4 Restrictions Weight Bearing Restrictions: Yes LUE Weight Bearing: Weight bear through elbow only RLE Weight Bearing: Weight bearing as tolerated General:   Vital Signs: Therapy Vitals Temp:  98.9 F (37.2 C) Pulse Rate: 89 Resp: 16 BP: 113/77 Patient Position (if appropriate): Lying Oxygen Therapy SpO2: 100 % O2 Device: Room Air Pain: Pain Assessment Pain Scale: 0-10 Pain Score: 8  Pain Type: Acute pain Pain Location: Knee Pain Orientation: Right Pain Descriptors / Indicators: Aching;Discomfort;Throbbing Pain Frequency: Constant Pain Onset: On-going Patients Stated Pain Goal: 1 Pain Intervention(s): Medication (See eMAR)   Therapy/Group: Individual Therapy  Gypsy Decant 09/14/2019, 4:29 PM

## 2019-09-14 NOTE — Progress Notes (Signed)
Physical Therapy Session Note  Patient Details  Name: [name] MRN: [id number] Date of Birth: 09-27-1961  Today's Date: 09/14/2019 PT Individual Time: 1100-1200 PT Individual Time Calculation (min): 60 min   Short Term Goals: Week 2:  PT Short Term Goal 1 (Week 2): Pt will ambulate 18f with mod assist and LRAD PT Short Term Goal 2 (Week 2): Pt will performed sit<>stand with min assist PT Short Term Goal 3 (Week 2): Pt will propell WC >1513fwith supervision assist  Skilled Therapeutic Interventions/Progress Updates: Pt presented in bed agreeable to therapy. Pt stating significant pain in R knee and feels that he can "hardly put any weight on it". Ortho PA arrived to assess knee indicated some swelling but not in knee capsule ?soft tissue. Discussed with pt importance of performing AAROM/AROM for successful manipulation. Pt anxious bt verbalized understanding. PTA showed pt pics of before/after manipulation as pt then had better understanding of why knees have been in pain. Discussed difference between CPM and AAROM/AROM. Pt then participated in supine BLE therex as follows. QS, AA heel slides, hip abd/add, SAQ, AA SLR 2 x 10 bilaterally. Pt surprised at how much he was able to tolerate although he did indicate there was an increase in pain but was able to manage. PTA provided multiple therapeutic rest breaks between activities. PTA then encouraged pt to sit at EOB to work on range. Pt performed supine to sit with use of bed features with minA for PTA to manage RLE for pain management. Pt sat at EOB initially with RLE supported with BSC basin then PTA removed and pt was able to let hang dependently. Pt then raised bed and pt was able to increase range. AROM 70 degrees LLE, 58 degrees RLE. Pt currently able to tolerate 100 degrees LLE and 70 degrees RLE on CPM. Pt returned to bed at end of session minA for RLE management and was able to position self to comfort with use of bed features. Pt left  with alarm on, call bell within reach and needs met.      Therapy Documentation Precautions:  Precautions Precaution Comments: colostomy, L UE WBAT (no longer needs splint), R LE WBAT (no longer needs CAM boot) as of 12/4 Restrictions Weight Bearing Restrictions: Yes LUE Weight Bearing: Weight bear through elbow only RLE Weight Bearing: Weight bearing as tolerated General:   Vital Signs: Therapy Vitals Temp: 98.9 F (37.2 C) Pulse Rate: 89 Resp: 16 BP: 113/77 Patient Position (if appropriate): Lying Oxygen Therapy SpO2: 100 % O2 Device: Room Air Pain: Pain Assessment Pain Scale: 0-10 Pain Score: 8  Pain Type: Acute pain Pain Location: Knee Pain Orientation: Right Pain Descriptors / Indicators: Aching;Discomfort;Throbbing Pain Frequency: Constant Pain Onset: On-going Patients Stated Pain Goal: 1 Pain Intervention(s): Medication (See eMAR)  Therapy/Group: Individual Therapy  Sullivan Blasing  Darlisha Kelm, PTA  09/14/2019, 4:14 PM

## 2019-09-14 NOTE — Progress Notes (Signed)
Social Work Patient ID: [name], male   DOB: Dec 02, 1960, 58 y.o.   MRN: [id number]  FMLA paperwork given to pt and wife to pick up later today.

## 2019-09-14 NOTE — Progress Notes (Signed)
Social Work Patient ID: [name], male   DOB: [birth year], 58 y.o.   MRN: [id number]    Diagnosis codes:T07.XXXA, I63.30, S72.91XA and S42.002A  Height:  5'10              Weight:  211 lbs          Patient suffers from R-CVA and multiple trauma   which impairs his ability to perform daily activities like ADLs' and tolieting   in the home.  A walker will not resolve issue with performing activities of daily living.  A wheelchair will allow patient to safely perform daily activities.  Patient is not able to propel themselves in the home using a standard weight wheelchair due to fatigue and endurance .  Patient can self propel in the lightweight wheelchair.

## 2019-09-14 NOTE — Progress Notes (Signed)
Speech Language Pathology Daily Session Note  Patient Details  Name: DAMARR BLASKE MRN: [id number] Date of Birth: 1961/08/10  Today's Date: 09/14/2019 SLP Individual Time: 0905-1000 SLP Individual Time Calculation (min): 55 min  Short Term Goals: Week 2: SLP Short Term Goal 1 (Week 2): Pt will participate in ongoing assessment of compensatory strategies at bedside to decrease overt s/s of aspiration with thin liquids. SLP Short Term Goal 2 (Week 2): Pt will consume trials of dysphagia 3 with Min A cues for liquid wash and effortful swallow to reduce pharyngeal reduce. SLP Short Term Goal 3 (Week 2): Pt will complete 3 sets of 10 pharyngeal strengthening exercises specifically targeting base of tongue strength with Min A cues. SLP Short Term Goal 4 (Week 2): Pt will complete 3 sets of 15 resp of EMST and 3 sets of 10 reps of IMST with MIn A cues and self-perceived effort of 8 out of 10. SLP Short Term Goal 5 (Week 2): Pt will incresae vocal intensity at the conversation level to convey abstract information in noisy environment to achive ~ 90% intelligibility with supervision level cues.  Skilled Therapeutic Interventions: Skilled ST services focused on swallow and speech skills. SLP facilitated completion of RMT exercises throughout session, 10 sets of 3 with EMST set at 45 cm H2O and IMST set at 35cm H2O and pt reported self-preceived effort of 7/10. Pt completed x5 masako exercises to strength base of tongue, with tongue held more towards tip verse medial portion between teeth. Pt demonstrated independent use of chin tuck and left head turn during PO consumption of carbonated thin via straw (approximately 6oz) with no overt s/s aspiration. Pt consumed dys 3 snack trial with supervision A verbal cues for effortful swallow with no overt s/s aspiration or sensation of pharyngeal residue. Pt demonstrated 90% intelligibility initially in communication barrier task and in conversation fading to 80%  intelligibility with supervision A verbal cues to increase vocal intensity in a quiet environment with listener sitting within close proximity. Pt was left in room with call bell within reach and bed alarm set. ST recommends to continue skilled ST services.      Pain Pain Assessment Pain Scale: 0-10 Pain Score: Asleep Pain Intervention(s): Medication (See eMAR)  Therapy/Group: Individual Therapy  Marge Vandermeulen  [facility] 09/14/2019, 7:58 AM

## 2019-09-15 ENCOUNTER — Inpatient Hospital Stay (HOSPITAL_COMMUNITY): Payer: 59 | Admitting: Physical Therapy

## 2019-09-15 ENCOUNTER — Inpatient Hospital Stay (HOSPITAL_COMMUNITY): Payer: 59

## 2019-09-15 ENCOUNTER — Encounter (HOSPITAL_COMMUNITY): Payer: 59 | Admitting: Psychology

## 2019-09-15 DIAGNOSIS — F418 Other specified anxiety disorders: Secondary | ICD-10-CM

## 2019-09-15 MED ORDER — OXYCODONE HCL 5 MG PO TABS
10.0000 mg | ORAL_TABLET | Freq: Four times a day (QID) | ORAL | Status: DC | PRN
  Filled 2019-09-15 (×4): qty 2

## 2019-09-15 MED ORDER — CALAMINE EX LOTN
TOPICAL_LOTION | Freq: Four times a day (QID) | CUTANEOUS | Status: DC
  Administered 2019-09-15 – 2019-09-17 (×8): via TOPICAL
  Administered 2019-09-17 (×2): 1 via TOPICAL
  Administered 2019-09-18 – 2019-09-20 (×6): via TOPICAL
  Administered 2019-09-24 – 2019-09-26 (×2): 1 via TOPICAL
  Filled 2019-09-15: qty 177

## 2019-09-15 MED ORDER — CELECOXIB 200 MG PO CAPS
200.0000 mg | ORAL_CAPSULE | Freq: Two times a day (BID) | ORAL | Status: DC
  Administered 2019-09-15 – 2019-09-28 (×27): 200 mg via ORAL
  Filled 2019-09-15 (×27): qty 1

## 2019-09-15 MED ORDER — NAPROXEN 250 MG PO TABS
500.0000 mg | ORAL_TABLET | Freq: Two times a day (BID) | ORAL | Status: DC
  Filled 2019-09-15: qty 2
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
## 2019-09-15 NOTE — Progress Notes (Addendum)
[facility] PHYSICAL MEDICINE & REHABILITATION PROGRESS NOTE   Subjective/Complaints:  Discussed colostomy supplies with wife, HHRN will be ordered  Discussed hospital bed with PT, , will need one due to poor  Strength  on left  C/o Right knee pain and swelling , down to 70 deg for CPM  Also concerned a bout rash on chest and thighs mild pruritis   ROS- neg CP, SOB,  N/V/D; no abd pain   Objective:   No results found. Recent Labs    09/13/19 0656  WBC 7.7  HGB 10.3*  HCT 32.0*  PLT 599*   Recent Labs    09/13/19 0656 09/14/19 0541  NA 131* 129*  K 4.8 4.1  CL 95* 93*  CO2 25 22  GLUCOSE 97 116*  BUN 27* 26*  CREATININE 0.94 0.86  CALCIUM 10.1 9.9    Intake/Output Summary (Last 24 hours) at 09/15/2019 0846 Last data filed at 09/15/2019 0752 Gross per 24 hour  Intake 650 ml  Output 1935 ml  Net -1285 ml     Physical Exam: Vital Signs Blood pressure 119/85, pulse 95, temperature 98 F (36.7 C), resp. rate 16, weight 93 kg, SpO2 99 %. Vitals reviewed and labs reviewed Constitutional: up in manual w/c, wife at side; with PT; cortrak out; looks MUCH better; good color,  NAD;  appears flat/tearful after therapy when saw 2nd time HENT:  Head: Normocephalic and atraumatic.    Eyes: EOM are normal.  Neck: No tracheal deviation present. No thyromegaly present. Cortrak as above Respiratory: Effort normal. No respiratory distress.  GI: He exhibits no distension.  +Ileostomy -NT, ND Musculoskeletal:     Comments:   + right  knee tenderness, mod effsuion  + left shoulder tenderness. Incision C/D/I No evidence of knee effusion bilaterally , no erythema around knees or ecchymosis, no jt line tenderness Neurological: He is alert and oriented to person, place, and time.  Motor: Right upper extremity: 4 -/5 proximal distal Left upper extremity: Shoulder abduction, elbow flexion/extension 3- /5, handgrip trace  right lower extremity is in the CPM and cannot be  tested. Left lower extremity has 3 - at the hip flexors knee extensors 4 - and ankle dorsiflexors 3 - Sensation intact light touch Dysarthria Left facial weakness improving  MSK limited right knee ROM, + joint effusion , no erythema  Psychiatric: Flat no lability or agitation   Assessment/Plan: 1. Functional deficits secondary to acute brainstem infarct which require 3+ hours per day of interdisciplinary therapy in a comprehensive inpatient rehab setting.  Physiatrist is providing close team supervision and 24 hour management of active medical problems listed below.  Physiatrist and rehab team continue to assess barriers to discharge/monitor patient progress toward functional and medical goals  Care Tool:  Bathing              Bathing assist       Upper Body Dressing/Undressing Upper body dressing   What is the patient wearing?: Pull over shirt    Upper body assist Assist Level: Minimal Assistance - Patient > 75%    Lower Body Dressing/Undressing Lower body dressing            Lower body assist       Toileting Toileting    Toileting assist Assist for toileting: Minimal Assistance - Patient > 75% Assistive Device Comment: urinal   Transfers Chair/bed transfer  Transfers assist           Locomotion Ambulation   Ambulation assist  Walk 10 feet activity   Assist           Walk 50 feet activity   Assist           Walk 150 feet activity   Assist           Walk 10 feet on uneven surface  activity   Assist           Wheelchair     Assist               Wheelchair 50 feet with 2 turns activity    Assist            Wheelchair 150 feet activity     Assist          Blood pressure 119/85, pulse 95, temperature 98 F (36.7 C), resp. rate 16, weight 93 kg, SpO2 99 %.  Medical Problem List and Plan: 1.  Deficits with mobility, endurance, self-care secondary to acute right  paramedian pontine infarct and polytrauma.             -ELOS 12/15            CIR Level PT , OT , SLP - Team conference today please see physician documentation under team conference tab, met with team face-to-face to discuss problems,progress, and goals. Formulized individual treatment plan based on medical history, underlying problem and comorbidities. Per therapy exercise tolerance has worsened after manipulation may need to extend the stay  2.  Antithrombotics: -DVT/anticoagulation:  Pharmaceutical: Heparin doppler neg for DVT              -antiplatelet therapy: ASA 3. Pain Management: tylenol for mild and oxycodone for severe pain.              Monitor with increased mobility Doesn't like how pain meds make him feel   12/8: For right knee pain, will add diclofenac gel. For left shoulder pain, will add Lidocaine patch, will ask Ortho to eval effusion, may need arthrocentesis will contact4. Mood: LCSW to follow for support and evaluation. Team to provide ego support             -antipsychotic agents: N/A.  5. Neuropsych: This patient is capable of making decisions on his own behalf. 6. Skin/Wound Care: routine pressure relief measures., abd wound healing  Miliarsia- likely from heat related to overlay, change benadryl to calamine 7. Fluids/Electrolytes/Nutrition: Monitor I/O. Encourage intake--he's afraid to eat due to concerns of return of abdominal pain. Change TF to nights to promote hunger.      12/7 Na 131, slightly decreased from prior (132-136), will trend tomorrow 8. Right pontine stroke: On ASA for secondary stroke prevention.  9. Mesenteric injuries s/p Ileocecectomy, total colectomy and repair of lacerations: Foley to collect high volume out of ileostomy."liquidy stools expected given lack of lg colon  Lomotil prn 10. Fistula/leak/abscess: completed  Zosyn 11/15-->11/29. 12. Situational depression with insomnia: Continue Seroquel at nights.   13. Right knee pain: Question MRI for  workup.  14. ORIF left clavicle and left distal radius fracture: NWB left wrist--may WB thorough elbow with platform walker. 15.  IM nailing right femur and ORIF right bimalleolar Fx: .  16. Anemia:likely ABLA  11/30 CBC stable, improved to 10.6 on 12/7 17.  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 He has CPM ordered from 0 to 90, was ranged under GA to 125 deg, patient increased  rapidly on the right side and has soreness related to this.  On the left side he went up to 115 degrees.  He has goals of 125 degrees.  Recommend that he increase his by no more than 10 degrees/day on the CPM degrees.  Continue aggressive rehab efforts 18.  Left MCP, PIP and DIP contracture- extention, will ask OT to perform passive flexion of fingers   19.  Papular rash upper chest and thighs, miliaria- switch benadryl to caladryl 20.  Dysphagia post stroke, d/w SLP should have normal sensation due to pontine location, may have anxiety component LOS: 5 days A FACE TO FACE EVALUATION WAS PERFORMED  Charlett Blake 09/15/2019, 8:46 AM

## 2019-09-15 NOTE — Progress Notes (Signed)
Social Work Patient ID: [name], male   DOB: Nov 18, 1960, 58 y.o.   MRN: [id number]  Met with pt and wife to discuss team conference progress this week and missed sessions due to knee manipulations and limitations from this last Friday. Made aware extended one week to reach his goals originally set for him. Both in agreement with this plan. Both are learning his ileostomy care when able, WOC-RN to see today both of them. Wife received FMLA forms and will turn in. Will work toward discharge needs and encouraged pt to continue to move his knees and push himself. He is trying to do this.
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
## 2019-09-15 NOTE — Progress Notes (Signed)
Occupational Therapy Session Note  Patient Details  Name: [name] MRN: [id number] Date of Birth: November 15, 1960  Today's Date: 09/15/2019 OT Individual Time: 1300-1400 OT Individual Time Calculation (min): 60 min    Short Term Goals: Week 2:  OT Short Term Goal 1 (Week 2): STGs=LTGs due to ELOS  Skilled Therapeutic Interventions/Progress Updates:    1:1. Pt received in bed agreeable to bathing and dressing. Retrieved wash mit, LHSS and reacher for BADL tasks and edu re technqiue. Pt able to wash thighs, stomach, and RUE with LUE and wash mit. Pt completes BLE washing with LHSS. Pt declines washing buttocks as just cleansed by NT prior. Pt dresses with S/VC for pushing shirt over L shoulder, and able to thread BLE into pants using reacher. MOD A sit to stand for wife to complete CM. Pt returns to bed and OT completes PROM of L hand/wrist for contracture management, Exited session with pt seated in bed, exit alarm on and calllight in reach  Therapy Documentation Precautions:  Precautions Precaution Comments: colostomy, L UE WBAT (no longer needs splint), R LE WBAT (no longer needs CAM boot) as of 12/4 Restrictions Weight Bearing Restrictions: Yes LUE Weight Bearing: Weight bear through elbow only RLE Weight Bearing: Weight bearing as tolerated General:   Vital Signs:   Pain: Pain Assessment Pain Score: 0-No pain ADL:   Vision   Perception    Praxis   Exercises:   Other Treatments:     Therapy/Group: Individual Therapy  Tonny Branch 09/15/2019, 2:00 PM

## 2019-09-15 NOTE — Progress Notes (Signed)
Physical Therapy Session Note  Patient Details  Name: DEMARRI ELIE MRN: [id number] Date of Birth: Dec 09, 1960  Today's Date: 09/15/2019 PT Individual Time: 0808-0905 PT Individual Time Calculation (min): 57 min   Short Term Goals: Week 2:  PT Short Term Goal 1 (Week 2): Pt will ambulate 54f with mod assist and LRAD PT Short Term Goal 2 (Week 2): Pt will performed sit<>stand with min assist PT Short Term Goal 3 (Week 2): Pt will propell WC >1579fwith supervision assist  Skilled Therapeutic Interventions/Progress Updates: Pt presented in bed with NT present completing peri-care after BM. Pt agreeable to therapy. Pt stating pain level 8/10 recently received tramadol. Pt agreeable to bedside therex due to significant pain. Ortho MD arrived during session to assess. Stated may be inflammation of scar tissue from manipulation, discussed continued ice and may add anti-inflammatory meds. Pt then participated in following supine therex: QS, GS, AA heel slides, hip abd/add, SAQ, AA SLR 2 x 10 bilaterally with intermittent rest breaks due to pain. After discussion with pt agreeable to try [place] edema/pain management. Pt had hair clippers present. PTA shaved leg with pt's consent and discussed possible reactions to skin with pt and wife who had arrived verbalized understanding. PTA applied kinesotape and once completed left pt resting with call bell within reach and needs met.      Therapy Documentation Precautions:  Precautions Precaution Comments: colostomy, L UE WBAT (no longer needs splint), R LE WBAT (no longer needs CAM boot) as of 12/4 Restrictions Weight Bearing Restrictions: Yes LUE Weight Bearing: Weight bear through elbow only RLE Weight Bearing: Weight bearing as tolerated General:   Vital Signs:   Pain: Pain Assessment Pain Score: 0-No pain   Therapy/Group: Individual Therapy  Effie Wahlert  Marvion Bastidas, PTA  09/15/2019, 12:49 PM

## 2019-09-15 NOTE — Progress Notes (Signed)
Speech Language Pathology Daily Session Note  Patient Details  Name: [name] MRN: [id number] Date of Birth: 09-14-1961  Today's Date: 09/15/2019 SLP Individual Time: 1130-1200 SLP Individual Time Calculation (min): 30 min  Short Term Goals: Week 2: SLP Short Term Goal 1 (Week 2): Pt will participate in ongoing assessment of compensatory strategies at bedside to decrease overt s/s of aspiration with thin liquids. SLP Short Term Goal 2 (Week 2): Pt will consume trials of dysphagia 3 with Min A cues for liquid wash and effortful swallow to reduce pharyngeal reduce. SLP Short Term Goal 3 (Week 2): Pt will complete 3 sets of 10 pharyngeal strengthening exercises specifically targeting base of tongue strength with Min A cues. SLP Short Term Goal 4 (Week 2): Pt will complete 3 sets of 15 resp of EMST and 3 sets of 10 reps of IMST with MIn A cues and self-perceived effort of 8 out of 10. SLP Short Term Goal 5 (Week 2): Pt will incresae vocal intensity at the conversation level to convey abstract information in noisy environment to achive ~ 90% intelligibility with supervision level cues.  Skilled Therapeutic Interventions: Skilled ST services focused on education, speech and swallow skills. [name] present and SLP provided education on pharyngeal strategies, diet trial upgraded, swallow strategies and RMT for respiratory support, all questions answered to satisfaction. SLP facilitated base of tongue exercise masako, pt completed x6 throughout session. Pt completed IMST 10 sets of 3 and EMSt 10 set of 1, self-effort level rated 6-7 out of 10. Pt agreed to completed EMST and try for x4 masako exercises outside of therapy session. Pt consumed thin carbonated via straw 4oz with no overt s/s aspiration and supervision A verbal cues for chin tuck/left head turn. Pt consumed dys 3 trial snack with x1 delayed cough and preformed effortful swallow. Pt was left in room with wife, call bell within reach and bed   alarm set. ST recommends to continue skilled ST services.      Pain Pain Assessment Pain Score: 0-No pain  Therapy/Group: Individual Therapy  [name]  [employer] 09/15/2019, 12:03 PM

## 2019-09-15 NOTE — Progress Notes (Signed)
Physical Therapy Session Note  Patient Details  Name: [name] MRN: [id number] Date of Birth: Oct 22, 1960  Today's Date: 09/15/2019 PT Individual Time: 9678-9381 PT Individual Time Calculation (min): 40 min   Short Term Goals: Week 1:  PT Short Term Goal 1 (Week 1): Pt will transfer to [facility] with min assist PT Short Term Goal 2 (Week 1): Pt will propell WC 192f with min assist using Hemi technique. PT Short Term Goal 3 (Week 1): Pt will initiate gait training. PT Short Term Goal 4 (Week 1): Pt will perform bed mobility with min assist Week 2:  PT Short Term Goal 1 (Week 2): Pt will ambulate 360fwith mod assist and LRAD PT Short Term Goal 2 (Week 2): Pt will performed sit<>stand with min assist PT Short Term Goal 3 (Week 2): Pt will propell WC >15073fith supervision assist  Skilled Therapeutic Interventions/Progress Updates:   Pt received supine in bed and agreeable to PT. Supine>sit transfer with supervision assist min cues for safety and improved knee ROM as tolerable.   PT assisted to don Cam walker and Lshoe/AFO while pt sitting EOB. Squat pivot transfer to WC [facility] CGA assist for safety and min cues for set up and improved technique.   Squat pivot transfer to nustep with min assist for safety and cues for set up given discomfort in bil knees. nustep reciprocal movement and AAROM for the RLE x 6 min, level 3, with moderate cues for improved RLE knee ROM as tolerable. Pt also utilized L hand support to improve shoulder ROM and encourage reciprocal UE movement as well. Improved knee ROM on the R noted upon completion 45 to ~60 deg flexion  Pt performd squat pivot to mat table with close supervision assist from PT to the R with min cues for increased knee flexion to improve safety. Sit<>stand from mat table x 3 from elevated surface with min-mod assist and UE support on L PFRW. Pt performed forward step 2 x 2 BLE with min assist from PT for safety. No knee instability noted or reported  by pt.  Stand pivot [facility] overall with PFRW and moderate cues for gait pattern and AD management.   Pt returned to room and performed lateral scoot transfer to bed with min assist for safety. Sit>supine completed with supervision assist with min cues for technique and awareness of the LUE, and left supine in bed with call bell in reach and all needs met.            Therapy Documentation Precautions:  Precautions Precaution Comments: colostomy, L UE WBAT (no longer needs splint), R LE WBAT (no longer needs CAM boot) as of 12/4 Restrictions Weight Bearing Restrictions: Yes LUE Weight Bearing: Weight bear through elbow only RLE Weight Bearing: Weight bearing as tolerated Vital Signs: Therapy Vitals Temp: 99.1 F (37.3 C) Pulse Rate: 90 Resp: 18 BP: 120/86 Patient Position (if appropriate): Lying Oxygen Therapy SpO2: 97 % O2 Device: Room Air Pain: 5/10 R knee sore/stiff. Pt repositioned. Ambulation increased.   Therapy/Group: Individual Therapy  AusLorie Phenix/06/2019, 4:22 PM

## 2019-09-15 NOTE — Consult Note (Signed)
WOC Nurse ostomy follow up Stoma type/location: RUQ ileostomy  Wife at bedside to perform pouch change today. She is concerned about odor and leaking.  I informed her that we are applying a convex pouch and barrier ring as well as a filtered pouch.  WE will observe and see how this pouch selection works.  Stomal assessment/size: abdominal body hair is clipped with scissors today. Stoma is flush Peristomal assessment: intact  Some blanchable erythema from 4 to 6 o'clock.  Wife states this has been there.  Treatment options for stomal/peristomal skin: Barrier ring and convex pouch Output  Liquid yellow stool Ostomy pouching: 1pc.convex with barrier ring Education provided: Wife removes pouch. Cleanses and trims body hair.  Barrier ring and convex pouch.  Discussed adding abelt for extra security but patient prefers to keep pouch angled to the side at this time.  Enrolled patient in [place] [employer] Discharge program: Yes  Is pleased with curved scissors in kit.  Will continue to follow at this time.   Sanders MSN, RN, FNP-BC [facility]ON Wound, Ostomy, Continence Nurse Pager [phone]  

## 2019-09-15 NOTE — Progress Notes (Signed)
Ortho trauma progress Note  Having medially based knee pain. With swelling on exam. Unable to tolerate a lot of motion of knee due to pain. Worsened in last few days.  PE: RLE: Medial tenderness with mild swelling. No effusion. Tolerates about 60 deg of flexion. Straight leg raise intact  58 yo male s/p multitrauma with bilateral knee flexion contractures  Having some increased inflammation from manipulation. Recommend oral antiinflammatories (ordered for this AM-Naproxen 500mg  BID) Modalities PRN including ICE and heat Continue CPM and ROM as tolerated. No restrictions Will continue to follow  Shona Needles, MD [facility] [phone] (office) [email or web address]

## 2019-09-16 ENCOUNTER — Inpatient Hospital Stay (HOSPITAL_COMMUNITY): Payer: 59 | Admitting: Occupational Therapy

## 2019-09-16 ENCOUNTER — Inpatient Hospital Stay (HOSPITAL_COMMUNITY): Payer: 59 | Admitting: Physical Therapy

## 2019-09-16 ENCOUNTER — Inpatient Hospital Stay (HOSPITAL_COMMUNITY): Payer: 59 | Admitting: Speech Pathology

## 2019-09-16 NOTE — Progress Notes (Signed)
Physical Therapy Weekly Progress Note  Patient Details  Name: [name] MRN: [id number] Date of Birth: 1961/07/08  Beginning of progress report period: September 09, 2019 End of progress report period: September 16, 2019  Today's Date: 09/16/2019 PT Individual Time: 0800-0905 PT Individual Time Calculation (min): 65 min   Patient has met 2 of 3 short term goals.  Pt has been making slow progress towards LTG over the past week. Following manipulation, pt has had decreased tolerance to OOB activity due to Bil knee stiffness and pain R>L. Currently min assist for bed mobility, and transfers including sit<>stand and squat pivot. Pt has regressed in ability to ambulate due to knee pain since previous weekly note, tolerating only stand pivot transfers at this point.   Patient continues to demonstrate the following deficits muscle weakness, muscle joint tightness and muscle paralysis, decreased cardiorespiratoy endurance, abnormal tone, unbalanced muscle activation and decreased coordination and decreased sitting balance, decreased standing balance, decreased postural control, hemiplegia and decreased balance strategies and therefore will continue to benefit from skilled PT intervention to increase functional independence with mobility.  Patient progressing toward long term goals..  Continue plan of care.  PT Short Term Goals Week 1:  PT Short Term Goal 1 (Week 1): Pt will transfer to [facility] with min assist PT Short Term Goal 2 (Week 1): Pt will propell WC 152f with min assist using Hemi technique. PT Short Term Goal 3 (Week 1): Pt will initiate gait training. PT Short Term Goal 4 (Week 1): Pt will perform bed mobility with min assist Week 2:  PT Short Term Goal 1 (Week 2): Pt will ambulate 385fwith mod assist and LRAD PT Short Term Goal 1 - Progress (Week 2): Not met PT Short Term Goal 2 (Week 2): Pt will performed sit<>stand with min assist PT Short Term Goal 2 - Progress (Week 2): Met PT Short  Term Goal 3 (Week 2): Pt will propell WC >15045fith supervision assist PT Short Term Goal 3 - Progress (Week 2): Met Week 3:  PT Short Term Goal 1 (Week 3): Pt will consistently perform sit<>stand transfers with CGA from WC height of 21 inches. PT Short Term Goal 2 (Week 3): Pt will ambulate >19f89fth min assist and LRAD PT Short Term Goal 3 (Week 3): [facility] PT Short Term Goal 4 (Week 3): Pt will initiate stair management training with mod assist for 1, 3inch step.  Skilled Therapeutic Interventions/Progress Updates:   Pt received supine in bed and agreeable to PT. Supine>sit transfer with CGA for safety and for use of bed rails as needs.  PT donned L Cam boot and R AFO. Squat [facility] for improved push through BLE to allow full lift from sitting surface.   WC mobility with BLE and BUE x 150ft72fh moderate cues for improved symmetry of BUE and to allow increased knee flexion in the RLE as tolerated.   Squat pivot transfers throughout treatment with CGA-min assist for safety as well as cues for set up to limit pain in the R knee, protect the L wrist and improve clearance of gluteal surface to prevent skin breakdown. Sit<>stand from elevated mat table x 4 with min assist overall and BUE on RW. Cues for improved anterior weight shift and improved LE placement to improve safety and success.   pregait stepping task 3 x 3 BLE with min assist overall and min-mod cues for  posture, symmetrical use of BUE on RW and symmetry of step width for BLE.   AROM for knee flexion/extension sitting EOB 3 x 1 min with mild overpressure on for the last 2 reps of each bout. PT measured knee flexion RLE 95 deg, LLE 78 deg.  Short distance ambulatory transfer to [facility] with RW and min-mod assist or safety. Min cues for sequencing and posture as well as increased glute activation on the R to normalize gait pattern.   Nustep reciprocal movement and  AAROM x 6 min with BUE and BLE. Pt noted to have significantly improved ROM in the R knee compared to yesterday with Bil knees at ~80deg at full flexion.   Pt returned to room and performed sqaut transfer to bed with CGA as listed above. Sit>supine completed with supervision assist and min cues for sequecning to improve coordination and control of BLE and compensate for pain and weakness. Pt left supine in bed with call bell in reach and all needs met.           Therapy Documentation Precautions:  Precautions Precaution Comments: colostomy, L UE WBAT (no longer needs splint), R LE WBAT (no longer needs CAM boot) as of 12/4 Restrictions Weight Bearing Restrictions: Yes LUE Weight Bearing: Weight bear through elbow only RLE Weight Bearing: Weight bearing as tolerated    Pain:   3/10 R ankle and knee. Pt repositioned see MAR for pain meds.   Therapy/Group: Individual Therapy  [name] 09/16/2019, 9:52 AM

## 2019-09-16 NOTE — Progress Notes (Signed)
Speech Language Pathology Daily Session Note  Patient Details  Name: [name] MRN: [id number] Date of Birth: May 23, 1961  Today's Date: 09/16/2019 SLP Individual Time: 1105-1200 SLP Individual Time Calculation (min): 55 min  Short Term Goals: Week 2: SLP Short Term Goal 1 (Week 2): Pt will participate in ongoing assessment of compensatory strategies at bedside to decrease overt s/s of aspiration with thin liquids. SLP Short Term Goal 2 (Week 2): Pt will consume trials of dysphagia 3 with Min A cues for liquid wash and effortful swallow to reduce pharyngeal reduce. SLP Short Term Goal 3 (Week 2): Pt will complete 3 sets of 10 pharyngeal strengthening exercises specifically targeting base of tongue strength with Min A cues. SLP Short Term Goal 4 (Week 2): Pt will complete 3 sets of 15 resp of EMST and 3 sets of 10 reps of IMST with MIn A cues and self-perceived effort of 8 out of 10. SLP Short Term Goal 5 (Week 2): Pt will incresae vocal intensity at the conversation level to convey abstract information in noisy environment to achive ~ 90% intelligibility with supervision level cues.  Skilled Therapeutic Interventions: Pt was seen for skilled ST targeting dysphagia and speech intelligibility. Pt consumed upgraded Dys 3 solid snack with thin H2O without overt s/sx aspiration. He required Min A verbal cues for use of liquid wash, however he was Mod I for verbal recall and use of chin tuck and effortful swallow throughout intake. Pt completed EMST exercises X10 with device set to 60cm H2O with self-perceived effort of 6 out of 10. He reported when he attempted to complete EMST exercises at this level yesterday it was too difficult and was surprised at his ability to complete it at this level today. Discussed how level of fatigue and pain may influence performance and perceived difficulty. Pt also completed IMST exercises X10 with device set to 35cm H2O with self-perceived difficulty level of 6 out of  10. SLP increased IMST device to 37cm H2O and pt completed X5 with self-perceived difficulty level of 8. Left device set to 37cm H2O. Pt also demonstrated successful Masako X3, however he had difficulty triggering swallow and could not complete any additional pharyngeal strengthening exercises. During conversational tasks, pt was 95% intelligible at the conversation level with Supervision A verbal cues for intentional use of increased vocal intensity - much improved since this SLP's last visit with pt last Sunday. Of note, it was a relatively quiet environment and pt would benefit from targeting speech intelligibility in a mildly distracting environment in the future. Pt left laying in bed with alarm set and all needs within reach. Continue per current plan of care.       Pain Pain Assessment Pain Scale: Faces Faces Pain Scale: Hurts a little bit Pain Type: Acute pain Pain Location: Knee Pain Orientation: Right Pain Descriptors / Indicators: Aching Pain Onset: On-going Pain Intervention(s): Other (Comment)(had already recieved pain meds, per pt report) Multiple Pain Sites: No  Therapy/Group: Individual Therapy  Arbutus Leas 09/16/2019, 12:08 PM

## 2019-09-16 NOTE — Progress Notes (Signed)
[facility] PHYSICAL MEDICINE & REHABILITATION PROGRESS NOTE   Subjective/Complaints:  Pt feels RLE pain is better, PT feels it is less swollen, Kinesiotape to R medial knee   ROS- neg CP, SOB,  N/V/D; no abd pain   Objective:   No results found. No results for input(s): WBC, HGB, HCT, PLT in the last 72 hours. Recent Labs    09/14/19 0541  NA 129*  K 4.1  CL 93*  CO2 22  GLUCOSE 116*  BUN 26*  CREATININE 0.86  CALCIUM 9.9    Intake/Output Summary (Last 24 hours) at 09/16/2019 0826 Last data filed at 09/16/2019 0551 Gross per 24 hour  Intake 360 ml  Output 925 ml  Net -565 ml     Physical Exam: Vital Signs Blood pressure 107/77, pulse 77, temperature 97.7 F (36.5 C), temperature source Oral, resp. rate 16, weight 91.2 kg, SpO2 100 %. Vitals reviewed and labs reviewed Constitutional: up in manual w/c, wife at side; with PT; cortrak out; looks MUCH better; good color,  NAD;  appears flat/tearful after therapy when saw 2nd time HENT:  Head: Normocephalic and atraumatic.    Eyes: EOM are normal.  Neck: No tracheal deviation present. No thyromegaly present. Cortrak as above Respiratory: Effort normal. No respiratory distress.  GI: He exhibits no distension.  +Ileostomy -NT, ND Musculoskeletal:     Comments:   + right  knee tenderness, no effusion  + left shoulder tenderness. Incision C/D/I No evidence of knee effusion bilaterally , no erythema around knees or ecchymosis, no jt line tenderness Neurological: He is alert and oriented to person, place, and time.  Motor: Right upper extremity: 4 -/5 proximal distal Left upper extremity: Shoulder abduction, elbow flexion/extension 3- /5, handgrip trace  right lower extremity is in the CPM and cannot be tested. Left lower extremity has 3 - at the hip flexors knee extensors 4 - and ankle dorsiflexors 3 - Sensation intact light touch Dysarthria Left facial weakness improving  MSK limited right knee ROM,no erythema   Psychiatric: Flat no lability or agitation   Assessment/Plan: 1. Functional deficits secondary to acute brainstem infarct which require 3+ hours per day of interdisciplinary therapy in a comprehensive inpatient rehab setting.  Physiatrist is providing close team supervision and 24 hour management of active medical problems listed below.  Physiatrist and rehab team continue to assess barriers to discharge/monitor patient progress toward functional and medical goals  Care Tool:  Bathing    Body parts bathed by patient: Left arm, Chest, Abdomen, Face, Right upper leg, Left upper leg, Right arm, Right lower leg, Left lower leg         Bathing assist Assist Level: Minimal Assistance - Patient > 75%     Upper Body Dressing/Undressing Upper body dressing   What is the patient wearing?: Pull over shirt    Upper body assist Assist Level: Supervision/Verbal cueing    Lower Body Dressing/Undressing Lower body dressing      What is the patient wearing?: Pants     Lower body assist Assist for lower body dressing: Moderate Assistance - Patient 50 - 74%     Toileting Toileting    Toileting assist Assist for toileting: Minimal Assistance - Patient > 75% Assistive Device Comment: urinal   Transfers Chair/bed transfer  Transfers assist           Locomotion Ambulation   Ambulation assist              Walk 10 feet activity  Assist           Walk 50 feet activity   Assist           Walk 150 feet activity   Assist           Walk 10 feet on uneven surface  activity   Assist           Wheelchair     Assist               Wheelchair 50 feet with 2 turns activity    Assist            Wheelchair 150 feet activity     Assist          Blood pressure 107/77, pulse 77, temperature 97.7 F (36.5 C), temperature source Oral, resp. rate 16, weight 91.2 kg, SpO2 100 %.  Medical Problem List and Plan: 1.   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             -ELOS 12/22            CIR Level PT , OT , SLP - Team conference today please see physician documentation under team conference tab, met with team face-to-face to discuss problems,progress, and goals. Formulized individual treatment plan based on medical history, underlying problem and comorbidities. Per therapy exercise tolerance has worsened after manipulation may need to extend the stay  2.  Antithrombotics: -DVT/anticoagulation:  Pharmaceutical: Heparin doppler neg for DVT              -antiplatelet therapy: ASA 3. Pain Management: tylenol for mild and oxycodone for severe pain.              Monitor with increased mobility Doesn't like how pain meds make him feel   12/8: For right knee pain, will add diclofenac gel. For left shoulder pain, will add Lidocaine patch, will ask Ortho to eval effusion, may need arthrocentesis will contact4. Mood: LCSW to follow for support and evaluation. Team to provide ego support             -antipsychotic agents: N/A.  5. Neuropsych: This patient is capable of making decisions on his own behalf. 6. Skin/Wound Care: routine pressure relief measures., abd wound healing  Miliarsia- likely from heat related to overlay, change benadryl to calamine 7. Fluids/Electrolytes/Nutrition: Monitor I/O. Encourage intake--he's afraid to eat due to concerns of return of abdominal pain.off TF     12/7 Na 131,129 on 12/8 8. Right pontine stroke: On ASA for secondary stroke prevention.  9. Mesenteric injuries s/p Ileocecectomy, total colectomy and repair of lacerations: Foley to collect high volume out of ileostomy."liquidy stools expected given lack of lg colon  Lomotil prn 10. Fistula/leak/abscess: completed  Zosyn 11/15-->11/29. 12. Situational depression with insomnia: Continue Seroquel at nights.   13. Right knee pain: Question MRI for workup.  14.  ORIF left clavicle and left distal radius fracture: NWB left wrist--may WB thorough elbow with platform walker. 15.  IM nailing right femur and ORIF right bimalleolar Fx: .  16. Anemia:likely ABLA  11/30 CBC stable, improved to 10.6 on 12/7 17.  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 He has CPM ordered from 0 to 90, was ranged under GA to 125 deg, patient increased rapidly on the right side and has soreness related to this.  On the left side  he went up to 115 degrees.  He has goals of 125 degrees.  Recommend that he increase his by no more than 5 degrees/day on the CPM degrees.  Continue aggressive rehab efforts 18.  Left MCP, PIP and DIP contracture- extention, will ask OT to perform passive flexion of fingers   19.  Papular rash upper chest and thighs, miliaria-  calamine working well   20.  Dysphagia post stroke, d/w SLP should have normal sensation due to pontine location, may have anxiety component LOS: 6 days A FACE TO FACE EVALUATION WAS PERFORMED  Charlett Blake 09/16/2019, 8:26 AM

## 2019-09-16 NOTE — Progress Notes (Signed)
Occupational Therapy Session Note  Patient Details  Name: [name] MRN: [id number] Date of Birth: 06/13/1961  Today's Date: 09/16/2019 OT Individual Time: 1415-1530 OT Individual Time Calculation (min): 75 min    Short Term Goals: Week 2:  OT Short Term Goal 1 (Week 2): STGs=LTGs due to ELOS  Skilled Therapeutic Interventions/Progress Updates:    Upon entering the room, pt supine in bed upon entering the room. OT applied heat to L hand while discussing todays therapy sessions, goals, and plans for this session. OT proving PROM to L digits with increase in flexion noted and less pain reported with stretching when using the heat. Pt performed supine >sit with min A to EOB. OT assisted pt with donning L CAM boot and R shoe with AFO. Pt performed squat pivot transfer with min guard. Pt seated at sink for grooming tasks with supervision. Pt propelled wheelchair with B LEs and B UEs 100' to day room with supervision and increased time. Pt transferred onto NuStep with min guard and utilized B UE and B LEs for ROM for 6 minutes. Pt returning to wheelchair and propelled self back to room in same manner. Pt managed urinal himself and transferred back into bed with min guard squat pivot. Pt bridging and pulling down LB clothing and brief to wash with OT assisting to place new brief. Pt needing min A to pull pants back over L hip. Pt remained in bed with call bell and all needed items within reach upon exiting the room.   Therapy Documentation Precautions:  Precautions Precaution Comments: colostomy, L UE WBAT (no longer needs splint), R LE WBAT (no longer needs CAM boot) as of 12/4 Restrictions Weight Bearing Restrictions: Yes LUE Weight Bearing: Weight bear through elbow only RLE Weight Bearing: Weight bearing as tolerated Vital Signs: Therapy Vitals Temp: 98 F (36.7 C) Temp Source: Oral Pulse Rate: 79 BP: 103/81 Oxygen Therapy SpO2: 100 %   Therapy/Group: Individual  Therapy  Gypsy Decant 09/16/2019, 4:39 PM

## 2019-09-17 ENCOUNTER — Inpatient Hospital Stay (HOSPITAL_COMMUNITY): Payer: 59 | Admitting: Speech Pathology

## 2019-09-17 ENCOUNTER — Inpatient Hospital Stay (HOSPITAL_COMMUNITY): Payer: 59 | Admitting: Occupational Therapy

## 2019-09-17 ENCOUNTER — Inpatient Hospital Stay (HOSPITAL_COMMUNITY): Payer: 59 | Admitting: Physical Therapy

## 2019-09-17 NOTE — Progress Notes (Signed)
[facility] PHYSICAL MEDICINE & REHABILITATION PROGRESS NOTE   Subjective/Complaints:  Mucus d/c per rectum, will add bentyl Did not get CPM yesterday has avoided it due to pain for several day, I have revised orders, Left leg doing ok maintaining ~100deg post manipulation    ROS- neg CP, SOB,  N/V/D; no abd pain   Objective:   No results found. No results for input(s): WBC, HGB, HCT, PLT in the last 72 hours. No results for input(s): NA, K, CL, CO2, GLUCOSE, BUN, CREATININE, CALCIUM in the last 72 hours.  Intake/Output Summary (Last 24 hours) at 09/17/2019 0855 Last data filed at 09/17/2019 0730 Gross per 24 hour  Intake 478 ml  Output 1475 ml  Net -997 ml     Physical Exam: Vital Signs Blood pressure 115/81, pulse 74, temperature 97.8 F (36.6 C), temperature source Oral, resp. rate 18, weight 93.9 kg, SpO2 100 %. Vitals reviewed and labs reviewed Constitutional: up in manual w/c, wife at side; with PT; cortrak out; looks MUCH better; good color,  NAD;  appears flat/tearful after therapy when saw 2nd time HENT:  Head: Normocephalic and atraumatic.    Eyes: EOM are normal.  Neck: No tracheal deviation present. No thyromegaly present. Cortrak as above Respiratory: Effort normal. No respiratory distress.  GI: He exhibits no distension.  +Ileostomy -NT, ND Musculoskeletal:     Comments:   + right  knee tenderness, no effusion  + left shoulder tenderness. Incision C/D/I No evidence of knee effusion bilaterally , no erythema around knees or ecchymosis, no jt line tenderness Neurological: He is alert and oriented to person, place, and time.  Motor: Right upper extremity: 4 -/5 proximal distal Left upper extremity: Shoulder abduction, elbow flexion/extension 3- /5, handgrip trace  right lower extremity is in the CPM and cannot be tested. Left lower extremity has 3 - at the hip flexors knee extensors 4 - and ankle dorsiflexors 3 - Sensation intact light  touch Dysarthria Left facial weakness improving  MSK limited right knee ROM,no erythema  Psychiatric: Flat no lability or agitation   Assessment/Plan: 1. Functional deficits secondary to acute brainstem infarct which require 3+ hours per day of interdisciplinary therapy in a comprehensive inpatient rehab setting.  Physiatrist is providing close team supervision and 24 hour management of active medical problems listed below.  Physiatrist and rehab team continue to assess barriers to discharge/monitor patient progress toward functional and medical goals  Care Tool:  Bathing    Body parts bathed by patient: Left arm, Chest, Abdomen, Face, Right upper leg, Left upper leg, Right arm, Right lower leg, Left lower leg         Bathing assist Assist Level: Minimal Assistance - Patient > 75%     Upper Body Dressing/Undressing Upper body dressing   What is the patient wearing?: Pull over shirt    Upper body assist Assist Level: Supervision/Verbal cueing    Lower Body Dressing/Undressing Lower body dressing      What is the patient wearing?: Pants     Lower body assist Assist for lower body dressing: Moderate Assistance - Patient 50 - 74%     Toileting Toileting    Toileting assist Assist for toileting: Minimal Assistance - Patient > 75% Assistive Device Comment: urinal   Transfers Chair/bed transfer  Transfers assist     Chair/bed transfer assist level: Contact Guard/Touching assist     Locomotion Ambulation   Ambulation assist  Walk 10 feet activity   Assist           Walk 50 feet activity   Assist           Walk 150 feet activity   Assist           Walk 10 feet on uneven surface  activity   Assist           Wheelchair     Assist               Wheelchair 50 feet with 2 turns activity    Assist            Wheelchair 150 feet activity     Assist          Blood pressure 115/81,  pulse 74, temperature 97.8 F (36.6 C), temperature source Oral, resp. rate 18, weight 93.9 kg, SpO2 100 %.  Medical Problem List and Plan: 1.  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             -ELOS 12/22            CIR Level PT , OT , SLP -  comorbidities. Per therapy exercise tolerance has worsened after manipulationneed to extend the stay  2.  Antithrombotics: -DVT/anticoagulation:  Pharmaceutical: lovenox 40mg  daily  doppler neg for DVT              -antiplatelet therapy: ASA 3. Pain Management: tylenol for mild and oxycodone for severe pain.              Monitor with increased mobility Doesn't like how pain meds make him feel   12/8: For right knee pain, will add diclofenac gel. For left shoulder pain, will add Lidocaine patch, will ask Ortho to eval effusion, may need arthrocentesis will contact4. Mood: LCSW to follow for support and evaluation. Team to provide ego support             -antipsychotic agents: N/A.  5. Neuropsych: This patient is capable of making decisions on his own behalf. 6. Skin/Wound Care: routine pressure relief measures., abd wound healing  Miliarsia- likely from heat related to overlay, change benadryl to calamine 7. Fluids/Electrolytes/Nutrition: Monitor I/O. Encourage intake--he's afraid to eat due to concerns of return of abdominal pain.off TF     12/7 Na 131,129 on 12/8 repeat 12/14 8. Right pontine stroke: On ASA for secondary stroke prevention.  9. Mesenteric injuries s/p Ileocecectomy, total colectomy and repair of lacerations: Foley to collect high volume out of ileostomy."liquidy stools expected given lack of lg colon  Lomotil prn 10. Fistula/leak/abscess: completed  Zosyn 11/15-->11/29. 12. Situational depression with insomnia: Continue Seroquel at nights.   13. Right knee pain: Question MRI for workup.  14. ORIF left clavicle and left distal radius fracture: NWB left wrist--may  WB thorough elbow with platform walker. 15.  IM nailing right femur and ORIF right bimalleolar Fx: .  16. Anemia:likely ABLA  11/30 CBC stable, improved to 10.6 on 12/7 17.  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 He has CPM ordered from 0 to 90, was ranged under GA to 125 deg, patient increased rapidly on the right side and has soreness related to this.  On the left side he went up to 115 degrees.  He has goals of 125 degrees.  Recommend that he increase his by no  more than 5 degrees/day on the CPM, does not tolerate well on right side left side at 100deg , did not get in CPM yesterday per pt report  .  Continue aggressive rehab efforts 18.  Left MCP, PIP and DIP contracture- extention, will ask OT to perform passive flexion of fingers   19.  Papular rash upper chest and thighs, miliaria-  calamine working well   20.  Dysphagia post stroke, d/w SLP should have normal sensation due to pontine location, may have anxiety component LOS: 7 days A FACE TO FACE EVALUATION WAS PERFORMED  Charlett Blake 09/17/2019, 8:55 AM

## 2019-09-17 NOTE — Progress Notes (Signed)
Physical Therapy Session Note  Patient Details  Name: [name] MRN: [id number] Date of Birth: December 05, 1960  Today's Date: 09/17/2019 PT Individual Time: 0800-0910 PT Individual Time Calculation (min): 70 min   Short Term Goals: Week 3:  PT Short Term Goal 1 (Week 3): Pt will consistently perform sit<>stand transfers with CGA from WC height of 21 inches. PT Short Term Goal 2 (Week 3): Pt will ambulate >57f with min assist and LRAD PT Short Term Goal 3 (Week 3): Pt will [facility] to [facility] and LRAD consistently PT Short Term Goal 4 (Week 3): Pt will initiate stair management training with mod assist for 1, 3inch step.  Skilled Therapeutic Interventions/Progress Updates:   Pt received supine in bed and agreeable to PT. Supine>sit transfer with supervision assist and min cues for decreased use of bed rail. PT assisted pt to don pants with max assist due to knee stiffness. Total A from PT to don Cam boot and LAFO. Sit<>stand from elevated bed with min assist. PT pulled pants to waist.  Squat pivot transfer to [facility]with supervision assist from PT and min cue for improved LE position to allow improved symmetry through BLE. Pt transported to day roo.   [facility]<>[facility] assist from WR[facility]roughout treatment. Pt then performed stand pivot transfer to nustep with min assist once in standing with cues for posture and step width.    Nustep reciprol AAROM, self paced by pt x 8 minutes. R knee initially limited to ~50 deg and improved to ~70 by the end of 8 minutes.  Stand pivot transfer back to [facility]with mod assist initially then min assist as before.   Knee flexion extension with intermittent overpressure for 5 sec hold 3 x 10 BLE.   PT assisted pt to don RAFO with anterior support and shoe to improved DF and knee stability. Gait trianing with RW and Bil AFO x 242fwith min assist overall from PT. Min cues for posture and step length Bil to normalize gait pattern and improve safety. No  knee or ankle stability noted from PT  Throughout.    Pt returned to room and performed ambulatory transfer to bed with min assist once in standing from PT as well as min cues for AD management around turns and improve step width to prevent lateral LOB. Sit>supine completed with min assist at the RLE and left supine in bed with call bell in reach and all needs met.          Therapy Documentation Precautions:  Precautions Precaution Comments: colostomy, L UE WBAT (no longer needs splint), R LE WBAT (no longer needs CAM boot) as of 12/4 Restrictions Weight Bearing Restrictions: Yes LUE Weight Bearing: Weight bearing as tolerated RLE Weight Bearing: Weight bearing as tolerated    Pain: Pain Assessment Pain Scale: 0-10 Pain Score: 8  Pain Location: Knee Pain Orientation: Right Pain Radiating Towards: (R) Ankle  Pain Descriptors / Indicators: Aching Pain Frequency: Constant Pain Onset: On-going Patients Stated Pain Goal: 0 Pain Intervention(s): Medication (See eMAR);Repositioned;Emotional support    Therapy/Group: Individual Therapy  AuLorie Phenix2/08/2019, 11:33 AM

## 2019-09-17 NOTE — Progress Notes (Signed)
Occupational Therapy Session Note  Patient Details  Name: [name] MRN: [id number] Date of Birth: 07/26/1961  Today's Date: 09/17/2019 OT Individual Time: [id number] OT Individual Time Calculation (min): 55 min   Short Term Goals: Week 2:  OT Short Term Goal 1 (Week 2): STGs=LTGs due to ELOS  Skilled Therapeutic Interventions/Progress Updates:    Pt greeted in bed, hooked up to CPM machine and premedicated for pain though with pain c/o 8/10. Also had a pain patch on his Lt shoulder. Started with active assist ROM and stretching to Lt hand and shoulder with MHP. Instructed pt through active assist techniques and also reviewed his self ROM HEP handout. Min vcs for technique and joint protection while he completed his HEP exercises. Educated pt on importance of daily ROM outside of therapies for contracture prevention and edema mgt. Pt receptive to education, declining OOB mobility or transferring during session. Strongly encouraged pt to transfer OOB with RN staff later in the day and he verbalized understanding. Before end of tx pt reported his pain had gone done to 7/10. Left him with all needs within reach.     Therapy Documentation Precautions:  Precautions Precaution Comments: colostomy, L UE WBAT (no longer needs splint), R LE WBAT (no longer needs CAM boot) as of 12/4 Restrictions Weight Bearing Restrictions: Yes LUE Weight Bearing: Non weight bearing(Wrist ) RLE Weight Bearing: Weight bearing as tolerated Pain: Pain Assessment Pain Scale: 0-10 Pain Score: 8  Pain Location: Knee Pain Orientation: Right Pain Radiating Towards: (R) Ankle  Pain Descriptors / Indicators: Aching Pain Frequency: Constant Pain Onset: On-going Patients Stated Pain Goal: 0 Pain Intervention(s): Medication (See eMAR);Repositioned;Emotional support      Therapy/Group: Individual Therapy  Zohair Epp A Suni Jarnagin 09/17/2019, 12:22 PM

## 2019-09-17 NOTE — Progress Notes (Signed)
Speech Language Pathology Weekly Progress and Session Note  Patient Details  Name: QUITMAN NORBERTO MRN: [id number] Date of Birth: 1961-01-15  Beginning of progress report period: September 10, 2019 End of progress report period: September 17, 2019  Today's Date: 09/17/2019 SLP Individual Time: 1000-1030 SLP Individual Time Calculation (min): 30 min  Short Term Goals: Week 2: SLP Short Term Goal 1 (Week 2): Pt will participate in ongoing assessment of compensatory strategies at bedside to decrease overt s/s of aspiration with thin liquids. SLP Short Term Goal 1 - Progress (Week 2): Met SLP Short Term Goal 2 (Week 2): Pt will consume trials of dysphagia 3 with Min A cues for liquid wash and effortful swallow to reduce pharyngeal reduce. SLP Short Term Goal 2 - Progress (Week 2): Met SLP Short Term Goal 3 (Week 2): Pt will complete 3 sets of 10 pharyngeal strengthening exercises specifically targeting base of tongue strength with Min A cues. SLP Short Term Goal 3 - Progress (Week 2): Progressing toward goal SLP Short Term Goal 4 (Week 2): Pt will complete 3 sets of 15 resp of EMST and 3 sets of 10 reps of IMST with MIn A cues and self-perceived effort of 8 out of 10. SLP Short Term Goal 4 - Progress (Week 2): Met SLP Short Term Goal 5 (Week 2): Pt will incresae vocal intensity at the conversation level to convey abstract information in noisy environment to achive ~ 90% intelligibility with supervision level cues. SLP Short Term Goal 5 - Progress (Week 2): Progressing toward goal    New Short Term Goals: Week 3: SLP Short Term Goal 1 (Week 3): Pt will consume trials of dysphagia 3 with Supervision A cues for liquid wash and effortful swallow to reduce pharyngeal reduce. SLP Short Term Goal 2 (Week 3): Pt will complete 3 sets of 10 pharyngeal strengthening exercises specifically targeting base of tongue strength with Min A cues. SLP Short Term Goal 3 (Week 3): Pt will complete 3 sets of 15 resp of  EMST and 3 sets of 10 reps of IMST Mod I and self-perceived effort of 8 out of 10. SLP Short Term Goal 4 (Week 3): Pt will incresae vocal intensity at the conversation level to convey abstract information in noisy environment to achive ~ 90% intelligibility with supervision level cues.  Weekly Progress Updates: Pt has made functional gains and met 3 out of 5 short term goals, however progress had fluctuated over last week due to intermittent pain and fatigue that limited participiation. Pt is currently Min-Supervision assist for due to voice disorder impacting vocal intensity and in turn overall speech intelligibility, as was as pharyngeal dysphagia. Pt is consuming Dys 2 (minced) diet with thin liquids with Min-Supervision A for use of swallow strategies. Pt has demonstrated improved use of compensatory swallow strategies during upgraded trials of Dys 3 solids. Pt and family education is ongoing. Pt would continue to benefit from skilled ST while inpatient in order to maximize functional independence and reduce burden of care prior to discharge. Anticipate that pt will need 24/7 supervision at discharge in addition to [place] follow up at next level of care.      Intensity: Minumum of 1-2 x/day, 30 to 90 minutes Frequency: 3 to 5 out of 7 days Duration/Length of Stay: 09/28/19 Treatment/Interventions: Patient/family education;Dysphagia/aspiration precaution training;Therapeutic Exercise;Speech/Language facilitation   Daily Session  Skilled Therapeutic Interventions: Pt was seen for skilled ST targeting dysphagia and speech intelligibility. Pt reported abdominal pain and fatigue that was somewhat  limiting to his participation today. Despite fatigue, pt agreeable to participate in upgraded trials of Dys 3 solids, during which he required Supervision A verbal cues for use of liquid wash and effortful swallow. No overt s/sx were observed throughout his intake of Dys 3 or thin H2O. Pt also completed EMST X10  set at 60 cm H2O and IMST X10 set at 37 cm H2O. Pt reported more self-perceived difficulty with both IMST and EMST today, and SLP noted this via skilled observation and auditory feedback from devices during exercises (less audible bursts of air in comparison to yesterday). He also required increased Min A verbal cues for repetition of sentence level utterances with intention use of increased vocal intensity and was ~80% intelligible as a result. At pt's request, session was ended early due to pain and fatigue - he missed 30 mins of skilled ST. Continue per current plan of care.       Pain Pain Assessment Pain Scale: Faces Faces Pain Scale: Hurts a little bit Pain Type: Acute pain Pain Location: Abdomen Pain Orientation: Medial Pain Descriptors / Indicators: Discomfort Pain Onset: Gradual Patients Stated Pain Goal: 0 Pain Intervention(s): RN made aware Multiple Pain Sites: No  Therapy/Group: Individual Therapy  Arbutus Leas 09/17/2019, 2:25 PM

## 2019-09-18 ENCOUNTER — Inpatient Hospital Stay (HOSPITAL_COMMUNITY): Payer: 59 | Admitting: Physical Therapy

## 2019-09-18 DIAGNOSIS — I6350 Cerebral infarction due to unspecified occlusion or stenosis of unspecified cerebral artery: Secondary | ICD-10-CM

## 2019-09-18 MED ORDER — ASPIRIN 325 MG PO TABS
325.0000 mg | ORAL_TABLET | Freq: Every day | ORAL | Status: DC
  Administered 2019-09-19 – 2019-09-28 (×10): 325 mg via ORAL
  Filled 2019-09-18 (×10): qty 1

## 2019-09-18 NOTE — Progress Notes (Signed)
Physical Therapy Session Note  Patient Details  Name: [name] MRN: [id number] Date of Birth: March 29, 1961  Today's Date: 09/18/2019 PT Individual Time: 0920-1005 PT Individual Time Calculation (min): 45 min   Short Term Goals: Week 3:  PT Short Term Goal 1 (Week 3): Pt will consistently perform sit<>stand transfers with CGA from WC height of 21 inches. PT Short Term Goal 2 (Week 3): Pt will ambulate >15f with min assist and LRAD PT Short Term Goal 3 (Week 3): [facility] PT Short Term Goal 4 (Week 3): Pt will initiate stair management training with mod assist for 1, 3inch step.  Skilled Therapeutic Interventions/Progress Updates:   Pt received supine in bed and agreeable to PT. PT assisted pt for body and perineal hygiene with set up assist for use of RUE and hand over hand to wash R arm pit with LUE. Pt able to don shirt with set up assist and mod assist from PT to don pants from bed level.  Supine>sit transfer with supervision assist from PT. PT assisted pt to don Bil AFO and shoes with max assist.  Squat pivot transfer to WC. WC mobility x 1584fusing BUE and BLE to force use of L hand and improved knee motion in BLE. tband applied to the L hand rim to improve grip and symmetrical use of BUE. Pt performed knee flexion with pillow over foot to reduce friction and allow increased flexion.   Gait training with RW x 2537fith min assist overall. Cues for AD management [facility] as cues improved posture and hip abduction activation on the R side   Stair management x 2 3" steps and x 2 6" steps. Min assist on 3 inch and mod assist on 6". Cues for posture and ascent through the LLE due to liimited knee flexion on the RLE  Patient returned to room and [facility] reach and all needs met.         Therapy Documentation Precautions:  Precautions Precaution Comments: colostomy, L UE WBAT (no longer  needs splint), R LE WBAT (no longer needs CAM boot) as of 12/4 Restrictions Weight Bearing Restrictions: Yes LUE Weight Bearing: Weight bearing as tolerated RLE Weight Bearing: Weight bearing as tolerated Vital Signs: Therapy Vitals Pulse Rate: 82 BP: 120/82 Pain: Denies at rest   Therapy/Group: Individual Therapy  AusLorie Phenix/09/2019, 10:10 AM

## 2019-09-18 NOTE — Progress Notes (Signed)
[name] PHYSICAL MEDICINE & REHABILITATION PROGRESS NOTE   Subjective/Complaints:  No new complaints. Asked about sutures in right thigh, ?abdomen  ROS: Patient denies fever, rash, sore throat, blurred vision, nausea, vomiting, diarrhea, cough, shortness of breath or chest pain,   headache, or mood change.   Objective:   No results found. No results for input(s): WBC, HGB, HCT, PLT in the last 72 hours. No results for input(s): NA, K, CL, CO2, GLUCOSE, BUN, CREATININE, CALCIUM in the last 72 hours.  Intake/Output Summary (Last 24 hours) at 09/18/2019 1254 Last data filed at 09/18/2019 0700 Gross per 24 hour  Intake 926 ml  Output 1530 ml  Net -604 ml     Physical Exam: Vital Signs Blood pressure 120/82, pulse 82, temperature 97.9 F (36.6 C), resp. rate 18, weight 94.3 kg, SpO2 100 %. Constitutional: No distress . Vital signs reviewed. HEENT: EOMI, oral membranes moist Neck: supple Cardiovascular: RRR without murmur. No JVD    Respiratory: CTA Bilaterally without wheezes or rales. Normal effort    GI: BS +, non-tender, ileostomy, granulating abdominal wound, some scab, no sutures seen  Musculoskeletal:     Comments:    + right  knee tenderness, no effusion  + left shoulder tenderness. Incision C/D/I No evidence of knee effusion bilaterally , no erythema around knees or ecchymosis, no jt line tenderness Neurological: He is alert and oriented to person, place, and time.  Motor: Right upper extremity: 4 -/5 proximal distal Left upper extremity: Shoulder abduction, elbow flexion/extension 3- /5, handgrip trace  right lower extremity is in the CPM and cannot be tested. Left lower extremity has 3 - at the hip flexors knee extensors 4 - and ankle dorsiflexors 3 - motor exam stable Sensation intact light touch Dysarthria Left facial weakness ongoing MSK limited right knee ROM,no erythema, Knee ROM 90+ flexion Skin: sutures right hip  Psychiatric: Flat no lability or  agitation   Assessment/Plan: 1. Functional deficits secondary to acute brainstem infarct which require 3+ hours per day of interdisciplinary therapy in a comprehensive inpatient rehab setting.  Physiatrist is providing close team supervision and 24 hour management of active medical problems listed below.  Physiatrist and rehab team continue to assess barriers to discharge/monitor patient progress toward functional and medical goals  Care Tool:  Bathing    Body parts bathed by patient: Left arm, Chest, Abdomen, Face, Right upper leg, Left upper leg, Right arm, Right lower leg, Left lower leg         Bathing assist Assist Level: Minimal Assistance - Patient > 75%     Upper Body Dressing/Undressing Upper body dressing   What is the patient wearing?: Pull over shirt    Upper body assist Assist Level: Supervision/Verbal cueing    Lower Body Dressing/Undressing Lower body dressing      What is the patient wearing?: Pants     Lower body assist Assist for lower body dressing: Moderate Assistance - Patient 50 - 74%     Toileting Toileting    Toileting assist Assist for toileting: Minimal Assistance - Patient > 75% Assistive Device Comment: urinal   Transfers Chair/bed transfer  Transfers assist     Chair/bed transfer assist level: Supervision/Verbal cueing     Locomotion Ambulation   Ambulation assist      Assist level: Minimal Assistance - Patient > 75% Assistive device: Walker-rolling Max distance: 25   Walk 10 feet activity   Assist     Assist level: Minimal Assistance - Patient >  75% Assistive device: Walker-rolling, Orthosis   Walk 50 feet activity   Assist Walk 50 feet with 2 turns activity did not occur: Safety/medical concerns         Walk 150 feet activity   Assist Walk 150 feet activity did not occur: Safety/medical concerns         Walk 10 feet on uneven surface  activity   Assist Walk 10 feet on uneven surfaces  activity did not occur: Safety/medical concerns         Wheelchair     Assist Will patient use wheelchair at discharge?: Yes Type of Wheelchair: Manual    Wheelchair assist level: Supervision/Verbal cueing Max wheelchair distance: 150    Wheelchair 50 feet with 2 turns activity    Assist        Assist Level: Supervision/Verbal cueing   Wheelchair 150 feet activity     Assist      Assist Level: Supervision/Verbal cueing   Blood pressure 120/82, pulse 82, temperature 97.9 F (36.6 C), resp. rate 18, weight 94.3 kg, SpO2 100 %.  Medical Problem List and Plan: 1.  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             -ELOS 12/22            CIR Level PT , OT , SLP -    Per therapy exercise tolerance has worsened after manipulation. need to extend the stay    -continue CPM 2.  Antithrombotics: -DVT/anticoagulation:  Pharmaceutical: lovenox 40mg  daily  doppler neg for DVT              -antiplatelet therapy: ASA 3. Pain Management: tylenol for mild and oxycodone for severe pain.              Monitor with increased mobility Doesn't like how pain meds make him feel   12/8: For right knee pain, will add diclofenac gel. For left shoulder pain, will add Lidocaine patch, will ask Ortho to eval effusion, may need arthrocentesis will contact4. Mood: LCSW to follow for support and evaluation. Team to provide ego support             -antipsychotic agents: N/A.  5. Neuropsych: This patient is capable of making decisions on his own behalf. 6. Skin/Wound Care: routine pressure relief measures., abd wound healing  Miliarsia- likely from heat related to overlay, changed benadryl to calamine  -remove sutures right hip 7. Fluids/Electrolytes/Nutrition: Monitor I/O. Encourage intake--he's afraid to eat due to concerns of return of abdominal pain.off TF     12/7 Na 131,129 on 12/8 repeat 12/14  8. Right pontine  stroke: On ASA for secondary stroke prevention.  9. Mesenteric injuries s/p Ileocecectomy, total colectomy and repair of lacerations: Foley to collect high volume out of ileostomy."liquidy stools expected given lack of lg colon  Lomotil prn  -ostomy sealed 10. Fistula/leak/abscess: completed  Zosyn 11/15-->11/29. 12. Situational depression with insomnia: Continue Seroquel at nights.   13. Right knee pain: Question MRI for workup.  14. ORIF left clavicle and left distal radius fracture: NWB left wrist--may WB thorough elbow with platform walker. 15.  IM nailing right femur and ORIF right bimalleolar Fx: .  16. Anemia:likely ABLA  11/30 CBC stable, improved to 10.6 on 12/7 17.  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 He has CPM ordered from 0 to 90, was  ranged under GA to 125 deg, patient increased rapidly on the right side and has soreness related to this.  On the left side he went up to 115 degrees.  He has goals of 125 degrees.  Recommend that he increase his by no more than 5 degrees/day on the CPM, does not tolerate well on right side left side at 100deg , did not get in CPM yesterday per pt report  .  Continue aggressive rehab efforts  18.  Left MCP, PIP and DIP contracture- extention, will ask OT to perform passive flexion of fingers   19.  Papular rash upper chest and thighs, miliaria-  calamine working well    20.  Dysphagia post stroke, d/w SLP should have normal sensation due to pontine location, may have anxiety component   LOS: 8 days A FACE TO FACE EVALUATION WAS PERFORMED  Meredith Staggers 09/18/2019, 12:54 PM

## 2019-09-19 ENCOUNTER — Inpatient Hospital Stay (HOSPITAL_COMMUNITY): Payer: 59 | Admitting: Occupational Therapy

## 2019-09-19 NOTE — Progress Notes (Signed)
[place] PHYSICAL MEDICINE & REHABILITATION PROGRESS NOTE   Subjective/Complaints:  Had issues with ostomy dressing, apparently new seal was used.   ROS: Patient denies fever, rash, sore throat, blurred vision, nausea, vomiting, diarrhea, cough, shortness of breath or chest pain, joint or back pain, headache, or mood change.    Objective:   No results found. No results for input(s): WBC, HGB, HCT, PLT in the last 72 hours. No results for input(s): NA, K, CL, CO2, GLUCOSE, BUN, CREATININE, CALCIUM in the last 72 hours.  Intake/Output Summary (Last 24 hours) at 09/19/2019 1122 Last data filed at 09/19/2019 0925 Gross per 24 hour  Intake 780 ml  Output 1545 ml  Net -765 ml     Physical Exam: Vital Signs Blood pressure 125/80, pulse 70, temperature 97.9 F (36.6 C), temperature source Oral, resp. rate 16, weight 93 kg, SpO2 99 %. Constitutional: No distress . Vital signs reviewed. HEENT: EOMI, oral membranes moist Neck: supple Cardiovascular: RRR without murmur. No JVD    Respiratory: CTA Bilaterally without wheezes or rales. Normal effort    GI: BS +, non-tender, non-distended. Ostomy intact. Granulating abdominal wound Musculoskeletal:     Comments:   Knee ROM around 90 deg+ + right  knee tenderness, no effusion  + left shoulder tenderness. Incision C/D/I   Neurological: He is alert and oriented to person, place, and time.  Motor: Right upper extremity: 4 -/5 proximal distal Left upper extremity: Shoulder abduction, elbow flexion/extension 3- /5, handgrip trace  right lower extremity is in the CPM and cannot be tested. Left lower extremity has 3 - at the hip flexors knee extensors 4 - and ankle dorsiflexors 3 - motor exam stable Sensation intact light touch Dysarthric Left facial weakness ongoing Skin: sutures right hip  Psychiatric: Flat no lability or agitation   Assessment/Plan: 1. Functional deficits secondary to acute brainstem infarct which require 3+ hours  per day of interdisciplinary therapy in a comprehensive inpatient rehab setting.  Physiatrist is providing close team supervision and 24 hour management of active medical problems listed below.  Physiatrist and rehab team continue to assess barriers to discharge/monitor patient progress toward functional and medical goals  Care Tool:  Bathing    Body parts bathed by patient: Left arm, Chest, Abdomen, Face, Right upper leg, Left upper leg, Right arm, Front perineal area, Buttocks   Body parts bathed by helper: Right lower leg, Left lower leg     Bathing assist Assist Level: Minimal Assistance - Patient > 75%     Upper Body Dressing/Undressing Upper body dressing   What is the patient wearing?: Pull over shirt    Upper body assist Assist Level: Minimal Assistance - Patient > 75%    Lower Body Dressing/Undressing Lower body dressing      What is the patient wearing?: Incontinence brief, Underwear/pull up     Lower body assist Assist for lower body dressing: Moderate Assistance - Patient 50 - 74%     Toileting Toileting    Toileting assist Assist for toileting: Minimal Assistance - Patient > 75% Assistive Device Comment: urinal   Transfers Chair/bed transfer  Transfers assist     Chair/bed transfer assist level: Supervision/Verbal cueing     Locomotion Ambulation   Ambulation assist      Assist level: Minimal Assistance - Patient > 75% Assistive device: Walker-rolling Max distance: 25   Walk 10 feet activity   Assist     Assist level: Minimal Assistance - Patient > 75% Assistive device: Walker-rolling,  Orthosis   Walk 50 feet activity   Assist Walk 50 feet with 2 turns activity did not occur: Safety/medical concerns         Walk 150 feet activity   Assist Walk 150 feet activity did not occur: Safety/medical concerns         Walk 10 feet on uneven surface  activity   Assist Walk 10 feet on uneven surfaces activity did not occur:  Safety/medical concerns         Wheelchair     Assist Will patient use wheelchair at discharge?: Yes Type of Wheelchair: Manual    Wheelchair assist level: Supervision/Verbal cueing Max wheelchair distance: 150    Wheelchair 50 feet with 2 turns activity    Assist        Assist Level: Supervision/Verbal cueing   Wheelchair 150 feet activity     Assist      Assist Level: Supervision/Verbal cueing   Blood pressure 125/80, pulse 70, temperature 97.9 F (36.6 C), temperature source Oral, resp. rate 16, weight 93 kg, SpO2 99 %.  Medical Problem List and Plan: 1.  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             -ELOS 12/22            CIR Level PT , OT , SLP -    Per therapy exercise tolerance has worsened after manipulation. Extend LOS?  -continue CPM 2.  Antithrombotics: -DVT/anticoagulation:  Pharmaceutical: lovenox 40mg  daily  doppler neg for DVT              -antiplatelet therapy: ASA 3. Pain Management: tylenol for mild and oxycodone for severe pain.              Monitor with increased mobility Doesn't like how pain meds make him feel   12/8: For right knee pain, will add diclofenac gel. For left shoulder pain, will add Lidocaine patch, will ask Ortho to eval effusion, may need arthrocentesis will contact4. Mood: LCSW to follow for support and evaluation. Team to provide ego support             -antipsychotic agents: N/A.  5. Neuropsych: This patient is capable of making decisions on his own behalf. 6. Skin/Wound Care: routine pressure relief measures., abd wound healing  Miliarsia- likely from heat related to overlay, changed benadryl to calamine  -remove sutures right hip 7. Fluids/Electrolytes/Nutrition: Monitor I/O. Encourage intake--he's afraid to eat due to concerns of return of abdominal pain.off TF     12/7 Na 131,129 on 12/8 repeat 12/14  8. Right pontine stroke: On ASA  for secondary stroke prevention.  9. Mesenteric injuries s/p Ileocecectomy, total colectomy and repair of lacerations: Foley to collect high volume out of ileostomy."liquidy stools expected given lack of lg colon  Lomotil prn  12/13 -asked pt to d/w RN re: ostomy to make sure they are using the type of seal,bag which works for him 10. Fistula/leak/abscess: completed  Zosyn 11/15-->11/29. 12. Situational depression with insomnia: Continue Seroquel at nights.   13. Right knee pain: Question MRI for workup.  14. ORIF left clavicle and left distal radius fracture: NWB left wrist--may WB thorough elbow with platform walker. 15.  IM nailing right femur and ORIF right bimalleolar Fx: .  16. Anemia:likely ABLA  11/30 CBC stable, improved to 10.6 on 12/7 17.  Bilateral knee contracture able to achieve near full extension , flexion is limited R>L status  post manipulation under general anesthesia. He has CPM ordered from 0 to 90, was ranged under GA to 125 deg, patient increased rapidly on the right side and has soreness related to this.  On the left side he went up to 115 degrees.  He has goals of 125 degrees.  Recommend that he increase his by no more than 5 degrees/day on the CPM, does not tolerate well on right side left side at 100deg   .  Continue aggressive rehab efforts  18.  Left MCP, PIP and DIP contracture- extention, will ask OT to perform passive flexion of fingers   19.  Papular rash upper chest and thighs, miliaria-  calamine working well    20.  Dysphagia post stroke, d/w SLP should have normal sensation due to pontine location, may have anxiety component   LOS: 9 days A FACE TO FACE EVALUATION WAS PERFORMED  Meredith Staggers 09/19/2019, 11:22 AM

## 2019-09-19 NOTE — Progress Notes (Signed)
Occupational Therapy Session Note  Patient Details  Name: CURTEZ HUBLEY MRN: [id number] Date of Birth: [birth year]  Today's Date: 09/19/2019 OT Individual Time: [id number] OT Individual Time Calculation (min): 59 min   Short Term Goals: Week 2:  OT Short Term Goal 1 (Week 2): STGs=LTGs due to ELOS  Skilled Therapeutic Interventions/Progress Updates:    Pt greeted in bed, just finishing breakfast and requesting to get washed up. Supervision for supine<sit and steady assist for squat pivot<w/c. Had pt direct care for setup of w/c transfer. While at the sink, pt engaged in UB/LB bathing/dressing sit<stand. Pt able to complete UB and oral care/grooming tasks with either supervision using one-handed techniques or HOH for incorporating L UE as shoulder pain allowed. Noted Lt scapular winging as well. Pt required assist to wash feet and to don footwear due to decreased LE flexibility bilaterally. Min A for sit<stand with vcs for hand placement. Min A for dynamic standing balance while pt performed pericare and elevated boxers over hips. Pt able to use reacher to thread LEs into pants and also to doff his gripper socks. OT donned socks and shoes with B AFO components. Pt then ambulated to the bathroom and transferred to toilet with Min A using RW. While sitting, pt began groaning and c/o actively having BM in brief. Pt did have liquid BM in brief and required A for hygiene due to increased feelings of anxiousness, nausea, and feeling "hot" temperature-wise. He ambulated back to bed and returned to supine once shoes were removed by therapist. He also reported feeling dizzy while having a BM. He reported after transferring to bed these symptoms absolved. Pt also visibly calmer at this time. Left him with all needs within reach and LEs elevated. RN made aware of BM and pts symptoms.    Therapy Documentation Precautions:  Precautions Precaution Comments: colostomy, L UE WBAT (no longer needs splint), R LE WBAT  (no longer needs CAM boot) as of 12/4 Restrictions Weight Bearing Restrictions: Yes LUE Weight Bearing: Weight bearing as tolerated RLE Weight Bearing: Weight bearing as tolerated Vital Signs: Therapy Vitals Temp: 98.6 F (37 C) Temp Source: Oral Pulse Rate: 89 Resp: 18 BP: 113/82 Patient Position (if appropriate): Lying Oxygen Therapy SpO2: 100 % O2 Device: Room Air Pain: Pt medicated for pain at start of session   ADL:      Therapy/Group: Individual Therapy  Emelina Hinch A Shuaib Corsino 09/19/2019, 4:19 PM

## 2019-09-20 ENCOUNTER — Inpatient Hospital Stay (HOSPITAL_COMMUNITY): Payer: 59

## 2019-09-20 ENCOUNTER — Inpatient Hospital Stay (HOSPITAL_COMMUNITY): Payer: 59 | Admitting: Occupational Therapy

## 2019-09-20 ENCOUNTER — Inpatient Hospital Stay (HOSPITAL_COMMUNITY): Payer: 59 | Admitting: Physical Therapy

## 2019-09-20 LAB — CBC WITH DIFFERENTIAL/PLATELET
Abs Immature Granulocytes: 0.11 10*3/uL — ABNORMAL HIGH (ref 0.00–0.07)
Basophils Absolute: 0.1 10*3/uL (ref 0.0–0.1)
Basophils Relative: 1 %
Eosinophils Absolute: 0.2 10*3/uL (ref 0.0–0.5)
Eosinophils Relative: 3 %
HCT: 30.9 % — ABNORMAL LOW (ref 39.0–52.0)
Hemoglobin: 10.1 g/dL — ABNORMAL LOW (ref 13.0–17.0)
Immature Granulocytes: 2 %
Lymphocytes Relative: 18 %
Lymphs Abs: 1.1 10*3/uL (ref 0.7–4.0)
MCH: 31.5 pg (ref 26.0–34.0)
MCHC: 32.7 g/dL (ref 30.0–36.0)
MCV: 96.3 fL (ref 80.0–100.0)
Monocytes Absolute: 0.7 10*3/uL (ref 0.1–1.0)
Monocytes Relative: 12 %
Neutro Abs: 4.1 10*3/uL (ref 1.7–7.7)
Neutrophils Relative %: 64 %
Platelets: 704 10*3/uL — ABNORMAL HIGH (ref 150–400)
RBC: 3.21 MIL/uL — ABNORMAL LOW (ref 4.22–5.81)
RDW: 14.7 % (ref 11.5–15.5)
WBC: 6.2 10*3/uL (ref 4.0–10.5)
nRBC: 0 % (ref 0.0–0.2)

## 2019-09-20 LAB — BASIC METABOLIC PANEL
Anion gap: 10 (ref 5–15)
BUN: 24 mg/dL — ABNORMAL HIGH (ref 6–20)
CO2: 24 mmol/L (ref 22–32)
Calcium: 10.1 mg/dL (ref 8.9–10.3)
Chloride: 97 mmol/L — ABNORMAL LOW (ref 98–111)
Creatinine, Ser: 0.82 mg/dL (ref 0.61–1.24)
GFR calc Af Amer: >60 mL/min (ref >60)
GFR calc non Af Amer: >60 mL/min (ref >60)
Glucose, Bld: 107 mg/dL — ABNORMAL HIGH (ref 70–99)
Potassium: 3.9 mmol/L (ref 3.5–5.1)
Sodium: 131 mmol/L — ABNORMAL LOW (ref 135–145)

## 2019-09-20 NOTE — Progress Notes (Addendum)
Speech Language Pathology Daily Session Note  Patient Details  Name: DIEM PAGNOTTA MRN: [id number] Date of Birth: 07-05-1961  Today's Date: 09/20/2019 SLP Individual Time: 4680-3212 and 830-900 SLP Individual Time Calculation (min): 29 min and 30 min  Short Term Goals: Week 1: SLP Short Term Goal 1 (Week 1): Pt will consume dys 3 textures and thin liquid diet with mod I use of swallow strategies and no overt s/s aspiration. SLP Short Term Goal 2 (Week 1): Pt will consume regular texture trials with appropriate mastication and oral clearnce in x3 sessions prior to soild upgrade with no overt s/s aspiration. SLP Short Term Goal 3 (Week 1): Pt will paricipate in assessment of higher level cognitive and executive function skills. SLP Short Term Goal 4 (Week 1): Pt will complete 3 sets of 10 RMT exercises to increase respiratory support for speech and swallowing. SLP Short Term Goal 4 - Progress (Week 1): Progressing toward goal SLP Short Term Goal 5 (Week 1): Pt will consume current diet with minimal overt s/s of aspiration or dysphagia. SLP Short Term Goal 5 - Progress (Week 1): Progressing toward goal SLP Short Term Goal 6 - Progress (Week 1): Met  Skilled Therapeutic Interventions:  #1 Skilled ST services focused on swallow and speech skills. SLP facilitated speech intelligibility strategies in mildly noisy environment (tv and air matress on), pt required supervision A verbal cues to increase vocal intensity for 85% intelligibility. SLP assisted pt in selecting dys 3 lunch tray for tomorrows session. Pt demonstrated ability to preform x2 masako exercises out of 5 trials, continued difficulty initating swallow. Pt consumed cup sips thin x5 with no overt s/s aspiration. Pt performed EMST set at 45 cm H2O 10 repetitions x 2 and IMST set at 37cm H20 10 repetitions x1, with self-perceived effort score of 6/10. Pt was left in room with call bell within reach and chair alarm set. ST recommends to  continue skilled ST services.   #2 Skilled ST services focused on swallow and speech skills. SLP facilitated PO consumption dys 3 snack trial, demonstrating effortful swallow and liquid wash mod I, however did demonstrate delayed cough expressing sensed pharyngeal and oral residue. SLP provided oral care set up in which pt completed and demonstrated no further s/s aspiration following oral care. Pt completed x2 masako exercise for base of tongue strength. Pt performed EMST set at 45 cm H2O 10 repetitions x 1 and IMST set at 37cm H20 10 repetitions x2, with self-perceived effort score of 7/10. Pt was left in room with call bell within reach and bed alarm set. ST recommends to continue skilled ST services.      Pain Pain Assessment Pain Score: 0-No pain  Therapy/Group: Individual Therapy  Kashmir Lysaght  [facility] 09/20/2019, 12:40 PM

## 2019-09-20 NOTE — Progress Notes (Signed)
Physical Therapy Session Note  Patient Details  Name: [name] MRN: [id number] Date of Birth: 03-28-1961  Today's Date: 09/20/2019 PT Individual Time: 1305-1420 PT Individual Time Calculation (min): 75 min   Short Term Goals: Week 3:  PT Short Term Goal 1 (Week 3): Pt will consistently perform sit<>stand transfers with CGA from WC height of 21 inches. PT Short Term Goal 2 (Week 3): Pt will ambulate >68f with min assist and LRAD PT Short Term Goal 3 (Week 3): [facility] PT Short Term Goal 4 (Week 3): Pt will initiate stair management training with mod assist for 1, 3inch step.  Skilled Therapeutic Interventions/Progress Updates: Pt presented in w/c agreeable to therapy. Pt stating some pain in R knee 4/10 but manageable. Pt stating may need to use bathroom prior to leaving room. Transported w/c to bathroom and performed stand pivot to toilet with use of wall rail to toilet with PTA providing modA for clothing management (+ void). Once completed performed stand pivot back to w/c in same manner as prior. Pt the transported to day room and performed squat pivot to NuStep. Participated in NuStep L2 x 5 min for B knee ROM and general strengthening, pt returned to w/c in same manner as prior. Pt then transported to high/low mat and performed STS x 5 from approx 21in with minA. Pt then participated in alternating toe taps to 4 in step x 5 bilaterally. Pt limited by decreased endurance in all activities. Pt then participated in gait training approx 238fx 3 with RW and step to progressing to step through pattern. Pt required increased time between bouts due to fatigue. PTA provided continuing edu on why pt deconditioned and provided emotional support. Pt then transported to rehab gym and performed ascending /descending x 4 3in step. Pt was able to perform with minA. Pt then performed R knee flexion stretch at 3in step 20 sec x 3 with max encouragement. Pt  transported back to room and performed squat pivot transfer to bed supervision. Pt was able to manage BLE with shoes and AFOs onto bed. Pt repositioned to comfort and left with call bell within reach and current needs met.      Therapy Documentation Precautions:  Precautions Precaution Comments: colostomy, L UE WBAT (no longer needs splint), R LE WBAT (no longer needs CAM boot) as of 12/4 Restrictions Weight Bearing Restrictions: Yes LUE Weight Bearing: Weight bear through elbow only RLE Weight Bearing: Weight bearing as tolerated General:   Vital Signs: Therapy Vitals Temp: 97.6 F (36.4 C) Temp Source: Oral Pulse Rate: 99 Resp: 18 BP: 116/88 Patient Position (if appropriate): Sitting Oxygen Therapy SpO2: 100 % O2 Device: Room Air Pain: Pain Assessment Pain Score: 0-No pain   Therapy/Group: Individual Therapy  Chika Cichowski  Katrin Grabel, PTA  09/20/2019, 3:56 PM

## 2019-09-20 NOTE — Progress Notes (Signed)
Occupational Therapy Weekly Progress Note  Patient Details  Name: [name] MRN: [id number] Date of Birth: 1961/07/19  Beginning of progress report period: 09/13/2019 End of progress report period: 09/20/2019  Today's Date: 09/20/2019 OT Individual Time: 1100-1158 OT Individual Time Calculation (min): 58 min   Patient has not yet met his LTGs. His ELOS has been extended s/p bilateral knee manipulation.   Patient has made slow progress at time of report and continues to be limited by pain, however pt is better able to tolerate functional standing and functional ambulation using RW. He presently requires Min Kenneth for ambulatory transfers using RW while wearing bilateral AFOs. Will continue d/c planning in prep for d/c home on 12/22.         Patient continues to demonstrate the following deficits: muscle weakness and pain, decreased cardiorespiratoy endurance and decreased standing balance and decreased balance strategies and therefore will continue to benefit from skilled OT intervention to enhance overall performance with BADL.  Patient progressing toward long term goals..  Continue plan of care.  OT Short Term Goals Week 2:  OT Short Term Goal 1 (Week 2): STGs=LTGs due to ELOS OT Short Term Goal 1 - Progress (Week 2): Progressing toward goal Week 3:  OT Short Term Goal 1 (Week 3): STGs=LTGs due to ELOS  Skilled Therapeutic Interventions/Progress Updates:    Pt greeted in bed and premedicated for pain. He still rates Rt knee pain as 7/10 at rest. More movement noted in his Lt hand today with pt reporting he's been using it more functionally. Supine<sit completed with supervision assist. He used the reacher to thread LEs into pants and then stood with Min Kenneth to pull them over hips with RW for support. While he returned to sitting, OT demonstrated use of sock aide with pt able to don both gripper socks using device while incorporating both hands. He did so with supervision assist! Pt using the  reacher to doff gripper socks also. He needed assist to don both shoes with AFO components. Min Kenneth for short distance ambulatory transfer to the w/c using RW. Pt self propelled w/c ~___ ft in hallway using B UEs with min vcs for turning and more efficient technique. He did well with symmetrical upper limb movement. While in the therapy apartment, had pt side-step using RW in front of countertop while washing counter with the Lt hand. After about Kenneth minute of activity, pt reported feeling "hot" temperature-wise and requested to sit back down. In sitting BP 115/95, which pt reports is close to his normal (120s/80s). Pt felt well enough to complete Kenneth short distance ambulatory transfer to the couch using device (Min Kenneth). Heavy Mod Kenneth to power up into standing from couch with vcs. We discussed functional implications of this at home. Afterwards escorted pt back to room. He opted to remain up in w/c, feeling better overall but still hot. Left him with all needs and safety belt fastened. RN and NT made aware of pts symptoms to further assess vitals if necessary.      Therapy Documentation Precautions:  Precautions Precaution Comments: colostomy, L UE WBAT (no longer needs splint), R LE WBAT (no longer needs CAM boot) as of 12/4 Restrictions Weight Bearing Restrictions: Yes LUE Weight Bearing: Weight bear through elbow only RLE Weight Bearing: Weight bearing as tolerated ADL:       Therapy/Group: Individual Therapy  [name] [name] 09/20/2019, 12:25 PM

## 2019-09-20 NOTE — Progress Notes (Signed)
Occupational Therapy Session Note  Patient Details  Name: [name] MRN: [id number] Date of Birth: 1961/04/21  Today's Date: 09/20/2019 OT Individual Time: 1531-1557 OT Individual Time Calculation (min): 26 min    Short Term Goals: Week 2:  OT Short Term Goal 1 (Week 2): STGs=LTGs due to ELOS OT Short Term Goal 1 - Progress (Week 2): Progressing toward goal  Skilled Therapeutic Interventions/Progress Updates:    Upon entering the room, pt supine in bed awaiting OT arrival. Pt with no c/o pain but needing encouragement this session for ambulation. Pt requesting to go to bathroom and initially refusing to ambulate. Pt needing set up A for transfer into wheelchair with supervision for safety. OT assisted pt into bathroom via wheelchair. Pt able to hold onto grab bar with L UE and standing with min A. Pt managing LB clothing while standing with min guard. Pt sitting on commode and able to void and returning to wheelchair in same manner. Hand hygiene at sink with supervision. Pt ambulating back to bed with RW and min A for safety. Pt seated on EOB while therapist assisted pt with doffing B shoes and AFOs. Sit >supine with min guard into bed. Call bell and all needed items within reach upon exiting the room.   Therapy Documentation Precautions:  Precautions Precaution Comments: colostomy, L UE WBAT (no longer needs splint), R LE WBAT (no longer needs CAM boot) as of 12/4 Restrictions Weight Bearing Restrictions: Yes LUE Weight Bearing: Weight bear through elbow only RLE Weight Bearing: Weight bearing as tolerated Vital Signs: Therapy Vitals Temp: 97.6 F (36.4 C) Temp Source: Oral Pulse Rate: 99 Resp: 18 BP: 116/88 Patient Position (if appropriate): Sitting Oxygen Therapy SpO2: 100 % O2 Device: Room Air Pain: Pain Assessment Pain Score: 0-No pain   Therapy/Group: Individual Therapy  Gypsy Decant 09/20/2019, 4:09 PM

## 2019-09-20 NOTE — Progress Notes (Signed)
[place] PHYSICAL MEDICINE & REHABILITATION PROGRESS NOTE   Subjective/Complaints:  No issues overnite , tolerating CPM- Knee flexion  Up to 115 deg on L 100deg on R  Wife was able to transfer pt from [facility] to commode yesterday  Wife changed the colostomy bag   ROS: Patient denies , nausea, vomiting, diarrhea, cough, shortness of breath or chest pain, joint or back pain,    Objective:   No results found. Recent Labs    09/20/19 0722  WBC 6.2  HGB 10.1*  HCT 30.9*  PLT 704*   Recent Labs    09/20/19 0722  NA 131*  K 3.9  CL 97*  CO2 24  GLUCOSE 107*  BUN 24*  CREATININE 0.82  CALCIUM 10.1    Intake/Output Summary (Last 24 hours) at 09/20/2019 0942 Last data filed at 09/20/2019 0730 Gross per 24 hour  Intake 900 ml  Output 925 ml  Net -25 ml     Physical Exam: Vital Signs Blood pressure 109/83, pulse 79, temperature 98.4 F (36.9 C), temperature source Oral, resp. rate 18, weight 91.6 kg, SpO2 98 %. Constitutional: No distress . Vital signs reviewed. HEENT: EOMI, oral membranes moist Neck: supple Cardiovascular: RRR without murmur. No JVD    Respiratory: CTA Bilaterally without wheezes or rales. Normal effort    GI: BS +, non-tender, non-distended. Ostomy intact. Granulating abdominal wound Musculoskeletal:     Comments:   Knee ROM around 90 deg+ + right  knee tenderness, no effusion  + left shoulder tenderness. Incision C/D/I   Neurological: He is alert and oriented to person, place, and time.  Motor: Right upper extremity: 4 -/5 proximal distal Left upper extremity: Shoulder abduction, elbow flexion/extension 3- /5, handgrip trace  right lower extremity is in the CPM and cannot be tested. Left lower extremity has 3 - at the hip flexors knee extensors 4 - and ankle dorsiflexors 3 - motor exam stable Sensation intact light touch Dysarthric Left facial weakness ongoing Skin: sutures right hip  Psychiatric: Flat no lability or  agitation   Assessment/Plan: 1. Functional deficits secondary to acute brainstem infarct which require 3+ hours per day of interdisciplinary therapy in a comprehensive inpatient rehab setting.  Physiatrist is providing close team supervision and 24 hour management of active medical problems listed below.  Physiatrist and rehab team continue to assess barriers to discharge/monitor patient progress toward functional and medical goals  Care Tool:  Bathing    Body parts bathed by patient: Left arm, Chest, Abdomen, Face, Right upper leg, Left upper leg, Right arm, Front perineal area, Buttocks   Body parts bathed by helper: Right lower leg, Left lower leg     Bathing assist Assist Level: Minimal Assistance - Patient > 75%     Upper Body Dressing/Undressing Upper body dressing   What is the patient wearing?: Pull over shirt    Upper body assist Assist Level: Minimal Assistance - Patient > 75%    Lower Body Dressing/Undressing Lower body dressing      What is the patient wearing?: Incontinence brief, Underwear/pull up     Lower body assist Assist for lower body dressing: Moderate Assistance - Patient 50 - 74%     Toileting Toileting    Toileting assist Assist for toileting: Minimal Assistance - Patient > 75% Assistive Device Comment: urinal   Transfers Chair/bed transfer  Transfers assist     Chair/bed transfer assist level: Supervision/Verbal cueing     Locomotion Ambulation   Ambulation assist  Assist level: Minimal Assistance - Patient > 75% Assistive device: Walker-rolling Max distance: 25   Walk 10 feet activity   Assist     Assist level: Minimal Assistance - Patient > 75% Assistive device: Walker-rolling, Orthosis   Walk 50 feet activity   Assist Walk 50 feet with 2 turns activity did not occur: Safety/medical concerns         Walk 150 feet activity   Assist Walk 150 feet activity did not occur: Safety/medical concerns          Walk 10 feet on uneven surface  activity   Assist Walk 10 feet on uneven surfaces activity did not occur: Safety/medical concerns         Wheelchair     Assist Will patient use wheelchair at discharge?: Yes Type of Wheelchair: Manual    Wheelchair assist level: Supervision/Verbal cueing Max wheelchair distance: 150    Wheelchair 50 feet with 2 turns activity    Assist        Assist Level: Supervision/Verbal cueing   Wheelchair 150 feet activity     Assist      Assist Level: Supervision/Verbal cueing   Blood pressure 109/83, pulse 79, temperature 98.4 F (36.9 C), temperature source Oral, resp. rate 18, weight 91.6 kg, SpO2 98 %.  Medical Problem List and Plan: 1.  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             -ELOS 12/22            CIR Level PT , OT , SLP -    Per therapy exercise tolerance has worsened after manipulation. Extend LOS?  -continue CPM 2.  Antithrombotics: -DVT/anticoagulation:  Pharmaceutical: lovenox 40mg  daily  doppler neg for DVT              -antiplatelet therapy: ASA 3. Pain Management: tylenol for mild and oxycodone for severe pain.              Monitor with increased mobility Doesn't like how pain meds make him feel   12/8: For right knee pain, will add diclofenac gel. For left shoulder pain, will add Lidocaine patch, will ask Ortho to eval effusion, may need arthrocentesis will contact4. Mood: LCSW to follow for support and evaluation. Team to provide ego support             -antipsychotic agents: N/A.  5. Neuropsych: This patient is capable of making decisions on his own behalf. 6. Skin/Wound Care: routine pressure relief measures., abd wound healing  Miliarsia- likely from heat related to overlay, changed benadryl to calamine  -remove sutures right hip 7. Fluids/Electrolytes/Nutrition: Monitor I/O. Encourage intake--he's afraid to eat due to  concerns of return of abdominal pain.off TF     12/7 Na 131,129 on 12/8 repeat 12/14  8. Right pontine stroke: On ASA for secondary stroke prevention.  9. Mesenteric injuries s/p Ileocecectomy, total colectomy and repair of lacerations: Foley to collect high volume out of ileostomy."liquidy stools expected given lack of lg colon  Lomotil prn  12/13 -asked pt to d/w RN re: ostomy to make sure they are using the type of seal,bag which works for him 10. Fistula/leak/abscess: completed  Zosyn 11/15-->11/29. 12. Situational depression with insomnia: Continue Seroquel at nights.   13. Right knee pain: Question MRI for workup.  14. ORIF left clavicle and left distal radius fracture: NWB left wrist--may WB thorough elbow with platform walker. 15.  IM nailing  right femur and ORIF right bimalleolar Fx: .  16. Anemia:likely ABLA  11/30 CBC stable, improved to 10.6 on 12/7 17.  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 He has CPM ordered from 0 to 90, was ranged under GA to 125 deg, patient increased rapidly on the right side and has soreness related to this.  On the left side he went up to 115 degrees.  He has goals of 125 degrees.  Cont CPM slowly advancing to goals  .  Continue aggressive rehab efforts  18.  Left MCP, PIP and DIP contracture- extention, will ask OT to perform passive flexion of fingers   19.  Papular rash upper chest and thighs, miliaria-  calamine working well    20.  Dysphagia post stroke, d/w SLP should have normal sensation due to pontine location, may have anxiety component   LOS: 10 days A FACE TO FACE EVALUATION WAS PERFORMED  Charlett Blake 09/20/2019, 9:42 AM

## 2019-09-21 ENCOUNTER — Inpatient Hospital Stay (HOSPITAL_COMMUNITY): Payer: 59 | Admitting: Physical Therapy

## 2019-09-21 ENCOUNTER — Inpatient Hospital Stay (HOSPITAL_COMMUNITY): Payer: 59 | Admitting: Occupational Therapy

## 2019-09-21 ENCOUNTER — Inpatient Hospital Stay (HOSPITAL_COMMUNITY): Payer: 59

## 2019-09-21 NOTE — Progress Notes (Signed)
Physical Therapy Session Note  Patient Details  Name: [name] MRN: [id number] Date of Birth: 1960-10-19  Today's Date: 09/21/2019 PT Individual Time: 1345-1430 PT Individual Time Calculation (min): 45 min   Short Term Goals: Week 3:  PT Short Term Goal 1 (Week 3): Pt will consistently perform sit<>stand transfers with CGA from WC height of 21 inches. PT Short Term Goal 2 (Week 3): Pt will ambulate >51f with min assist and LRAD PT Short Term Goal 3 (Week 3): [facility] and LRAD consistently PT Short Term Goal 4 (Week 3): Pt will initiate stair management training with mod assist for 1, 3inch step.  Skilled Therapeutic Interventions/Progress Updates: Pt presented in bed agreeable to therapy. Pt stating some pain in R knee but manageable and does not require intervention. Pt performed bed mobility with supervision and use of bed features. Performed squat pivot to w/c supervision A. Pt transported to day room for time management and performed squat pivot to NuStep. Participated in NuStep L2 x 5 min for R knee ROM and general conditioning. Pt then ambulated approx 20 ft to high/low mat and participated STS from 21 in mat CGA fading to supervision, then from 20 in mat CGA.  Pt then ambulated 335fx 2 with minA close to CGGrandPt continues to demonstrate limited endurance due to fatigue. Pt transported back to room at end of session and agreeable to remain in w/c until next session (approx 30 min). Pt left with call bell within reach and needs met.      Therapy Documentation Precautions:  Precautions Precaution Comments: colostomy, L UE WBAT (no longer needs splint), R LE WBAT (no longer needs CAM boot) as of 12/4 Restrictions Weight Bearing Restrictions: Yes LUE Weight Bearing: Weight bear through elbow only RLE Weight Bearing: Weight bearing as tolerated General:   Vital Signs: Therapy Vitals Temp: (!) 97.5 F (36.4 C) Temp Source: Oral Pulse Rate: 83 Resp:  19 BP: 104/83 Patient Position (if appropriate): Lying Oxygen Therapy SpO2: 100 % O2 Device: Room Air Pain: Pain Assessment Pain Score: 0-No pain   Therapy/Group: Individual Therapy  [name]  [name], PTA  09/21/2019, 3:42 PM

## 2019-09-21 NOTE — Plan of Care (Signed)
  Problem: Consults Goal: RH STROKE PATIENT EDUCATION Description: See Patient Education module for education specifics  09/21/2019 0244 by Sandrea Hammond, LPN Outcome: Progressing 09/21/2019 0226 by Sandrea Hammond, LPN Outcome: Progressing   Problem: RH BOWEL ELIMINATION Goal: RH STG MANAGE BOWEL WITH ASSISTANCE Description: STG Manage Bowel with mod Assistance. 09/21/2019 0244 by Sandrea Hammond, LPN Outcome: Progressing 09/21/2019 0226 by Sandrea Hammond, LPN Outcome: Progressing Goal: RH STG MANAGE BOWEL W/MEDICATION W/ASSISTANCE Description: STG Manage Bowel with Medication with mod I Assistance. 09/21/2019 0244 by Sandrea Hammond, LPN Outcome: Progressing 09/21/2019 0226 by Sandrea Hammond, LPN Outcome: Progressing   Problem: RH BLADDER ELIMINATION Goal: RH STG MANAGE BLADDER WITH ASSISTANCE Description: STG Manage Bladder With min Assistance 09/21/2019 0244 by Sandrea Hammond, LPN Outcome: Progressing 09/21/2019 0226 by Sandrea Hammond, LPN Outcome: Progressing   Problem: RH SKIN INTEGRITY Goal: RH STG SKIN FREE OF INFECTION/BREAKDOWN Description: Patients skin will remain free from further infection or breakdown with min assist. 09/21/2019 0244 by Sandrea Hammond, LPN Outcome: Progressing 09/21/2019 0226 by Sandrea Hammond, LPN Outcome: Progressing Goal: RH STG MAINTAIN SKIN INTEGRITY WITH ASSISTANCE Description: STG Maintain Skin Integrity With min Assistance. 09/21/2019 0244 by Sandrea Hammond, LPN Outcome: Progressing 09/21/2019 0226 by Sandrea Hammond, LPN Outcome: Progressing Goal: RH STG ABLE TO PERFORM INCISION/WOUND CARE W/ASSISTANCE Description: STG Able To Perform Incision/Wound Care With mod Assistance. 09/21/2019 0244 by Sandrea Hammond, LPN Outcome: Progressing 09/21/2019 0226 by Sandrea Hammond, LPN Outcome: Progressing   Problem: RH SAFETY Goal: RH STG ADHERE TO SAFETY PRECAUTIONS W/ASSISTANCE/DEVICE Description: STG Adhere to Safety Precautions With mod I  Assistance/Device. 09/21/2019 0244 by Sandrea Hammond, LPN Outcome: Progressing 09/21/2019 0226 by Sandrea Hammond, LPN Outcome: Progressing   Problem: RH PAIN MANAGEMENT Goal: RH STG PAIN MANAGED AT OR BELOW PT'S PAIN GOAL Description: < 4 09/21/2019 0244 by Sandrea Hammond, LPN Outcome: Progressing 09/21/2019 0226 by Sandrea Hammond, LPN Outcome: Progressing   Problem: RH KNOWLEDGE DEFICIT Goal: RH STG INCREASE KNOWLEDGE OF DYSPHAGIA/FLUID INTAKE Description: Min assist 09/21/2019 0244 by Sandrea Hammond, LPN Outcome: Progressing 09/21/2019 0226 by Sandrea Hammond, LPN Outcome: Progressing Goal: RH STG INCREASE KNOWLEDGE OF STROKE PROPHYLAXIS Description: Patient/caregiver will verbalize understanding of stroke prophylaxis including monitoring, diet, exercise, and medications with min assist. 09/21/2019 0244 by Sandrea Hammond, LPN Outcome: Progressing 09/21/2019 0226 by Sandrea Hammond, LPN Outcome: Progressing

## 2019-09-21 NOTE — Progress Notes (Signed)
Occupational Therapy Session Note  Patient Details  Name: [name] MRN: [id number] Date of Birth: 12-22-1960  Today's Date: 09/21/2019 OT Individual Time: [id number] OT Individual Time Calculation (min): 32 min    Skilled Therapeutic Interventions/Progress Updates:    Patient seated in w/c, pleasant, cooperative and states that he would like to work on his left hand.  Completed prolonged stretch into flexion of digits with ace wrap assist (x2) with wrist extension and sup/pron activities.  Noted improved grasp after removed from ace wrap.  Completed hand mobility activities, provided soft theraputty and provided exercises for him to complete when not in therapy.  Completed left shoulder ROM/reach activities below 90.  Good tolerance of activity - he remained in the w/c at close of session with call bell in reach.    Therapy Documentation Precautions:  Precautions Precaution Comments: colostomy, L UE WBAT (no longer needs splint), R LE WBAT (no longer needs CAM boot) as of 12/4 Restrictions Weight Bearing Restrictions: Yes LUE Weight Bearing: Weight bear through elbow only RLE Weight Bearing: Weight bearing as tolerated General:   Vital Signs: Therapy Vitals Temp: (!) 97.5 F (36.4 C) Temp Source: Oral Pulse Rate: 83 Resp: 19 BP: 104/83 Patient Position (if appropriate): Lying Oxygen Therapy SpO2: 100 % O2 Device: Room Air Pain: Pain Assessment Pain Scale: 0-10 Pain Score: 0-No pain Other Treatments:     Therapy/Group: Individual Therapy  Carlos Levering 09/21/2019, 4:03 PM

## 2019-09-21 NOTE — Progress Notes (Signed)
[place] PHYSICAL MEDICINE & REHABILITATION PROGRESS NOTE   Subjective/Complaints:  Tired from OT session, completed ADLs now working on LUE fine motor  Knee flexion  Up to 115 deg on L 100deg on R  ROS: Patient denies , nausea, vomiting, diarrhea, cough, shortness of breath or chest pain, joint or back pain,    Objective:   No results found. Recent Labs    09/20/19 0722  WBC 6.2  HGB 10.1*  HCT 30.9*  PLT 704*   Recent Labs    09/20/19 0722  NA 131*  K 3.9  CL 97*  CO2 24  GLUCOSE 107*  BUN 24*  CREATININE 0.82  CALCIUM 10.1    Intake/Output Summary (Last 24 hours) at 09/21/2019 0854 Last data filed at 09/21/2019 0815 Gross per 24 hour  Intake 422 ml  Output 575 ml  Net -153 ml     Physical Exam: Vital Signs Blood pressure 116/81, pulse 77, temperature 98.6 F (37 C), temperature source Oral, resp. rate 16, weight 90.7 kg, SpO2 100 %. Constitutional: No distress . Vital signs reviewed. HEENT: EOMI, oral membranes moist Neck: supple Cardiovascular: RRR without murmur. No JVD    Respiratory: CTA Bilaterally without wheezes or rales. Normal effort    GI: BS +, non-tender, non-distended. Ostomy intact. Granulating abdominal wound Musculoskeletal:     Comments:   Knee ROM around 90 deg+ + right  knee tenderness, no effusion  + left shoulder tenderness. Incision C/D/I   Neurological: He is alert and oriented to person, place, and time.  Motor: Right upper extremity: 4 -/5 proximal distal Left upper extremity: Shoulder abduction, elbow flexion/extension 3- /5, handgrip trace  right lower extremity is in the CPM and cannot be tested. Left lower extremity has 3 - at the hip flexors knee extensors 4 - and ankle dorsiflexors 3 - motor exam stable Sensation intact light touch Dysarthric Left facial weakness ongoing Skin: sutures right hip  Psychiatric: Flat no lability or agitation   Assessment/Plan: 1. Functional deficits secondary to acute brainstem  infarct which require 3+ hours per day of interdisciplinary therapy in a comprehensive inpatient rehab setting.  Physiatrist is providing close team supervision and 24 hour management of active medical problems listed below.  Physiatrist and rehab team continue to assess barriers to discharge/monitor patient progress toward functional and medical goals  Care Tool:  Bathing    Body parts bathed by patient: Left arm, Chest, Abdomen, Face, Right upper leg, Left upper leg, Right arm, Front perineal area, Buttocks   Body parts bathed by helper: Right lower leg, Left lower leg     Bathing assist Assist Level: Minimal Assistance - Patient > 75%     Upper Body Dressing/Undressing Upper body dressing   What is the patient wearing?: Pull over shirt    Upper body assist Assist Level: Minimal Assistance - Patient > 75%    Lower Body Dressing/Undressing Lower body dressing      What is the patient wearing?: Pants     Lower body assist Assist for lower body dressing: Minimal Assistance - Patient > 75%     Toileting Toileting    Toileting assist Assist for toileting: Minimal Assistance - Patient > 75% Assistive Device Comment: urinal   Transfers Chair/bed transfer  Transfers assist     Chair/bed transfer assist level: Set up assist     Locomotion Ambulation   Ambulation assist      Assist level: Minimal Assistance - Patient > 75% Assistive device: Walker-rolling Max  distance: 10'   Walk 10 feet activity   Assist     Assist level: Minimal Assistance - Patient > 75% Assistive device: Walker-rolling   Walk 50 feet activity   Assist Walk 50 feet with 2 turns activity did not occur: Safety/medical concerns  Assist level: Minimal Assistance - Patient > 75%      Walk 150 feet activity   Assist Walk 150 feet activity did not occur: Safety/medical concerns         Walk 10 feet on uneven surface  activity   Assist Walk 10 feet on uneven surfaces  activity did not occur: Safety/medical concerns         Wheelchair     Assist Will patient use wheelchair at discharge?: Yes Type of Wheelchair: Manual    Wheelchair assist level: Supervision/Verbal cueing Max wheelchair distance: 150    Wheelchair 50 feet with 2 turns activity    Assist        Assist Level: Supervision/Verbal cueing   Wheelchair 150 feet activity     Assist      Assist Level: Supervision/Verbal cueing   Blood pressure 116/81, pulse 77, temperature 98.6 F (37 C), temperature source Oral, resp. rate 16, weight 90.7 kg, SpO2 100 %.  Medical Problem List and Plan: 1.  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             -ELOS 12/22            CIR Level PT , OT , SLP -team conf in am       -continue CPM 2.  Antithrombotics: -DVT/anticoagulation:  Pharmaceutical: lovenox 40mg  daily  doppler neg for DVT              -antiplatelet therapy: ASA 3. Pain Management: tylenol for mild and oxycodone for severe pain.              Monitor with increased mobility Doesn't like how pain meds make him feel   12/8: For right knee pain, will add diclofenac gel. For left shoulder pain, will add Lidocaine patch, will ask Ortho to eval effusion, may need arthrocentesis will contact4. Mood: LCSW to follow for support and evaluation. Team to provide ego support             -antipsychotic agents: N/A.  5. Neuropsych: This patient is capable of making decisions on his own behalf. 6. Skin/Wound Care: routine pressure relief measures., abd wound healing  Miliarsia- likely from heat related to overlay, changed benadryl to calamine  -remove sutures right hip 7. Fluids/Electrolytes/Nutrition: Monitor I/O. Encourage intake--he's afraid to eat due to concerns of return of abdominal pain.off TF     12/7 Na 131,129 on 12/8 , 131 on 12/14  8. Right pontine stroke: On ASA for secondary stroke prevention.   9. Mesenteric injuries s/p Ileocecectomy, total colectomy and repair of lacerations: Foley to collect high volume out of ileostomy."liquidy stools expected given lack of lg colon  Lomotil prn  12/13 -asked pt to d/w RN re: ostomy to make sure they are using the type of seal,bag which works for him 10. Fistula/leak/abscess: completed  Zosyn 11/15-->11/29. 12. Situational depression with insomnia: Continue Seroquel at nights.   13. Right knee pain: Question MRI for workup.  14. ORIF left clavicle and left distal radius fracture: NWB left wrist--may WB thorough elbow with platform walker. 15.  IM nailing right femur and ORIF right bimalleolar Fx: .  16.  Anemia:likely ABLA 10.1 on 12/14 17.  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 He has CPM ordered from 0 to 90, was ranged under GA to 125 deg, patient increased rapidly on the right side and has soreness related to this.  On the left side he went up to 115 degrees.  He has goals of 125 degrees.  Cont CPM slowly advancing to goals  .  Continue aggressive rehab efforts  18.  Left MCP, PIP and DIP contracture- extention, will ask OT to perform passive flexion of fingers   19.  Papular rash upper chest and thighs, miliaria-  calamine working well    20.  Dysphagia post stroke, d/w SLP should have normal sensation due to pontine location, may have anxiety component   LOS: 11 days A FACE TO FACE EVALUATION WAS PERFORMED  Charlett Blake 09/21/2019, 8:54 AM

## 2019-09-21 NOTE — Progress Notes (Signed)
Speech Language Pathology Daily Session Note  Patient Details  Name: [name] MRN: [id number] Date of Birth: 1960-10-10  Today's Date: 09/21/2019 SLP Individual Time: 1100-1155 SLP Individual Time Calculation (min): 55 min  Short Term Goals: Week 3: SLP Short Term Goal 1 (Week 3): Pt will consume trials of dysphagia 3 with Supervision A cues for liquid wash and effortful swallow to reduce pharyngeal reduce. SLP Short Term Goal 2 (Week 3): Pt will complete 3 sets of 10 pharyngeal strengthening exercises specifically targeting base of tongue strength with Min A cues. SLP Short Term Goal 3 (Week 3): Pt will complete 3 sets of 15 resp of EMST and 3 sets of 10 reps of IMST Mod I and self-perceived effort of 8 out of 10. SLP Short Term Goal 4 (Week 3): Pt will incresae vocal intensity at the conversation level to convey abstract information in noisy environment to achive ~ 90% intelligibility with supervision level cues.  Skilled Therapeutic Interventions:  Skilled ST services focused on swallow skills. SLP facilitated PO consumption of dys 3 trial lunch tray, pt demonstrated appropriate/careful mastication, use of effortful swallow and liquid wash Mod I with no overt s/s aspiration. SLP upgraded diet to dys 3 solid textures and will continue to assess diet tolerance along with mod I use of swallow strategies. Pt completed 10 repetitions x2 sets of IMST and EMST device with self-perceived effortful score of 7/10. Pt expressed desire to change colostomy bag, pressed call bell and required supervision A verbal cues to repeat message with increased vocal intensity. Pt was left in room with call bell within reach and bed alarm set. ST recommends to continue skilled ST services.      Pain Pain Assessment Pain Score: 0-No pain  Therapy/Group: Individual Therapy  [name]  [facility] 09/21/2019, 12:09 PM

## 2019-09-21 NOTE — Progress Notes (Signed)
Occupational Therapy Session Note  Patient Details  Name: [name] MRN: [id number] Date of Birth: 1961/08/28  Today's Date: 09/21/2019 OT Individual Time: 1950-9326 OT Individual Time Calculation (min): 60 min    Short Term Goals: Week 1:  OT Short Term Goal 1 - Progress (Week 1): Met OT Short Term Goal 2 - Progress (Week 1): Progressing toward goal OT Short Term Goal 3 - Progress (Week 1): Met Week 2:  OT Short Term Goal 1 (Week 2): STGs=LTGs due to ELOS OT Short Term Goal 1 - Progress (Week 2): Progressing toward goal  Skilled Therapeutic Interventions/Progress Updates:    1:1 Pt in bed when arrived. Pt able to come to EOB with supervision with bed rail. Pt able to perform scoot pivot with min guard (without AFOs) into the w/c. Pt perform UB bathing at sink with setup and extra time as well as donning shirt with supervision. Pt doffed socks with reacher. Required A to bathe bilateral feet. Pt choose to forego bathing LEs and change pants (he was wearing his only pair). Once socks on the sock aide pt able to don socks. Required A to don bilateral shoes with AFOs. Pt ambulated into the bathroom with RW with hand splint on left UE with min A/ min guard. Pt able to pull down pants and perform hygiene/bathing periarea with supervision. A to don clean brief with total A. Pt ambulated back out of to w/c at sink with min guard. Hair washed with hair basin with total A. Pt able to perform toothbrushing and hair brushing with setup.   Engaged in [place] for left UE. Pt presents with difficulty with PIP/DIP flexion active and passive. Pt perform bicep curls while holding large circular roll as well as performed extension and flexion of wrist. Use of yellow egg crate foam to focus on left hand grasp and manipulation. Also issued pink foam pieces with goal of functional reach on table to obtain them and pick them up with pincher grasp and place in cup with hole with goal to isolate index finger flexion.    Therapy Documentation Precautions:  Precautions Precaution Comments: colostomy, L UE WBAT (no longer needs splint), R LE WBAT (no longer needs CAM boot) as of 12/4 Restrictions Weight Bearing Restrictions: Yes LUE Weight Bearing: Weight bear through elbow only RLE Weight Bearing: Weight bearing as tolerated Pain: No reports of pain but did c/o fatigue after little activity. Provided rest breaks as needed and encouragement   Therapy/Group: Individual Therapy  Willeen [facility] 09/21/2019, 11:33 AM

## 2019-09-22 ENCOUNTER — Inpatient Hospital Stay (HOSPITAL_COMMUNITY): Payer: 59

## 2019-09-22 ENCOUNTER — Inpatient Hospital Stay (HOSPITAL_COMMUNITY): Payer: 59 | Admitting: Physical Therapy

## 2019-09-22 NOTE — Progress Notes (Signed)
Pt rested well through the night. Vitals signs are WNL. Call bell within reach.

## 2019-09-22 NOTE — Progress Notes (Signed)
Speech Language Pathology Daily Session Note  Patient Details  Name: [name] MRN: [id number] Date of Birth: [birth year]  Today's Date: 09/22/2019 SLP Individual Time: 1130-1225 SLP Individual Time Calculation (min): 55 min  Short Term Goals: Week 3: SLP Short Term Goal 1 (Week 3): Pt will consume trials of dysphagia 3 with Supervision A cues for liquid wash and effortful swallow to reduce pharyngeal reduce. SLP Short Term Goal 2 (Week 3): Pt will complete 3 sets of 10 pharyngeal strengthening exercises specifically targeting base of tongue strength with Min A cues. SLP Short Term Goal 3 (Week 3): Pt will complete 3 sets of 15 resp of EMST and 3 sets of 10 reps of IMST Mod I and self-perceived effort of 8 out of 10. SLP Short Term Goal 4 (Week 3): Pt will incresae vocal intensity at the conversation level to convey abstract information in noisy environment to achive ~ 90% intelligibility with supervision level cues.  Skilled Therapeutic Interventions: Skilled ST services focused on swallow and speech skills. SLP facilitated PO consumption of newly upgraded dys 3 and thin liquid lunch tray, pt demonstrated mod I use of swallow strategies and no overt s/s aspiration. Pt consumed regular textured sanck trial with use of effortful swallow and liquid wash, Mod I, no overt s/s aspiration nor sensation of residue. Pt did demonstrate delayed cough consuming thin liquid via cup later in session was required supervision A verbal cues for use head turn an chin tuck. Pt preformed IMST upgraded to 39 cm H2O and EMST set at 45cm H2O 10 repetition x2 reporting self-perceived effortful level of 03/13/09. Pt was unable to initiate swallow in masako exercises on x5 opportunities. Pt did demonstrated increase in vocal intensity when utilizing call bell and communicating on phone with 90% intelligibility. SLP facilitated use of increase vocal intensity in a mildly noisy environment (tv and air mattress on), pt  required supervision A verbal cues for 90% intelligibility in conversation. Pt was left in room with call bell within reach and bed alarm set. ST recommends to continue skilled ST services.      Pain Pain Assessment Pain Score: 0-No pain  Therapy/Group: Individual Therapy  Rozena Fierro  [facility] 09/22/2019, 12:43 PM

## 2019-09-22 NOTE — Consult Note (Signed)
[place] Nurse ostomy follow up Stoma type/location:  RUQ, ileostomy Stomal assessment/size: pink, moist, flush with skin  Peristomal assessment: NA Treatment options for stomal/peristomal skin: 2" barrier ring Output liquid green stool Ostomy pouching: 1pc.convex pouch intact from last pouch change. Will plan for pouch change on Friday just prior to planned DC.  Education provided:  Met with patient discussed supplies  Discussed placement of pouch downward instead of to the side Patient needs assistance with pouching and emptying due to limited use of his left hand Spoke with wife by phone; she reports comfort with pouch change however she is trying to set up supplies through [place].  I have assisted her with item numbers and surgeons contact information.  1pc flex convex and 2" barrier rings ordered for patient room/use/DC Enrolled patient in [facility] Discharge program: Yes  [place], [place], [place]

## 2019-09-22 NOTE — Progress Notes (Signed)
Physical Therapy Session Note  Patient Details  Name: [name] MRN: [id number] Date of Birth: 1961-09-01  Today's Date: 09/22/2019 PT Individual Time: 0805-0900 PT Individual Time Calculation (min): 55 min   Short Term Goals: Week 3:  PT Short Term Goal 1 (Week 3): Pt will consistently perform sit<>stand transfers with CGA from WC height of 21 inches. PT Short Term Goal 2 (Week 3): Pt will ambulate >88ft with min assist and LRAD PT Short Term Goal 3 (Week 3): Pt will transfer to [facility] with CGA and LRAD consistently PT Short Term Goal 4 (Week 3): Pt will initiate stair management training with mod assist for 1, 3inch step.  Skilled Therapeutic Interventions/Progress Updates: Pt presented in bed agreeable to therapy. Pt stating most pain in L hand 7/10 nsg present to administer meds. Once completed pt performed bed mobility supervision with use of bed features. PTA donned B AFOs/shoes total A for time management. Performed squat pivot transfer to w/c supervision and transported to sink to perform oral hygiene mod I. Pt then transported to day room to performed NuStep L2 x 5 min for B ROM of knee and general conditioning. Pt was also able to maintain light grip on L handle without splint. Pt then transferred back to w/c in same manner as prior and propelled to rehab gym with increased time and supervision. Pt then transferred to mat via squat pivot and participated in STS from 20 in level x 5 with rest breaks as needed. Pt also performed toe taps to 6 in step x 5 bilaterally for wt shifting with cues to work on [employer] when RLE is in stance position. Pt ambulated 67ft with CGA initially to minA with fatigue. Once completed pt with c/o of nausea which resolved after a few minutes rest. Pt transported back to room and was able to ambulate additional 21ft to toilet. Performed toilet transfer with CGA. Pt left at toilet verbalizing to use call bell once completed and NT notified of pt's disposition.       Therapy Documentation Precautions:  Precautions Precaution Comments: colostomy, L UE WBAT (no longer needs splint), R LE WBAT (no longer needs CAM boot) as of 12/4 Restrictions Weight Bearing Restrictions: Yes LUE Weight Bearing: Weight bear through elbow only RLE Weight Bearing: Weight bearing as tolerated General:   Vital Signs:  Pain: Pain Assessment Pain Score: 0-No pain   Therapy/Group: Individual Therapy  Sou Nohr  Charistopher Rumble, PTA  09/22/2019, 12:41 PM
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
## 2019-09-22 NOTE — Plan of Care (Signed)
  Problem: Consults Goal: RH STROKE PATIENT EDUCATION Description: See Patient Education module for education specifics  Outcome: Progressing   Problem: RH BOWEL ELIMINATION Goal: RH STG MANAGE BOWEL WITH ASSISTANCE Description: STG Manage Bowel with mod Assistance. Outcome: Progressing Goal: RH STG MANAGE BOWEL W/MEDICATION W/ASSISTANCE Description: STG Manage Bowel with Medication with mod I Assistance. Outcome: Progressing   Problem: RH BLADDER ELIMINATION Goal: RH STG MANAGE BLADDER WITH ASSISTANCE Description: STG Manage Bladder With min Assistance Outcome: Progressing   Problem: RH SKIN INTEGRITY Goal: RH STG SKIN FREE OF INFECTION/BREAKDOWN Description: Patients skin will remain free from further infection or breakdown with min assist. Outcome: Progressing Goal: RH STG MAINTAIN SKIN INTEGRITY WITH ASSISTANCE Description: STG Maintain Skin Integrity With min Assistance. Outcome: Progressing Goal: RH STG ABLE TO PERFORM INCISION/WOUND CARE W/ASSISTANCE Description: STG Able To Perform Incision/Wound Care With mod Assistance. Outcome: Progressing   Problem: RH SAFETY Goal: RH STG ADHERE TO SAFETY PRECAUTIONS W/ASSISTANCE/DEVICE Description: STG Adhere to Safety Precautions With mod I Assistance/Device. Outcome: Progressing   Problem: RH PAIN MANAGEMENT Goal: RH STG PAIN MANAGED AT OR BELOW PT'S PAIN GOAL Description: < 4 Outcome: Progressing   Problem: RH KNOWLEDGE DEFICIT Goal: RH STG INCREASE KNOWLEDGE OF DYSPHAGIA/FLUID INTAKE Description: Min assist Outcome: Progressing Goal: RH STG INCREASE KNOWLEDGE OF STROKE PROPHYLAXIS Description: Patient/caregiver will verbalize understanding of stroke prophylaxis including monitoring, diet, exercise, and medications with min assist. Outcome: Progressing

## 2019-09-22 NOTE — Progress Notes (Signed)
Social Work Patient ID: [name], male   DOB: 07-Mar-1961, 58 y.o.   MRN: [id number]  Met with pt and spoke with wife via telephone to discuss team conference progress toward his goals and discharge date 12/22. Wife feels he is ready now where as last week he was no where near ready to go home. 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 Family education Friday with brother in-law Marya Amsler and wife. Work toward discharge next Tuesday

## 2019-09-22 NOTE — Progress Notes (Signed)
Occupational Therapy Session Note  Patient Details  Name: [name] MRN: [id number] Date of Birth: June 10, 1961  Today's Date: 09/22/2019 OT Individual Time: 1300-1357 OT Individual Time Calculation (min): 57 min    Short Term Goals: Week 1:  OT Short Term Goal 1 - Progress (Week 1): Met OT Short Term Goal 2 - Progress (Week 1): Progressing toward goal OT Short Term Goal 3 - Progress (Week 1): Met  Skilled Therapeutic Interventions/Progress Updates:    1;1. Pt received in bed agreeable to OT with no pain. Pt completes squat pivot transfer with close S and VC for hand placement. Pt agreeable to bathing UB at sink with set up and changing shirt. Pt reporting need to toilet and transfers via MIN A stand pivot using grab bar to toilet with MIN A provided during standing balance for CM/Sfor hygiene seated. Pt wanting to work on LUE. [place] wrapped digits around towel roll in composite flexion while working on wrist/forearm PROM/AROM with manual resistance. Pt with improved digit flexion at end of session after PROM. Exited session with tp setaed in bed, exit alarm on and call light in reach  Therapy Documentation Precautions:  Precautions Precaution Comments: colostomy, L UE WBAT (no longer needs splint), R LE WBAT (no longer needs CAM boot) as of 12/4 Restrictions Weight Bearing Restrictions: Yes LUE Weight Bearing: Weight bear through elbow only RLE Weight Bearing: Weight bearing as tolerated General:   Vital Signs:  Pain: Pain Assessment Pain Score: 0-No pain ADL:   Vision   Perception    Praxis   Exercises:   Other Treatments:     Therapy/Group: Individual Therapy  Tonny Branch 09/22/2019, 1:49 PM

## 2019-09-22 NOTE — Discharge Summary (Signed)
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 capsule Commonly known as: CELEBREX Take 1 capsule (200 mg total) by mouth daily. Notes to patient: Can change to as needed --will help with kidney function/hydration   diclofenac Sodium 1 % Gel Commonly known as: VOLTAREN Apply 2 g topically 3 (three) times daily as needed (knee pain).   diphenoxylate-atropine 2.5-0.025 MG tablet Commonly known as: LOMOTIL Take 2 tablets by mouth 2 (two) times daily.   lidocaine 5 % Commonly known as: LIDODERM Place 1 patch onto the skin daily. Remove & Discard patch within 12 hours or as directed by MD Notes to patient: Purchase over the counter   Melatonin 3 MG Tabs Take 2 tablets (6 mg total) by mouth at bedtime as needed (insomnia).   multivitamin with minerals Tabs tablet Take 1 tablet by mouth daily.   propranolol 20 MG tablet Commonly known as: INDERAL Take 1 tablet (20 mg total) by mouth 3 (three) times daily.   QUEtiapine 25 MG tablet Commonly known as: SEROQUEL Take 1 tablet (25 mg total) by mouth at bedtime. Notes to patient: For insomnia and anxiety   traMADol 50 MG  tablet--Rx#  Commonly known as: ULTRAM Take 1-2 tablets (50-100 mg total) by mouth every 6 (six) hours as needed for severe pain.   Vitamin D3 25 MCG tablet Commonly known as: Vitamin D Take 2 tablets (2,000 Units total) by mouth 2 (two) times daily. Notes to patient: Over the counter for bone health   zolpidem 5 MG tablet Commonly known as: AMBIEN Take 1 tablet (5 mg total) by mouth at bedtime.      Follow-up Information    Kirsteins, Luanna Salk, MD Follow up.   Specialty: Physical Medicine and Rehabilitation Why: Office will call you with follow up appointment Contact information: [place] [address] [phone]        Shona Needles,  MD. Schedule an appointment as soon as possible for a visit in 2 week(s).   Specialty: Orthopedic Surgery Why: for repeat x-rays left clavicle, right ankle Contact information: [place] [address] [phone]        Meredith Pel, MD. Call.   Specialty: Orthopedic Surgery Why: for follow up on wrist fracture Contact information: [address] [address] [phone]        [place]. Call.   Why: in 1-2 days for post op appointment and any issues with abdomen/colostomy Contact information: [place] [phone] [place]. Call.   Why: in 1-2 days for post hospital follow up Contact information: [address]     [place] [phone]          Signed: Bary Leriche 09/28/2019, 12:32 PM

## 2019-09-22 NOTE — Progress Notes (Signed)
[facility] [facility] PHYSICAL MEDICINE & REHABILITATION PROGRESS NOTE   Subjective/Complaints:  Tired from OT session, completed ADLs now working on LUE fine motor  Knee flexion  Up to 115 deg on L 100deg on R  ROS: Patient denies , nausea, vomiting, diarrhea, cough, shortness of breath or chest pain, joint or back pain,    Objective:   No results found. Recent Labs    09/20/19 0722  WBC 6.2  HGB 10.1*  HCT 30.9*  PLT 704*   Recent Labs    09/20/19 0722  NA 131*  K 3.9  CL 97*  CO2 24  GLUCOSE 107*  BUN 24*  CREATININE 0.82  CALCIUM 10.1    Intake/Output Summary (Last 24 hours) at 09/22/2019 0914 Last data filed at 09/22/2019 4259 Gross per 24 hour  Intake 600 ml  Output 1250 ml  Net -650 ml     Physical Exam: Vital Signs Blood pressure 124/80, pulse 76, temperature (!) 97.4 F (36.3 C), temperature source Oral, resp. rate 20, weight 92.1 kg, SpO2 100 %. Constitutional: No distress . Vital signs reviewed. HEENT: EOMI, oral membranes moist Neck: supple Cardiovascular: RRR without murmur. No JVD    Respiratory: CTA Bilaterally without wheezes or rales. Normal effort    GI: BS +, non-tender, non-distended. Ostomy intact. Granulating abdominal wound Musculoskeletal:     Comments:   Knee ROM around 90 deg+ + right  knee tenderness, no effusion  + left shoulder tenderness. Incision C/D/I   Neurological: He is alert and oriented to person, place, and time.  Motor: Right upper extremity: 4 -/5 proximal distal Left upper extremity: Shoulder abduction, elbow flexion/extension 3- /5, handgrip trace  right lower extremity is in the CPM and cannot be tested. Left lower extremity has 3 - at the hip flexors knee extensors 4 - and ankle dorsiflexors 3 - motor exam stable Sensation intact light touch Dysarthric Left facial weakness ongoing Skin: sutures right hip  Psychiatric: Flat no lability or agitation   Assessment/Plan: 1. Functional deficits secondary to acute  brainstem infarct which require 3+ hours per day of interdisciplinary therapy in a comprehensive inpatient rehab setting.  Physiatrist is providing close team supervision and 24 hour management of active medical problems listed below.  Physiatrist and rehab team continue to assess barriers to discharge/monitor patient progress toward functional and medical goals  Care Tool:  Bathing    Body parts bathed by patient: Left arm, Chest, Abdomen, Face, Right upper leg, Left upper leg, Right arm, Front perineal area, Buttocks   Body parts bathed by helper: Right lower leg, Left lower leg     Bathing assist Assist Level: Minimal Assistance - Patient > 75%     Upper Body Dressing/Undressing Upper body dressing   What is the patient wearing?: Pull over shirt    Upper body assist Assist Level: Set up assist    Lower Body Dressing/Undressing Lower body dressing      What is the patient wearing?: Pants     Lower body assist Assist for lower body dressing: Minimal Assistance - Patient > 75%     Toileting Toileting    Toileting assist Assist for toileting: Minimal Assistance - Patient > 75% Assistive Device Comment: urinal   Transfers Chair/bed transfer  Transfers assist     Chair/bed transfer assist level: Set up assist     Locomotion Ambulation   Ambulation assist      Assist level: Minimal Assistance - Patient > 75% Assistive device: Walker-rolling Max distance: 58'  Walk 10 feet activity   Assist     Assist level: Minimal Assistance - Patient > 75% Assistive device: Walker-rolling   Walk 50 feet activity   Assist Walk 50 feet with 2 turns activity did not occur: Safety/medical concerns  Assist level: Minimal Assistance - Patient > 75%      Walk 150 feet activity   Assist Walk 150 feet activity did not occur: Safety/medical concerns         Walk 10 feet on uneven surface  activity   Assist Walk 10 feet on uneven surfaces activity did  not occur: Safety/medical concerns         Wheelchair     Assist Will patient use wheelchair at discharge?: Yes Type of Wheelchair: Manual    Wheelchair assist level: Supervision/Verbal cueing Max wheelchair distance: 150    Wheelchair 50 feet with 2 turns activity    Assist        Assist Level: Supervision/Verbal cueing   Wheelchair 150 feet activity     Assist      Assist Level: Supervision/Verbal cueing   Blood pressure 124/80, pulse 76, temperature (!) 97.4 F (36.3 C), temperature source Oral, resp. rate 20, weight 92.1 kg, SpO2 100 %.  Medical Problem List and Plan: 1.  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             -ELOS 12/22            CIR Level PT , OT , SLP - Team conference today please see physician documentation under team conference tab, met with team face-to-face to discuss problems,progress, and goals. Formulized individual treatment plan based on medical history, underlying problem and comorbidities.      -continue CPM 2.  Antithrombotics: -DVT/anticoagulation:  Pharmaceutical: lovenox 73m daily  doppler neg for DVT              -antiplatelet therapy: ASA 3. Pain Management: tylenol for mild and oxycodone for severe pain.              Monitor with increased mobility Doesn't like how pain meds make him feel   12/8: For right knee pain, will add diclofenac gel. For left shoulder pain, will add Lidocaine patch, will ask Ortho to eval effusion, may need arthrocentesis will contact4. Mood: LCSW to follow for support and evaluation. Team to provide ego support             -antipsychotic agents: N/A.  5. Neuropsych: This patient is capable of making decisions on his own behalf. 6. Skin/Wound Care: routine pressure relief measures., abd wound healing  Miliarsia- likely from heat related to overlay, changed benadryl to calamine  -remove sutures right hip 7.  Fluids/Electrolytes/Nutrition: Monitor I/O. Encourage intake--he's afraid to eat due to concerns of return of abdominal pain.off TF     12/7 Na 131,129 on 12/8 , 131 on 12/14  8. Right pontine stroke: On ASA for secondary stroke prevention.  9. Mesenteric injuries s/p Ileocecectomy, total colectomy and repair of lacerations: Foley to collect high volume out of ileostomy."liquidy stools expected given lack of lg colon  Lomotil prn  12/13 -asked pt to d/w RN re: ostomy to make sure they are using the type of seal,bag which works for him 10. Fistula/leak/abscess: completed  Zosyn 11/15-->11/29. 12. Situational depression with insomnia: Continue Seroquel at nights.   13. Right knee pain: Question MRI for workup.  14. ORIF left clavicle and left distal  radius fracture: NWB left wrist--may WB thorough elbow with platform walker. 15.  IM nailing right femur and ORIF right bimalleolar Fx: .  16. Anemia:likely ABLA 10.1 on 12/14 17.  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 He has CPM ordered bilaterally , was ranged under GA to 125 deg, patient increased rapidly on the right side and has soreness related to this.  On the left side he is  up to 115 degrees. Right knee to 90 degrees .  Cont CPM slowly advancing to goals  .  Continue aggressive rehab efforts  18.  Left MCP, PIP and DIP contracture- extention, will ask OT to perform passive flexion of fingers   19.  Papular rash upper chest and thighs, miliaria-  calamine working well    20.  Dysphagia post stroke, d/w SLP should have normal sensation due to pontine location, may have anxiety component   LOS: 12 days A FACE TO FACE EVALUATION WAS PERFORMED  Charlett Blake 09/22/2019, 9:14 AM

## 2019-09-23 ENCOUNTER — Inpatient Hospital Stay (HOSPITAL_COMMUNITY): Payer: 59 | Admitting: Occupational Therapy

## 2019-09-23 ENCOUNTER — Inpatient Hospital Stay (HOSPITAL_COMMUNITY): Payer: 59 | Admitting: Physical Therapy

## 2019-09-23 ENCOUNTER — Inpatient Hospital Stay (HOSPITAL_COMMUNITY): Payer: 59 | Admitting: Speech Pathology

## 2019-09-23 NOTE — Plan of Care (Signed)
  Problem: RH BOWEL ELIMINATION Goal: RH STG MANAGE BOWEL WITH ASSISTANCE Description: STG Manage Bowel with mod Assistance. Outcome: Not Progressing; ileostomy

## 2019-09-23 NOTE — Progress Notes (Addendum)
Physical Therapy Session Note  Patient Details  Name: [name] MRN: [id number] Date of Birth: Nov 01, 1960  Today's Date: 09/23/2019 PT Individual Time: 1330-1430 AND 1650-1730  PT Individual Time Calculation (min): 60 min and 40 min   Short Term Goals: Week 3:  PT Short Term Goal 1 (Week 3): Pt will consistently perform sit<>stand transfers with CGA from WC height of 21 inches. PT Short Term Goal 2 (Week 3): Pt will ambulate >51f with min assist and LRAD PT Short Term Goal 3 (Week 3): [facility] PT Short Term Goal 4 (Week 3): Pt will initiate stair management training with mod assist for 1, 3inch step.  Skilled Therapeutic Interventions/Progress Updates:   Pt received supine in bed and agreeable to PT. Supine>sit transfer without assist or cues. PT assisted pt to don Bil AFO and shoes sitting EOB. [facility]at [facility] and min cues for improved LE positioning to prevent hitting gluteal surface on WC wheel.   Stair management training with BUE support on 1 rail L to simulate home environment. Attempted 1 step to ascend with RLE and mod assist. Then performed ascent and descent with LLE leading and min assist overall and moderate cues for posture technique.   Gait training with RW 2 x 435fwith supervision assist. Pt reports mild SOB following first bout SpO2 100%. Pt re-assessed following second bout reaching 85% on the L hand and 99% -100% on the R.   WC Mobility with BUE x 20060fith supervision assist for improved symmetry in BUE to maintain straight path.   Kinetron BLE AAROM 3 x 3 min with increasing knee flexion for each bout pt approx 95-100deg in Bil knees.   Throughout treatment, pt performed sit<>stand with close supervision assist fom PT for safety and cues for LUE management on hand splint. Patient returned to room and [facility] reach and all needs met.     Session 2.  Pt  received supine in bed and agreeable to PT. Supine>sit transfer without assist. Squat pivot transfer to [facility] with cues for set up. PT treatment focused on dynamic standing balance and functional grasp with the LUE. While in standing with 1 UE support, Lateral reach to place horse shoe on basket ball goal x 8 BUE with min assist for balance and to maintain grasp with the LUE. Place and remove clothes pins from wire tree 2 x 5 BUE with yellow on the L and green on the R. Patient returned to room and left sitting in WC [facility] in reach and all needs met.            Therapy Documentation Precautions:  Precautions Precaution Comments: colostomy, L UE WBAT (no longer needs splint), R LE WBAT (no longer needs CAM boot) as of 12/4 Restrictions Weight Bearing Restrictions: Yes LUE Weight Bearing: Weight bear through elbow only RLE Weight Bearing: Weight bearing as tolerated    Vital Signs: Therapy Vitals Temp: 98.2 F (36.8 C) Pulse Rate: 88 Resp: 18 BP: 107/85 Patient Position (if appropriate): Lying Oxygen Therapy SpO2: 100 % O2 Device: Room Air Pain:   denies   Therapy/Group: Individual Therapy  AusLorie Phenix/17/2020, 3:04 PM

## 2019-09-23 NOTE — Progress Notes (Signed)
Occupational Therapy Session Note  Patient Details  Name: [name] MRN: [id number] Date of Birth: July 31, 1961  Today's Date: 09/23/2019 OT Individual Time: 1130-1202 OT Individual Time Calculation (min): 32 min    Skilled Therapeutic Interventions/Progress Updates:    Patient in bed, alert and ready to get up for the day.  Bed mobility with CGA.  Sit pivot transfer to/from bed, w/c and toilet with CGA.  Completed left UE AAROM and stretching shoulder to hand with good results - patient notes ongoing stiffness, reviewed massage of hand and use of right to stretch digits.  Transfer to toilet at end of session with CGA, CGA for clothing management, call bell in reach and aide aware that patient will call when finished.    Therapy Documentation Precautions:  Precautions Precaution Comments: colostomy, L UE WBAT (no longer needs splint), R LE WBAT (no longer needs CAM boot) as of 12/4 Restrictions Weight Bearing Restrictions: Yes LUE Weight Bearing: Weight bear through elbow only RLE Weight Bearing: Weight bearing as tolerated General:   Vital Signs: Therapy Vitals Temp: 98.2 F (36.8 C) Temp Source: Oral Pulse Rate: 88 Resp: 18 BP: 107/85 Patient Position (if appropriate): Lying Oxygen Therapy SpO2: 100 % O2 Device: Room Air Pain: Pain Assessment Pain Scale: 0-10 Pain Score: 0-No pain  Therapy/Group: Individual Therapy  Carlos Levering 09/23/2019, 3:53 PM

## 2019-09-23 NOTE — Progress Notes (Signed)
Patient refuses CPM today claims he did  good work out with PT today at 430pm

## 2019-09-23 NOTE — Progress Notes (Signed)
[place] PHYSICAL MEDICINE & REHABILITATION PROGRESS NOTE   Subjective/Complaints: Working with speech therapy, no c/o Slept well, knee pain improved  Knee flexion  Up to 120 deg on L 105deg on R  ROS: Patient denies , nausea, vomiting, diarrhea, cough, shortness of breath or chest pain, joint or back pain,    Objective:   No results found. No results for input(s): WBC, HGB, HCT, PLT in the last 72 hours. No results for input(s): NA, K, CL, CO2, GLUCOSE, BUN, CREATININE, CALCIUM in the last 72 hours.  Intake/Output Summary (Last 24 hours) at 09/23/2019 0930 Last data filed at 09/23/2019 0533 Gross per 24 hour  Intake 720 ml  Output 805 ml  Net -85 ml     Physical Exam: Vital Signs Blood pressure 120/88, pulse 80, temperature 98 F (36.7 C), resp. rate 18, weight 92.1 kg, SpO2 100 %. Constitutional: No distress . Vital signs reviewed. HEENT: EOMI, oral membranes moist Neck: supple Cardiovascular: RRR without murmur. No JVD    Respiratory: CTA Bilaterally without wheezes or rales. Normal effort    GI: BS +, non-tender, non-distended. Ostomy intact. Granulating abdominal wound Musculoskeletal:     Comments:   Knee ROM around 90 deg+ + right  knee tenderness, no effusion  + left shoulder tenderness. Incision C/D/I   Neurological: He is alert and oriented to person, place, and time.  Motor: Right upper extremity: 4 -/5 proximal distal Left upper extremity: Shoulder abduction, elbow flexion/extension 3- /5, handgrip trace  right lower extremity is in the CPM and cannot be tested. Left lower extremity has 3 - at the hip flexors knee extensors 4 - and ankle dorsiflexors 3 - motor exam stable Sensation intact light touch Dysarthric Left facial weakness ongoing   Psychiatric: Flat no lability or agitation   Assessment/Plan: 1. Functional deficits secondary to acute brainstem infarct which require 3+ hours per day of interdisciplinary therapy in a comprehensive  inpatient rehab setting.  Physiatrist is providing close team supervision and 24 hour management of active medical problems listed below.  Physiatrist and rehab team continue to assess barriers to discharge/monitor patient progress toward functional and medical goals  Care Tool:  Bathing    Body parts bathed by patient: Left arm, Chest, Abdomen, Face, Right upper leg, Left upper leg, Right arm, Front perineal area, Buttocks   Body parts bathed by helper: Right lower leg, Left lower leg     Bathing assist Assist Level: Minimal Assistance - Patient > 75%     Upper Body Dressing/Undressing Upper body dressing   What is the patient wearing?: Pull over shirt    Upper body assist Assist Level: Set up assist    Lower Body Dressing/Undressing Lower body dressing      What is the patient wearing?: Pants     Lower body assist Assist for lower body dressing: Minimal Assistance - Patient > 75%     Toileting Toileting    Toileting assist Assist for toileting: Minimal Assistance - Patient > 75% Assistive Device Comment: urinal   Transfers Chair/bed transfer  Transfers assist     Chair/bed transfer assist level: Supervision/Verbal cueing     Locomotion Ambulation   Ambulation assist      Assist level: Minimal Assistance - Patient > 75% Assistive device: Walker-rolling Max distance: 23ft   Walk 10 feet activity   Assist     Assist level: Minimal Assistance - Patient > 75% Assistive device: Walker-rolling   Walk 50 feet activity   Assist Walk  50 feet with 2 turns activity did not occur: Safety/medical concerns  Assist level: Minimal Assistance - Patient > 75%      Walk 150 feet activity   Assist Walk 150 feet activity did not occur: Safety/medical concerns         Walk 10 feet on uneven surface  activity   Assist Walk 10 feet on uneven surfaces activity did not occur: Safety/medical concerns         Wheelchair     Assist Will  patient use wheelchair at discharge?: Yes Type of Wheelchair: Manual    Wheelchair assist level: Supervision/Verbal cueing Max wheelchair distance: 150    Wheelchair 50 feet with 2 turns activity    Assist        Assist Level: Supervision/Verbal cueing   Wheelchair 150 feet activity     Assist      Assist Level: Supervision/Verbal cueing   Blood pressure 120/88, pulse 80, temperature 98 F (36.7 C), resp. rate 18, weight 92.1 kg, SpO2 100 %.  Medical Problem List and Plan: 1.  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             -ELOS 12/22            CIR Level PT , OT , SLP -       -continue CPM 2.  Antithrombotics: -DVT/anticoagulation:  Pharmaceutical: lovenox 40mg  daily  doppler neg for DVT              -antiplatelet therapy: ASA 3. Pain Management: tylenol for mild and oxycodone for severe pain.              Monitor with increased mobility Doesn't like how pain meds make him feel   12/8: For right knee pain, will add diclofenac gel. For left shoulder pain, will add Lidocaine patch, will ask Ortho to eval effusion, may need arthrocentesis will contact4. Mood: LCSW to follow for support and evaluation. Team to provide ego support             -antipsychotic agents: N/A.  5. Neuropsych: This patient is capable of making decisions on his own behalf. 6. Skin/Wound Care: routine pressure relief measures., abd wound healing  Miliarsia- likely from heat related to overlay, changed benadryl to calamine  -remove sutures right hip 7. Fluids/Electrolytes/Nutrition: Monitor I/O. Encourage intake--he's afraid to eat due to concerns of return of abdominal pain.off TF     12/7 Na 131,129 on 12/8 , 131 on 12/14  8. Right pontine stroke: On ASA for secondary stroke prevention.  9. Mesenteric injuries s/p Ileocecectomy, total colectomy and repair of lacerations: Foley to collect high volume out of  ileostomy."liquidy stools expected given lack of lg colon  Lomotil prn  12/13 -asked pt to d/w RN re: ostomy to make sure they are using the type of seal,bag which works for him 10. Fistula/leak/abscess: completed  Zosyn 11/15-->11/29. 12. Situational depression with insomnia: Continue Seroquel at nights.   13. Right knee pain: Question MRI for workup.  14. ORIF left clavicle and left distal radius fracture: NWB left wrist--may WB thorough elbow with platform walker. 15.  IM nailing right femur and ORIF right bimalleolar Fx: .  16. Anemia:likely ABLA 10.1 on 12/14 17.  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 He has CPM ordered bilaterally , was ranged under GA to 125 deg, patient increased  rapidly on the right side and has soreness related to this.  On the left side he is  up to 120 degrees. Right knee to 105 degrees .  Cont CPM slowly advancing to goals  .  Continue aggressive rehab efforts  18.  Left MCP, PIP and DIP contracture- extention, will ask OT to perform passive flexion of fingers   19.  Papular rash upper chest and thighs, miliaria-  calamine working well    20.  Dysphagia post stroke, d/w SLP should have normal sensation due to pontine location, may have anxiety component   LOS: 13 days A FACE TO FACE EVALUATION WAS PERFORMED  Charlett Blake 09/23/2019, 9:30 AM

## 2019-09-23 NOTE — Progress Notes (Signed)
Speech Language Pathology Daily Session Note  Patient Details  Name: [name] MRN: [id number] Date of Birth: Nov 25, 1960  Today's Date: 09/23/2019 SLP Individual Time: 0831-0903 SLP Individual Time Calculation (min): 32 min   SLP Individual Time: [id number] SLP Individual Time Calculation (min): 17 min  Short Term Goals: Week 3: SLP Short Term Goal 1 (Week 3): Pt will consume trials of dysphagia 3 with Supervision A cues for liquid wash and effortful swallow to reduce pharyngeal reduce. SLP Short Term Goal 2 (Week 3): Pt will complete 3 sets of 10 pharyngeal strengthening exercises specifically targeting base of tongue strength with Min A cues. SLP Short Term Goal 3 (Week 3): Pt will complete 3 sets of 15 resp of EMST and 3 sets of 10 reps of IMST Mod I and self-perceived effort of 8 out of 10. SLP Short Term Goal 4 (Week 3): Pt will incresae vocal intensity at the conversation level to convey abstract information in noisy environment to achive ~ 90% intelligibility with supervision level cues.  Skilled Therapeutic Interventions:  Skilled treatment session focused on communication and dysphagia goals. SLP facilitated session by providing conversational topics with pt being Mod I for use of speech intelligibility strategies ato > 95% intelligibility. SLP also provided skilled observation of pt consuming regular snack of peanut butter and graham crackers. Pt was free of overt s/s of aspiration or dysphagia. Plan created for pt to receive regular lunch tray with SLP to return for skilled observation and possible upgrade.   SLP returned for education with pt and his wife. Education provided on current diet with pt demonstrating good ability to consuming regular diet and good ability tot protect his airway when consuming thin liquids. Pt's speech intelligibility has improved and he is intelligible that the complex conversation level. Education provided for pt to continue completing RMT exercises  at discharge. Plan created for grad day from [employer] [facility] to be 09/24/19. All questions answered to pt and his wife's satisfaction.      Pain    Therapy/Group: Individual Therapy  Corday Wyka 09/23/2019, 2:49 PM

## 2019-09-24 ENCOUNTER — Ambulatory Visit (HOSPITAL_COMMUNITY): Payer: 59 | Admitting: Physical Therapy

## 2019-09-24 ENCOUNTER — Inpatient Hospital Stay (HOSPITAL_COMMUNITY): Payer: 59 | Admitting: Physical Therapy

## 2019-09-24 ENCOUNTER — Inpatient Hospital Stay (HOSPITAL_COMMUNITY): Payer: 59 | Admitting: Speech Pathology

## 2019-09-24 ENCOUNTER — Inpatient Hospital Stay (HOSPITAL_COMMUNITY): Payer: 59 | Admitting: Occupational Therapy

## 2019-09-24 NOTE — Progress Notes (Signed)
Occupational Therapy Session Note  Patient Details  Name: [name] MRN: [id number] Date of Birth: June 01, 1961  Today's Date: 09/24/2019 OT Individual Time: [id number] and [id number] OT Individual Time Calculation (min): 57 min and 20 min  Short Term Goals: Week 3:  OT Short Term Goal 1 (Week 3): STGs=LTGs due to ELOS  Skilled Therapeutic Interventions/Progress Updates:    Pt greeted in bed, planning to take his pain medicine at lunchtime prior to PT, ok right now for OT. He verified that he will be having a hospital bed and w/c for home. Pt able to manage hospital bed functions himself before transitioning EOB. Had him direct setup of w/c for squat pivot without wearing his B AFOs (completed with supervision). While sitting at the sink, pt engaged in oral care, hair washing, and hair drying with hair dryer. With vcs, pt able to incorporate Lt hand via active assist during most tasks. During the rest of these tasks, OT boosted him at elbow when he had to reach towards crown of head (I.e. putting on shampoo cap, lathering hair, drying hair). Pt aware how to direct spouse to assist Lt during functional activity as needed. He verbalized that he is good with HEPs for his wrist and hand. Has a handout + theraputty program that he's reviewed with therapy already. Provided him with a printout to address elbow and shoulder ROM via active assist. While guiding pt through the exercises on printout, he needed cues to decrease compensatory strategies. Also discussed modifications if he felt pain, though pt denied L UE pain during session. Recommended pt to apply hot modality before/post exercise for 10-15 minutes. He has enjoyed hot modalities in the past during ROM. Throughout session, reviewed OT recommendations for home, with pt able to demonstrate understanding using teach back. He reports he will complete B/D routine at his kitchen sink as MD has not yet cleared him to shower. Discussed waiting for HHOT to  review shower transfers/showering before attempting with family alone. He verbalized understanding. He also reported spouse had hands on practice with donning B AFOs and setting up w/c for transfers. At end of session pt completed squat pivot<bed with supervision assist. Able to lock w/c brake with Lt hand today for the first time! We celebrated! He remained comfortably in bed, waiting for RN to assist him with CPM machine. RN made aware of pts request. Left him with all needs within reach.   2nd Session 1:1 tx (20 min) Pt greeted in bed after his PT session, teary because he was proud of how well he did in therapy with family there to see! His spouse Theressa Stamps was still present. Reviewed his L UE HEPs and how to work on his Lt hand during functional tasks at home. Reiterated that he must complete B/D w/c level at the sink and to proceed with showering with HHOT once medically cleared to do so. Spouse verbalized understanding, asked some questions about how to wash his hair at the kitchen sink. She already bought him a urinal for nighttime toileting. Theressa Stamps stated she felt comfortable assisting pt with bilateral AFOs. OT wrote down information for her about how to purchase a sock aide so pt can don socks himself. Discussed pts CLOF and ability to direct care. Theressa Stamps verbalized understanding, had no other questions for OT. She feels comfortable providing the care he will need at d/c and had enough hands on mobility training with pt during PT. Left pt with family.    Therapy  Documentation Precautions:  Precautions Precaution Comments: colostomy, L UE WBAT (no longer needs splint), R LE WBAT (no longer needs CAM boot) as of 12/4 Restrictions Weight Bearing Restrictions: Yes LUE Weight Bearing: Weight bearing as tolerated RLE Weight Bearing: Weight bearing as tolerated ADL:     Therapy/Group: Individual Therapy  Nakul Avino A Kavari Parrillo 09/24/2019, 4:31 PM

## 2019-09-24 NOTE — Progress Notes (Signed)
Physical Therapy Weekly Progress Note  Patient Details  Name: [name] MRN: [id number] Date of Birth: [birth year]  Beginning of progress report period: September 16, 2019 End of progress report period: September 24, 2019  Today's Date: 09/24/2019 PT Individual Time: 1300-1338 and 1500-1600 PT Individual Time Calculation (min): 38 minand 60 min    Patient has met 4 of 4 short term goals.  Pt is making excellent progress towards LTG and is nearing goal level. Improved LUE and BLE strength as well as increased ROM in Bil knees near [facility] as allowed pt to progress to distant supervisionn assist to mod I bed mobility, supervision assist transfers and gait with Bil AFO and RW for short distances, min assist stair negotiation up to 4 steps supervision assist WC mobility   Patient continues to demonstrate the following deficits muscle weakness, muscle joint tightness and muscle paralysis, decreased cardiorespiratoy endurance, abnormal tone, unbalanced muscle activation, decreased coordination and decreased motor planning, . and decreased sitting balance, decreased standing balance, decreased postural control, hemiplegia and decreased balance strategies and therefore will continue to benefit from skilled PT intervention to increase functional independence with mobility.  Patient progressing toward long term goals..  Continue plan of care.  PT Short Term Goals Week 3:  PT Short Term Goal 1 (Week 3): Pt will consistently perform sit<>stand transfers with CGA from WC height of 21 inches. PT Short Term Goal 1 - Progress (Week 3): Met PT Short Term Goal 2 (Week 3): Pt will ambulate >3f with min assist and LRAD PT Short Term Goal 2 - Progress (Week 3): Met PT Short Term Goal 3 (Week 3): [facility] PT Short Term Goal 3 - Progress (Week 3): Met PT Short Term Goal 4 (Week 3): Pt will initiate stair management training with mod assist for 1, 3inch step. PT Short  Term Goal 4 - Progress (Week 3): Met Week 4:  PT Short Term Goal 1 (Week 4): STG=LTG due to ELOS  Skilled Therapeutic Interventions/Progress Updates:  Session 1  Pt received supine in bed and agreeable to PT. Supine>sit transfer without assist..PT donned Bil shoes and AFO. [facility]at [facility] from [facility]. Pt performed oral care at sink with partial set up only. [facility]. Gait training with Bil anterior support AFOs 2x [id number] bil posterior support AFOs. No change in gait pattern with anterior vs posterior supports, but significantly improved ability to don and doff with anterior supports. Pt returned to room and performed ambulatory  transfer to bed with supervision assist and RW. Sit>supine completed with out assist, and left supine in bed with call bell in reach and all needs met.    Session 2. Family present for education. Pt received supine in bed and agreeable to PT. Supine>sit transfer without assist. Stand pivot transfer to [facility] and supervision assist. WC mobility x [id number] supervision assist from PT with cues for safety through doorway.   Gait training in rehab gym 2 x [id number] supervision assist from PT and supervision assist from wife. Min cues for proper gaurding technique and how to cue to improve safety of turn.   Stair management training with mi nassist and 1 rail on the L. Cues for step to pattern to ascend with the LLE and descend with the RLE as tolerated. Performed x 2 with wife providing assist on second bout.  Car transfer training with supervision assist and cues for safety to sit>pivot and reduce risk of falling.   Gait training in simulated home environment over carpet x 9f with supervision assist and cues for increased step height to reduce risk of foot drag. Recliner transfer with CGA for initiation fo anterior weight shift to exit chair.   PT measured knee flexion. R: 111,  L117. Pt returned to room and performed stand pivot transfer to bed with RW and supervision assist. Sit>supine completed without assist,  and left supine in bed with call bell in reach and all needs met.            Therapy Documentation Precautions:  Precautions Precaution Comments: colostomy, L UE WBAT (no longer needs splint), R LE WBAT (no longer needs CAM boot) as of 12/4 Restrictions Weight Bearing Restrictions: Yes LUE Weight Bearing: Weight bearing as tolerated RLE Weight Bearing: Weight bearing as tolerated Pain:   denies  Therapy/Group: Individual Therapy  ALorie Phenix12/18/2020, 4:24 PM

## 2019-09-24 NOTE — Progress Notes (Signed)
Speech Language Pathology Discharge Summary  Patient Details  Name: [name] MRN: [id number] Date of Birth: 1961-05-23  Today's Date: 09/24/2019 SLP Individual Time: 0800-0827 SLP Individual Time Calculation (min): 27 min   Skilled Therapeutic Interventions:    Pt states that he feels "difference" when swallowing that is much improved over admission to CIR. Specifically, he "feels a difference when eating and drinking everything" then in the "beginning." SLP provided skilled observation of pt consuming regular breakfast and thin liquids. Pt managed regular bolus without any sensation of pharyngeal residue. Pt did attempt to speak with food in his mouth and began coughing. Pt able to manage coughing well and demonstrates good insight into need to cease talking while eating. Pt voices understanding that he is able to protect his airway while eating and states that he "isn't as fearful when eating as he was before." After brief coughing, pt consumed "cold cold water" via straw (4 oz) without overt s/s of aspiration. Pt states that he is excited to be able to drink "cold cold water" without coughing. Pt demonstrates increased speech intelligibility and reports that people no longer request verification of message over the phone. Pt is consistently producing voicing throughout conversation and don't feel that ENT appointment is needed. Education completed with wife and pt.    Patient has met 1 of 1 long term goals.  Patient to discharge at overall Independent;Modified Independent level.  Reasons goals not met:   N/A  Clinical Impression/Discharge Summary:   Pt has made good progress in skilled ST sessions and as a result he has met 2 of 2 LTGs and is currently Mod I to Independent for goals targeting dysphagia with regular diet and thin liquids as well as speech intelligibility. Pt has participated in EMST and IMST to increase speech intelligibility at the conversation level and to increase cough  strength. Additionally, pt participated in pharyngeal strengthening exercises that have helped reduce s/s of pharyngeal residue with regular diet and thin liquids. Pt's knowledge of aspiration precautions as been demonstrated via teach back. All education has been completed with wife and pt. No further ST services at indicated at this time and no follow up ST or ENT services appear indicated.   Care Partner: N/A       Recommendation:  None      Equipment:   N/A  Reasons for discharge: Treatment goals met   Patient/Family Agrees with Progress Made and Goals Achieved: Yes    [name] 09/24/2019, 8:19 AM

## 2019-09-24 NOTE — Progress Notes (Signed)
[place] PHYSICAL MEDICINE & REHABILITATION PROGRESS NOTE   Subjective/Complaints:   No sig knee pain Wife has been educated on changing ileostomy bag  No bladder issues per Nursing Working with SLP still on soft diet   ROS: Patient denies , nausea, vomiting, diarrhea, cough, shortness of breath or chest pain, joint or back pain,    Objective:   No results found. No results for input(s): WBC, HGB, HCT, PLT in the last 72 hours. No results for input(s): NA, K, CL, CO2, GLUCOSE, BUN, CREATININE, CALCIUM in the last 72 hours.  Intake/Output Summary (Last 24 hours) at 09/24/2019 0815 Last data filed at 09/24/2019 0545 Gross per 24 hour  Intake 120 ml  Output 1200 ml  Net -1080 ml     Physical Exam: Vital Signs Blood pressure 130/83, pulse 72, temperature (!) 97.5 F (36.4 C), temperature source Oral, resp. rate 16, weight 91.6 kg, SpO2 99 %. Constitutional: No distress . Vital signs reviewed. HEENT: EOMI, oral membranes moist Neck: supple Cardiovascular: RRR without murmur. No JVD    Respiratory: CTA Bilaterally without wheezes or rales. Normal effort    GI: BS +, non-tender, non-distended. Ostomy intact. Granulating abdominal wound Musculoskeletal:     Comments:   Knee ROM around 90 deg+ + right  knee tenderness, no effusion  + left shoulder tenderness. Incision C/D/I   Neurological: He is alert and oriented to person, place, and time.  Motor: Right upper extremity: 4 -/5 proximal distal Left upper extremity: Shoulder abduction, elbow flexion/extension 3- /5, handgrip trace  right lower extremity is in the CPM and cannot be tested. Left lower extremity has 3 - at the hip flexors knee extensors 4 - and ankle dorsiflexors 3 - motor exam stable Sensation intact light touch Dysarthric Left facial weakness ongoing   Psychiatric: Flat no lability or agitation   Assessment/Plan: 1. Functional deficits secondary to acute brainstem infarct which require 3+ hours per day  of interdisciplinary therapy in a comprehensive inpatient rehab setting.  Physiatrist is providing close team supervision and 24 hour management of active medical problems listed below.  Physiatrist and rehab team continue to assess barriers to discharge/monitor patient progress toward functional and medical goals  Care Tool:  Bathing    Body parts bathed by patient: Left arm, Chest, Abdomen, Face, Right upper leg, Left upper leg, Right arm, Front perineal area, Buttocks   Body parts bathed by helper: Right lower leg, Left lower leg     Bathing assist Assist Level: Minimal Assistance - Patient > 75%     Upper Body Dressing/Undressing Upper body dressing   What is the patient wearing?: Pull over shirt    Upper body assist Assist Level: Set up assist    Lower Body Dressing/Undressing Lower body dressing      What is the patient wearing?: Pants     Lower body assist Assist for lower body dressing: Minimal Assistance - Patient > 75%     Toileting Toileting    Toileting assist Assist for toileting: Minimal Assistance - Patient > 75% Assistive Device Comment: urinal   Transfers Chair/bed transfer  Transfers assist     Chair/bed transfer assist level: Supervision/Verbal cueing     Locomotion Ambulation   Ambulation assist      Assist level: Supervision/Verbal cueing Assistive device: Walker-rolling Max distance: 45   Walk 10 feet activity   Assist     Assist level: Supervision/Verbal cueing Assistive device: Walker-rolling   Walk 50 feet activity   Assist Walk  50 feet with 2 turns activity did not occur: Safety/medical concerns  Assist level: Minimal Assistance - Patient > 75%      Walk 150 feet activity   Assist Walk 150 feet activity did not occur: Safety/medical concerns         Walk 10 feet on uneven surface  activity   Assist Walk 10 feet on uneven surfaces activity did not occur: Safety/medical concerns          Wheelchair     Assist Will patient use wheelchair at discharge?: Yes Type of Wheelchair: Manual    Wheelchair assist level: Supervision/Verbal cueing Max wheelchair distance: 200    Wheelchair 50 feet with 2 turns activity    Assist        Assist Level: Supervision/Verbal cueing   Wheelchair 150 feet activity     Assist      Assist Level: Supervision/Verbal cueing   Blood pressure 130/83, pulse 72, temperature (!) 97.5 F (36.4 C), temperature source Oral, resp. rate 16, weight 91.6 kg, SpO2 99 %.  Medical Problem List and Plan: 1.  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             -ELOS 12/22            CIR Level PT , OT , SLP -       -continue CPM 2.  Antithrombotics: -DVT/anticoagulation:  Pharmaceutical: lovenox 40mg  daily  doppler neg for DVT              -antiplatelet therapy: ASA 3. Pain Management: tylenol for mild and oxycodone for severe pain.              Monitor with increased mobility Doesn't like how pain meds make him feel   12/8: For right knee pain, will add diclofenac gel. For left shoulder pain, will add Lidocaine patch, will ask Ortho to eval effusion, may need arthrocentesis will contact4. Mood: LCSW to follow for support and evaluation. Team to provide ego support             -antipsychotic agents: N/A.  5. Neuropsych: This patient is capable of making decisions on his own behalf. 6. Skin/Wound Care: routine pressure relief measures., abd wound healing  Miliarsia- likely from heat related to overlay, changed benadryl to calamine  -remove sutures right hip 7. Fluids/Electrolytes/Nutrition: Monitor I/O. Encourage intake--he's afraid to eat due to concerns of return of abdominal pain.off TF     12/7 Na 131,129 on 12/8 , 131 on 12/14  8. Right pontine stroke: On ASA for secondary stroke prevention.  9. Mesenteric injuries s/p Ileocecectomy, total colectomy and  repair of lacerations: Foley to collect high volume out of ileostomy."liquidy stools expected given lack of lg colon  Lomotil prn  12/13 -asked pt to d/w RN re: ostomy to make sure they are using the type of seal,bag which works for him 10. Fistula/leak/abscess: completed  Zosyn 11/15-->11/29. 12. Situational depression with insomnia: Continue Seroquel at nights.   13. Right knee pain: Question MRI for workup.  14. ORIF left clavicle and left distal radius fracture: NWB left wrist--may WB thorough elbow with platform walker. 15.  IM nailing right femur and ORIF right bimalleolar Fx: .  16. Anemia:likely ABLA 10.1 on 12/14 recheck 12/21 17.  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 He has CPM ordered bilaterally , was ranged under  GA to 125 deg, patient increased rapidly on the right side and has soreness related to this.  On the left side he is  up to 120 degrees. Right knee to 105 degrees .  Cont CPM slowly advancing to goals  .  Continue aggressive rehab efforts  18.  Left MCP, PIP and DIP contracture- extention, will ask OT to perform passive flexion of fingers   19.  Papular rash upper chest and thighs, miliaria-  calamine working well    20.  Dysphagia post stroke, d/w SLP should have normal sensation due to pontine location, may have anxiety component   LOS: 14 days A FACE TO FACE EVALUATION WAS PERFORMED  Charlett Blake 09/24/2019, 8:15 AM

## 2019-09-24 NOTE — Progress Notes (Signed)
Social Work Patient ID: [name], male   DOB: [birth year], 58 y.o.   MRN: [id number] Wife and brother in-law here for education with PT session in preparation for discharge Tuesday. Equipment ordered and follow up arrangements made.

## 2019-09-24 NOTE — Progress Notes (Signed)
Ortho trauma progress Note  Swelling and pain in right knee improving. Has  Increased knee motion to 105 degrees with CPM. Left knee doing well, up to 120 degrees on CPM. Excited to be discharged next week.   Physical Exam: RLE -  Medial tenderness, improved swelling. No effusion. Tolerates about 90-100 degrees of flexion. Unable to dorsiflex ankle. Straight leg raise intact LLE - Non-tender, no effusion. Knee flexion to 120 degrees. Weak ankle dorsiflexion. Sensation intact. Neurovascularly intact  Assessment/Plan: 58 yo male s/p multitrauma with bilateral knee flexion contractures  Continue oral anti-inflammatories (Naproxen 500mg  BID) for knee swelling Modalities PRN including ICE and heat Continue CPM and ROM as tolerated. No restrictions Will plan to see patient in OTS clinic 2 weeks after discharge  Kearney Evitt A. [facility] [phone] (office) [email or web address]

## 2019-09-24 NOTE — Consult Note (Signed)
[place] Nurse ostomy follow up Stoma type/location:  RUQ, ileostomy Stomal assessment/size: pink, moist, flush with skin  Peristomal assessment: NA Treatment options for stomal/peristomal skin: 2" barrier ring Output liquid green stool Ostomy pouching: 1pc.convex pouch intact from last pouch per wife yesterday 09/23/19 Education provided:  Emailed to patient's wife supplies, item numbers she has made contact with [employer] for order She is independent with ostomy care and pouch change.  Marland Kitchen  1pc flex convex and 2" barrier rings in patient room/use/DC Enrolled patient in [facility] Discharge program: Yes  [place], [place], [place]

## 2019-09-25 ENCOUNTER — Inpatient Hospital Stay (HOSPITAL_COMMUNITY): Payer: 59 | Admitting: Physical Therapy

## 2019-09-25 DIAGNOSIS — T07XXXA Unspecified multiple injuries, initial encounter: Secondary | ICD-10-CM

## 2019-09-25 DIAGNOSIS — I69391 Dysphagia following cerebral infarction: Secondary | ICD-10-CM

## 2019-09-25 DIAGNOSIS — E871 Hypo-osmolality and hyponatremia: Secondary | ICD-10-CM

## 2019-09-25 DIAGNOSIS — D62 Acute posthemorrhagic anemia: Secondary | ICD-10-CM

## 2019-09-25 NOTE — Progress Notes (Signed)
Physical Therapy Session Note  Patient Details  Name: [name] MRN: [id number] Date of Birth: 1961/07/24  Today's Date: 09/25/2019 PT Individual Time: 8527-7824 PT Individual Time Calculation (min): 57 min   Short Term Goals: Week 4:  PT Short Term Goal 1 (Week 4): STG=LTG due to ELOS  Skilled Therapeutic Interventions/Progress Updates:   Pt received sitting in recliner and agreeable to PT. Stand pivot transfer to [facility] wth supervision assist and min cues for anterior weight shift from low seat height. [name] present for additional education. Stair management training with CGA up/4 4 steps (6") with 1 rail L. Cues for step to gait pattern and proper LE placement on step to reduce fall risk. Gait training through rehab gym with RW 2 x 64f with prolonged rest break following each bout. Car transfer training with supervision assist provided by wife for set up and safe AD management. [name] able to appropriate instruct pt throughout transfer. WC mobility x 1524fwith distant supervision assist and only occasional cues for symmetry of RUE and LUE to maintain straight path in hall. Nustep reciprocal movement training to force AAROM in BLE up to ~90deg. Pt reports no pain throughout 5 min + 3 min with 1 therapeutic rest break. Pt returned to room and performed stand pivot transfer to bed with supervision assist and RW. Sit>supine completed without assist, and left supine in bed with call bell in reach and all needs met.        Therapy Documentation Precautions:  Precautions Precaution Comments: colostomy, L UE WBAT (no longer needs splint), R LE WBAT (no longer needs CAM boot) as of 12/4 Restrictions Weight Bearing Restrictions: Yes LUE Weight Bearing: Weight bearing as tolerated RLE Weight Bearing: Weight bearing as tolerated   Pain:   denies throughout    Therapy/Group: Individual Therapy  AuLorie Phenix2/19/2020, 5:32 PM

## 2019-09-25 NOTE — Progress Notes (Signed)
[place] PHYSICAL MEDICINE & REHABILITATION PROGRESS NOTE   Subjective/Complaints: Patient seen sitting up in his chair eating breakfast this morning.  He states he slept well overnight, confirmed with sleep chart.  He asks for the timing of his Ambien to be changed.  ROS: Denies CP, SOB, N/V/D  Objective:   No results found. No results for input(s): WBC, HGB, HCT, PLT in the last 72 hours. No results for input(s): NA, K, CL, CO2, GLUCOSE, BUN, CREATININE, CALCIUM in the last 72 hours.  Intake/Output Summary (Last 24 hours) at 09/25/2019 1834 Last data filed at 09/25/2019 1300 Gross per 24 hour  Intake 440 ml  Output 1050 ml  Net -610 ml     Physical Exam: Vital Signs Blood pressure 109/84, pulse (!) 59, temperature 97.7 F (36.5 C), temperature source Oral, resp. rate 16, weight 91.2 kg, SpO2 100 %. Constitutional: No distress . Vital signs reviewed. HENT: Normocephalic.  Atraumatic. Eyes: EOMI. No discharge. Cardiovascular: No JVD. Respiratory: Normal effort.  No stridor. GI: Non-distended.  + Ileostomy. Skin: Warm and dry.  Intact. Psych: Flat. Musc: Right knee tenderness Neurological: Alert Motor: Right upper extremity: 4 -/5 proximal distal, unchanged Left upper extremity: Shoulder abduction, elbow flexion/extension 2+/5, handgrip trace (some pain inhibition) Left lower extremity: Hip flexion, knee extension 2+/5, ankle dorsiflexion 1/5 Dysarthria  Left facial weakness  Assessment/Plan: 1. Functional deficits secondary to acute brainstem infarct which require 3+ hours per day of interdisciplinary therapy in a comprehensive inpatient rehab setting.  Physiatrist is providing close team supervision and 24 hour management of active medical problems listed below.  Physiatrist and rehab team continue to assess barriers to discharge/monitor patient progress toward functional and medical goals  Care Tool:  Bathing  Bathing activity did not occur: Refused Body  parts bathed by patient: Left arm, Chest, Abdomen, Face, Right upper leg, Left upper leg, Right arm, Front perineal area, Buttocks   Body parts bathed by helper: Right lower leg, Left lower leg     Bathing assist Assist Level: Minimal Assistance - Patient > 75%     Upper Body Dressing/Undressing Upper body dressing   What is the patient wearing?: Pull over shirt    Upper body assist Assist Level: Set up assist    Lower Body Dressing/Undressing Lower body dressing      What is the patient wearing?: Pants     Lower body assist Assist for lower body dressing: Minimal Assistance - Patient > 75%     Toileting Toileting    Toileting assist Assist for toileting: Minimal Assistance - Patient > 75% Assistive Device Comment: urinal   Transfers Chair/bed transfer  Transfers assist     Chair/bed transfer assist level: Supervision/Verbal cueing     Locomotion Ambulation   Ambulation assist      Assist level: Supervision/Verbal cueing Assistive device: Walker-rolling Max distance: 30   Walk 10 feet activity   Assist     Assist level: Supervision/Verbal cueing Assistive device: Walker-rolling   Walk 50 feet activity   Assist Walk 50 feet with 2 turns activity did not occur: Safety/medical concerns  Assist level: Minimal Assistance - Patient > 75%      Walk 150 feet activity   Assist Walk 150 feet activity did not occur: Safety/medical concerns         Walk 10 feet on uneven surface  activity   Assist Walk 10 feet on uneven surfaces activity did not occur: Safety/medical concerns         Wheelchair  Assist Will patient use wheelchair at discharge?: Yes Type of Wheelchair: Manual    Wheelchair assist level: Supervision/Verbal cueing Max wheelchair distance: 150    Wheelchair 50 feet with 2 turns activity    Assist        Assist Level: Supervision/Verbal cueing   Wheelchair 150 feet activity     Assist       Assist Level: Supervision/Verbal cueing   Blood pressure 109/84, pulse (!) 59, temperature 97.7 F (36.5 C), temperature source Oral, resp. rate 16, weight 91.2 kg, SpO2 100 %.  Medical Problem List and Plan: 1.  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  Continue CIR  -continue CPM 2.  Antithrombotics: -DVT/anticoagulation:  Pharmaceutical: lovenox 40mg  daily  doppler neg for DVT              -antiplatelet therapy: ASA 3. Pain Management: tylenol for mild and oxycodone for severe pain.              Monitor with increased mobility  Voltaren gel for right knee, Lidoderm patch for left shoulder   Controlled on 12/19 4. Mood: LCSW to follow for support and evaluation. Team to provide ego support             -antipsychotic agents: N/A.  5. Neuropsych: This patient is capable of making decisions on his own behalf. 6. Skin/Wound Care: routine pressure relief measures., abd wound healing, likely from heat related to overlay, changed benadryl to calamine 7. Fluids/Electrolytes/Nutrition: Monitor I/O. Encourage intake 8. Right pontine stroke: On ASA for secondary stroke prevention.  9. Mesenteric injuries s/p Ileocecectomy, total colectomy and repair of lacerations: Foley to collect high volume out of ileostomy."liquidy stools expected given lack of lg colon  Lomotil prn 10. Fistula/leak/abscess: completed  Zosyn 11/15-->11/29. 12. Situational depression with insomnia: Continue Seroquel at nights.    Improving 13. Right knee pain: Question MRI for workup, does not appear necessary at this time.  14. ORIF left clavicle and left distal radius fracture: NWB left wrist--may WB thorough elbow with platform walker. 15.  IM nailing right femur and ORIF right bimalleolar Fx: .  16. Anemia:likely ABLA  Hemoglobin 10.1 on 12/14, labs ordered for Monday 17.  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 He has CPM ordered bilaterally  Cont CPM slowly advancing to goals    Continue aggressive rehab efforts  18.  Left MCP, PIP and DIP contracture- extention, therapies 19.  Papular rash upper chest and thighs, miliaria-  calamine working well    20.  Dysphagia post stroke  Now on regular diet thin liquids with medicine in pure 21.  Hyponatremia  Sodium 131 on 12/14, labs ordered for Monday   LOS: 15 days A FACE TO FACE EVALUATION WAS PERFORMED  Damyra Luscher [name] 09/25/2019, 6:34 PM

## 2019-09-26 ENCOUNTER — Inpatient Hospital Stay (HOSPITAL_COMMUNITY): Payer: 59 | Admitting: Physical Therapy

## 2019-09-26 DIAGNOSIS — G479 Sleep disorder, unspecified: Secondary | ICD-10-CM

## 2019-09-26 MED ORDER — ZOLPIDEM TARTRATE 5 MG PO TABS
5.0000 mg | ORAL_TABLET | Freq: Every day | ORAL | Status: DC
  Administered 2019-09-26 – 2019-09-27 (×2): 5 mg via ORAL
  Filled 2019-09-26 (×2): qty 1

## 2019-09-26 NOTE — Progress Notes (Signed)
[place] PHYSICAL MEDICINE & REHABILITATION PROGRESS NOTE   Subjective/Complaints: Patient seen laying in bed this AM.  He states he slept well overnight, confirmed with sleep chart. He denies complaints.   ROS: Denies CP, SOB, N/V/D  Objective:   No results found. No results for input(s): WBC, HGB, HCT, PLT in the last 72 hours. No results for input(s): NA, K, CL, CO2, GLUCOSE, BUN, CREATININE, CALCIUM in the last 72 hours.  Intake/Output Summary (Last 24 hours) at 09/26/2019 1411 Last data filed at 09/26/2019 0900 Gross per 24 hour  Intake 422 ml  Output 575 ml  Net -153 ml     Physical Exam: Vital Signs Blood pressure 126/85, pulse 92, temperature 98.5 F (36.9 C), temperature source Oral, resp. rate 18, weight 92.1 kg, SpO2 100 %. Constitutional: No distress . Vital signs reviewed. HENT: Normocephalic.  Atraumatic. Eyes: EOMI. No discharge. Cardiovascular: No JVD. Respiratory: Normal effort.  No stridor. GI: Non-distended. +Ileostomy. Skin: Warm and dry.  Intact. Psych: Flat.  Musc: Right knee tenderness Neurological: Alert Motor: Right upper extremity: 4 -/5 proximal distal Left upper extremity: Shoulder abduction, elbow flexion/extension 2+/5, handgrip trace (some pain inhibition), unchanged Left lower extremity: Hip flexion, knee extension 2+/5, ankle dorsiflexion 1/5, unchanged Dysarthria  Left facial weakness  Assessment/Plan: 1. Functional deficits secondary to acute brainstem infarct which require 3+ hours per day of interdisciplinary therapy in a comprehensive inpatient rehab setting.  Physiatrist is providing close team supervision and 24 hour management of active medical problems listed below.  Physiatrist and rehab team continue to assess barriers to discharge/monitor patient progress toward functional and medical goals  Care Tool:  Bathing  Bathing activity did not occur: Refused Body parts bathed by patient: Left arm, Chest, Abdomen, Face,  Right upper leg, Left upper leg, Right arm, Front perineal area, Buttocks   Body parts bathed by helper: Right lower leg, Left lower leg     Bathing assist Assist Level: Minimal Assistance - Patient > 75%     Upper Body Dressing/Undressing Upper body dressing   What is the patient wearing?: Pull over shirt    Upper body assist Assist Level: Set up assist    Lower Body Dressing/Undressing Lower body dressing      What is the patient wearing?: Pants     Lower body assist Assist for lower body dressing: Minimal Assistance - Patient > 75%     Toileting Toileting    Toileting assist Assist for toileting: Minimal Assistance - Patient > 75% Assistive Device Comment: urinal   Transfers Chair/bed transfer  Transfers assist     Chair/bed transfer assist level: Supervision/Verbal cueing     Locomotion Ambulation   Ambulation assist      Assist level: Supervision/Verbal cueing Assistive device: Walker-rolling Max distance: 30   Walk 10 feet activity   Assist     Assist level: Supervision/Verbal cueing Assistive device: Walker-rolling   Walk 50 feet activity   Assist Walk 50 feet with 2 turns activity did not occur: Safety/medical concerns  Assist level: Minimal Assistance - Patient > 75%      Walk 150 feet activity   Assist Walk 150 feet activity did not occur: Safety/medical concerns         Walk 10 feet on uneven surface  activity   Assist Walk 10 feet on uneven surfaces activity did not occur: Safety/medical concerns         Wheelchair     Assist Will patient use wheelchair at discharge?: Yes Type  of Wheelchair: Manual    Wheelchair assist level: Supervision/Verbal cueing Max wheelchair distance: 150    Wheelchair 50 feet with 2 turns activity    Assist        Assist Level: Supervision/Verbal cueing   Wheelchair 150 feet activity     Assist      Assist Level: Supervision/Verbal cueing   Blood pressure  126/85, pulse 92, temperature 98.5 F (36.9 C), temperature source Oral, resp. rate 18, weight 92.1 kg, SpO2 100 %.  Medical Problem List and Plan: 1.  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  Continue CIR  -continue CPM 2.  Antithrombotics: -DVT/anticoagulation:  Pharmaceutical: lovenox 40mg  daily  doppler neg for DVT              -antiplatelet therapy: ASA 3. Pain Management: tylenol for mild and oxycodone for severe pain.              Monitor with increased mobility  Voltaren gel for right knee, Lidoderm patch for left shoulder   Controlled on 12/20 4. Mood: LCSW to follow for support and evaluation. Team to provide ego support             -antipsychotic agents: N/A.  5. Neuropsych: This patient is capable of making decisions on his own behalf. 6. Skin/Wound Care: routine pressure relief measures., abd wound healing, likely from heat related to overlay, changed benadryl to calamine 7. Fluids/Electrolytes/Nutrition: Monitor I/O. Encourage intake 8. Right pontine stroke: On ASA for secondary stroke prevention.  9. Mesenteric injuries s/p Ileocecectomy, total colectomy and repair of lacerations: Foley to collect high volume out of ileostomy."liquidy stools expected given lack of lg colon  Lomotil prn 10. Fistula/leak/abscess: completed  Zosyn 11/15-->11/29. 12. Situational depression with insomnia: Continue Seroquel at nights.    Ambien timing adjusted per patient preference  13. Right knee pain: Question MRI for workup, does not appear necessary at this time.  14. ORIF left clavicle and left distal radius fracture: NWB left wrist--may WB thorough elbow with platform walker. 15.  IM nailing right femur and ORIF right bimalleolar Fx: .  16. Anemia:likely ABLA  Hemoglobin 10.1 on 12/14, labs ordered for tomorrow 17.  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 He has CPM ordered bilaterally  Cont CPM slowly advancing to goals    Continue aggressive rehab efforts  18.  Left MCP, PIP and DIP contracture- extention, therapies 19.  Papular rash upper chest and thighs, miliaria-  calamine working well    20.  Dysphagia post stroke  Now on regular diet thin liquids with medicine in pure 21.  Hyponatremia  Sodium 131 on 12/14, labs ordered for tomorrow  LOS: 16 days A FACE TO FACE EVALUATION WAS PERFORMED  Alyha Marines [name] 09/26/2019, 2:11 PM

## 2019-09-26 NOTE — Progress Notes (Signed)
Physical Therapy Session Note  Patient Details  Name: [name] MRN: [id number] Date of Birth: 1961-04-06  Today's Date: 09/26/2019 PT Individual Time: 6226-3335 PT Individual Time Calculation (min): 31 min   Short Term Goals: Week 4:  PT Short Term Goal 1 (Week 4): STG=LTG due to ELOS  Skilled Therapeutic Interventions/Progress Updates: Pt presented in recliner with wife present agreeable to therapy. Discussed upcoming d/c and status of pt's progress. Pt would like to try bed mobility on standard bed as may consider not getting hospital bed. Pt performed ambulatory transfer to w/c with RW and close S for transfer and gait. Pt transported to ADL apt and pt demonstrated ambulatory transfer to bed and transferred sit to/from supine with bed flat. Pt pleased with progress and asked if we can cancel hospital bed. Pt returned to w/c in same manner as prior and transported to rehab gym. Pt and wife provided verbal instruction to PTA on stair negotiation and they were also able to demonstrate ascending/descending x 8 steps with CGA from wife with wife providing appropriate verbal cues when needed. PTA provided education on energy conservation and initially minimize amount of times pt/wife perform stairs upon d/c. Pt and wife verbalized understanding. Pt transported back to room at end of session and performed ambulatory transfer to recliner in same manner as prior. Pt left in recliner with call bell within reach and needs met.      Therapy Documentation Precautions:  Precautions Precaution Comments: colostomy, L UE WBAT (no longer needs splint), R LE WBAT (no longer needs CAM boot) as of 12/4 Restrictions Weight Bearing Restrictions: No LUE Weight Bearing: Weight bearing as tolerated RLE Weight Bearing: Weight bearing as tolerated General:   Vital Signs: Therapy Vitals Temp: 98 F (36.7 C) Temp Source: Oral Pulse Rate: 83 Resp: 18 BP: (!) 115/100 Patient Position (if appropriate):  Lying Oxygen Therapy SpO2: 99 % O2 Device: Room Air    Therapy/Group: Individual Therapy  Jessicah Croll 09/26/2019, 4:10 PM

## 2019-09-26 NOTE — Plan of Care (Signed)
  Problem: Consults Goal: RH STROKE PATIENT EDUCATION Description: See Patient Education module for education specifics  Outcome: Progressing   Problem: RH BOWEL ELIMINATION Goal: RH STG MANAGE BOWEL WITH ASSISTANCE Description: STG Manage Bowel with mod Assistance. Outcome: Progressing Goal: RH STG MANAGE BOWEL W/MEDICATION W/ASSISTANCE Description: STG Manage Bowel with Medication with mod I Assistance. Outcome: Progressing   Problem: RH BLADDER ELIMINATION Goal: RH STG MANAGE BLADDER WITH ASSISTANCE Description: STG Manage Bladder With min Assistance Outcome: Progressing   Problem: RH SKIN INTEGRITY Goal: RH STG SKIN FREE OF INFECTION/BREAKDOWN Description: Patients skin will remain free from further infection or breakdown with min assist. Outcome: Progressing Goal: RH STG MAINTAIN SKIN INTEGRITY WITH ASSISTANCE Description: STG Maintain Skin Integrity With min Assistance. Outcome: Progressing Goal: RH STG ABLE TO PERFORM INCISION/WOUND CARE W/ASSISTANCE Description: STG Able To Perform Incision/Wound Care With mod Assistance. Outcome: Progressing   Problem: RH SAFETY Goal: RH STG ADHERE TO SAFETY PRECAUTIONS W/ASSISTANCE/DEVICE Description: STG Adhere to Safety Precautions With mod I Assistance/Device. Outcome: Progressing   Problem: RH PAIN MANAGEMENT Goal: RH STG PAIN MANAGED AT OR BELOW PT'S PAIN GOAL Description: < 4 Outcome: Progressing   Problem: RH KNOWLEDGE DEFICIT Goal: RH STG INCREASE KNOWLEDGE OF DYSPHAGIA/FLUID INTAKE Description: Min assist Outcome: Progressing Goal: RH STG INCREASE KNOWLEDGE OF STROKE PROPHYLAXIS Description: Patient/caregiver will verbalize understanding of stroke prophylaxis including monitoring, diet, exercise, and medications with min assist. Outcome: Progressing

## 2019-09-26 NOTE — Progress Notes (Signed)
Physical Therapy Discharge Summary  Patient Details  Name: [name] MRN: [id number] Date of Birth: 09-03-1961  Today's Date: 09/27/2019 PT Individual Time: 0300-9233 PT Individual Time Calculation (min): 60 min  Session focused on grad day activities and preparation for discharge. Pt performs basic transfers with RW throughout session with overall supervision with verbal cues for reminders for LUE management off of RW prior to sitting down. Stair negotiation training with L rail with pt directing technique at overall CGA level ascending forward and descending backwards. Gait up/down ramp for home access practice and uneven surface negotiation at close supervision level with RW with good control noted. Gait training with RW x 50' with supervision for safety for functional mobility training. Endurance limiting distance. Pt with antalgic gait, decreased stance time on L, R foot inverted, and narrow BOS. Issued HEP and pt able to demonstrate about 3 reps on each side of each exercise to be able to provide feedback on technique including LAQ, standing hip abduction, standing hamstring curls, and standing heel raises (and recommended seated ankle DF as able to attempt). End of session transferred back to bed with supervision and performed bed mobility modified independent.    Patient has met 11 of 11 long term goals due to improved activity tolerance, improved balance, increased strength, decreased pain, ability to compensate for deficits, functional use of  left upper extremity and left lower extremity, improved awareness and improved coordination.  Patient to discharge at short distance/household ambulatory level with RW supervision, modified independent w/c mobility, and CGA for stair negotiation.   Patient's care partner is independent to provide the necessary physical and supervision assistance at discharge. Family has participated in family education.  Reasons goals not met: n/a all goals met at  this time  Recommendation:  Patient will benefit from ongoing skilled PT services in home health setting to continue to advance safe functional mobility, address ongoing impairments in strength, balance, endurance, functional mobility, and minimize fall risk.  Equipment: RW and w/c  Reasons for discharge: treatment goals met and discharge from hospital  Patient/family agrees with progress made and goals achieved: Yes  PT Discharge Precautions/Restrictions Precautions Precaution Comments: colostomy, L UE WBAT (no longer needs splint), R LE WBAT (no longer needs CAM boot) as of 12/4 Restrictions Weight Bearing Restrictions: No LUE Weight Bearing: Weight bearing as tolerated RLE Weight Bearing: Weight bearing as tolerated Pain  No complaints during session of pain, just fatigue. Cognition Overall Cognitive Status: Within Functional Limits for tasks assessed Arousal/Alertness: Awake/alert Orientation Level: Oriented X4 Sensation Sensation Light Touch: Appears Intact Motor  Motor Motor: Hemiplegia Motor - Discharge Observations: L hemiplegia present but improved  Mobility Bed Mobility Rolling Right: Independent with assistive device Supine to Sit: Supervision/Verbal cueing Sit to Supine: Supervision/Verbal cueing Transfers Transfers: Stand to Sit;Sit to Stand;Stand Pivot Transfers Sit to Stand: Supervision/Verbal cueing Stand to Sit: Supervision/Verbal cueing Stand Pivot Transfers: Supervision/Verbal cueing Locomotion  Gait Ambulation: Yes Gait Assistance: Supervision/Verbal cueing Gait Distance (Feet): 50 Feet Assistive device: Rolling walker(bilateral AFOs) Stairs / Additional Locomotion Stairs: Yes Stairs Assistance: Contact Guard/Touching assist Stair Management Technique: One rail Left;Step to pattern Number of Stairs: 4 Height of Stairs: 6 Wheelchair Mobility Wheelchair Mobility: Yes Wheelchair Assistance: Independent with Garment/textile technologist: Both upper extremities Distance: 150  Trunk/Postural Assessment  Cervical Assessment Cervical Assessment: Within Functional Limits Thoracic Assessment Thoracic Assessment: Within Functional Limits Lumbar Assessment Lumbar Assessment: Exceptions to [facility] Postural Control Postural Control: Within Functional Limits  Balance Balance Balance  Assessed: Yes Static Sitting Balance Static Sitting - Balance Support: Feet supported Static Sitting - Level of Assistance: 6: Modified independent (Device/Increase time) Dynamic Sitting Balance Dynamic Sitting - Balance Support: No upper extremity supported;During functional activity Dynamic Sitting - Level of Assistance: 6: Modified independent (Device/Increase time) Dynamic Sitting - Balance Activities: Reaching for objects;Forward lean/weight shifting;Lateral lean/weight shifting Static Standing Balance Static Standing - Balance Support: Bilateral upper extremity supported Static Standing - Level of Assistance: 5: Stand by assistance Dynamic Standing Balance Dynamic Standing - Balance Support: Bilateral upper extremity supported Dynamic Standing - Level of Assistance: 5: Stand by assistance Extremity Assessment    see OT d/c summary for UE assessment. Mild L hemiplegia   RLE Assessment RLE Assessment: Exceptions to [facility] General Strength Comments: hip and knee flexion 4-/5; 3+/5 knee extension; did not formally test ankle DF but wearing AFO's due to foot drop LLE Assessment LLE Assessment: Exceptions to [facility] General Strength Comments: grossly 3+ to 4-/5 except ankle DF wearing AFO's due to foot drop    Rosita DeChalus 09/26/2019, 4:16 PM   Lars Masson, PT, DPT, CBIS 09/27/19 11:55 AM

## 2019-09-27 ENCOUNTER — Inpatient Hospital Stay (HOSPITAL_COMMUNITY): Payer: 59 | Admitting: Occupational Therapy

## 2019-09-27 ENCOUNTER — Inpatient Hospital Stay (HOSPITAL_COMMUNITY): Payer: 59

## 2019-09-27 DIAGNOSIS — D75839 Thrombocytosis, unspecified: Secondary | ICD-10-CM

## 2019-09-27 DIAGNOSIS — D473 Essential (hemorrhagic) thrombocythemia: Secondary | ICD-10-CM

## 2019-09-27 LAB — BASIC METABOLIC PANEL
Anion gap: 12 (ref 5–15)
BUN: 37 mg/dL — ABNORMAL HIGH (ref 6–20)
CO2: 21 mmol/L — ABNORMAL LOW (ref 22–32)
Calcium: 10.4 mg/dL — ABNORMAL HIGH (ref 8.9–10.3)
Chloride: 99 mmol/L (ref 98–111)
Creatinine, Ser: 1.14 mg/dL (ref 0.61–1.24)
GFR calc Af Amer: >60 mL/min (ref >60)
GFR calc non Af Amer: >60 mL/min (ref >60)
Glucose, Bld: 92 mg/dL (ref 70–99)
Potassium: 4.5 mmol/L (ref 3.5–5.1)
Sodium: 132 mmol/L — ABNORMAL LOW (ref 135–145)

## 2019-09-27 LAB — CBC WITH DIFFERENTIAL/PLATELET
Abs Immature Granulocytes: 0.15 10*3/uL — ABNORMAL HIGH (ref 0.00–0.07)
Basophils Absolute: 0.1 10*3/uL (ref 0.0–0.1)
Basophils Relative: 1 %
Eosinophils Absolute: 0.2 10*3/uL (ref 0.0–0.5)
Eosinophils Relative: 2 %
HCT: 33 % — ABNORMAL LOW (ref 39.0–52.0)
Hemoglobin: 10.6 g/dL — ABNORMAL LOW (ref 13.0–17.0)
Immature Granulocytes: 2 %
Lymphocytes Relative: 21 %
Lymphs Abs: 1.8 10*3/uL (ref 0.7–4.0)
MCH: 30.6 pg (ref 26.0–34.0)
MCHC: 32.1 g/dL (ref 30.0–36.0)
MCV: 95.4 fL (ref 80.0–100.0)
Monocytes Absolute: 0.9 10*3/uL (ref 0.1–1.0)
Monocytes Relative: 11 %
Neutro Abs: 5.7 10*3/uL (ref 1.7–7.7)
Neutrophils Relative %: 63 %
Platelets: 701 10*3/uL — ABNORMAL HIGH (ref 150–400)
RBC: 3.46 MIL/uL — ABNORMAL LOW (ref 4.22–5.81)
RDW: 14.5 % (ref 11.5–15.5)
WBC: 8.8 10*3/uL (ref 4.0–10.5)
nRBC: 0 % (ref 0.0–0.2)

## 2019-09-27 NOTE — Plan of Care (Signed)
  Problem: RH Balance Goal: LTG Patient will maintain dynamic standing with ADLs (OT) Description: LTG:  Patient will maintain dynamic standing balance with assist during activities of daily living (OT)  Outcome: Completed/Met   Problem: Sit to Stand Goal: LTG:  Patient will perform sit to stand in prep for activites of daily living with assistance level (OT) Description: LTG:  Patient will perform sit to stand in prep for activites of daily living with assistance level (OT) Outcome: Completed/Met   Problem: RH Grooming Goal: LTG Patient will perform grooming w/assist,cues/equip (OT) Description: LTG: Patient will perform grooming with assist, with/without cues using equipment (OT) Outcome: Completed/Met   Problem: RH Bathing Goal: LTG Patient will bathe all body parts with assist levels (OT) Description: LTG: Patient will bathe all body parts with assist levels (OT) Outcome: Completed/Met   Problem: RH Dressing Goal: LTG Patient will perform upper body dressing (OT) Description: LTG Patient will perform upper body dressing with assist, with/without cues (OT). Outcome: Completed/Met Goal: LTG Patient will perform lower body dressing w/assist (OT) Description: LTG: Patient will perform lower body dressing with assist, with/without cues in positioning using equipment (OT) Outcome: Completed/Met   Problem: RH Toileting Goal: LTG Patient will perform toileting task (3/3 steps) with assistance level (OT) Description: LTG: Patient will perform toileting task (3/3 steps) with assistance level (OT)  Outcome: Completed/Met   Problem: RH Toilet Transfers Goal: LTG Patient will perform toilet transfers w/assist (OT) Description: LTG: Patient will perform toilet transfers with assist, with/without cues using equipment (OT) Outcome: Completed/Met   

## 2019-09-27 NOTE — Progress Notes (Signed)
[place] PHYSICAL MEDICINE & REHABILITATION PROGRESS NOTE   Subjective/Complaints: Pt excited and nervous about going home tomorrow , went up steps with therapy today   ROS: Denies CP, SOB, N/V/D  Objective:   No results found. No results for input(s): WBC, HGB, HCT, PLT in the last 72 hours. No results for input(s): NA, K, CL, CO2, GLUCOSE, BUN, CREATININE, CALCIUM in the last 72 hours.  Intake/Output Summary (Last 24 hours) at 09/27/2019 1009 Last data filed at 09/27/2019 0800 Gross per 24 hour  Intake 1004 ml  Output 595 ml  Net 409 ml     Physical Exam: Vital Signs Blood pressure 113/90, pulse 84, temperature 97.6 F (36.4 C), temperature source Oral, resp. rate 16, weight 88.5 kg, SpO2 99 %. Constitutional: No distress . Vital signs reviewed. HENT: Normocephalic.  Atraumatic. Eyes: EOMI. No discharge. Cardiovascular: No JVD. Respiratory: Normal effort.  No stridor. GI: Non-distended. +Ileostomy. Skin: Warm and dry.  Intact. Psych: Flat.  Musc: Right knee tenderness Neurological: Alert Motor: Right upper extremity: 4 -/5 proximal distal Left upper extremity: Shoulder abduction, elbow flexion/extension 2+/5, handgrip trace (some pain inhibition), unchanged Left lower extremity: Hip flexion, knee extension 2+/5, ankle dorsiflexion 1/5, unchanged Dysarthria  Left facial weakness  Assessment/Plan: 1. Functional deficits secondary to acute brainstem infarct which require 3+ hours per day of interdisciplinary therapy in a comprehensive inpatient rehab setting.  Physiatrist is providing close team supervision and 24 hour management of active medical problems listed below.  Physiatrist and rehab team continue to assess barriers to discharge/monitor patient progress toward functional and medical goals  Care Tool:  Bathing  Bathing activity did not occur: Refused Body parts bathed by patient: Left arm, Chest, Abdomen, Face, Right upper leg, Left upper leg, Right arm,  Front perineal area, Buttocks, Right lower leg, Left lower leg   Body parts bathed by helper: Right lower leg, Left lower leg     Bathing assist Assist Level: Supervision/Verbal cueing     Upper Body Dressing/Undressing Upper body dressing   What is the patient wearing?: Pull over shirt    Upper body assist Assist Level: Set up assist    Lower Body Dressing/Undressing Lower body dressing      What is the patient wearing?: Pants, Underwear/pull up     Lower body assist Assist for lower body dressing: Supervision/Verbal cueing     Toileting Toileting    Toileting assist Assist for toileting: Minimal Assistance - Patient > 75%(per most recent staff report) Assistive Device Comment: urinal   Transfers Chair/bed transfer  Transfers assist     Chair/bed transfer assist level: Supervision/Verbal cueing     Locomotion Ambulation   Ambulation assist      Assist level: Supervision/Verbal cueing Assistive device: Walker-rolling Max distance: 53ft   Walk 10 feet activity   Assist     Assist level: Supervision/Verbal cueing Assistive device: Walker-rolling   Walk 50 feet activity   Assist Walk 50 feet with 2 turns activity did not occur: Safety/medical concerns  Assist level: Minimal Assistance - Patient > 75%      Walk 150 feet activity   Assist Walk 150 feet activity did not occur: Safety/medical concerns         Walk 10 feet on uneven surface  activity   Assist Walk 10 feet on uneven surfaces activity did not occur: Safety/medical concerns         Wheelchair     Assist Will patient use wheelchair at discharge?: Yes Type of Wheelchair:  Manual    Wheelchair assist level: Supervision/Verbal cueing Max wheelchair distance: 150    Wheelchair 50 feet with 2 turns activity    Assist        Assist Level: Supervision/Verbal cueing   Wheelchair 150 feet activity     Assist      Assist Level: Supervision/Verbal cueing    Blood pressure 113/90, pulse 84, temperature 97.6 F (36.4 C), temperature source Oral, resp. rate 16, weight 88.5 kg, SpO2 99 %.  Medical Problem List and Plan: 1.  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  Continue CIR plan D/C in am   -continue CPM 2.  Antithrombotics: -DVT/anticoagulation:  Pharmaceutical: lovenox 40mg  daily  doppler neg for DVT              -antiplatelet therapy: ASA 3. Pain Management: tylenol for mild and oxycodone for severe pain.              Monitor with increased mobility  Voltaren gel for right knee, Lidoderm patch for left shoulder   Controlled on 12/20 4. Mood: LCSW to follow for support and evaluation. Team to provide ego support             -antipsychotic agents: N/A.  5. Neuropsych: This patient is capable of making decisions on his own behalf. 6. Skin/Wound Care: routine pressure relief measures., abd wound healing, likely from heat related to overlay, changed benadryl to calamine 7. Fluids/Electrolytes/Nutrition: Monitor I/O. Encourage intake 8. Right pontine stroke: On ASA for secondary stroke prevention.  9. Mesenteric injuries s/p Ileocecectomy, total colectomy and repair of lacerations: Foley to collect high volume out of ileostomy."liquidy stools expected given lack of lg colon  Lomotil prn 10. Fistula/leak/abscess: completed  Zosyn 11/15-->11/29. 12. Situational depression with insomnia: Continue Seroquel at nights.    Ambien timing adjusted per patient preference  13. Right knee pain: Question MRI for workup, does not appear necessary at this time.  14. ORIF left clavicle and left distal radius fracture: NWB left wrist--may WB thorough elbow with platform walker. 15.  IM nailing right femur and ORIF right bimalleolar Fx: .  16. Anemia:likely ABLA  Hemoglobin 10.1 on 12/14, labs ordered for tomorrow 17.  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 He has CPM ordered bilaterally  Cont CPM slowly advancing to goals    Continue aggressive rehab efforts  18.  Left MCP, PIP and DIP contracture- extention, therapies 19.  Papular rash upper chest and thighs, miliaria-  calamine working well    20.  Dysphagia post stroke  Now on regular diet thin liquids with medicine in pure 21.  Hyponatremia  Sodium 131 on 12/14, labs ordered for tomorrow  LOS: 17 days A FACE TO [facility] E Dallis Czaja 09/27/2019, 10:09 AM

## 2019-09-27 NOTE — Plan of Care (Signed)
  Problem: Consults Goal: RH STROKE PATIENT EDUCATION Description: See Patient Education module for education specifics  Outcome: Progressing   Problem: RH BOWEL ELIMINATION Goal: RH STG MANAGE BOWEL WITH ASSISTANCE Description: STG Manage Bowel with mod Assistance. Outcome: Progressing Goal: RH STG MANAGE BOWEL W/MEDICATION W/ASSISTANCE Description: STG Manage Bowel with Medication with mod I Assistance. Outcome: Progressing   Problem: RH BLADDER ELIMINATION Goal: RH STG MANAGE BLADDER WITH ASSISTANCE Description: STG Manage Bladder With min Assistance Outcome: Progressing   Problem: RH SKIN INTEGRITY Goal: RH STG SKIN FREE OF INFECTION/BREAKDOWN Description: Patients skin will remain free from further infection or breakdown with min assist. Outcome: Progressing Goal: RH STG MAINTAIN SKIN INTEGRITY WITH ASSISTANCE Description: STG Maintain Skin Integrity With min Assistance. Outcome: Progressing Goal: RH STG ABLE TO PERFORM INCISION/WOUND CARE W/ASSISTANCE Description: STG Able To Perform Incision/Wound Care With mod Assistance. Outcome: Progressing   Problem: RH SAFETY Goal: RH STG ADHERE TO SAFETY PRECAUTIONS W/ASSISTANCE/DEVICE Description: STG Adhere to Safety Precautions With mod I Assistance/Device. Outcome: Progressing   Problem: RH PAIN MANAGEMENT Goal: RH STG PAIN MANAGED AT OR BELOW PT'S PAIN GOAL Description: < 4 Outcome: Progressing   Problem: RH KNOWLEDGE DEFICIT Goal: RH STG INCREASE KNOWLEDGE OF DYSPHAGIA/FLUID INTAKE Description: Min assist Outcome: Progressing Goal: RH STG INCREASE KNOWLEDGE OF STROKE PROPHYLAXIS Description: Patient/caregiver will verbalize understanding of stroke prophylaxis including monitoring, diet, exercise, and medications with min assist. Outcome: Progressing

## 2019-09-27 NOTE — Progress Notes (Signed)
Social Work Discharge Note   The overall goal for the admission was met for:   Discharge location: Yes-HOME WITH WIFE WHO IS TAKING FMLA TO PROVIDE 24 HR CARE  Length of Stay: Yes-27 DAYS  Discharge activity level: Yes-SUPERVISION-MIN ASSIST  Home/community participation: Yes  Services provided included: MD, RD, PT, OT, SLP, RN, CM, Pharmacy, Neuropsych and SW  Financial Services: Other: [employer]  Follow-up services arranged: Home Health: [facility], DME: [facility], [facility] and Patient/Family has no preference for HH/DME agencies  Comments (or additional information):WIFE AND BROTHER IN-LA[place] WERE IN FOR MULTIPLE FAMILY TRAINING AND FEEL COMFORTABLE WITH HIS CARE AT HOME. COMFORTABLE WITH ILEOSTOMY EDUCATION  Patient/Family verbalized understanding of follow-up arrangements: Yes  Individual responsible for coordination of the follow-up plan: SELF & BOBBIE-WIFE  Confirmed correct DME delivered: Elease Hashimoto 09/27/2019    Elease Hashimoto

## 2019-09-27 NOTE — Consult Note (Addendum)
[facility] Nurse ostomy follow up Stoma type/location:  RUQ, ileostomy Stomal assessment/size: red and moist, 1 1/4 inches, flush with skin  Peristomal assessment: NA Treatment options for stomal/peristomal skin: 2" barrier ring Output liquid green stool Ostomy pouching: Demonstrated pouch change using barrier ring to attempt to maintain a seal and one piece convex pouch.  Pt watched the procedure and was able to assist with emptying and closing velcro with limited use of left hand. States his wife has received teaching and applied pouch previously.  Reviewed pouching routines and ordering supplies.  5 extra sets of barrier rings and convex pouches at bedside for use after discharge and educational materials in the room. Enrolled patient in [facility] Start Discharge program: Yes Please re-consult if further assistance is needed.  Thank-you,  Julien Girt MSN, [place], [place], [place], [place]

## 2019-09-27 NOTE — Discharge Summary (Signed)
Occupational Therapy Discharge Summary  Patient Details  Name: [name] MRN: [id number] Date of Birth: Jun 17, 1961  Today's Date: 09/27/2019 OT Individual Time:  - 0900-1000 and 1419-1532 OT Individual Treamtent Time Calculation: 60 min and 73 min     Patient has met 8 of 8 long term goals due to improved activity tolerance, improved balance, postural control, ability to compensate for deficits, functional use of  RIGHT lower, LEFT upper and LEFT lower extremity and improved coordination.  Patient to discharge at overall Supervision level.  Patient's wife Theressa Stamps is independent to provide the necessary  assistance at discharge and has attended family education.    All goals met.   Recommendation:  Patient will benefit from ongoing skilled OT services in home health setting to continue to advance functional skills in the area of BADL.  Equipment: No equipment provided  Reasons for discharge: treatment goals met and discharge from hospital  Patient/family agrees with progress made and goals achieved: Yes   Skilled Therapeutic Intervention:  Pt greeted in bed, pain manageable for tx. He wanted to completed bathing/dressing prior to d/c tomorrow. Supine<sit and squat pivot<w/c completed with supervision assist. He then completed self care tasks sit<stand at the sink. Lt hand used at nondominant level, exhibiting functional grasp when pt managed wash cloth and brought shampoo cap to head. Active assist from Rt to thoroughly reach Rt underarm and crown of head. Supervision for sit<stand and for dynamic standing balance during perihygiene and LB dressing tasks. Pt used LH sponge to wash feet during bathing, and reacher + sock aide during LB dressing. He needed Min A to don shoes with bilateral AFOs. Supervision for ambulatory bathroom transfer to standard toilet using RW. Pt very proud of himself for powering up unassisted from this low surface, encouraged for transferring to toilet at  home. At end of session pt transferred to the recliner and was left with all needs within reach and safety belt fastened.    2nd Session 1:1 tx (73 min) Pt greeted in bed with no c/o pain. Agreeable to tx with ADL needs mostly met. He wanted to start session by brushing his teeth w/c level at the sink. Afterwards, pt donned his sweatshirt and then OT escorted him to outdoor courtyard. Tx focus was placed on community mobility, navigating over uneven surfaces while ambulating with RW and supervision assist. While sitting in front of the Christmas tree, pt completed active assist ROM exercises for L UE with min vcs. OT performed passive stretching in shoulder external rotation due to tightness. Worked on graded resistance with gravity minimized. Next escorted pt to the atrium. Worked on UB strengthening by propelling w/c in this community setting, maneuvering around natural obstacles in the environment. Vcs for strong grasp/releases with Lt. Pt was then returned to the room and transferred back to bed via stand pivot using the bedrail. Left him with all needs within reach and 4 bedrails up.     OT Discharge Pain: Pt reported pain to be manageable during both tx sessions with rest breaks     ADL ADL Eating: Independent(per most recent staff report) Grooming: Setup Where Assessed-Grooming: Sitting at sink Upper Body Bathing: Setup Where Assessed-Upper Body Bathing: Sitting at sink Lower Body Bathing: Supervision/safety Where Assessed-Lower Body Bathing: Sitting at sink, Standing at sink(using LH sponge) Upper Body Dressing: Setup Where Assessed-Upper Body Dressing: Sitting at sink Lower Body Dressing: Minimal assistance Where Assessed-Lower Body Dressing: Sitting at sink Toileting: Minimal assistance Where Assessed-Toileting: Glass blower/designer:  Close supervision Toilet Transfer Method: Ambulating(RW) Social research officer, government: (N/A due to no goal. Pt not medically cleared to shower at time of  d/c) Vision Baseline Vision/History: Wears glasses Wears Glasses: Reading only Patient Visual Report: Blurring of vision Perception  Perception: Within Functional Limits Praxis Praxis: Intact Cognition Overall Cognitive Status: Within Functional Limits for tasks assessed Arousal/Alertness: Awake/alert Orientation Level: Oriented X4 Memory: Appears intact Awareness: Appears intact Safety/Judgment: Appears intact Sensation Sensation Light Touch: Appears Intact Coordination Gross Motor Movements are Fluid and Coordinated: No Fine Motor Movements are Fluid and Coordinated: No Coordination and Movement Description: L UE and R LE weakness with limited functional mobility during self care tasks Finger Nose Finger Test: WNL Rt, ataxia with Lt Motor  Motor Motor: Hemiplegia Motor - Discharge Observations: Motor abilities affected by polytrauma, knee contractures, and Lt hemplegia following CVA Mobility    Supervision ambulatory toilet transfer using RW Trunk/Postural Assessment  Postural Control Postural Control: Within Functional Limits  Balance Balance Balance Assessed: Yes Dynamic Sitting Balance Dynamic Sitting - Balance Support: No upper extremity supported;During functional activity Dynamic Sitting - Level of Assistance: 5: Stand by assistance(donning gripper socks EOB using sock aide) Dynamic Standing Balance Dynamic Standing - Balance Support: During functional activity;No upper extremity supported Dynamic Standing - Level of Assistance: 5: Stand by assistance Dynamic Standing - Balance Activities: Lateral lean/weight shifting;Forward lean/weight shifting(LB dressing) Extremity/Trunk Assessment RUE Assessment RUE Assessment: Within Functional Limits Active Range of Motion (AROM) Comments: WNL General Strength Comments: 4-/5 grossly LUE Assessment LUE Assessment: Exceptions to [facility] Active Range of Motion (AROM) Comments: ~40 degrees shoulder flexion, ~90 degrees  shoulder abduction, full elbow ROM, absent wrist extension, limited forearm supination General Strength Comments: now WBAT through UE, tolerates minimal resistance forearm extension with gravity minimized. functional grasp.    A  09/27/2019, 12:30 PM

## 2019-09-28 MED ORDER — ASPIRIN 325 MG PO TABS: 325.0000 mg | ORAL_TABLET | Freq: Every day | ORAL | Status: DC

## 2019-09-28 MED ORDER — DICLOFENAC SODIUM 1 % EX GEL: 2.0000 g | Freq: Three times a day (TID) | CUTANEOUS | Status: DC | PRN

## 2019-09-28 MED ORDER — PROPRANOLOL HCL 20 MG PO TABS: 20.0000 mg | ORAL_TABLET | Freq: Three times a day (TID) | ORAL | 0 refills | Status: DC

## 2019-09-28 MED ORDER — VITAMIN D3 25 MCG PO TABS: 2000.0000 [IU] | ORAL_TABLET | Freq: Two times a day (BID) | ORAL | Status: DC

## 2019-09-28 MED ORDER — QUETIAPINE FUMARATE 25 MG PO TABS: 25.0000 mg | ORAL_TABLET | Freq: Every day | ORAL | 0 refills | Status: DC

## 2019-09-28 MED ORDER — MELATONIN 3 MG PO TABS: 6.0000 mg | ORAL_TABLET | Freq: Every evening | ORAL | 0 refills | Status: DC | PRN

## 2019-09-28 MED ORDER — LIDOCAINE 5 % EX PTCH: 1.0000 | MEDICATED_PATCH | CUTANEOUS | 0 refills | Status: DC

## 2019-09-28 MED ORDER — ZOLPIDEM TARTRATE 5 MG PO TABS: 5.0000 mg | ORAL_TABLET | Freq: Every day | ORAL | 0 refills | Status: DC

## 2019-09-28 MED ORDER — TRAMADOL HCL 50 MG PO TABS: 50.0000 mg | ORAL_TABLET | Freq: Four times a day (QID) | ORAL | 0 refills | Status: DC | PRN

## 2019-09-28 MED ORDER — CELECOXIB 200 MG PO CAPS: 200.0000 mg | ORAL_CAPSULE | Freq: Every day | ORAL | 0 refills | Status: DC

## 2019-09-28 MED ORDER — DIPHENOXYLATE-ATROPINE 2.5-0.025 MG PO TABS: 2.0000 | ORAL_TABLET | Freq: Two times a day (BID) | ORAL | 0 refills | Status: DC

## 2019-09-28 MED ORDER — CALAMINE EX LOTN: TOPICAL_LOTION | Freq: Four times a day (QID) | CUTANEOUS | 0 refills | Status: DC

## 2019-09-28 NOTE — Plan of Care (Signed)
  Problem: Consults Goal: RH STROKE PATIENT EDUCATION Description: See Patient Education module for education specifics  09/28/2019 0956 by Amanda Cockayne, LPN Outcome: Completed/Met 09/28/2019 0940 by Amanda Cockayne, LPN Outcome: Progressing   Problem: RH BOWEL ELIMINATION Goal: RH STG MANAGE BOWEL WITH ASSISTANCE Description: STG Manage Bowel with mod Assistance. 09/28/2019 0956 by Amanda Cockayne, LPN Outcome: Completed/Met 09/28/2019 0940 by Amanda Cockayne, LPN Outcome: Progressing Goal: RH STG MANAGE BOWEL W/MEDICATION W/ASSISTANCE Description: STG Manage Bowel with Medication with mod I Assistance. 09/28/2019 0956 by Amanda Cockayne, LPN Outcome: Completed/Met 09/28/2019 0940 by Amanda Cockayne, LPN Outcome: Progressing   Problem: RH BLADDER ELIMINATION Goal: RH STG MANAGE BLADDER WITH ASSISTANCE Description: STG Manage Bladder With min Assistance 09/28/2019 0956 by Amanda Cockayne, LPN Outcome: Completed/Met 09/28/2019 0940 by Amanda Cockayne, LPN Outcome: Progressing   Problem: RH SKIN INTEGRITY Goal: RH STG SKIN FREE OF INFECTION/BREAKDOWN Description: Patients skin will remain free from further infection or breakdown with min assist. 09/28/2019 0956 by Amanda Cockayne, LPN Outcome: Completed/Met 09/28/2019 0940 by Amanda Cockayne, LPN Outcome: Progressing Goal: RH STG MAINTAIN SKIN INTEGRITY WITH ASSISTANCE Description: STG Maintain Skin Integrity With min Assistance. 09/28/2019 0956 by Amanda Cockayne, LPN Outcome: Completed/Met 09/28/2019 0940 by Amanda Cockayne, LPN Outcome: Progressing Goal: RH STG ABLE TO PERFORM INCISION/WOUND CARE W/ASSISTANCE Description: STG Able To Perform Incision/Wound Care With mod Assistance. 09/28/2019 0956 by Amanda Cockayne, LPN Outcome: Completed/Met 09/28/2019 0940 by Amanda Cockayne, LPN Outcome: Progressing   Problem: RH SAFETY Goal: RH STG ADHERE TO SAFETY PRECAUTIONS  W/ASSISTANCE/DEVICE Description: STG Adhere to Safety Precautions With mod I Assistance/Device. 09/28/2019 0956 by Amanda Cockayne, LPN Outcome: Completed/Met 09/28/2019 0940 by Amanda Cockayne, LPN Outcome: Progressing   Problem: RH PAIN MANAGEMENT Goal: RH STG PAIN MANAGED AT OR BELOW PT'S PAIN GOAL Description: < 4 09/28/2019 0956 by Amanda Cockayne, LPN Outcome: Completed/Met 09/28/2019 0940 by Amanda Cockayne, LPN Outcome: Progressing   Problem: RH KNOWLEDGE DEFICIT Goal: RH STG INCREASE KNOWLEDGE OF DYSPHAGIA/FLUID INTAKE Description: Min assist 09/28/2019 0956 by Amanda Cockayne, LPN Outcome: Completed/Met 09/28/2019 0940 by Amanda Cockayne, LPN Outcome: Progressing Goal: RH STG INCREASE KNOWLEDGE OF STROKE PROPHYLAXIS Description: Patient/caregiver will verbalize understanding of stroke prophylaxis including monitoring, diet, exercise, and medications with min assist. 09/28/2019 0956 by Amanda Cockayne, LPN Outcome: Completed/Met 09/28/2019 0940 by Amanda Cockayne, LPN Outcome: Progressing

## 2019-09-28 NOTE — Progress Notes (Signed)
Pt was discharged home with wife. Discharge instructions given by Jeannene Patella, PA. Wife spoke with [facility] to discuss braces that were needed. No questions noted. Sent home with belongings via wheelchair. Amanda Cockayne, LPN

## 2019-09-28 NOTE — Plan of Care (Signed)
  Problem: Consults Goal: RH STROKE PATIENT EDUCATION Description: See Patient Education module for education specifics  Outcome: Progressing   Problem: RH BOWEL ELIMINATION Goal: RH STG MANAGE BOWEL WITH ASSISTANCE Description: STG Manage Bowel with mod Assistance. Outcome: Progressing Goal: RH STG MANAGE BOWEL W/MEDICATION W/ASSISTANCE Description: STG Manage Bowel with Medication with mod I Assistance. Outcome: Progressing   Problem: RH BLADDER ELIMINATION Goal: RH STG MANAGE BLADDER WITH ASSISTANCE Description: STG Manage Bladder With min Assistance Outcome: Progressing   Problem: RH SKIN INTEGRITY Goal: RH STG SKIN FREE OF INFECTION/BREAKDOWN Description: Patients skin will remain free from further infection or breakdown with min assist. Outcome: Progressing Goal: RH STG MAINTAIN SKIN INTEGRITY WITH ASSISTANCE Description: STG Maintain Skin Integrity With min Assistance. Outcome: Progressing Goal: RH STG ABLE TO PERFORM INCISION/WOUND CARE W/ASSISTANCE Description: STG Able To Perform Incision/Wound Care With mod Assistance. Outcome: Progressing   Problem: RH SAFETY Goal: RH STG ADHERE TO SAFETY PRECAUTIONS W/ASSISTANCE/DEVICE Description: STG Adhere to Safety Precautions With mod I Assistance/Device. Outcome: Progressing   Problem: RH PAIN MANAGEMENT Goal: RH STG PAIN MANAGED AT OR BELOW PT'S PAIN GOAL Description: < 4 Outcome: Progressing   Problem: RH KNOWLEDGE DEFICIT Goal: RH STG INCREASE KNOWLEDGE OF DYSPHAGIA/FLUID INTAKE Description: Min assist Outcome: Progressing Goal: RH STG INCREASE KNOWLEDGE OF STROKE PROPHYLAXIS Description: Patient/caregiver will verbalize understanding of stroke prophylaxis including monitoring, diet, exercise, and medications with min assist. Outcome: Progressing

## 2019-09-28 NOTE — Discharge Instructions (Signed)
Inpatient Rehab Discharge Instructions  [name] Discharge date and time: 09/28/19   Activities/Precautions/ Functional Status: Activity: no lifting, driving, or strenuous exercise for till cleared by MD Diet: cardiac diet Wound Care: Colostomy care as instructed. Keep incisions clean and dry. Contact surgeon if you develop any problems with your incision/wound--redness, swelling, increase in pain, drainage or if you develop fever or chills.   Functional status:  ___ No restrictions     ___ Walk up steps independently _X__ 24/7 supervision/assistance   ___ Walk up steps with assistance ___ Intermittent supervision/assistance  ___ Bathe/dress independently ___ Walk with walker     ___ Bathe/dress with assistance ___ Walk Independently    ___ Shower independently ___ Walk with assistance    _X__ Shower with assistance _X__ No alcohol     ___ Return to work/school ________   Special Instructions: 1. Needs to increase water intake. 2. Keep working on range of motion of knees daily. 3. Midline incision --wash with soap and water. Keep clean and dry.     COMMUNITY REFERRALS UPON DISCHARGE:    Home Health:   PT, OT, RN  Agency:[facility]   Phone:[phone]   Date of last service:09/28/2019  Medical Equipment/Items Ordered: [place]   [id number]                                                               [place]  [phone]   My questions have been answered and I understand these instructions. I will adhere to these goals and the provided educational materials after my discharge from the hospital.  Patient/Caregiver Signature _______________________________ Date __________  Clinician Signature _______________________________________ Date __________  Please bring this form and your medication list with you to all your follow-up doctor's appointments.

## 2019-09-28 NOTE — Progress Notes (Signed)
PHYSICAL MEDICINE & REHABILITATION PROGRESS NOTE   Subjective/Complaints:  No issues overnite except excited about going home, discussed bloodwork and need to increase fluid intake  ROS: Denies CP, SOB, N/V/D  Objective:   No results found. Recent Labs    09/27/19 1010  WBC 8.8  HGB 10.6*  HCT 33.0*  PLT 701*   Recent Labs    09/27/19 1010  NA 132*  K 4.5  CL 99  CO2 21*  GLUCOSE 92  BUN 37*  CREATININE 1.14  CALCIUM 10.4*    Intake/Output Summary (Last 24 hours) at 09/28/2019 0803 Last data filed at 09/28/2019 0326 Gross per 24 hour  Intake 480 ml  Output 575 ml  Net -95 ml     Physical Exam: Vital Signs Blood pressure 124/89, pulse 88, temperature 97.7 F (36.5 C), temperature source Oral, resp. rate 18, weight 88.5 kg, SpO2 99 %. Constitutional: No distress . Vital signs reviewed. HENT: Normocephalic.  Atraumatic. Eyes: EOMI. No discharge. Cardiovascular: No JVD. Respiratory: Normal effort.  No stridor. GI: Non-distended. +Ileostomy. Skin: Warm and dry.  Intact. Psych: Flat.  Musc: Right knee tenderness Neurological: Alert Motor: Right upper extremity: 4 -/5 proximal distal Left upper extremity: Shoulder abduction, elbow flexion/extension 2+/5, handgrip trace (some pain inhibition), unchanged Left lower extremity: Hip flexion, knee extension 2+/5, ankle dorsiflexion 1/5, unchanged Dysarthria  Left facial weakness  Assessment/Plan: 1. Functional deficits secondary to acute brainstem infarct  Stable for D/C today F/u PCP in 3-4 weeks F/u PM&R 2 weeks See D/C summary See D/C instructions Care Tool:  Bathing  Bathing activity did not occur: Refused Body parts bathed by patient: Left arm, Chest, Abdomen, Face, Right upper leg, Left upper leg, Right arm, Front perineal area, Buttocks, Right lower leg, Left lower leg   Body parts bathed by helper: Right lower leg, Left lower leg     Bathing assist Assist Level: Supervision/Verbal  cueing     Upper Body Dressing/Undressing Upper body dressing   What is the patient wearing?: Pull over shirt    Upper body assist Assist Level: Set up assist    Lower Body Dressing/Undressing Lower body dressing      What is the patient wearing?: Pants, Underwear/pull up     Lower body assist Assist for lower body dressing: Supervision/Verbal cueing     Toileting Toileting    Toileting assist Assist for toileting: Minimal Assistance - Patient > 75%(per most recent staff report) Assistive Device Comment: urinal   Transfers Chair/bed transfer  Transfers assist     Chair/bed transfer assist level: Supervision/Verbal cueing     Locomotion Ambulation   Ambulation assist      Assist level: Supervision/Verbal cueing Assistive device: Walker-rolling(bilateral AFOs and L hand orthosis) Max distance: 50'   Walk 10 feet activity   Assist     Assist level: Supervision/Verbal cueing Assistive device: Walker-rolling, Orthosis   Walk 50 feet activity   Assist Walk 50 feet with 2 turns activity did not occur: Safety/medical concerns  Assist level: Supervision/Verbal cueing Assistive device: Walker-rolling, Orthosis    Walk 150 feet activity   Assist Walk 150 feet activity did not occur: Safety/medical concerns(endurance)         Walk 10 feet on uneven surface  activity   Assist Walk 10 feet on uneven surfaces activity did not occur: Safety/medical concerns   Assist level: Supervision/Verbal cueing Assistive device: Walker-rolling, Orthosis   Wheelchair     Assist Will patient use wheelchair at discharge?: Yes Type  of Wheelchair: Manual    Wheelchair assist level: Independent Max wheelchair distance: 150    Wheelchair 50 feet with 2 turns activity    Assist        Assist Level: Independent   Wheelchair 150 feet activity     Assist      Assist Level: Independent   Blood pressure 124/89, pulse 88, temperature 97.7 F  (36.5 C), temperature source Oral, resp. rate 18, weight 88.5 kg, SpO2 99 %.  Medical Problem List and Plan: 1.  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 Discharge  today   discontinue CPM 2.  Antithrombotics: -DVT/anticoagulation:  Pharmaceutical: lovenox 40mg  daily  doppler neg for DVT              -antiplatelet therapy: ASA 3. Pain Management: tylenol for mild and oxycodone for severe pain.              Monitor with increased mobility  Voltaren gel for right knee, Lidoderm patch for left shoulder   Controlled on 12/21 4. Mood: LCSW to follow for support and evaluation. Team to provide ego support             -antipsychotic agents: N/A.  5. Neuropsych: This patient is capable of making decisions on his own behalf. 6. Skin/Wound Care: routine pressure relief measures., abd wound healing, likely from heat related to overlay, changed benadryl to calamine Gr 1 skin area Left buttocks cheek, advise sitting up in chair during the day at home  7. Fluids/Electrolytes/Nutrition: Monitor I/O. Encourage intake 8. Right pontine stroke: On ASA for secondary stroke prevention.  9. Mesenteric injuries s/p Ileocecectomy, total colectomy and repair of lacerations: Foley to collect high volume out of ileostomy."liquidy stools expected given lack of lg colon  Lomotil prn 10. Fistula/leak/abscess: completed  Zosyn 11/15-->11/29. 12. Situational depression with insomnia: Continue Seroquel at nights.    Ambien timing adjusted per patient preference  13. Right knee pain: Question MRI for workup, does not appear necessary at this time.  14. ORIF left clavicle and left distal radius fracture: NWB left wrist--may WB thorough elbow with platform walker. 15.  IM nailing right femur and ORIF right bimalleolar Fx: .  16. Anemia:likely ABLA  Hemoglobin 10.1 on 12/14, 10.6 on 12/21 trending upwards  17.  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 He has CPM ordered bilaterally  Cont CPM slowly advancing to goals    Continue aggressive rehab efforts  18.  Left MCP, PIP and DIP contracture- extention, therapies 19.  Papular rash upper chest and thighs, miliaria-  calamine working well    20.  Dysphagia post stroke  Now on regular diet thin liquids with medicine in pure- elevated BUN but not creat needs to increase fluids  21.  Hyponatremia  Sodium 131 on 12/14,stable 132 on 12/21  LOS: 18 days A FACE TO [employer] E Sariya Trickey 09/28/2019, 8:03 AM

## 2019-09-29 ENCOUNTER — Telehealth: Payer: Self-pay | Admitting: *Deleted

## 2019-09-29 ENCOUNTER — Telehealth: Payer: Self-pay

## 2019-09-29 NOTE — Telephone Encounter (Signed)
1st attempt to reach Mr [name] unsuccessful 4:01pm 09/29/19  Appointment Thursday 10/07/19 @11 :40  Arrive by 11:20 to see Dr Ranell Patrick (No answer/Message left on vm to watch for paperwork in mail and appt date and time) [address]

## 2019-10-04 NOTE — Telephone Encounter (Signed)
Transitional care call completed, appointment confirmed, address confirmed.  Patient received packet in the mail.  Transitional Care Questions   1. Are you/is patient experiencing any problems since coming home? No  Are there any questions regarding any aspect of care? Who is the surgeon they are supposed to follow up with? Review of notes indicate Dr. Doreatha Martin did all the open reduction surgeries. I advised that they follow up with his office.  2. Are there any questions regarding medications administration/dosing? Medications reviewed with patient's wife, no issues.  Are meds being taken as prescribed?  Yes Patient should review meds with caller to confirm   3. Have there been any falls? No  4. Has Home Health been to the house and/or have they contacted you? HHPT has conducted initial evaluation.  HHRN and HHOT have not been to the house yet.  If not, have you tried to contact them? Can we help you contact them?   5. Are bowels and bladder emptying properly? Urination somewhat weak, Stools still soft  Are there any unexpected incontinence issues?  No If applicable, is patient following bowel/bladder programs?   6. Any fevers, problems with breathing, unexpected pain?  No fever, somewhat winded going up stairs and such, no unexpected pain  7. Are there any skin problems or new areas of breakdown? Wounds healing good, no more rash  8. Has the patient/family member arranged specialty MD follow up (ie cardiology/neurology/renal/surgical/etc)? Most follow ups scheduled, will contact Dr. Doreatha Martin, Question about Nephrology? Can we help arrange?   9. Does the patient need any other services or support that we can help arrange?  No  10. Are caregivers following through as expected in assisting the patient? Yes  11. Has the patient quit smoking, drinking alcohol, or using drugs as recommended? Never user

## 2019-10-06 ENCOUNTER — Telehealth: Payer: Self-pay | Admitting: *Deleted

## 2019-10-06 NOTE — Telephone Encounter (Signed)
HHPT left a message requesting verbal orders 2week8 to address motor control, balance, safety and mobility training. Medical record reviewed. Social work note reviewed.  Verbal orders given per office protocol.

## 2019-10-06 NOTE — Telephone Encounter (Signed)
allory OT [facility] called for POC 1wk1, 2wk6, 1wk2 .  Approval given.

## 2019-10-06 NOTE — Telephone Encounter (Signed)
Erroneous entry

## 2019-10-07 ENCOUNTER — Telehealth: Payer: Self-pay | Admitting: Orthopedic Surgery

## 2019-10-07 ENCOUNTER — Other Ambulatory Visit: Payer: Self-pay

## 2019-10-07 ENCOUNTER — Encounter: Payer: Self-pay | Admitting: Physical Medicine and Rehabilitation

## 2019-10-07 ENCOUNTER — Encounter: Payer: 59 | Attending: Physical Medicine and Rehabilitation | Admitting: Physical Medicine and Rehabilitation

## 2019-10-07 VITALS — BP 104/75 | HR 91 | Temp 97.9°F | Ht 70.0 in | Wt 130.0 lb

## 2019-10-07 DIAGNOSIS — Z8249 Family history of ischemic heart disease and other diseases of the circulatory system: Secondary | ICD-10-CM | POA: Insufficient documentation

## 2019-10-07 DIAGNOSIS — I6330 Cerebral infarction due to thrombosis of unspecified cerebral artery: Secondary | ICD-10-CM

## 2019-10-07 DIAGNOSIS — Z8042 Family history of malignant neoplasm of prostate: Secondary | ICD-10-CM | POA: Insufficient documentation

## 2019-10-07 MED ORDER — DIPHENOXYLATE-ATROPINE 2.5-0.025 MG PO TABS: 2.0000 | ORAL_TABLET | Freq: Two times a day (BID) | ORAL | 0 refills | Status: DC

## 2019-10-07 MED ORDER — TRAMADOL HCL 50 MG PO TABS: 50.0000 mg | ORAL_TABLET | Freq: Four times a day (QID) | ORAL | 0 refills | Status: DC | PRN

## 2019-10-07 MED ORDER — PROPRANOLOL HCL 20 MG PO TABS: 20.0000 mg | ORAL_TABLET | Freq: Three times a day (TID) | ORAL | 0 refills | Status: DC

## 2019-10-07 MED ORDER — ZOLPIDEM TARTRATE 5 MG PO TABS: 5.0000 mg | ORAL_TABLET | Freq: Every day | ORAL | 0 refills | Status: DC
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
## 2019-10-07 NOTE — Telephone Encounter (Signed)
Tried calling, no answer. LMVM per notes in chart looks like that Dr Doreatha Martin performed surgery on patient. Advised her to call me back with any further questions.

## 2019-10-07 NOTE — Telephone Encounter (Signed)
[name] with [facility] called in wanting to verify if the pt has any weight bearing restrictions on upper extremity.   [phone]

## 2019-10-11 ENCOUNTER — Telehealth: Payer: Self-pay

## 2019-10-11 NOTE — Telephone Encounter (Signed)
IC s/w Mallory and advised. She will reach out to Dr Haddix office.

## 2019-10-11 NOTE — Telephone Encounter (Signed)
Mallory from [facility] called needing WTB status for patient. Patient had surgery back in October with Dr Marlou Sa. She said that the last she had been told that patient was NWTB through [facility]. Please advise. Thanks.  Contact for her is [phone]

## 2019-10-15 DIAGNOSIS — S42022D Displaced fracture of shaft of left clavicle, subsequent encounter for fracture with routine healing: Secondary | ICD-10-CM | POA: Diagnosis not present

## 2019-10-15 DIAGNOSIS — D649 Anemia, unspecified: Secondary | ICD-10-CM

## 2019-10-15 DIAGNOSIS — I69391 Dysphagia following cerebral infarction: Secondary | ICD-10-CM

## 2019-10-15 DIAGNOSIS — S62102D Fracture of unspecified carpal bone, left wrist, subsequent encounter for fracture with routine healing: Secondary | ICD-10-CM

## 2019-10-15 DIAGNOSIS — Z7982 Long term (current) use of aspirin: Secondary | ICD-10-CM

## 2019-10-15 DIAGNOSIS — S7291XD Unspecified fracture of right femur, subsequent encounter for closed fracture with routine healing: Secondary | ICD-10-CM | POA: Diagnosis not present

## 2019-10-15 DIAGNOSIS — E559 Vitamin D deficiency, unspecified: Secondary | ICD-10-CM

## 2019-10-15 DIAGNOSIS — S52502D Unspecified fracture of the lower end of left radius, subsequent encounter for closed fracture with routine healing: Secondary | ICD-10-CM | POA: Diagnosis not present

## 2019-10-15 DIAGNOSIS — M24562 Contracture, left knee: Secondary | ICD-10-CM

## 2019-10-15 DIAGNOSIS — M24561 Contracture, right knee: Secondary | ICD-10-CM

## 2019-10-15 DIAGNOSIS — I471 Supraventricular tachycardia: Secondary | ICD-10-CM

## 2019-10-15 DIAGNOSIS — F4321 Adjustment disorder with depressed mood: Secondary | ICD-10-CM

## 2019-10-15 DIAGNOSIS — Z9181 History of falling: Secondary | ICD-10-CM

## 2019-10-15 DIAGNOSIS — E785 Hyperlipidemia, unspecified: Secondary | ICD-10-CM

## 2019-10-15 DIAGNOSIS — I69359 Hemiplegia and hemiparesis following cerebral infarction affecting unspecified side: Secondary | ICD-10-CM

## 2019-10-15 DIAGNOSIS — S82841D Displaced bimalleolar fracture of right lower leg, subsequent encounter for closed fracture with routine healing: Secondary | ICD-10-CM | POA: Diagnosis not present

## 2019-10-15 DIAGNOSIS — I10 Essential (primary) hypertension: Secondary | ICD-10-CM

## 2019-10-15 DIAGNOSIS — R1310 Dysphagia, unspecified: Secondary | ICD-10-CM

## 2019-10-15 DIAGNOSIS — N400 Enlarged prostate without lower urinary tract symptoms: Secondary | ICD-10-CM

## 2019-10-15 DIAGNOSIS — Z87442 Personal history of urinary calculi: Secondary | ICD-10-CM

## 2019-10-18 ENCOUNTER — Telehealth: Payer: Self-pay | Admitting: Adult Health

## 2019-10-18 NOTE — Telephone Encounter (Signed)
Spoke with pts wife advising her the NP is out of the office this week and seeing pts virtually or she can see Dr. Leonie Man tomorrow at 4. Theressa Stamps states she will speak with the pt and then give [place] a call back [facility]

## 2019-10-19 ENCOUNTER — Other Ambulatory Visit: Payer: Self-pay

## 2019-10-19 ENCOUNTER — Telehealth: Payer: Self-pay | Admitting: Family

## 2019-10-19 ENCOUNTER — Other Ambulatory Visit: Payer: Self-pay | Admitting: Physical Medicine and Rehabilitation

## 2019-10-20 ENCOUNTER — Encounter: Payer: Self-pay | Admitting: Family

## 2019-10-20 ENCOUNTER — Other Ambulatory Visit: Payer: Self-pay | Admitting: Physical Medicine and Rehabilitation

## 2019-10-20 ENCOUNTER — Ambulatory Visit: Payer: 59 | Admitting: Family

## 2019-10-20 VITALS — BP 117/79 | HR 90 | Temp 98.4°F | Ht 70.0 in | Wt 135.0 lb

## 2019-10-20 DIAGNOSIS — S3590XA Unspecified injury of unspecified blood vessel at abdomen, lower back and pelvis level, initial encounter: Secondary | ICD-10-CM

## 2019-10-20 DIAGNOSIS — I10 Essential (primary) hypertension: Secondary | ICD-10-CM

## 2019-10-20 DIAGNOSIS — R531 Weakness: Secondary | ICD-10-CM

## 2019-10-20 DIAGNOSIS — I6930 Unspecified sequelae of cerebral infarction: Secondary | ICD-10-CM

## 2019-10-20 DIAGNOSIS — Z09 Encounter for follow-up examination after completed treatment for conditions other than malignant neoplasm: Secondary | ICD-10-CM

## 2019-10-20 DIAGNOSIS — Z23 Encounter for immunization: Secondary | ICD-10-CM | POA: Diagnosis not present

## 2019-10-20 DIAGNOSIS — E860 Dehydration: Secondary | ICD-10-CM

## 2019-10-20 MED ORDER — PROPRANOLOL HCL 20 MG PO TABS: 20.0000 mg | ORAL_TABLET | Freq: Three times a day (TID) | ORAL | 6 refills | Status: DC
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
## 2019-10-20 NOTE — Patient Instructions (Signed)
Dehydration, Adult Dehydration is a condition in which there is not enough water or other fluids in the body. This happens when a person loses more fluids than he or she takes in. Important organs, such as the kidneys, brain, and heart, cannot function without a proper amount of fluids. Any loss of fluids from the body can lead to dehydration. Dehydration can be mild, moderate, or severe. It should be treated right away to prevent it from becoming severe. What are the causes? Dehydration may be caused by:  Conditions that cause loss of water or other fluids, such as diarrhea, vomiting, or sweating or urinating a lot.  Not drinking enough fluids, especially when you are ill or doing activities that require a lot of energy.  Other illnesses and conditions, such as fever or infection.  Certain medicines, such as medicines that remove excess fluid from the body (diuretics).  Lack of safe drinking water.  Not being able to get enough water and food. What increases the risk? The following factors may make you more likely to develop this condition:  Having a long-term (chronic) illness that has not been treated properly, such as diabetes, heart disease, or kidney disease.  Being 65 years of age or older.  Having a disability.  Living in a place that is high in altitude, where thinner, drier air causes more fluid loss.  Doing exercises that put stress on your body for a long time (endurance sports). What are the signs or symptoms? Symptoms of dehydration depend on how severe it is. Mild or moderate dehydration  Thirst.  Dry lips or dry mouth.  Dizziness or light-headedness, especially when standing up from a seated position.  Muscle cramps.  Dark urine. Urine may be the color of tea.  Less urine or tears produced than usual.  Headache. Severe dehydration  Changes in skin. Your skin may be cold and clammy, blotchy, or pale. Your skin also may not return to normal after being  lightly pinched and released.  Little or no tears, urine, or sweat.  Changes in vital signs, such as rapid breathing and low blood pressure. Your pulse may be weak or may be faster than 100 beats a minute when you are sitting still.  Other changes, such as: ? Feeling very thirsty. ? Sunken eyes. ? Cold hands and feet. ? Confusion. ? Being very tired (lethargic) or having trouble waking from sleep. ? Short-term weight loss. ? Loss of consciousness. How is this diagnosed? This condition is diagnosed based on your symptoms and a physical exam. You may have blood and urine tests to help confirm the diagnosis. How is this treated? Treatment for this condition depends on how severe it is. Treatment should be started right away. Do not wait until dehydration becomes severe. Severe dehydration is an emergency and needs to be treated in a hospital.  Mild or moderate dehydration can be treated at home. You may be asked to: ? Drink more fluids. ? Drink an oral rehydration solution (ORS). This drink helps restore proper amounts of fluids and salts and minerals in the blood (electrolytes).  Severe dehydration can be treated: ? With IV fluids. ? By correcting abnormal levels of electrolytes. This is often done by giving electrolytes through a tube that is passed through your nose and into your stomach (nasogastric tube, or NG tube). ? By treating the underlying cause of dehydration. Follow these instructions at home: Oral rehydration solution If told by your health care provider, drink an ORS:  Make   an ORS by following instructions on the package.  Start by drinking small amounts, about  cup (120 mL) every 5-10 minutes.  Slowly increase how much you drink until you have taken the amount recommended by your health care provider. Eating and drinking         Drink enough clear fluid to keep your urine pale yellow. If you were told to drink an ORS, finish the ORS first and then start slowly  drinking other clear fluids. Drink fluids such as: ? Water. Do not drink only water. Doing that can lead to hyponatremia, which is having too little salt (sodium) in the body. ? Water from ice chips you suck on. ? Fruit juice that you have added water to (diluted fruit juice). ? Low-calorie sports drinks.  Eat foods that contain a healthy balance of electrolytes, such as bananas, oranges, potatoes, tomatoes, and spinach.  Do not drink alcohol.  Avoid the following: ? Drinks that contain a lot of sugar. These include high-calorie sports drinks, fruit juice that is not diluted, and soda. ? Caffeine. ? Foods that are greasy or contain a lot of fat or sugar. General instructions  Take over-the-counter and prescription medicines only as told by your health care provider.  Do not take sodium tablets. Doing that can lead to having too much sodium in the body (hypernatremia).  Return to your normal activities as told by your health care provider. Ask your health care provider what activities are safe for you.  Keep all follow-up visits as told by your health care provider. This is important. Contact a health care provider if:  You have muscle cramps, pain, or discomfort, such as: ? Pain in your abdomen and the pain gets worse or stays in one area (localizes). ? Stiff neck.  You have a rash.  You are more irritable than usual.  You are sleepier or have a harder time waking than usual.  You feel weak or dizzy.  You feel very thirsty. Get help right away if you have:  Any symptoms of severe dehydration.  Symptoms of vomiting, such as: ? You cannot eat or drink without vomiting. ? Vomiting gets worse or does not go away. ? Vomit includes blood or green matter (bile).  Symptoms that get worse with treatment.  A fever.  A severe headache.  Problems with urination or bowel movements, such as: ? Diarrhea that gets worse or does not go away. ? Blood in your stool (feces). This  may cause stool to look black and tarry. ? Not urinating, or urinating only a small amount of very dark urine, within 6-8 hours.  Trouble breathing. These symptoms may represent a serious problem that is an emergency. Do not wait to see if the symptoms will go away. Get medical help right away. Call your local emergency services (911 in the [place] Do not drive yourself to the hospital. Summary  Dehydration is a condition in which there is not enough water or other fluids in the body. This happens when a person loses more fluids than he or she takes in.  Treatment for this condition depends on how severe it is. Treatment should be started right away. Do not wait until dehydration becomes severe.  Drink enough clear fluid to keep your urine pale yellow. If you were told to drink an oral rehydration solution (ORS), finish the ORS first and then start slowly drinking other clear fluids.  Take over-the-counter and prescription medicines only as told by your health care   provider.  Get help right away if you have any symptoms of severe dehydration. This information is not intended to replace advice given to you by your health care provider. Make sure you discuss any questions you have with your health care provider. Document Revised: 05/06/2019 Document Reviewed: 05/06/2019 [employer] Patient Education  2020 [employer]   

## 2019-10-21 ENCOUNTER — Encounter: Payer: Self-pay | Admitting: Adult Health

## 2019-10-21 ENCOUNTER — Telehealth (INDEPENDENT_AMBULATORY_CARE_PROVIDER_SITE_OTHER): Payer: 59 | Admitting: Adult Health

## 2019-10-21 DIAGNOSIS — F329 Major depressive disorder, single episode, unspecified: Secondary | ICD-10-CM

## 2019-10-21 DIAGNOSIS — I6350 Cerebral infarction due to unspecified occlusion or stenosis of unspecified cerebral artery: Secondary | ICD-10-CM | POA: Diagnosis not present

## 2019-10-21 DIAGNOSIS — G8194 Hemiplegia, unspecified affecting left nondominant side: Secondary | ICD-10-CM

## 2019-10-21 DIAGNOSIS — I10 Essential (primary) hypertension: Secondary | ICD-10-CM

## 2019-10-21 DIAGNOSIS — R471 Dysarthria and anarthria: Secondary | ICD-10-CM

## 2019-10-21 LAB — CBC WITH DIFFERENTIAL/PLATELET
Basophils Absolute: 0.1 10*3/uL (ref 0.0–0.2)
Basos: 1 %
EOS (ABSOLUTE): 0.1 10*3/uL (ref 0.0–0.4)
Eos: 2 %
Hematocrit: 30.5 % — ABNORMAL LOW (ref 37.5–51.0)
Hemoglobin: 9.8 g/dL — ABNORMAL LOW (ref 13.0–17.7)
Immature Grans (Abs): 0 10*3/uL (ref 0.0–0.1)
Immature Granulocytes: 1 %
Lymphocytes Absolute: 1.5 10*3/uL (ref 0.7–3.1)
Lymphs: 24 %
MCH: 31.1 pg (ref 26.6–33.0)
MCHC: 32.1 g/dL (ref 31.5–35.7)
MCV: 97 fL (ref 79–97)
Monocytes Absolute: 0.7 10*3/uL (ref 0.1–0.9)
Monocytes: 11 %
Neutrophils Absolute: 4 10*3/uL (ref 1.4–7.0)
Neutrophils: 61 %
Platelets: 430 10*3/uL (ref 150–450)
RBC: 3.15 x10E6/uL — ABNORMAL LOW (ref 4.14–5.80)
RDW: 13.6 % (ref 11.6–15.4)
WBC: 6.4 10*3/uL (ref 3.4–10.8)

## 2019-10-21 LAB — CMP14+EGFR
ALT: 23 IU/L (ref 0–44)
AST: 17 IU/L (ref 0–40)
Albumin/Globulin Ratio: 1.2 (ref 1.2–2.2)
Albumin: 3.4 g/dL — ABNORMAL LOW (ref 3.8–4.9)
Alkaline Phosphatase: 113 IU/L (ref 39–117)
BUN/Creatinine Ratio: 24 — ABNORMAL HIGH (ref 9–20)
BUN: 21 mg/dL (ref 6–24)
Bilirubin Total: <0.2 mg/dL (ref 0.0–1.2)
CO2: 21 mmol/L (ref 20–29)
Calcium: 10 mg/dL (ref 8.7–10.2)
Chloride: 101 mmol/L (ref 96–106)
Creatinine, Ser: 0.87 mg/dL (ref 0.76–1.27)
GFR calc Af Amer: 109 mL/min/{1.73_m2} (ref >59)
GFR calc non Af Amer: 94 mL/min/{1.73_m2} (ref >59)
Globulin, Total: 2.9 g/dL (ref 1.5–4.5)
Glucose: 96 mg/dL (ref 65–99)
Potassium: 4.6 mmol/L (ref 3.5–5.2)
Sodium: 137 mmol/L (ref 134–144)
Total Protein: 6.3 g/dL (ref 6.0–8.5)

## 2019-10-21 MED ORDER — ESCITALOPRAM OXALATE 20 MG PO TABS: 20.0000 mg | ORAL_TABLET | Freq: Every day | ORAL | 4 refills | Status: DC

## 2019-10-21 NOTE — Progress Notes (Signed)
I agree with the above plan 
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
## 2019-10-21 NOTE — Patient Instructions (Signed)
Continue current therapies for secondary stroke prevention.  Recommend initiating Lexapro 20 mg daily due to ongoing depression and anxiety causing insomnia.  After 4 weeks, may need to increase dosage and please call office or discuss further with PCP for possible need of dosage increase      Escitalopram tablets What is this medicine? ESCITALOPRAM (es sye TAL oh pram) is used to treat depression and certain types of anxiety. This medicine may be used for other purposes; ask your health care provider or pharmacist if you have questions. COMMON BRAND NAME(S): Lexapro What should I tell my health care provider before I take this medicine? They need to know if you have any of these conditions:  bipolar disorder or a family history of bipolar disorder  diabetes  glaucoma  heart disease  kidney or liver disease  receiving electroconvulsive therapy  seizures (convulsions)  suicidal thoughts, plans, or attempt by you or a family member  an unusual or allergic reaction to escitalopram, the related drug citalopram, other medicines, foods, dyes, or preservatives  pregnant or trying to become pregnant  breast-feeding How should I use this medicine? Take this medicine by mouth with a glass of water. Follow the directions on the prescription label. You can take it with or without food. If it upsets your stomach, take it with food. Take your medicine at regular intervals. Do not take it more often than directed. Do not stop taking this medicine suddenly except upon the advice of your doctor. Stopping this medicine too quickly may cause serious side effects or your condition may worsen. A special MedGuide will be given to you by the pharmacist with each prescription and refill. Be sure to read this information carefully each time. Talk to your pediatrician regarding the use of this medicine in children. Special care may be needed. Overdosage: If you think you have taken too much of this  medicine contact a poison control center or emergency room at once. NOTE: This medicine is only for you. Do not share this medicine with others. What if I miss a dose? If you miss a dose, take it as soon as you can. If it is almost time for your next dose, take only that dose. Do not take double or extra doses. What may interact with this medicine? Do not take this medicine with any of the following medications:  certain medicines for fungal infections like fluconazole, itraconazole, ketoconazole, posaconazole, voriconazole  cisapride  citalopram  dronedarone  linezolid  MAOIs like Carbex, Eldepryl, Marplan, Nardil, and Parnate  methylene blue (injected into a vein)  pimozide  thioridazine This medicine may also interact with the following medications:  alcohol  amphetamines  aspirin and aspirin-like medicines  carbamazepine  certain medicines for depression, anxiety, or psychotic disturbances  certain medicines for migraine headache like almotriptan, eletriptan, frovatriptan, naratriptan, rizatriptan, sumatriptan, zolmitriptan  certain medicines for sleep  certain medicines that treat or prevent blood clots like warfarin, enoxaparin, dalteparin  cimetidine  diuretics  dofetilide  fentanyl  furazolidone  isoniazid  lithium  metoprolol  NSAIDs, medicines for pain and inflammation, like ibuprofen or naproxen  other medicines that prolong the QT interval (cause an abnormal heart rhythm)  procarbazine  rasagiline  supplements like St. John's wort, kava kava, valerian  tramadol  tryptophan  ziprasidone This list may not describe all possible interactions. Give your health care provider a list of all the medicines, herbs, non-prescription drugs, or dietary supplements you use. Also tell them if you smoke, drink alcohol,  or use illegal drugs. Some items may interact with your medicine. What should I watch for while using this medicine? Tell your  doctor if your symptoms do not get better or if they get worse. Visit your doctor or health care professional for regular checks on your progress. Because it may take several weeks to see the full effects of this medicine, it is important to continue your treatment as prescribed by your doctor. Patients and their families should watch out for new or worsening thoughts of suicide or depression. Also watch out for sudden changes in feelings such as feeling anxious, agitated, panicky, irritable, hostile, aggressive, impulsive, severely restless, overly excited and hyperactive, or not being able to sleep. If this happens, especially at the beginning of treatment or after a change in dose, call your health care professional. Dennis Bast may get drowsy or dizzy. Do not drive, use machinery, or do anything that needs mental alertness until you know how this medicine affects you. Do not stand or sit up quickly, especially if you are an older patient. This reduces the risk of dizzy or fainting spells. Alcohol may interfere with the effect of this medicine. Avoid alcoholic drinks. Your mouth may get dry. Chewing sugarless gum or sucking hard candy, and drinking plenty of water may help. Contact your doctor if the problem does not go away or is severe. What side effects may I notice from receiving this medicine? Side effects that you should report to your doctor or health care professional as soon as possible:  allergic reactions like skin rash, itching or hives, swelling of the face, lips, or tongue  anxious  black, tarry stools  changes in vision  confusion  elevated mood, decreased need for sleep, racing thoughts, impulsive behavior  eye pain  fast, irregular heartbeat  feeling faint or lightheaded, falls  feeling agitated, angry, or irritable  hallucination, loss of contact with reality  loss of balance or coordination  loss of memory  painful or prolonged erections  restlessness, pacing,  inability to keep still  seizures  stiff muscles  suicidal thoughts or other mood changes  trouble sleeping  unusual bleeding or bruising  unusually weak or tired  vomiting Side effects that usually do not require medical attention (report to your doctor or health care professional if they continue or are bothersome):  changes in appetite  change in sex drive or performance  headache  increased sweating  indigestion, nausea  tremors This list may not describe all possible side effects. Call your doctor for medical advice about side effects. You may report side effects to FDA at 1-800-FDA-1088. Where should I keep my medicine? Keep out of reach of children. Store at room temperature between 15 and 30 degrees C (59 and 86 degrees F). Throw away any unused medicine after the expiration date. NOTE: This sheet is a summary. It may not cover all possible information. If you have questions about this medicine, talk to your doctor, pharmacist, or health care provider.  2020 Elsevier/Gold Standard (2018-09-14 11:21:44)

## 2019-11-03 ENCOUNTER — Other Ambulatory Visit: Payer: Self-pay | Admitting: Physical Medicine and Rehabilitation

## 2019-11-04 ENCOUNTER — Encounter (HOSPITAL_COMMUNITY): Payer: Self-pay | Admitting: *Deleted

## 2019-11-04 ENCOUNTER — Other Ambulatory Visit: Payer: Self-pay

## 2019-11-04 ENCOUNTER — Emergency Department (HOSPITAL_COMMUNITY)
Admission: EM | Admit: 2019-11-04 | Discharge: 2019-11-05 | Disposition: A | Discharge status: Home / Self Care | Payer: 59 | Attending: Emergency Medicine | Admitting: Emergency Medicine

## 2019-11-04 ENCOUNTER — Emergency Department (HOSPITAL_COMMUNITY): Payer: 59

## 2019-11-04 DIAGNOSIS — Z79899 Other long term (current) drug therapy: Secondary | ICD-10-CM | POA: Diagnosis not present

## 2019-11-04 DIAGNOSIS — Z8673 Personal history of transient ischemic attack (TIA), and cerebral infarction without residual deficits: Secondary | ICD-10-CM | POA: Diagnosis not present

## 2019-11-04 DIAGNOSIS — K654 Sclerosing mesenteritis: Secondary | ICD-10-CM | POA: Diagnosis not present

## 2019-11-04 DIAGNOSIS — R112 Nausea with vomiting, unspecified: Secondary | ICD-10-CM | POA: Diagnosis not present

## 2019-11-04 DIAGNOSIS — R1032 Left lower quadrant pain: Secondary | ICD-10-CM | POA: Diagnosis present

## 2019-11-04 DIAGNOSIS — Z933 Colostomy status: Secondary | ICD-10-CM | POA: Insufficient documentation

## 2019-11-04 DIAGNOSIS — I10 Essential (primary) hypertension: Secondary | ICD-10-CM | POA: Insufficient documentation

## 2019-11-04 DIAGNOSIS — N201 Calculus of ureter: Secondary | ICD-10-CM | POA: Insufficient documentation

## 2019-11-04 LAB — CBC WITH DIFFERENTIAL/PLATELET
Abs Immature Granulocytes: 0.08 10*3/uL — ABNORMAL HIGH (ref 0.00–0.07)
Basophils Absolute: 0.1 10*3/uL (ref 0.0–0.1)
Basophils Relative: 0 %
Eosinophils Absolute: 0.2 10*3/uL (ref 0.0–0.5)
Eosinophils Relative: 1 %
HCT: 36.3 % — ABNORMAL LOW (ref 39.0–52.0)
Hemoglobin: 11.4 g/dL — ABNORMAL LOW (ref 13.0–17.0)
Immature Granulocytes: 1 %
Lymphocytes Relative: 11 %
Lymphs Abs: 1.8 10*3/uL (ref 0.7–4.0)
MCH: 30.9 pg (ref 26.0–34.0)
MCHC: 31.4 g/dL (ref 30.0–36.0)
MCV: 98.4 fL (ref 80.0–100.0)
Monocytes Absolute: 0.8 10*3/uL (ref 0.1–1.0)
Monocytes Relative: 5 %
Neutro Abs: 13.1 10*3/uL — ABNORMAL HIGH (ref 1.7–7.7)
Neutrophils Relative %: 82 %
Platelets: 577 10*3/uL — ABNORMAL HIGH (ref 150–400)
RBC: 3.69 MIL/uL — ABNORMAL LOW (ref 4.22–5.81)
RDW: 13.8 % (ref 11.5–15.5)
WBC: 16 10*3/uL — ABNORMAL HIGH (ref 4.0–10.5)
nRBC: 0 % (ref 0.0–0.2)

## 2019-11-04 LAB — COMPREHENSIVE METABOLIC PANEL
ALT: 25 U/L (ref 0–44)
AST: 22 U/L (ref 15–41)
Albumin: 3.9 g/dL (ref 3.5–5.0)
Alkaline Phosphatase: 97 U/L (ref 38–126)
Anion gap: 8 (ref 5–15)
BUN: 29 mg/dL — ABNORMAL HIGH (ref 6–20)
CO2: 24 mmol/L (ref 22–32)
Calcium: 9.9 mg/dL (ref 8.9–10.3)
Chloride: 104 mmol/L (ref 98–111)
Creatinine, Ser: 0.99 mg/dL (ref 0.61–1.24)
GFR calc Af Amer: >60 mL/min (ref >60)
GFR calc non Af Amer: >60 mL/min (ref >60)
Glucose, Bld: 134 mg/dL — ABNORMAL HIGH (ref 70–99)
Potassium: 4.2 mmol/L (ref 3.5–5.1)
Sodium: 136 mmol/L (ref 135–145)
Total Bilirubin: 0.4 mg/dL (ref 0.3–1.2)
Total Protein: 8.4 g/dL — ABNORMAL HIGH (ref 6.5–8.1)

## 2019-11-04 LAB — LIPASE, BLOOD: Lipase: 43 U/L (ref 11–51)

## 2019-11-04 MED ORDER — SODIUM CHLORIDE 0.9 % IV BOLUS
1000.0000 mL | Freq: Once | INTRAVENOUS | Status: AC
  Administered 2019-11-04: 23:00:00 1000 mL via INTRAVENOUS

## 2019-11-04 MED ORDER — MORPHINE SULFATE (PF) 4 MG/ML IV SOLN
4.0000 mg | Freq: Once | INTRAVENOUS | Status: AC
  Administered 2019-11-04: 4 mg via INTRAVENOUS
  Filled 2019-11-04: qty 1

## 2019-11-04 MED ORDER — HYDROCODONE-ACETAMINOPHEN 5-325 MG PO TABS: 1.0000 | ORAL_TABLET | Freq: Four times a day (QID) | ORAL | 0 refills | Status: DC | PRN

## 2019-11-04 MED ORDER — TAMSULOSIN HCL 0.4 MG PO CAPS: 0.4000 mg | ORAL_CAPSULE | Freq: Every day | ORAL | 0 refills | Status: DC

## 2019-11-04 MED ORDER — HYDROMORPHONE HCL 1 MG/ML IJ SOLN
1.0000 mg | Freq: Once | INTRAMUSCULAR | Status: AC
  Administered 2019-11-04: 1 mg via INTRAVENOUS
  Filled 2019-11-04: qty 1

## 2019-11-04 MED ORDER — ONDANSETRON HCL 4 MG/2ML IJ SOLN
4.0000 mg | Freq: Once | INTRAMUSCULAR | Status: AC
  Administered 2019-11-04: 23:00:00 4 mg via INTRAVENOUS
  Filled 2019-11-04: qty 2

## 2019-11-04 MED ORDER — ONDANSETRON HCL 4 MG PO TABS: 4.0000 mg | ORAL_TABLET | Freq: Four times a day (QID) | ORAL | 0 refills | Status: DC

## 2019-11-04 NOTE — Telephone Encounter (Deleted)
Recieved electronic medication refill request for quent

## 2019-11-04 NOTE — ED Notes (Signed)
Pt transported to CT ?

## 2019-11-04 NOTE — ED Triage Notes (Signed)
Pt with sudden left flank pain since 1700 today with N/V.  Pt with hx of kidney stones.

## 2019-11-04 NOTE — Discharge Instructions (Addendum)
Today you were diagnosed with a kidney stone on your CT scan.  You will be given a prescription for Flomax, pain medication, and nausea medication.  You should not drive, work, or operate machinery while taking the pain medication as it can make you very drowsy.   A culture was sent of your urine today to determine if there is any bacterial growth. If the results of the culture are positive and you require an antibiotic or a change of your prescribed antibiotic you will be contacted by the hospital. If the results are negative you will not be contacted.  You will need to follow-up with urology for reevaluation and for further treatment of your kidney stone.  You will need to return to the emergency department for any fevers, persistent pain, persistent vomiting, inability to urinate, or any new or worsening symptoms.
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
## 2019-11-04 NOTE — ED Notes (Signed)
ED Provider at bedside. 

## 2019-11-05 ENCOUNTER — Encounter: Payer: 59 | Admitting: Physical Medicine and Rehabilitation

## 2019-11-05 LAB — URINALYSIS, ROUTINE W REFLEX MICROSCOPIC
Bacteria, UA: NONE SEEN
Bilirubin Urine: NEGATIVE
Glucose, UA: NEGATIVE mg/dL
Ketones, ur: NEGATIVE mg/dL
Leukocytes,Ua: NEGATIVE
Nitrite: NEGATIVE
Protein, ur: 30 mg/dL — AB
RBC / HPF: >50 RBC/hpf — ABNORMAL HIGH (ref 0–5)
Specific Gravity, Urine: 1.017 (ref 1.005–1.030)
pH: 5 (ref 5.0–8.0)

## 2019-11-05 MED ORDER — KETOROLAC TROMETHAMINE 30 MG/ML IJ SOLN
30.0000 mg | Freq: Once | INTRAMUSCULAR | Status: AC
  Administered 2019-11-05: 02:00:00 30 mg via INTRAVENOUS
  Filled 2019-11-05: qty 1

## 2019-11-05 MED ORDER — HYDROCODONE-ACETAMINOPHEN 5-325 MG PO TABS: 1.0000 | ORAL_TABLET | ORAL | 0 refills | Status: DC | PRN

## 2019-11-06 LAB — URINE CULTURE: Culture: NO GROWTH

## 2019-11-08 MED FILL — Hydrocodone-Acetaminophen Tab 5-325 MG: ORAL | Qty: 6 | Status: AC

## 2019-11-11 ENCOUNTER — Other Ambulatory Visit: Payer: Self-pay | Admitting: Physical Medicine and Rehabilitation

## 2019-11-12 NOTE — Telephone Encounter (Signed)
Refill request for Zolpidem 5 mg and Quetiapine 25 mg. No mention in last note to continue. Is it okay to refill?

## 2019-11-12 NOTE — Telephone Encounter (Signed)
Called pharmacy, refill denied until appt made.

## 2019-11-12 NOTE — Telephone Encounter (Signed)
Patient has missed two follow-up appointments. He will need to have follow-up appointment prior to refill to assess whether he still needs the medication.

## 2019-11-17 ENCOUNTER — Other Ambulatory Visit: Payer: Self-pay | Admitting: *Deleted

## 2019-11-17 MED ORDER — ESCITALOPRAM OXALATE 20 MG PO TABS: 20.0000 mg | ORAL_TABLET | Freq: Every day | ORAL | 0 refills | Status: DC

## 2019-11-18 ENCOUNTER — Other Ambulatory Visit: Payer: Self-pay | Admitting: Surgery

## 2019-11-18 ENCOUNTER — Encounter: Payer: Self-pay | Admitting: Physical Medicine and Rehabilitation

## 2019-11-18 ENCOUNTER — Encounter: Payer: 59 | Attending: Physical Medicine and Rehabilitation | Admitting: Physical Medicine and Rehabilitation

## 2019-11-18 ENCOUNTER — Other Ambulatory Visit: Payer: Self-pay

## 2019-11-18 ENCOUNTER — Ambulatory Visit: Payer: Self-pay | Admitting: Surgery

## 2019-11-18 VITALS — BP 120/96 | HR 99 | Temp 97.5°F | Ht 70.0 in | Wt 137.0 lb

## 2019-11-18 DIAGNOSIS — D509 Iron deficiency anemia, unspecified: Secondary | ICD-10-CM | POA: Diagnosis not present

## 2019-11-18 DIAGNOSIS — S72351A Displaced comminuted fracture of shaft of right femur, initial encounter for closed fracture: Secondary | ICD-10-CM | POA: Diagnosis not present

## 2019-11-18 DIAGNOSIS — Z8042 Family history of malignant neoplasm of prostate: Secondary | ICD-10-CM | POA: Diagnosis not present

## 2019-11-18 DIAGNOSIS — Z8249 Family history of ischemic heart disease and other diseases of the circulatory system: Secondary | ICD-10-CM | POA: Diagnosis not present

## 2019-11-18 DIAGNOSIS — G8918 Other acute postprocedural pain: Secondary | ICD-10-CM

## 2019-11-18 DIAGNOSIS — I6330 Cerebral infarction due to thrombosis of unspecified cerebral artery: Secondary | ICD-10-CM

## 2019-11-18 MED ORDER — ZOLPIDEM TARTRATE 5 MG PO TABS: 5.0000 mg | ORAL_TABLET | Freq: Every morning | ORAL | 1 refills | Status: DC

## 2019-11-18 MED ORDER — CELECOXIB 200 MG PO CAPS: 200.0000 mg | ORAL_CAPSULE | Freq: Two times a day (BID) | ORAL | 1 refills | Status: DC
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
## 2019-11-19 LAB — IRON: Iron: 36 ug/dL — ABNORMAL LOW (ref 38–169)

## 2019-11-19 MED ORDER — IRON 325 (65 FE) MG PO TABS: 1.0000 | ORAL_TABLET | Freq: Every day | ORAL | 0 refills | Status: DC

## 2019-11-19 NOTE — Addendum Note (Signed)
Addended by: Izora Ribas on: 11/19/2019 08:43 AM   Modules accepted: Orders

## 2019-11-22 ENCOUNTER — Telehealth: Payer: Self-pay | Admitting: Family

## 2019-11-22 NOTE — Telephone Encounter (Signed)
Offered visit or phone visit- will call back to let us know if they want to schedule.

## 2019-11-24 ENCOUNTER — Telehealth: Payer: Self-pay | Admitting: *Deleted

## 2019-11-24 NOTE — Telephone Encounter (Signed)
Patient left a message that [facility] therapist recommends that he continue with outpatient therapies.  He would like those orders to be sent to [facility].

## 2019-11-25 ENCOUNTER — Other Ambulatory Visit: Payer: Self-pay | Admitting: Physical Medicine and Rehabilitation

## 2019-11-25 DIAGNOSIS — I6330 Cerebral infarction due to thrombosis of unspecified cerebral artery: Secondary | ICD-10-CM

## 2019-11-29 ENCOUNTER — Telehealth: Payer: Self-pay | Admitting: Physical Medicine and Rehabilitation

## 2019-11-29 NOTE — Telephone Encounter (Signed)
Patient needs a new referral for outpatient therapy.  Original order was sent to [facility] in [place] and they do not do PT and OT.  Please call patient.

## 2019-12-01 NOTE — Telephone Encounter (Signed)
Spoke to pt and he states he is going to go to [facility] in [place] on [address]. No referral needed at this time. Patient will call back if needed.

## 2019-12-10 ENCOUNTER — Telehealth: Payer: Self-pay | Admitting: Physical Medicine and Rehabilitation

## 2019-12-10 NOTE — Telephone Encounter (Signed)
Can refill, thank you!

## 2019-12-10 NOTE — Telephone Encounter (Signed)
Recieved electronic medication refill request for Celebrex.  According to last note dated 11-18-2019:  His wife asks for prn pain medication if he were to need it. I have prescribed 15 tabs of Celecoxib which provided him relief in past, with 1 refill Discussed side effects and checked hemoglobin and creatinine before prescribing.   Based on this information I do not know whether I can or cannot issue a refill for this patient.  Please advise.

## 2019-12-11 ENCOUNTER — Other Ambulatory Visit: Payer: Self-pay | Admitting: Physical Medicine and Rehabilitation

## 2019-12-19 ENCOUNTER — Other Ambulatory Visit: Payer: Self-pay | Admitting: Physical Medicine and Rehabilitation

## 2019-12-22 ENCOUNTER — Ambulatory Visit (INDEPENDENT_AMBULATORY_CARE_PROVIDER_SITE_OTHER): Payer: 59 | Admitting: Adult Health

## 2019-12-22 ENCOUNTER — Other Ambulatory Visit: Payer: Self-pay

## 2019-12-22 ENCOUNTER — Encounter: Payer: Self-pay | Admitting: Adult Health

## 2019-12-22 VITALS — BP 118/88 | HR 88 | Temp 97.8°F | Ht 70.0 in | Wt 137.2 lb

## 2019-12-22 DIAGNOSIS — I10 Essential (primary) hypertension: Secondary | ICD-10-CM | POA: Diagnosis not present

## 2019-12-22 DIAGNOSIS — I6350 Cerebral infarction due to unspecified occlusion or stenosis of unspecified cerebral artery: Secondary | ICD-10-CM

## 2019-12-22 DIAGNOSIS — G8194 Hemiplegia, unspecified affecting left nondominant side: Secondary | ICD-10-CM | POA: Diagnosis not present

## 2019-12-22 DIAGNOSIS — R490 Dysphonia: Secondary | ICD-10-CM
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
## 2020-01-06 ENCOUNTER — Other Ambulatory Visit (HOSPITAL_COMMUNITY): Payer: 59

## 2020-01-07 ENCOUNTER — Other Ambulatory Visit (HOSPITAL_COMMUNITY)
Admission: RE | Admit: 2020-01-07 | Discharge: 2020-01-07 | Disposition: A | Discharge status: Home / Self Care | Payer: 59 | Source: Ambulatory Visit | Attending: Surgery | Admitting: Surgery

## 2020-01-07 DIAGNOSIS — Z01812 Encounter for preprocedural laboratory examination: Secondary | ICD-10-CM | POA: Insufficient documentation

## 2020-01-07 DIAGNOSIS — Z20822 Contact with and (suspected) exposure to covid-19: Secondary | ICD-10-CM | POA: Insufficient documentation

## 2020-01-07 LAB — SARS CORONAVIRUS 2 (TAT 6-24 HRS): SARS Coronavirus 2: NEGATIVE

## 2020-01-07 NOTE — Progress Notes (Signed)
I agree with the above plan 

## 2020-01-10 ENCOUNTER — Ambulatory Visit (HOSPITAL_COMMUNITY): Payer: 59 | Admitting: Anesthesiology

## 2020-01-11 ENCOUNTER — Encounter (HOSPITAL_COMMUNITY): Payer: Self-pay | Admitting: Surgery

## 2020-01-11 ENCOUNTER — Ambulatory Visit (HOSPITAL_COMMUNITY)
Admission: RE | Admit: 2020-01-11 | Discharge: 2020-01-11 | Disposition: A | Discharge status: Home / Self Care | Payer: 59 | Attending: Surgery | Admitting: Surgery

## 2020-01-11 ENCOUNTER — Other Ambulatory Visit: Payer: Self-pay

## 2020-01-11 ENCOUNTER — Encounter (HOSPITAL_COMMUNITY)
Admission: RE | Disposition: A | Discharge status: Home / Self Care | Payer: Self-pay | Source: Home / Self Care | Attending: Surgery

## 2020-01-11 DIAGNOSIS — Z9049 Acquired absence of other specified parts of digestive tract: Secondary | ICD-10-CM | POA: Insufficient documentation

## 2020-01-11 DIAGNOSIS — Z5309 Procedure and treatment not carried out because of other contraindication: Secondary | ICD-10-CM | POA: Diagnosis not present

## 2020-01-11 DIAGNOSIS — I10 Essential (primary) hypertension: Secondary | ICD-10-CM | POA: Diagnosis not present

## 2020-01-11 DIAGNOSIS — Z8042 Family history of malignant neoplasm of prostate: Secondary | ICD-10-CM | POA: Diagnosis not present

## 2020-01-11 DIAGNOSIS — N200 Calculus of kidney: Secondary | ICD-10-CM | POA: Insufficient documentation

## 2020-01-11 DIAGNOSIS — Z8249 Family history of ischemic heart disease and other diseases of the circulatory system: Secondary | ICD-10-CM | POA: Diagnosis not present

## 2020-01-11 DIAGNOSIS — Z932 Ileostomy status: Secondary | ICD-10-CM | POA: Insufficient documentation

## 2020-01-11 LAB — ALBUMIN: Albumin: 4.1 g/dL (ref 3.5–5.0)

## 2020-01-11 SURGERY — CANCELLED PROCEDURE

## 2020-01-11 NOTE — Progress Notes (Signed)
Patient had kidney stone issues. Nausea, vomiting, and pain day of procedure. Case cancelled and rescheduled, per Dr. Bobbye Morton and Dr. Kerin Perna.
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
## 2020-01-14 ENCOUNTER — Other Ambulatory Visit: Payer: Self-pay | Admitting: Physical Medicine and Rehabilitation

## 2020-01-15 ENCOUNTER — Other Ambulatory Visit: Payer: Self-pay | Admitting: Physical Medicine and Rehabilitation

## 2020-01-17 ENCOUNTER — Ambulatory Visit: Payer: 59 | Admitting: Family

## 2020-01-18 ENCOUNTER — Ambulatory Visit: Payer: 59 | Admitting: Family

## 2020-01-21 ENCOUNTER — Other Ambulatory Visit: Payer: Self-pay

## 2020-01-21 ENCOUNTER — Encounter: Payer: Self-pay | Admitting: Physical Medicine and Rehabilitation

## 2020-01-21 ENCOUNTER — Encounter: Payer: 59 | Attending: Physical Medicine and Rehabilitation | Admitting: Physical Medicine and Rehabilitation

## 2020-01-21 VITALS — BP 123/86 | HR 85 | Temp 97.9°F | Ht 70.0 in | Wt 141.2 lb

## 2020-01-21 DIAGNOSIS — T07XXXA Unspecified multiple injuries, initial encounter: Secondary | ICD-10-CM

## 2020-01-21 DIAGNOSIS — I6330 Cerebral infarction due to thrombosis of unspecified cerebral artery: Secondary | ICD-10-CM | POA: Diagnosis not present

## 2020-01-21 DIAGNOSIS — N200 Calculus of kidney: Secondary | ICD-10-CM

## 2020-01-21 DIAGNOSIS — D509 Iron deficiency anemia, unspecified: Secondary | ICD-10-CM | POA: Insufficient documentation

## 2020-01-21 DIAGNOSIS — Z8249 Family history of ischemic heart disease and other diseases of the circulatory system: Secondary | ICD-10-CM | POA: Diagnosis not present

## 2020-01-21 DIAGNOSIS — Z8042 Family history of malignant neoplasm of prostate: Secondary | ICD-10-CM | POA: Diagnosis not present

## 2020-01-21 NOTE — Patient Instructions (Signed)
Cleared for sexual activity  You are progressing very well! We are so proud of you
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
## 2020-01-27 ENCOUNTER — Ambulatory Visit (INDEPENDENT_AMBULATORY_CARE_PROVIDER_SITE_OTHER): Payer: 59 | Admitting: Family

## 2020-01-27 ENCOUNTER — Other Ambulatory Visit: Payer: Self-pay

## 2020-01-27 ENCOUNTER — Encounter: Payer: Self-pay | Admitting: Family

## 2020-01-27 VITALS — BP 124/90 | HR 78 | Temp 97.2°F | Ht 70.0 in | Wt 142.0 lb

## 2020-01-27 DIAGNOSIS — I6930 Unspecified sequelae of cerebral infarction: Secondary | ICD-10-CM | POA: Diagnosis not present

## 2020-01-27 DIAGNOSIS — I10 Essential (primary) hypertension: Secondary | ICD-10-CM

## 2020-01-27 DIAGNOSIS — E785 Hyperlipidemia, unspecified: Secondary | ICD-10-CM | POA: Diagnosis not present

## 2020-01-27 DIAGNOSIS — S3590XD Unspecified injury of unspecified blood vessel at abdomen, lower back and pelvis level, subsequent encounter: Secondary | ICD-10-CM | POA: Diagnosis not present

## 2020-01-27 DIAGNOSIS — S3590XA Unspecified injury of unspecified blood vessel at abdomen, lower back and pelvis level, initial encounter: Secondary | ICD-10-CM

## 2020-01-27 MED ORDER — AMLODIPINE BESY-BENAZEPRIL HCL 5-20 MG PO CAPS: 1.0000 | ORAL_CAPSULE | Freq: Every day | ORAL | 1 refills | Status: DC

## 2020-01-27 MED ORDER — ATORVASTATIN CALCIUM 10 MG PO TABS: 10.0000 mg | ORAL_TABLET | Freq: Every day | ORAL | 3 refills | Status: DC
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
## 2020-01-27 NOTE — Patient Instructions (Signed)

## 2020-01-28 ENCOUNTER — Other Ambulatory Visit (HOSPITAL_COMMUNITY)
Admission: RE | Admit: 2020-01-28 | Discharge: 2020-01-28 | Disposition: A | Discharge status: Home / Self Care | Payer: 59 | Source: Ambulatory Visit | Attending: Surgery | Admitting: Surgery

## 2020-01-28 DIAGNOSIS — Z01812 Encounter for preprocedural laboratory examination: Secondary | ICD-10-CM | POA: Insufficient documentation

## 2020-01-28 DIAGNOSIS — Z20822 Contact with and (suspected) exposure to covid-19: Secondary | ICD-10-CM | POA: Insufficient documentation

## 2020-01-28 LAB — CMP14+EGFR
ALT: 26 IU/L (ref 0–44)
AST: 21 IU/L (ref 0–40)
Albumin/Globulin Ratio: 1.2 (ref 1.2–2.2)
Albumin: 4.2 g/dL (ref 3.8–4.9)
Alkaline Phosphatase: 112 IU/L (ref 39–117)
BUN/Creatinine Ratio: 13 (ref 9–20)
BUN: 13 mg/dL (ref 6–24)
Bilirubin Total: <0.2 mg/dL (ref 0.0–1.2)
CO2: 23 mmol/L (ref 20–29)
Calcium: 9.9 mg/dL (ref 8.7–10.2)
Chloride: 100 mmol/L (ref 96–106)
Creatinine, Ser: 0.98 mg/dL (ref 0.76–1.27)
GFR calc Af Amer: 97 mL/min/{1.73_m2} (ref >59)
GFR calc non Af Amer: 84 mL/min/{1.73_m2} (ref >59)
Globulin, Total: 3.5 g/dL (ref 1.5–4.5)
Glucose: 91 mg/dL (ref 65–99)
Potassium: 4.6 mmol/L (ref 3.5–5.2)
Sodium: 137 mmol/L (ref 134–144)
Total Protein: 7.7 g/dL (ref 6.0–8.5)

## 2020-01-28 LAB — SARS CORONAVIRUS 2 (TAT 6-24 HRS): SARS Coronavirus 2: NEGATIVE

## 2020-01-28 LAB — CBC WITH DIFFERENTIAL/PLATELET
Basophils Absolute: 0.1 10*3/uL (ref 0.0–0.2)
Basos: 0 %
EOS (ABSOLUTE): 0.1 10*3/uL (ref 0.0–0.4)
Eos: 1 %
Hematocrit: 35.4 % — ABNORMAL LOW (ref 37.5–51.0)
Hemoglobin: 12.3 g/dL — ABNORMAL LOW (ref 13.0–17.7)
Immature Grans (Abs): 0 10*3/uL (ref 0.0–0.1)
Immature Granulocytes: 0 %
Lymphocytes Absolute: 2 10*3/uL (ref 0.7–3.1)
Lymphs: 17 %
MCH: 31.4 pg (ref 26.6–33.0)
MCHC: 34.7 g/dL (ref 31.5–35.7)
MCV: 90 fL (ref 79–97)
Monocytes Absolute: 1.1 10*3/uL — ABNORMAL HIGH (ref 0.1–0.9)
Monocytes: 9 %
Neutrophils Absolute: 8.8 10*3/uL — ABNORMAL HIGH (ref 1.4–7.0)
Neutrophils: 73 %
Platelets: 400 10*3/uL (ref 150–450)
RBC: 3.92 x10E6/uL — ABNORMAL LOW (ref 4.14–5.80)
RDW: 13.9 % (ref 11.6–15.4)
WBC: 12.2 10*3/uL — ABNORMAL HIGH (ref 3.4–10.8)

## 2020-02-01 ENCOUNTER — Ambulatory Visit (HOSPITAL_COMMUNITY)
Admission: RE | Admit: 2020-02-01 | Discharge: 2020-02-01 | Disposition: A | Discharge status: Home / Self Care | Payer: 59 | Attending: Surgery | Admitting: Surgery

## 2020-02-01 ENCOUNTER — Ambulatory Visit (HOSPITAL_COMMUNITY): Payer: 59 | Admitting: Certified Registered"

## 2020-02-01 ENCOUNTER — Other Ambulatory Visit: Payer: Self-pay

## 2020-02-01 ENCOUNTER — Encounter (HOSPITAL_COMMUNITY)
Admission: RE | Disposition: A | Discharge status: Home / Self Care | Payer: Self-pay | Source: Home / Self Care | Attending: Surgery

## 2020-02-01 DIAGNOSIS — Z8249 Family history of ischemic heart disease and other diseases of the circulatory system: Secondary | ICD-10-CM | POA: Diagnosis not present

## 2020-02-01 DIAGNOSIS — Z1211 Encounter for screening for malignant neoplasm of colon: Secondary | ICD-10-CM | POA: Insufficient documentation

## 2020-02-01 DIAGNOSIS — Z9049 Acquired absence of other specified parts of digestive tract: Secondary | ICD-10-CM | POA: Diagnosis not present

## 2020-02-01 DIAGNOSIS — I10 Essential (primary) hypertension: Secondary | ICD-10-CM | POA: Insufficient documentation

## 2020-02-01 HISTORY — PX: FLEXIBLE SIGMOIDOSCOPY: SHX5431

## 2020-02-01 SURGERY — SIGMOIDOSCOPY, FLEXIBLE
Anesthesia: Monitor Anesthesia Care

## 2020-02-01 MED ORDER — FLEET ENEMA 7-19 GM/118ML RE ENEM: 1.0000 | ENEMA | Freq: Once | RECTAL | Status: DC

## 2020-02-01 MED ORDER — CHLORHEXIDINE GLUCONATE CLOTH 2 % EX PADS: 6.0000 | MEDICATED_PAD | Freq: Once | CUTANEOUS | Status: DC

## 2020-02-01 MED ORDER — FLEET ENEMA 7-19 GM/118ML RE ENEM
ENEMA | RECTAL | Status: AC
  Filled 2020-02-01: qty 1

## 2020-02-01 MED ORDER — LACTATED RINGERS IV SOLN
INTRAVENOUS | Status: DC
  Administered 2020-02-01: 100 mL via INTRAVENOUS

## 2020-02-01 MED ORDER — PROPOFOL 500 MG/50ML IV EMUL
INTRAVENOUS | Status: DC | PRN
  Administered 2020-02-01: 135 ug/kg/min via INTRAVENOUS

## 2020-02-01 MED ORDER — PROPOFOL 10 MG/ML IV BOLUS
INTRAVENOUS | Status: DC | PRN
  Administered 2020-02-01: 10 mg via INTRAVENOUS
  Administered 2020-02-01: 20 mg via INTRAVENOUS
  Administered 2020-02-01: 10 mg via INTRAVENOUS
  Administered 2020-02-01: 20 mg via INTRAVENOUS

## 2020-02-01 NOTE — H&P (Signed)
Reason for Consult/Chief Complaint: screening colonoscopy  [name] is an 59 y.o. male.   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 Presents today for screening colonoscopy prior to ileostomy reversal. The patient has had no hospitalizations, ER visits, surgeries, or newly diagnosed allergies since being seen in clinic. He was seen by his PCP for kidney stones since our last encounter. No additional concerns.    Past Medical History:  Diagnosis Date  . History of kidney stones   . Hypertension   . Kidney stone   . Kidney stones   . Low testosterone     Past Surgical History:  Procedure Laterality Date  . APPLICATION OF WOUND VAC  07/29/2019   Procedure: Application Of Wound Vac;  Surgeon: Georganna Skeans, MD;  Location: [place];  Service: General;;  . APPLICATION OF WOUND VAC  07/31/2019   Procedure: Application Of Wound Vac;  Surgeon: Jesusita Oka, MD;  Location: [place];  Service: General;;  . bone spur surgery Right   . BOWEL RESECTION N/A 07/28/2019   Procedure: Small Bowel Resection extended illeosintectomy;  Surgeon: Jossiah Boston, MD;  Location: [place];  Service: General;  Laterality: N/A;  . CLOSED REDUCTION WRIST FRACTURE Left 07/28/2019   Procedure: Closed Reduction Wrist;  Surgeon: Meredith Pel, MD;  Location: [place];  Service: Orthopedics;  Laterality: Left;  . EXTRACORPOREAL SHOCK WAVE LITHOTRIPSY Left 04/16/2018   Procedure: LEFT EXTRACORPOREAL SHOCK WAVE LITHOTRIPSY (ESWL);  Surgeon: Cleon Gustin, MD;  Location: [facility];  Service: Urology;  Laterality: Left;  . EYE SURGERY    . FEMUR IM NAIL Right 07/28/2019   Procedure: INTRAMEDULLARY (IM) NAIL FEMORAL;  Surgeon: Meredith Pel, MD;  Location: [place];  Service: Orthopedics;  Laterality: Right;  . IRRIGATION AND DEBRIDEMENT KNEE Left 07/28/2019   Procedure: Irrigation And Debridement  Left Knee;  Surgeon: Meredith Pel, MD;  Location: [place];  Service: Orthopedics;   Laterality: Left;  . KNEE CLOSED REDUCTION Right 09/10/2019   Procedure: CLOSED MANIPULATION KNEE;  Surgeon: Shona Needles, MD;  Location: [place];  Service: Orthopedics;  Laterality: Right;  . LAPAROTOMY N/A 07/29/2019   Procedure: EXPLORATORY LAPAROTOMY, ileocecectomy;  Surgeon: Georganna Skeans, MD;  Location: [place];  Service: General;  Laterality: N/A;  . LAPAROTOMY N/A 07/28/2019   Procedure: EXPLORATORY LAPAROTOMY WITH MESENTERIC REPAIR;  Surgeon: Axel Boston, MD;  Location: [place];  Service: General;  Laterality: N/A;  . LAPAROTOMY N/A 07/31/2019   Procedure: RE-EXPLORATORY LAPAROTOMY, RESECTION OF TRANSVERSE, LEFT, AND SIGMOID COLON, CREATION OF ILEOSTOMY,  PRIMARY FASCIAL CLOSURE;  Surgeon: Jesusita Oka, MD;  Location: [place];  Service: General;  Laterality: N/A;  . LITHOTRIPSY    . ORIF ANKLE FRACTURE Right 08/04/2019   Procedure: Open Reduction Internal Fixation (Orif) Ankle Fracture;  Surgeon: Shona Needles, MD;  Location: [place];  Service: Orthopedics;  Laterality: Right;  . ORIF CLAVICULAR FRACTURE Left 08/04/2019   Procedure: OPEN REDUCTION INTERNAL FIXATION (ORIF) CLAVICULAR FRACTURE;  Surgeon: Shona Needles, MD;  Location: [place];  Service: Orthopedics;  Laterality: Left;  . ORIF WRIST FRACTURE Left 08/04/2019   Procedure: OPEN REDUCTION INTERNAL FIXATION (ORIF) WRIST FRACTURE;  Surgeon: Shona Needles, MD;  Location: [place];  Service: Orthopedics;  Laterality: Left;    Family History  Problem Relation Age of Onset  . Hypertension Mother   . Cancer Father        prostate  . Hypertension Father  Social History:  reports that he has never smoked. He has never used smokeless tobacco. He reports that he does not drink alcohol or use drugs.  Allergies: No Known Allergies  Medications: I have reviewed the patient's current medications.  No results found for this or any previous visit (from the past 48 hour(s)).  No results found.  ROS 10 point review of systems  is negative except as listed above in HPI.   Physical Exam Blood pressure 130/79, pulse (!) 107, temperature 98.7 F (37.1 C), temperature source Oral, resp. rate 12, height 5\' 10"  (1.778 m), weight 65.3 kg, SpO2 100 %. Constitutional: well-developed, well-nourished HEENT: pupils equal, round, reactive to light, 76mm b/l, moist conjunctiva, external inspection of ears and nose normal, hearing intact Oropharynx: normal oropharyngeal mucosa, normal dentition Neck: no thyromegaly, trachea midline, no midline cervical tenderness to palpation Chest: breath sounds equal bilaterally, normal respiratory effort, no midline or lateral chest wall tenderness to palpation/deformity Abdomen: soft, NT, no bruising, no hepatosplenomegaly, ileostomy PPP GU: deferred  Back: no wounds, no thoracic/lumbar spine tenderness to palpation, no thoracic/lumbar spine stepoffs Rectal: deferred Extremities: 2+ radial and pedal pulses bilaterally, motor and sensation intact to bilateral UE and LE, no peripheral edema MSK: normal gait/station, no clubbing/cyanosis of fingers/toes, normal ROM of all four extremities Skin: warm, dry, no rashes Psych: normal memory, normal mood/affect    Assessment/Plan: 82M s/p sub-total colectomy and ileostomy for trauma. Presents today for colonoscopy prior to ileostomy reversal. Informed consent was obtained after detailed explanation of risks, including bleeding and perforation. All questions answered to the patient's satisfaction.   Jesusita Oka, MD General and [facility]

## 2020-02-01 NOTE — Discharge Instructions (Signed)
Flexible Sigmoidoscopy, Care After This sheet gives you information about how to care for yourself after your procedure. Your health care provider may also give you more specific instructions. If you have problems or questions, contact your health care provider. What can I expect after the procedure? After the procedure, it is common to have:  Abdominal cramping or pain.  Bloating.  A small amount of rectal bleeding if you had a biopsy. Follow these instructions at home:  Take over-the-counter and prescription medicines only as told by your health care provider.  Do not drive for 24 hours if you received a medicine to help you relax (sedative).  Keep all follow-up visits as told by your health care provider. This is important. Contact a health care provider if:  You have abdominal pain or cramping that gets worse or is not helped with medicine.  You continue to have small amounts of rectal bleeding after 24 hours.  You have nausea or vomiting.  You feel weak or dizzy.  You have a fever. Get help right away if:  You pass large blood clots or see a large amount of blood in the toilet after having a bowel movement.  You have nausea or vomiting for more than 24 hours after the procedure. This information is not intended to replace advice given to you by your health care provider. Make sure you discuss any questions you have with your health care provider. Document Revised: 05/16/2016 Document Reviewed: 12/23/2015 Elsevier Patient Education  2020 [employer]  

## 2020-02-01 NOTE — Anesthesia Preprocedure Evaluation (Addendum)
Anesthesia Evaluation  Patient identified by MRN, date of birth, ID band Patient awake    Reviewed: Allergy & Precautions, NPO status , Patient's Chart, lab work & pertinent test results  Airway Mallampati: II  TM Distance: >3 FB Neck ROM: Full    Dental no notable dental hx.    Pulmonary neg pulmonary ROS,    Pulmonary exam normal breath sounds clear to auscultation       Cardiovascular hypertension, Pt. on medications Normal cardiovascular exam Rhythm:Regular Rate:Normal  ECG: ST, rate 116   Neuro/Psych PSYCHIATRIC DISORDERS Anxiety Depression CVA (Left sided weakness ), Residual Symptoms    GI/Hepatic negative GI ROS, Neg liver ROS,   Endo/Other  negative endocrine ROS  Renal/GU negative Renal ROS     Musculoskeletal negative musculoskeletal ROS (+)   Abdominal   Peds  Hematology  (+) anemia , HLD   Anesthesia Other Findings pre surgical  Reproductive/Obstetrics                            Anesthesia Physical Anesthesia Plan  ASA: II  Anesthesia Plan: MAC   Post-op Pain Management:    Induction: Intravenous  PONV Risk Score and Plan: 1 and Treatment may vary due to age or medical condition and Propofol infusion  Airway Management Planned: Nasal Cannula  Additional Equipment:   Intra-op Plan:   Post-operative Plan:   Informed Consent: I have reviewed the patients History and Physical, chart, labs and discussed the procedure including the risks, benefits and alternatives for the proposed anesthesia with the patient or authorized representative who has indicated his/her understanding and acceptance.     Dental advisory given  Plan Discussed with: CRNA  Anesthesia Plan Comments:        Anesthesia Quick Evaluation

## 2020-02-01 NOTE — Transfer of Care (Signed)
Immediate Anesthesia Transfer of Care Note  Patient: [name] [facility]  Procedure(s) Performed: FLEXIBLE SIGMOIDOSCOPY (N/A )  Patient Location: PACU  Anesthesia Type:MAC  Level of Consciousness: awake, alert  and oriented  Airway & Oxygen Therapy: Patient Spontanous Breathing and Patient connected to face mask oxygen  Post-op Assessment: Report given to RN, Post -op Vital signs reviewed and stable and Patient moving all extremities X 4  Post vital signs: Reviewed and stable  Last Vitals:  Vitals Value Taken Time  BP    Temp    Pulse    Resp    SpO2      Last Pain:  Vitals:   02/01/20 1330  TempSrc: Oral  PainSc: 0-No pain         Complications: No apparent anesthesia complications

## 2020-02-01 NOTE — Op Note (Signed)
   Operative Note   Date: 02/01/2020  Procedure: flexible sigmoidoscopy  Pre-op diagnosis: subtotal colectomy and ileostomy for trauma  Post-op diagnosis: same  Indication and clinical history: The patient is a 59 y.o. year old male with an ileostomy after subtotal colectomy for trauma.     Surgeon: Jesusita Oka, MD  Anesthesia: MAC  Findings:  . Specimen: none . EBL: 0cc . Drains/Implants: none  Disposition: PACU - hemodynamically stable.  Description of procedure: The patient was positioned left lateral decubitus and MAC induction was performed, which was uneventful. Time-out was performed verifying correct patient, procedure, and signature of informed consent.   Visual inspection of the anus and digital rectal exam were performed and were unremarkable. A lubricated colonoscope was inserted into the anal canal and advanced to the proximal aspect of the colon, which was at 20cm.The colon was inspected circumferentially and no masses or lesions were identified. The mucosa of the rectum appeared atrophic and mildly hemorrhagic. The colonoscope was withdrawn from the anal canal and the patient was awakened from anesthesia. He was transported to the PACU in good condition. There were no complications.    Jesusita Oka, MD General and [facility]

## 2020-02-01 NOTE — Anesthesia Postprocedure Evaluation (Signed)
Anesthesia Post Note  Patient: Rodger Giangregorio [place]  Procedure(s) Performed: FLEXIBLE SIGMOIDOSCOPY (N/A )     Patient location during evaluation: Endoscopy Anesthesia Type: MAC Level of consciousness: awake and alert Pain management: pain level controlled Vital Signs Assessment: post-procedure vital signs reviewed and stable Respiratory status: spontaneous breathing, nonlabored ventilation, respiratory function stable and patient connected to nasal cannula oxygen Cardiovascular status: stable and blood pressure returned to baseline Postop Assessment: no apparent nausea or vomiting Anesthetic complications: no    Last Vitals:  Vitals:   02/01/20 1510 02/01/20 1517  BP: 105/86 117/79  Pulse: (!) 103 96  Resp: (!) 25 18  Temp:    SpO2: 99% 99%    Last Pain:  Vitals:   02/01/20 1517  TempSrc:   PainSc: 0-No pain                 Skylynne Schlechter P Imani Fiebelkorn

## 2020-02-01 NOTE — Anesthesia Procedure Notes (Addendum)
Procedure Name: MAC Date/Time: 02/01/2020 2:30 PM Performed by: Niel Hummer, CRNA Pre-anesthesia Checklist: Patient identified, Emergency Drugs available, Suction available and Patient being monitored Oxygen Delivery Method: Simple face mask

## 2020-02-02 ENCOUNTER — Encounter: Payer: Self-pay | Admitting: *Deleted

## 2020-02-04 ENCOUNTER — Telehealth: Payer: Self-pay | Admitting: Family

## 2020-02-04 NOTE — Telephone Encounter (Signed)
Patient aware of lab results and states that he has been having some sinus issues - cough, sneezing and nasal congestion.

## 2020-02-07 NOTE — Telephone Encounter (Signed)
Ok, let me know if these worsen or do not improve

## 2020-02-07 NOTE — Telephone Encounter (Signed)
Taking Zyrtec - helping - spoke with pt

## 2020-02-07 NOTE — Telephone Encounter (Signed)
Patient aware.

## 2020-02-15 ENCOUNTER — Ambulatory Visit (INDEPENDENT_AMBULATORY_CARE_PROVIDER_SITE_OTHER): Payer: 59 | Admitting: Family

## 2020-02-15 ENCOUNTER — Encounter: Payer: Self-pay | Admitting: Family

## 2020-02-15 ENCOUNTER — Other Ambulatory Visit: Payer: Self-pay

## 2020-02-15 VITALS — BP 99/68 | HR 108 | Temp 97.2°F | Ht 70.0 in | Wt 139.2 lb

## 2020-02-15 DIAGNOSIS — I959 Hypotension, unspecified: Secondary | ICD-10-CM | POA: Diagnosis not present

## 2020-02-15 DIAGNOSIS — D72829 Elevated white blood cell count, unspecified: Secondary | ICD-10-CM | POA: Diagnosis not present

## 2020-02-15 DIAGNOSIS — I10 Essential (primary) hypertension: Secondary | ICD-10-CM

## 2020-02-15 MED ORDER — AMLODIPINE BESYLATE 5 MG PO TABS: 5.0000 mg | ORAL_TABLET | Freq: Every day | ORAL | 3 refills | Status: DC

## 2020-02-15 NOTE — Patient Instructions (Signed)

## 2020-02-15 NOTE — Progress Notes (Signed)
Subjective:    Patient ID: [name], male    DOB: November 30, 1960, 59 y.o.   MRN: [id number]  Chief Complaint  Patient presents with  . Hypertension    2 week follow up   PT presents to the office today to recheck BP after changing his  Medications. We changed his propranolol to amlodipine-Benazepril 5-20 mg.   Hypertension This is a chronic problem. The current episode started more than 1 year ago. The problem has been resolved since onset. The problem is controlled. Pertinent negatives include no malaise/fatigue, peripheral edema or shortness of breath. Risk factors for coronary artery disease include male gender. Past treatments include ACE inhibitors and calcium channel blockers. The current treatment provides mild improvement. There is no history of kidney disease or heart failure.      Review of Systems  Constitutional: Negative for malaise/fatigue.  Respiratory: Negative for shortness of breath.   All other systems reviewed and are negative.      Objective:   Physical Exam Vitals reviewed.  Constitutional:      General: He is not in acute distress.    Appearance: He is well-developed.  HENT:     Head: Normocephalic.     Right Ear: Tympanic membrane normal.     Left Ear: Tympanic membrane normal.  Eyes:     General:        Right eye: No discharge.        Left eye: No discharge.     Pupils: Pupils are equal, round, and reactive to light.  Neck:     Thyroid: No thyromegaly.  Cardiovascular:     Rate and Rhythm: Normal rate and regular rhythm.     Heart sounds: Normal heart sounds. No murmur.  Pulmonary:     Effort: Pulmonary effort is normal. No respiratory distress.     Breath sounds: Normal breath sounds. No wheezing.  Abdominal:     General: Bowel sounds are normal. There is no distension.     Palpations: Abdomen is soft.     Tenderness: There is no abdominal tenderness.  Musculoskeletal:        General: No tenderness. Normal range of motion.     Cervical  back: Normal range of motion and neck supple.  Skin:    General: Skin is warm and dry.     Findings: No erythema or rash.  Neurological:     Mental Status: He is alert and oriented to person, place, and time.     Cranial Nerves: No cranial nerve deficit.     Deep Tendon Reflexes: Reflexes are normal and symmetric.  Psychiatric:        Behavior: Behavior normal.        Thought Content: Thought content normal.        Judgment: Judgment normal.       BP 101/69   Pulse (!) 108   Temp (!) 97.2 F (36.2 C) (Temporal)   Ht '5\' 10"'  (1.778 m)   Wt 139 lb 3.2 oz (63.1 kg)   SpO2 100%   BMI 19.97 kg/m      Assessment & Plan:  [name] comes in today with chief complaint of Hypertension (2 week follow up)   Diagnosis and orders addressed:  1. Essential hypertension - BMP8+EGFR - amLODipine (NORVASC) 5 MG tablet; Take 1 tablet (5 mg total) by mouth daily.  Dispense: 90 tablet; Refill: 3 - CBC with Differential/Platelet  2. Leukocytosis, unspecified type - CBC with Differential/Platelet  3. Hypotension, unspecified hypotension type   Will stop amlodipine-benazepril 5-20 mg and start amlodipine 5 mg related to hypotension.  He will continue to monitor BP at home and if greater than 140/90 he will call. Stay hydrated Labs pending   RTO in 1 month to recheck   Evelina Dun, FNP

## 2020-02-16 LAB — CBC WITH DIFFERENTIAL/PLATELET
Basophils Absolute: 0 10*3/uL (ref 0.0–0.2)
Basos: 1 %
EOS (ABSOLUTE): 0.1 10*3/uL (ref 0.0–0.4)
Eos: 1 %
Hematocrit: 34.4 % — ABNORMAL LOW (ref 37.5–51.0)
Hemoglobin: 11.9 g/dL — ABNORMAL LOW (ref 13.0–17.7)
Immature Grans (Abs): 0 10*3/uL (ref 0.0–0.1)
Immature Granulocytes: 1 %
Lymphocytes Absolute: 1.9 10*3/uL (ref 0.7–3.1)
Lymphs: 32 %
MCH: 31.5 pg (ref 26.6–33.0)
MCHC: 34.6 g/dL (ref 31.5–35.7)
MCV: 91 fL (ref 79–97)
Monocytes Absolute: 0.6 10*3/uL (ref 0.1–0.9)
Monocytes: 10 %
Neutrophils Absolute: 3.3 10*3/uL (ref 1.4–7.0)
Neutrophils: 55 %
Platelets: 397 10*3/uL (ref 150–450)
RBC: 3.78 x10E6/uL — ABNORMAL LOW (ref 4.14–5.80)
RDW: 14 % (ref 11.6–15.4)
WBC: 6 10*3/uL (ref 3.4–10.8)

## 2020-02-16 LAB — BMP8+EGFR
BUN/Creatinine Ratio: 18 (ref 9–20)
BUN: 18 mg/dL (ref 6–24)
CO2: 20 mmol/L (ref 20–29)
Calcium: 9.8 mg/dL (ref 8.7–10.2)
Chloride: 101 mmol/L (ref 96–106)
Creatinine, Ser: 1 mg/dL (ref 0.76–1.27)
GFR calc Af Amer: 95 mL/min/{1.73_m2} (ref >59)
GFR calc non Af Amer: 82 mL/min/{1.73_m2} (ref >59)
Glucose: 92 mg/dL (ref 65–99)
Potassium: 4.6 mmol/L (ref 3.5–5.2)
Sodium: 135 mmol/L (ref 134–144)

## 2020-02-29 ENCOUNTER — Ambulatory Visit: Payer: Self-pay | Admitting: Surgery

## 2020-03-17 ENCOUNTER — Encounter: Payer: Self-pay | Admitting: Family

## 2020-03-17 ENCOUNTER — Other Ambulatory Visit: Payer: Self-pay

## 2020-03-17 ENCOUNTER — Ambulatory Visit (INDEPENDENT_AMBULATORY_CARE_PROVIDER_SITE_OTHER): Payer: 59 | Admitting: Family

## 2020-03-17 VITALS — BP 133/83 | HR 96 | Temp 98.5°F | Ht 70.0 in | Wt 150.4 lb

## 2020-03-17 DIAGNOSIS — I10 Essential (primary) hypertension: Secondary | ICD-10-CM | POA: Diagnosis not present

## 2020-03-17 NOTE — Patient Instructions (Signed)

## 2020-03-17 NOTE — Progress Notes (Signed)
   Subjective:    Patient ID: [name], male    DOB: 1960/12/28, 59 y.o.   MRN: [id number]  Chief Complaint  Patient presents with  . Hypertension   Pt presents to the office today to recheck HTN after stopping benazepril 20 mg. His BP is at goal! States he is doing well. Hypertension This is a chronic problem. The current episode started more than 1 year ago. The problem has been resolved since onset. The problem is controlled. Associated symptoms include malaise/fatigue. Pertinent negatives include no peripheral edema or shortness of breath. Risk factors for coronary artery disease include male gender. Past treatments include calcium channel blockers. The current treatment provides moderate improvement. Hypertensive end-organ damage includes CVA. There is no history of kidney disease, CAD/MI or heart failure.      Review of Systems  Constitutional: Positive for malaise/fatigue.  Respiratory: Negative for shortness of breath.   All other systems reviewed and are negative.      Objective:   Physical Exam Vitals reviewed.  Constitutional:      General: He is not in acute distress.    Appearance: He is well-developed.  HENT:     Head: Normocephalic.     Right Ear: Tympanic membrane normal.     Left Ear: Tympanic membrane normal.  Eyes:     General:        Right eye: No discharge.        Left eye: No discharge.     Pupils: Pupils are equal, round, and reactive to light.  Neck:     Thyroid: No thyromegaly.  Cardiovascular:     Rate and Rhythm: Normal rate and regular rhythm.     Heart sounds: Normal heart sounds. No murmur heard.   Pulmonary:     Effort: Pulmonary effort is normal. No respiratory distress.     Breath sounds: Normal breath sounds. No wheezing.  Abdominal:     General: Bowel sounds are normal. There is no distension.     Palpations: Abdomen is soft.     Tenderness: There is no abdominal tenderness.  Musculoskeletal:        General: No tenderness.  Normal range of motion.     Cervical back: Normal range of motion and neck supple.  Skin:    General: Skin is warm and dry.     Findings: No erythema or rash.  Neurological:     Mental Status: He is alert and oriented to person, place, and time.     Cranial Nerves: No cranial nerve deficit.     Deep Tendon Reflexes: Reflexes are normal and symmetric.  Psychiatric:        Behavior: Behavior normal.        Thought Content: Thought content normal.        Judgment: Judgment normal.       BP 133/83   Pulse 96   Temp 98.5 F (36.9 C) (Temporal)   Ht 5\' 10"  (1.778 m)   Wt 150 lb 6.4 oz (68.2 kg)   BMI 21.58 kg/m      Assessment & Plan:  ELIEL DUDDING comes in today with chief complaint of Hypertension   Diagnosis and orders addressed:  1. Essential hypertension Continue Norvasc 5 mg, at goal today! -Dash diet information given -Exercise encouraged - Stress Management  -Continue current meds -RTO in 6 months    Evelina Dun, FNP

## 2020-03-27 ENCOUNTER — Other Ambulatory Visit: Payer: Self-pay | Admitting: Family

## 2020-03-27 DIAGNOSIS — I10 Essential (primary) hypertension: Secondary | ICD-10-CM

## 2020-04-03 ENCOUNTER — Ambulatory Visit (INDEPENDENT_AMBULATORY_CARE_PROVIDER_SITE_OTHER): Payer: 59 | Admitting: Adult Health

## 2020-04-03 ENCOUNTER — Other Ambulatory Visit: Payer: Self-pay

## 2020-04-03 ENCOUNTER — Encounter: Payer: Self-pay | Admitting: Adult Health

## 2020-04-03 VITALS — BP 134/82 | HR 88 | Ht 70.0 in | Wt 152.0 lb

## 2020-04-03 DIAGNOSIS — E785 Hyperlipidemia, unspecified: Secondary | ICD-10-CM

## 2020-04-03 DIAGNOSIS — I69398 Other sequelae of cerebral infarction: Secondary | ICD-10-CM

## 2020-04-03 DIAGNOSIS — I10 Essential (primary) hypertension: Secondary | ICD-10-CM | POA: Diagnosis not present

## 2020-04-03 DIAGNOSIS — I6350 Cerebral infarction due to unspecified occlusion or stenosis of unspecified cerebral artery: Secondary | ICD-10-CM | POA: Diagnosis not present

## 2020-04-03 DIAGNOSIS — R252 Cramp and spasm: Secondary | ICD-10-CM

## 2020-04-03 MED ORDER — BACLOFEN 10 MG PO TABS
10.0000 mg | ORAL_TABLET | Freq: Every evening | ORAL | 3 refills | Status: DC | PRN
Start: 2020-04-03 — End: 2020-11-10

## 2020-04-03 MED ORDER — ASPIRIN EC 81 MG PO TBEC
81.0000 mg | DELAYED_RELEASE_TABLET | Freq: Every day | ORAL | 11 refills | Status: DC
Start: 2020-04-03 — End: 2020-09-29

## 2020-04-03 NOTE — Progress Notes (Signed)
I agree with the above plan 

## 2020-04-03 NOTE — Patient Instructions (Addendum)
Nighttime lower extremity pain and shoulder pain could be due to increased tone/spasticity post stroke and you may benefit from a muscle relaxant.  Recommend trialing baclofen 10 mg nightly as needed  You may trial use of omega-3 fish oil or co-Q10 for muscle and joint aches and pains -use of turmeric can potentially cause increased bleeding risk especially with current use of aspirin  Continue home exercises for ongoing improvement -keep up the good work!!  Due to easy bleeding concerns, recommend decreasing aspirin dosage to aspirin 81 mg daily  and continue atorvastatin 10 mg daily for secondary stroke prevention  Continue to follow up with PCP regarding cholesterol and blood pressure management   Ensure you establish care with eye doctor for further evaluation of worsening visual concerns  Maintain strict control of hypertension with blood pressure goal below 130/90 and cholesterol with LDL cholesterol (bad cholesterol) goal below 70 mg/dL. I also advised the patient to eat a healthy diet with plenty of whole grains, cereals, fruits and vegetables, exercise regularly and maintain ideal body weight.  Followup in the future with me in 6 months or call earlier if needed       Thank you for coming to see [place] at [facility]. I hope we have been able to provide you high quality care today.  You may receive a patient satisfaction survey over the next few weeks. We would appreciate your feedback and comments so that we may continue to improve ourselves and the health of our patients.     Baclofen tablets What is this medicine? BACLOFEN (BAK loe fen) helps relieve spasms and cramping of muscles. It may be used to treat symptoms of multiple sclerosis or spinal cord injury. This medicine may be used for other purposes; ask your health care provider or pharmacist if you have questions. COMMON BRAND NAME(S): ED Baclofen, Lioresal What should I tell my health care provider  before I take this medicine? They need to know if you have any of these conditions:  kidney disease  seizures  stroke  an unusual or allergic reaction to baclofen, other medicines, foods, dyes, or preservatives  pregnant or trying to get pregnant  breast-feeding How should I use this medicine? Take this medicine by mouth. Swallow it with a drink of water. Follow the directions on the prescription label. Do not take more medicine than you are told to take. Talk to your pediatrician regarding the use of this medicine in children. Special care may be needed. Overdosage: If you think you have taken too much of this medicine contact a poison control center or emergency room at once. NOTE: This medicine is only for you. Do not share this medicine with others. What if I miss a dose? If you miss a dose, take it as soon as you can. If it is almost time for your next dose, take only that dose. Do not take double or extra doses. What may interact with this medicine? Do not take this medication with any of the following medicines:  narcotic medicines for cough This medicine may also interact with the following medications:  alcohol  antihistamines for allergy, cough and cold  certain medicines for anxiety or sleep  certain medicines for depression like amitriptyline, fluoxetine, sertraline  certain medicines for seizures like phenobarbital, primidone  general anesthetics like halothane, isoflurane, methoxyflurane, propofol  local anesthetics like lidocaine, pramoxine, tetracaine  medicines that relax muscles for surgery  narcotic medicines for pain  phenothiazines like chlorpromazine, mesoridazine, prochlorperazine, thioridazine  This list may not describe all possible interactions. Give your health care provider a list of all the medicines, herbs, non-prescription drugs, or dietary supplements you use. Also tell them if you smoke, drink alcohol, or use illegal drugs. Some items may  interact with your medicine. What should I watch for while using this medicine? Tell your doctor or health care professional if your symptoms do not start to get better or if they get worse. Do not suddenly stop taking your medicine. If you do, you may develop a severe reaction. If your doctor wants you to stop the medicine, the dose will be slowly lowered over time to avoid any side effects. Follow the advice of your doctor. You may get drowsy or dizzy. Do not drive, use machinery, or do anything that needs mental alertness until you know how this medicine affects you. Do not stand or sit up quickly, especially if you are an older patient. This reduces the risk of dizzy or fainting spells. Alcohol may interfere with the effect of this medicine. Avoid alcoholic drinks. If you are taking another medicine that also causes drowsiness, you may have more side effects. Give your health care provider a list of all medicines you use. Your doctor will tell you how much medicine to take. Do not take more medicine than directed. Call emergency for help if you have problems breathing or unusual sleepiness. What side effects may I notice from receiving this medicine? Side effects that you should report to your doctor or health care professional as soon as possible:  allergic reactions like skin rash, itching or hives, swelling of the face, lips, or tongue  breathing problems  changes in emotions or moods  changes in vision  chest pain  fast, irregular heartbeat  feeling faint or lightheaded, falls  hallucinations  loss of balance or coordination  ringing of the ears  seizures  trouble passing urine or change in the amount of urine  trouble walking  unusually weak or tired Side effects that usually do not require medical attention (report to your doctor or health care professional if they continue or are bothersome):  changes in taste  confusion  constipation  diarrhea  dry  mouth  headache  muscle weakness  nausea, vomiting  trouble sleeping This list may not describe all possible side effects. Call your doctor for medical advice about side effects. You may report side effects to FDA at 1-800-FDA-1088. Where should I keep my medicine? Keep out of the reach of children. Store at room temperature between 15 and 30 degrees C (59 and 86 degrees F). Keep container tightly closed. Throw away any unused medicine after the expiration date. NOTE: This sheet is a summary. It may not cover all possible information. If you have questions about this medicine, talk to your doctor, pharmacist, or health care provider.  2020 Elsevier/Gold Standard (2017-07-05 09:56:42)
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
## 2020-04-05 ENCOUNTER — Ambulatory Visit: Payer: 59 | Admitting: Adult Health

## 2020-05-03 ENCOUNTER — Encounter (HOSPITAL_COMMUNITY): Payer: Self-pay

## 2020-05-03 NOTE — Progress Notes (Signed)
Message sent to pt in mychart of the Covid drive-thru location change.

## 2020-05-04 NOTE — Progress Notes (Signed)
[id number] [place], [place] [address] [place] [address] Phone: [phone] Fax: [phone]      Your procedure is scheduled on May 12, 2020.  Report to [facility] Main Entrance "A" at 12:00 PM, and check in at the Admitting office.  Call this number if you have problems the morning of surgery:  [phone]  Call [phone] if you have any questions prior to your surgery date Monday-Friday 8am-4pm    Remember:  Do not eat after midnight the night before your surgery  You may drink clear liquids until 11:00 AM the morning of your surgery.   Clear liquids allowed are: Water, Non-Citrus Juices (without pulp), Carbonated Beverages, Clear Tea, Black Coffee Only, and Gatorade  **Please complete your PRE-SURGERY ENSURE (2 bottles) that was provided before you the night before your surgery on August 5.  **Please complete your PRE-SURGERY ENSURE (1 bottle) that was provided before you by 11:00am the morning of surgery.  Please, if able, drink it in one setting. DO NOT SIP.**    Take these medicines the morning of surgery with A SIP OF WATER : Amlodipine (Norvasc) Atorvastatin (Lipitor)  Follow your doctor's instructions on when to stop Aspirin.  If no instructions were given by your doctor then you will need to call the office to get those instructions.    As of today, STOP taking any Aleve, Naproxen, Ibuprofen, Motrin, Advil, Goody's, BC's, all herbal medications, fish oil, and all vitamins.                      Do not wear jewelry.            Do not wear lotions, powders, perfumes/colognes, or deodorant.            Do not shave 48 hours prior to surgery.  Men may shave face and neck.            Do not bring valuables to the hospital.            [facility] is not responsible for any belongings or valuables.  Do NOT Smoke (Tobacco/Vaping) or drink Alcohol 24 hours prior to your procedure If you use a CPAP at  night, you may bring all equipment for your overnight stay.   Contacts, glasses, dentures or bridgework may not be worn into surgery.      For patients admitted to the hospital, discharge time will be determined by your treatment team.   Patients discharged the day of surgery will not be allowed to drive home, and someone needs to stay with them for 24 hours.    Special instructions:   [place]- Preparing For Surgery  Before surgery, you can play an important role. Because skin is not sterile, your skin needs to be as free of germs as possible. You can reduce the number of germs on your skin by washing with CHG (chlorahexidine gluconate) Soap before surgery.  CHG is an antiseptic cleaner which kills germs and bonds with the skin to continue killing germs even after washing.    Oral Hygiene is also important to reduce your risk of infection.  Remember - BRUSH YOUR TEETH THE MORNING OF SURGERY WITH YOUR REGULAR TOOTHPASTE  Please do not use if you have an allergy to CHG or antibacterial soaps. If your skin becomes reddened/irritated stop using the CHG.  Do not shave (including legs and underarms) for  at least 48 hours prior to first CHG shower. It is OK to shave your face.  Please follow these instructions carefully.   1. Shower the NIGHT BEFORE SURGERY and the MORNING OF SURGERY with CHG Soap.   2. If you chose to wash your hair, wash your hair first as usual with your normal shampoo.  3. After you shampoo, rinse your hair and body thoroughly to remove the shampoo.  4. Use CHG as you would any other liquid soap. You can apply CHG directly to the skin and wash gently with a scrungie or a clean washcloth.   5. Apply the CHG Soap to your body ONLY FROM THE NECK DOWN.  Do not use on open wounds or open sores. Avoid contact with your eyes, ears, mouth and genitals (private parts). Wash Face and genitals (private parts)  with your normal soap.   6. Wash thoroughly, paying special  attention to the area where your surgery will be performed.  7. Thoroughly rinse your body with warm water from the neck down.  8. DO NOT shower/wash with your normal soap after using and rinsing off the CHG Soap.  9. Pat yourself dry with a CLEAN TOWEL.  10. Wear CLEAN PAJAMAS to bed the night before surgery  11. Place CLEAN SHEETS on your bed the night of your first shower and DO NOT SLEEP WITH PETS.   Day of Surgery: Wear Clean/Comfortable clothing the morning of surgery Do not apply any deodorants/lotions.   Remember to brush your teeth WITH YOUR REGULAR TOOTHPASTE.   Please read over the following fact sheets that you were given.

## 2020-05-05 ENCOUNTER — Other Ambulatory Visit: Payer: Self-pay

## 2020-05-05 ENCOUNTER — Encounter (HOSPITAL_COMMUNITY): Payer: Self-pay

## 2020-05-05 ENCOUNTER — Encounter (HOSPITAL_COMMUNITY)
Admission: RE | Admit: 2020-05-05 | Discharge: 2020-05-05 | Disposition: A | Discharge status: Home / Self Care | Payer: 59 | Source: Ambulatory Visit | Attending: Surgery | Admitting: Surgery

## 2020-05-05 DIAGNOSIS — Z01812 Encounter for preprocedural laboratory examination: Secondary | ICD-10-CM | POA: Insufficient documentation

## 2020-05-05 LAB — CBC
HCT: 39.8 % (ref 39.0–52.0)
Hemoglobin: 13.1 g/dL (ref 13.0–17.0)
MCH: 32.2 pg (ref 26.0–34.0)
MCHC: 32.9 g/dL (ref 30.0–36.0)
MCV: 97.8 fL (ref 80.0–100.0)
Platelets: 311 10*3/uL (ref 150–400)
RBC: 4.07 MIL/uL — ABNORMAL LOW (ref 4.22–5.81)
RDW: 13.1 % (ref 11.5–15.5)
WBC: 6.8 10*3/uL (ref 4.0–10.5)
nRBC: 0 % (ref 0.0–0.2)

## 2020-05-05 LAB — HEMOGLOBIN A1C
Hgb A1c MFr Bld: 5.4 % (ref 4.8–5.6)
Mean Plasma Glucose: 108.28 mg/dL

## 2020-05-05 LAB — TYPE AND SCREEN
ABO/RH(D): A POS
Antibody Screen: NEGATIVE

## 2020-05-05 LAB — BASIC METABOLIC PANEL
Anion gap: 7 (ref 5–15)
BUN: 13 mg/dL (ref 6–20)
CO2: 25 mmol/L (ref 22–32)
Calcium: 9.7 mg/dL (ref 8.9–10.3)
Chloride: 106 mmol/L (ref 98–111)
Creatinine, Ser: 1.11 mg/dL (ref 0.61–1.24)
GFR calc Af Amer: >60 mL/min (ref >60)
GFR calc non Af Amer: >60 mL/min (ref >60)
Glucose, Bld: 107 mg/dL — ABNORMAL HIGH (ref 70–99)
Potassium: 4.1 mmol/L (ref 3.5–5.1)
Sodium: 138 mmol/L (ref 135–145)

## 2020-05-05 NOTE — Progress Notes (Signed)
PCP: Evelina Dun FNP Cardiologist: Denies  EKG: 08-13-2019 CXR: denies ECHO: 08-07-2019 Stress Test: denies Cardiac Cath: denies  ERAS: 3 Bottles of ENSURE provided. Reviewed bowel prep with pt (instructions provided from office)  Instructed by surgeon office to Stop ASA on Monday August 2  Instructions/Map to new Covid address provided  Patient denies shortness of breath, fever, cough, and chest pain at PAT appointment.  Patient verbalized understanding of instructions provided today at the PAT appointment.  Patient asked to review instructions at home and day of surgery.

## 2020-05-08 IMAGING — CT CT ABD-PELV W/O CM
2 of 4 series · 14 of 46 positions shown, 16 images · non-contrast
Comparison: 08/09/2019

CLINICAL DATA: Abdominal pain, fever, abscess suspected.

EXAM:
CT ABDOMEN AND PELVIS WITHOUT CONTRAST
TECHNIQUE: Multidetector CT imaging of the abdomen and pelvis was performed
following the standard protocol without IV contrast.

[Series 3: ap without · axial · non-contrast · 0.77mm/px · z∈[-435,-5]mm · 11 of 98 slices shown, 13 images]
[im 6/98  soft-tissue]
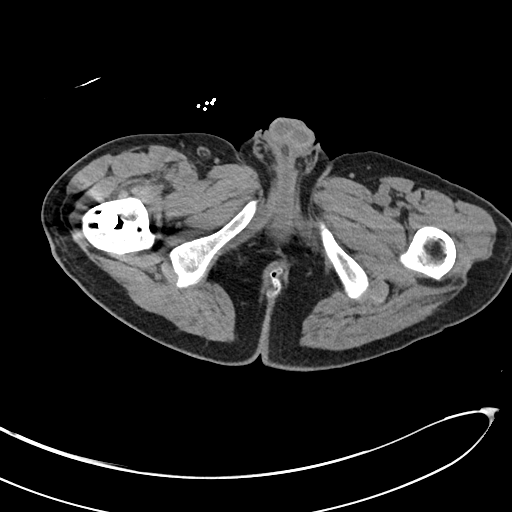
[im 6/98  bone]
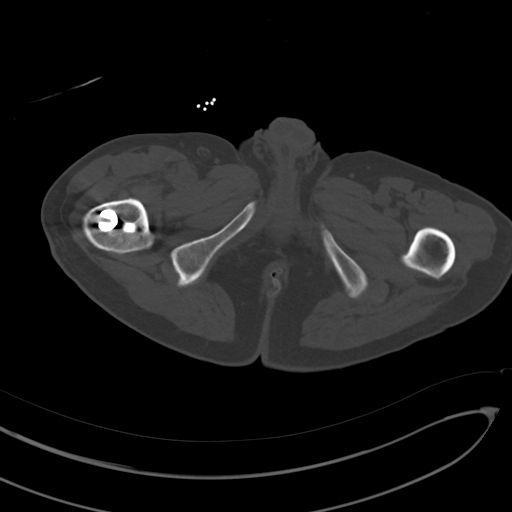
[im 16/98  soft-tissue]
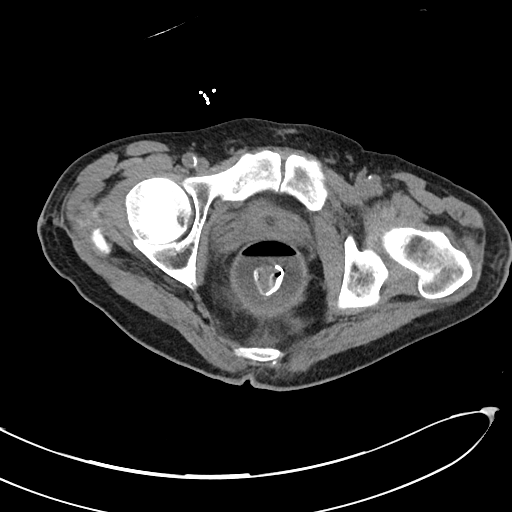
[im 26/98  soft-tissue]
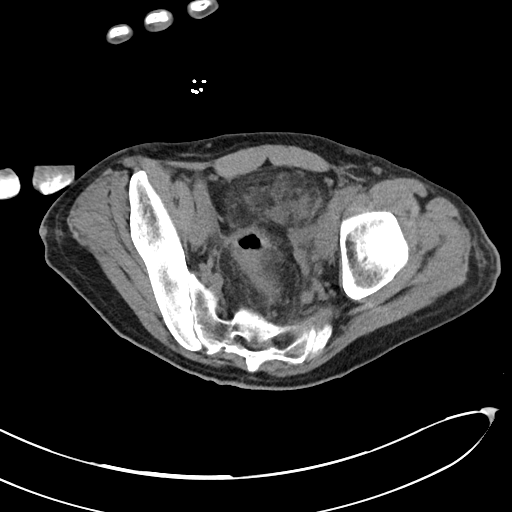
[im 31/98  soft-tissue]
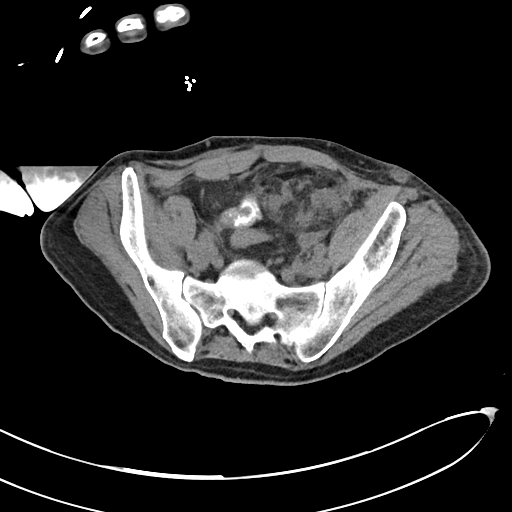
[im 41/98  soft-tissue]
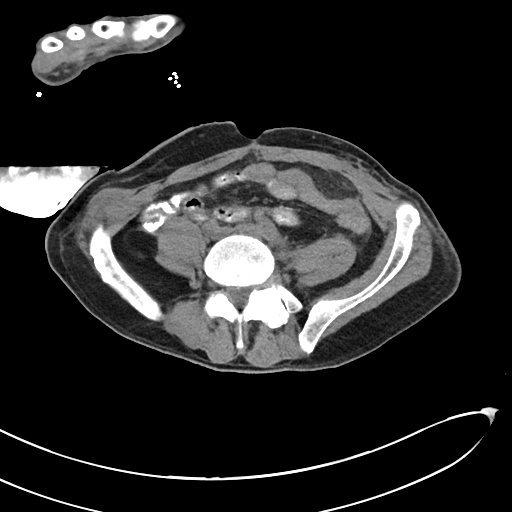
[im 52/98  soft-tissue]
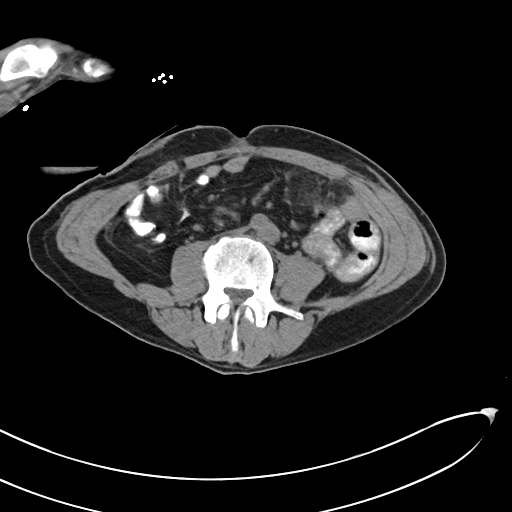
[im 57/98  soft-tissue]
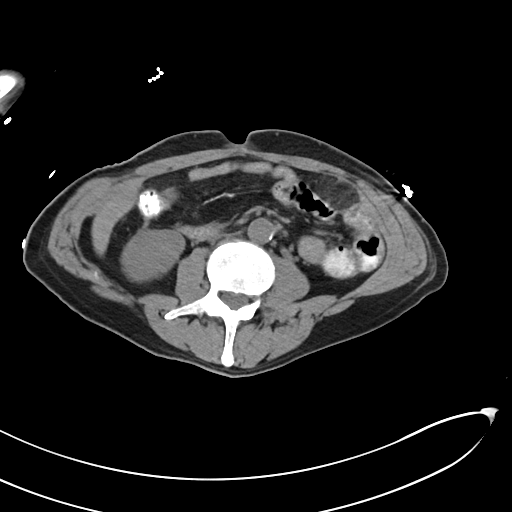
[im 67/98  soft-tissue]
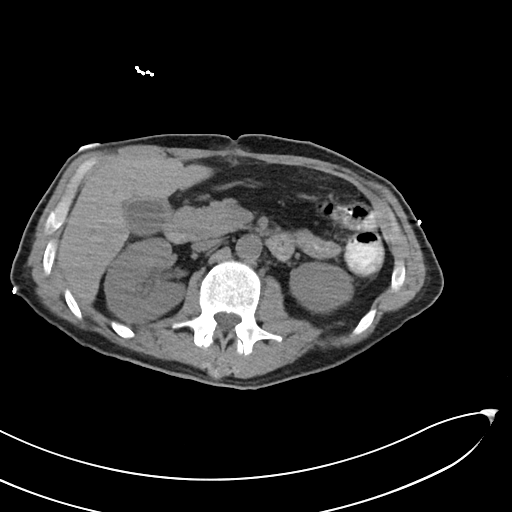
[im 72/98  soft-tissue]
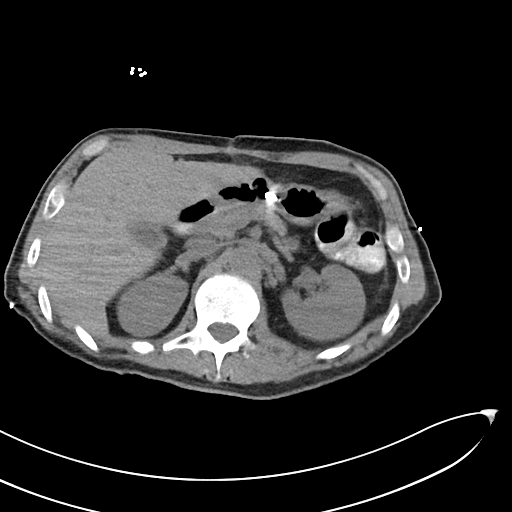
[im 72/98  bone]
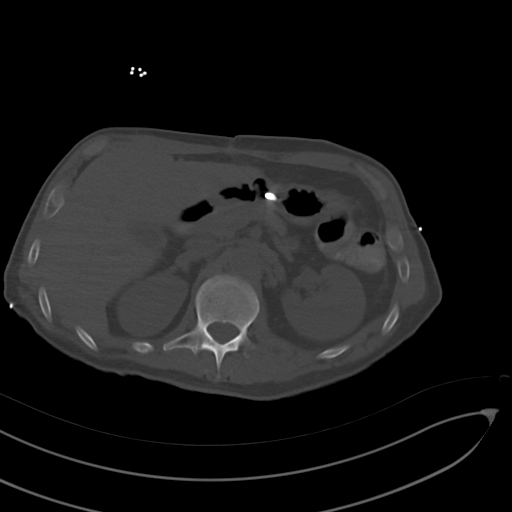
[im 82/98  soft-tissue]
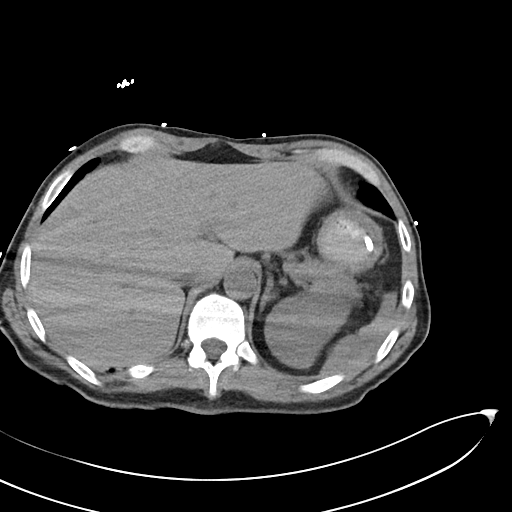
[im 92/98  soft-tissue]
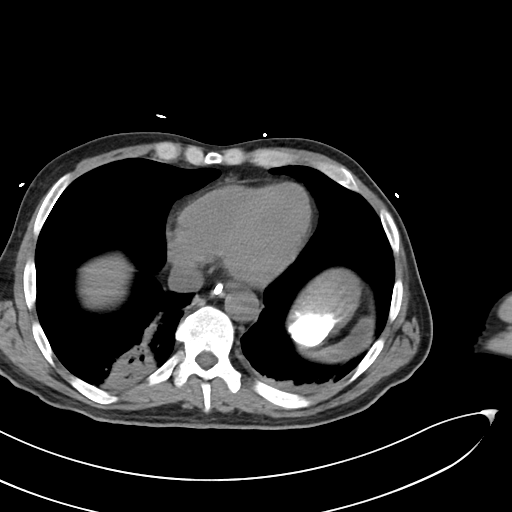

[Series 6: cor · coronal · 0.67mm/px · 3 of 83 slices shown]
[im 28/83  soft-tissue]
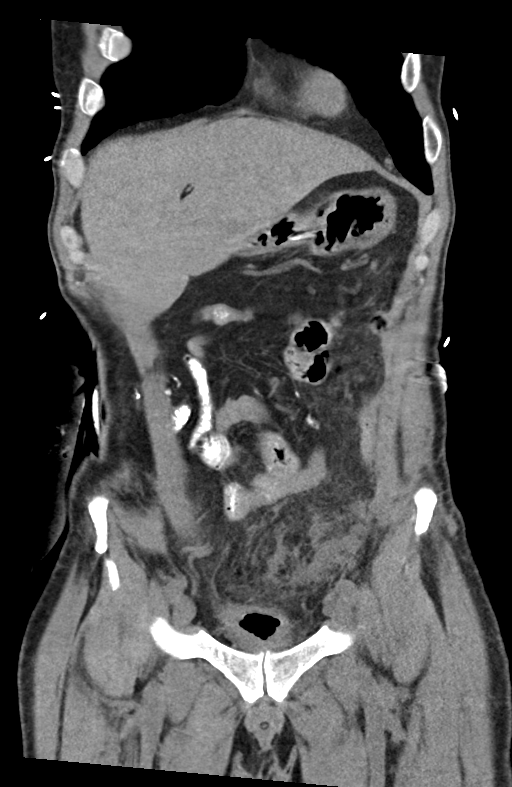
[im 37/83  soft-tissue]
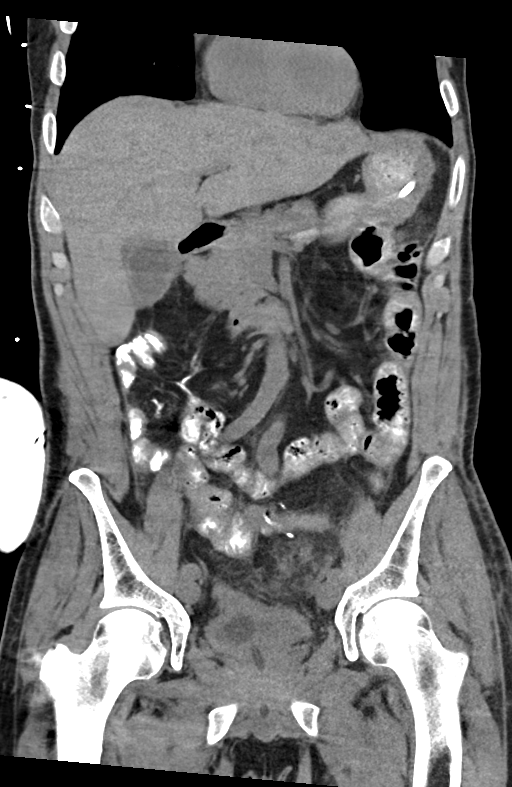
[im 46/83  soft-tissue]
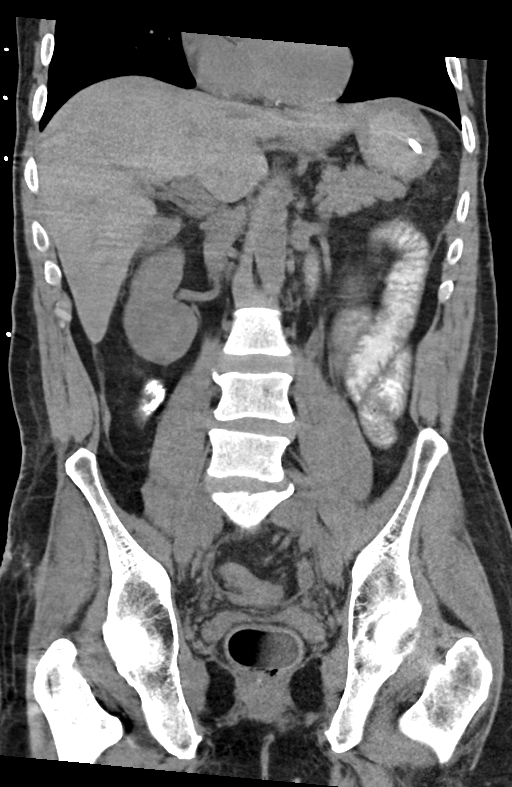

[14 of 46 positions shown; findings below may reference images not displayed]

FINDINGS: Lower chest: Basilar consolidation persists with perhaps slight
improved aeration of the right lung base. No signs of pleural
effusion. No pericardial fluid.

Hepatobiliary: No signs of focal, suspicious hepatic lesion on
noncontrast scan. Stable mild wall thickening of the gallbladder is
suggested with small amount of fluid adjacent to gallbladder as
before. This is nearly completely resolved since the prior exam as
has generalized fluid throughout the peritoneum.

Pancreas: Unremarkable. No pancreatic ductal dilatation or
surrounding inflammatory changes.

Spleen: Normal in size without focal abnormality.

Adrenals/Urinary Tract: Adrenal glands are normal. No signs of
hydronephrosis. Bilateral nonobstructing intrarenal calculi show no
change. Foley catheter remains in place.

Stomach/Bowel: Weighted tip feeding tube in the stomach. Positive
enteric contrast is been administered. Signs of colectomy and right
lower quadrant ileostomy as before. Rectal pouch terminates in the
mid to lower abdomen anterior to the iliac vasculature.

Pocket of fluid that was present adjacent to the rectal pouch on the
prior study has diminished considerably. There is increased
ill-defined fat stranding, potentially in the omentum overlying this
area since the prior study.

Fluid in the left lower quadrant 2 x 1.8 cm. Previously this area
measured 5.2 x 4.2 cm. Ovoid expansion of fat intermixed with fluid
anterior to this has increased but with no drainable or focal
collection.

Within mesenteric reflections there are areas of added density that
were not present on the previous study. Exact site for presumed
contrast leak cannot be determined but small leak is likely present
given appearance. No signs of free air.

Rectal management tube in place.

Vascular/Lymphatic: Scattered atherosclerosis. No signs of aneurysm.
No signs of adenopathy.

Reproductive: Prostate is unremarkable. Foley catheter in the
urinary bladder.

Other: Postoperative changes in the midline similar to prior study.
Right lower quadrant ileostomy. Bowel contents and enteric contrast
fill much of the ileostomy bag.

Musculoskeletal: No signs of acute bone finding or destructive bone
process. Post right femoral nailing.
IMPRESSION: 1. Tiny bowel leak is considered based on presence of dense
material/contrast layering in mesenteric reflections. There is no
pneumoperitoneum.
2. Increased omental stranding and potential developing fat necrosis
with persistent mesenteric edema and bowel edema.
3. Diminished fluid adjacent to the nondistended gallbladder with
mild nonspecific gallbladder wall thickening, attention on follow-up
and correlation with laboratory values is suggested.
4. Diminished consolidation at the lung bases may reflect improving
pneumonia.
5. These results were called by telephone at the time of
interpretation on 08/22/2019 at [DATE] to provider Dr. Kye Soo,
Who verbally acknowledged these results. Items 1 and 2 above were
discussed with Dr. Kye Soo.

## 2020-05-09 ENCOUNTER — Other Ambulatory Visit (HOSPITAL_COMMUNITY)
Admission: RE | Admit: 2020-05-09 | Discharge: 2020-05-09 | Disposition: A | Discharge status: Home / Self Care | Payer: 59 | Source: Ambulatory Visit | Attending: Surgery | Admitting: Surgery

## 2020-05-09 DIAGNOSIS — Z20822 Contact with and (suspected) exposure to covid-19: Secondary | ICD-10-CM | POA: Insufficient documentation

## 2020-05-09 DIAGNOSIS — Z01812 Encounter for preprocedural laboratory examination: Secondary | ICD-10-CM | POA: Diagnosis present

## 2020-05-09 LAB — SARS CORONAVIRUS 2 (TAT 6-24 HRS): SARS Coronavirus 2: NEGATIVE
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
## 2020-05-12 ENCOUNTER — Inpatient Hospital Stay (HOSPITAL_COMMUNITY): Admission: RE | Admit: 2020-05-12 | Payer: 59 | Source: Home / Self Care | Admitting: Surgery

## 2020-05-12 ENCOUNTER — Encounter (HOSPITAL_COMMUNITY): Admission: RE | Payer: Self-pay | Source: Home / Self Care

## 2020-05-12 SURGERY — COLOSTOMY REVERSAL
Anesthesia: General

## 2020-05-15 ENCOUNTER — Telehealth (HOSPITAL_COMMUNITY): Payer: Self-pay

## 2020-05-19 ENCOUNTER — Other Ambulatory Visit (HOSPITAL_COMMUNITY)
Admission: RE | Admit: 2020-05-19 | Discharge: 2020-05-19 | Disposition: A | Discharge status: Home / Self Care | Payer: 59 | Source: Ambulatory Visit | Attending: Surgery | Admitting: Surgery

## 2020-05-19 DIAGNOSIS — Z20822 Contact with and (suspected) exposure to covid-19: Secondary | ICD-10-CM | POA: Insufficient documentation

## 2020-05-19 DIAGNOSIS — Z01812 Encounter for preprocedural laboratory examination: Secondary | ICD-10-CM | POA: Insufficient documentation

## 2020-05-19 LAB — SARS CORONAVIRUS 2 (TAT 6-24 HRS): SARS Coronavirus 2: NEGATIVE

## 2020-05-22 NOTE — Progress Notes (Signed)
I spoke with Mr Ruddick and confirmed arrival of 0530 in am.

## 2020-05-22 NOTE — Anesthesia Preprocedure Evaluation (Addendum)
Anesthesia Evaluation  Patient identified by MRN, date of birth, ID band Patient awake    Reviewed: Allergy & Precautions, NPO status , Patient's Chart, lab work & pertinent test results  Airway Mallampati: I  TM Distance: >3 FB Neck ROM: Full    Dental no notable dental hx.    Pulmonary neg pulmonary ROS,    Pulmonary exam normal breath sounds clear to auscultation       Cardiovascular hypertension, Pt. on medications Normal cardiovascular exam Rhythm:Regular Rate:Normal  ECG: ST, rate 116   Neuro/Psych PSYCHIATRIC DISORDERS Anxiety Depression Left hand weakness CVA, Residual Symptoms    GI/Hepatic negative GI ROS, Neg liver ROS,   Endo/Other  negative endocrine ROS  Renal/GU negative Renal ROS     Musculoskeletal negative musculoskeletal ROS (+)   Abdominal   Peds  Hematology HLD   Anesthesia Other Findings end ileostomy  Reproductive/Obstetrics                            Anesthesia Physical Anesthesia Plan  ASA: II  Anesthesia Plan: General   Post-op Pain Management:    Induction: Intravenous  PONV Risk Score and Plan: 2 and Ondansetron, Dexamethasone, Midazolam and Treatment may vary due to age or medical condition  Airway Management Planned: Oral ETT  Additional Equipment:   Intra-op Plan:   Post-operative Plan: Extubation in OR  Informed Consent: I have reviewed the patients History and Physical, chart, labs and discussed the procedure including the risks, benefits and alternatives for the proposed anesthesia with the patient or authorized representative who has indicated his/her understanding and acceptance.     Dental advisory given  Plan Discussed with: CRNA  Anesthesia Plan Comments:        Anesthesia Quick Evaluation

## 2020-05-23 ENCOUNTER — Encounter (HOSPITAL_COMMUNITY): Payer: Self-pay | Admitting: Surgery

## 2020-05-23 ENCOUNTER — Inpatient Hospital Stay (HOSPITAL_COMMUNITY): Payer: 59 | Admitting: Anesthesiology

## 2020-05-23 ENCOUNTER — Other Ambulatory Visit: Payer: Self-pay

## 2020-05-23 ENCOUNTER — Encounter (HOSPITAL_COMMUNITY): Admission: RE | Disposition: A | Discharge status: Home / Self Care | Payer: Self-pay | Source: Home / Self Care

## 2020-05-23 ENCOUNTER — Inpatient Hospital Stay (HOSPITAL_COMMUNITY)
Admission: RE | Admit: 2020-05-23 | Discharge: 2020-05-26 | DRG: 330 | Disposition: A | Discharge status: Home / Self Care | Payer: 59 | Attending: General Surgery | Admitting: General Surgery

## 2020-05-23 DIAGNOSIS — D62 Acute posthemorrhagic anemia: Secondary | ICD-10-CM | POA: Diagnosis not present

## 2020-05-23 DIAGNOSIS — I10 Essential (primary) hypertension: Secondary | ICD-10-CM | POA: Diagnosis present

## 2020-05-23 DIAGNOSIS — Z432 Encounter for attention to ileostomy: Secondary | ICD-10-CM | POA: Diagnosis present

## 2020-05-23 DIAGNOSIS — Z8042 Family history of malignant neoplasm of prostate: Secondary | ICD-10-CM

## 2020-05-23 DIAGNOSIS — K921 Melena: Secondary | ICD-10-CM | POA: Diagnosis not present

## 2020-05-23 DIAGNOSIS — Z932 Ileostomy status: Secondary | ICD-10-CM

## 2020-05-23 DIAGNOSIS — Z8249 Family history of ischemic heart disease and other diseases of the circulatory system: Secondary | ICD-10-CM

## 2020-05-23 DIAGNOSIS — Z8673 Personal history of transient ischemic attack (TIA), and cerebral infarction without residual deficits: Secondary | ICD-10-CM

## 2020-05-23 DIAGNOSIS — Z87442 Personal history of urinary calculi: Secondary | ICD-10-CM | POA: Diagnosis not present

## 2020-05-23 DIAGNOSIS — Z9889 Other specified postprocedural states: Secondary | ICD-10-CM | POA: Diagnosis present

## 2020-05-23 HISTORY — PX: LAPAROTOMY: SHX154

## 2020-05-23 HISTORY — PX: COLOSTOMY REVERSAL: SHX5782

## 2020-05-23 LAB — CBC
HCT: 46.1 % (ref 39.0–52.0)
Hemoglobin: 15.2 g/dL (ref 13.0–17.0)
MCH: 31.1 pg (ref 26.0–34.0)
MCHC: 33 g/dL (ref 30.0–36.0)
MCV: 94.5 fL (ref 80.0–100.0)
Platelets: 340 10*3/uL (ref 150–400)
RBC: 4.88 MIL/uL (ref 4.22–5.81)
RDW: 12.6 % (ref 11.5–15.5)
WBC: 5.6 10*3/uL (ref 4.0–10.5)
nRBC: 0 % (ref 0.0–0.2)

## 2020-05-23 LAB — BASIC METABOLIC PANEL
Anion gap: 12 (ref 5–15)
BUN: 15 mg/dL (ref 6–20)
CO2: 24 mmol/L (ref 22–32)
Calcium: 10.1 mg/dL (ref 8.9–10.3)
Chloride: 99 mmol/L (ref 98–111)
Creatinine, Ser: 1.2 mg/dL (ref 0.61–1.24)
GFR calc Af Amer: >60 mL/min (ref >60)
GFR calc non Af Amer: >60 mL/min (ref >60)
Glucose, Bld: 111 mg/dL — ABNORMAL HIGH (ref 70–99)
Potassium: 3.7 mmol/L (ref 3.5–5.1)
Sodium: 135 mmol/L (ref 135–145)

## 2020-05-23 LAB — TYPE AND SCREEN
ABO/RH(D): A POS
Antibody Screen: NEGATIVE

## 2020-05-23 SURGERY — COLOSTOMY REVERSAL
Anesthesia: General | Site: Abdomen

## 2020-05-23 MED ORDER — PROPOFOL 10 MG/ML IV BOLUS
INTRAVENOUS | Status: AC
  Filled 2020-05-23: qty 40

## 2020-05-23 MED ORDER — ROCURONIUM BROMIDE 10 MG/ML (PF) SYRINGE
PREFILLED_SYRINGE | INTRAVENOUS | Status: AC
  Filled 2020-05-23: qty 20

## 2020-05-23 MED ORDER — FENTANYL CITRATE (PF) 250 MCG/5ML IJ SOLN
INTRAMUSCULAR | Status: AC
  Filled 2020-05-23: qty 5

## 2020-05-23 MED ORDER — HYDROMORPHONE HCL 1 MG/ML IJ SOLN
INTRAMUSCULAR | Status: AC
  Filled 2020-05-23: qty 1

## 2020-05-23 MED ORDER — FENTANYL CITRATE (PF) 100 MCG/2ML IJ SOLN
INTRAMUSCULAR | Status: AC
  Filled 2020-05-23: qty 2

## 2020-05-23 MED ORDER — KETOROLAC TROMETHAMINE 15 MG/ML IJ SOLN
15.0000 mg | Freq: Four times a day (QID) | INTRAMUSCULAR | Status: DC | PRN
  Administered 2020-05-23 – 2020-05-25 (×5): 15 mg via INTRAVENOUS
  Filled 2020-05-23 (×5): qty 1

## 2020-05-23 MED ORDER — CHLORHEXIDINE GLUCONATE 0.12 % MT SOLN
15.0000 mL | Freq: Once | OROMUCOSAL | Status: AC
  Administered 2020-05-23: 15 mL via OROMUCOSAL
  Filled 2020-05-23: qty 15

## 2020-05-23 MED ORDER — OXYCODONE HCL 5 MG PO TABS: 5.0000 mg | ORAL_TABLET | Freq: Once | ORAL | Status: DC | PRN

## 2020-05-23 MED ORDER — ALBUMIN HUMAN 5 % IV SOLN: INTRAVENOUS | Status: DC | PRN

## 2020-05-23 MED ORDER — SUGAMMADEX SODIUM 200 MG/2ML IV SOLN
INTRAVENOUS | Status: DC | PRN
  Administered 2020-05-23: 200 mg via INTRAVENOUS

## 2020-05-23 MED ORDER — OXYCODONE HCL 5 MG/5ML PO SOLN
5.0000 mg | ORAL | Status: DC | PRN
  Administered 2020-05-23: 10 mg via ORAL
  Filled 2020-05-23 (×3): qty 10

## 2020-05-23 MED ORDER — ENOXAPARIN SODIUM 40 MG/0.4ML ~~LOC~~ SOLN: 40.0000 mg | SUBCUTANEOUS | Status: DC

## 2020-05-23 MED ORDER — LACTATED RINGERS IV SOLN: INTRAVENOUS | Status: DC

## 2020-05-23 MED ORDER — PROPOFOL 10 MG/ML IV BOLUS
INTRAVENOUS | Status: DC | PRN
  Administered 2020-05-23: 160 mg via INTRAVENOUS

## 2020-05-23 MED ORDER — ALVIMOPAN 12 MG PO CAPS
12.0000 mg | ORAL_CAPSULE | ORAL | Status: AC
  Administered 2020-05-23: 12 mg via ORAL
  Filled 2020-05-23: qty 1

## 2020-05-23 MED ORDER — CHLORHEXIDINE GLUCONATE CLOTH 2 % EX PADS: 6.0000 | MEDICATED_PAD | Freq: Once | CUTANEOUS | Status: DC

## 2020-05-23 MED ORDER — LIDOCAINE 2% (20 MG/ML) 5 ML SYRINGE
INTRAMUSCULAR | Status: AC
  Filled 2020-05-23: qty 5

## 2020-05-23 MED ORDER — OXYCODONE HCL 5 MG/5ML PO SOLN: 5.0000 mg | Freq: Once | ORAL | Status: DC | PRN

## 2020-05-23 MED ORDER — HEMOSTATIC AGENTS (NO CHARGE) OPTIME
TOPICAL | Status: DC | PRN
  Administered 2020-05-23: 1 via TOPICAL

## 2020-05-23 MED ORDER — DEXAMETHASONE SODIUM PHOSPHATE 10 MG/ML IJ SOLN
INTRAMUSCULAR | Status: DC | PRN
  Administered 2020-05-23: 8 mg via INTRAVENOUS

## 2020-05-23 MED ORDER — ONDANSETRON 4 MG PO TBDP: 4.0000 mg | ORAL_TABLET | Freq: Four times a day (QID) | ORAL | Status: DC | PRN

## 2020-05-23 MED ORDER — PROMETHAZINE HCL 25 MG/ML IJ SOLN: 6.2500 mg | INTRAMUSCULAR | Status: DC | PRN

## 2020-05-23 MED ORDER — METHOCARBAMOL 1000 MG/10ML IJ SOLN
1000.0000 mg | Freq: Once | INTRAVENOUS | Status: AC
  Administered 2020-05-23: 1000 mg via INTRAVENOUS
  Filled 2020-05-23 (×2): qty 10

## 2020-05-23 MED ORDER — LIDOCAINE 2% (20 MG/ML) 5 ML SYRINGE
INTRAMUSCULAR | Status: DC | PRN
  Administered 2020-05-23: 50 mg via INTRAVENOUS

## 2020-05-23 MED ORDER — MIDAZOLAM HCL 2 MG/2ML IJ SOLN
INTRAMUSCULAR | Status: AC
  Filled 2020-05-23: qty 2

## 2020-05-23 MED ORDER — ORAL CARE MOUTH RINSE: 15.0000 mL | Freq: Once | OROMUCOSAL | Status: AC

## 2020-05-23 MED ORDER — MIDAZOLAM HCL 2 MG/2ML IJ SOLN
INTRAMUSCULAR | Status: DC | PRN
  Administered 2020-05-23: 2 mg via INTRAVENOUS

## 2020-05-23 MED ORDER — DOCUSATE SODIUM 100 MG PO CAPS
100.0000 mg | ORAL_CAPSULE | Freq: Two times a day (BID) | ORAL | Status: DC
  Filled 2020-05-23 (×2): qty 1

## 2020-05-23 MED ORDER — KETOROLAC TROMETHAMINE 30 MG/ML IJ SOLN
30.0000 mg | Freq: Once | INTRAMUSCULAR | Status: AC | PRN
  Administered 2020-05-23: 30 mg via INTRAVENOUS

## 2020-05-23 MED ORDER — ENOXAPARIN SODIUM 40 MG/0.4ML ~~LOC~~ SOLN
40.0000 mg | Freq: Once | SUBCUTANEOUS | Status: AC
  Administered 2020-05-23: 40 mg via SUBCUTANEOUS
  Filled 2020-05-23: qty 0.4

## 2020-05-23 MED ORDER — ACETAMINOPHEN 500 MG PO TABS
1000.0000 mg | ORAL_TABLET | Freq: Four times a day (QID) | ORAL | Status: DC
  Administered 2020-05-23 – 2020-05-26 (×9): 1000 mg via ORAL
  Filled 2020-05-23 (×10): qty 2

## 2020-05-23 MED ORDER — HYDROMORPHONE HCL 1 MG/ML IJ SOLN
0.2500 mg | INTRAMUSCULAR | Status: DC | PRN
  Administered 2020-05-23 (×2): 0.5 mg via INTRAVENOUS

## 2020-05-23 MED ORDER — KETOROLAC TROMETHAMINE 30 MG/ML IJ SOLN
INTRAMUSCULAR | Status: AC
  Filled 2020-05-23: qty 1

## 2020-05-23 MED ORDER — SODIUM CHLORIDE 0.9 % IV SOLN
2.0000 g | INTRAVENOUS | Status: AC
  Administered 2020-05-23: 2 g via INTRAVENOUS
  Filled 2020-05-23: qty 2

## 2020-05-23 MED ORDER — SODIUM CHLORIDE 0.9 % IV SOLN
2.0000 g | Freq: Once | INTRAVENOUS | Status: DC
  Filled 2020-05-23: qty 2

## 2020-05-23 MED ORDER — ONDANSETRON HCL 4 MG/2ML IJ SOLN
4.0000 mg | Freq: Four times a day (QID) | INTRAMUSCULAR | Status: DC | PRN
  Administered 2020-05-24 – 2020-05-25 (×2): 4 mg via INTRAVENOUS
  Filled 2020-05-23 (×2): qty 2

## 2020-05-23 MED ORDER — FENTANYL CITRATE (PF) 100 MCG/2ML IJ SOLN
25.0000 ug | INTRAMUSCULAR | Status: DC | PRN
  Administered 2020-05-23 (×3): 50 ug via INTRAVENOUS

## 2020-05-23 MED ORDER — ACETAMINOPHEN 500 MG PO TABS
1000.0000 mg | ORAL_TABLET | Freq: Once | ORAL | Status: AC
  Administered 2020-05-23: 1000 mg via ORAL
  Filled 2020-05-23: qty 2

## 2020-05-23 MED ORDER — ROCURONIUM BROMIDE 10 MG/ML (PF) SYRINGE
PREFILLED_SYRINGE | INTRAVENOUS | Status: DC | PRN
  Administered 2020-05-23: 50 mg via INTRAVENOUS
  Administered 2020-05-23: 20 mg via INTRAVENOUS
  Administered 2020-05-23: 30 mg via INTRAVENOUS
  Administered 2020-05-23: 10 mg via INTRAVENOUS
  Administered 2020-05-23 (×2): 20 mg via INTRAVENOUS

## 2020-05-23 MED ORDER — FENTANYL CITRATE (PF) 100 MCG/2ML IJ SOLN
INTRAMUSCULAR | Status: DC | PRN
  Administered 2020-05-23: 100 ug via INTRAVENOUS
  Administered 2020-05-23 (×8): 50 ug via INTRAVENOUS

## 2020-05-23 MED ORDER — ONDANSETRON HCL 4 MG/2ML IJ SOLN
INTRAMUSCULAR | Status: DC | PRN
  Administered 2020-05-23: 4 mg via INTRAVENOUS

## 2020-05-23 MED ORDER — PHENYLEPHRINE 40 MCG/ML (10ML) SYRINGE FOR IV PUSH (FOR BLOOD PRESSURE SUPPORT)
PREFILLED_SYRINGE | INTRAVENOUS | Status: DC | PRN
  Administered 2020-05-23 (×2): 80 ug via INTRAVENOUS
  Administered 2020-05-23: 40 ug via INTRAVENOUS
  Administered 2020-05-23: 120 ug via INTRAVENOUS
  Administered 2020-05-23: 80 ug via INTRAVENOUS

## 2020-05-23 MED ORDER — 0.9 % SODIUM CHLORIDE (POUR BTL) OPTIME
TOPICAL | Status: DC | PRN
  Administered 2020-05-23 (×2): 2000 mL

## 2020-05-23 MED ORDER — METHOCARBAMOL 500 MG PO TABS: 1000.0000 mg | ORAL_TABLET | Freq: Four times a day (QID) | ORAL | Status: DC | PRN

## 2020-05-23 SURGICAL SUPPLY — 64 items
AGENT HMST 10 BLLW SHRT CANN (HEMOSTASIS) ×2
APL PRP STRL LF DISP 70% ISPRP (MISCELLANEOUS) ×2
BIOPATCH RED 1 DISK 7.0 (GAUZE/BANDAGES/DRESSINGS) ×3 IMPLANT
BLADE CLIPPER SURG (BLADE) IMPLANT
CANISTER SUCT 3000ML PPV (MISCELLANEOUS) ×3 IMPLANT
CHLORAPREP W/TINT 26 (MISCELLANEOUS) ×3 IMPLANT
COVER MAYO STAND STRL (DRAPES) ×3 IMPLANT
COVER SURGICAL LIGHT HANDLE (MISCELLANEOUS) ×6 IMPLANT
DRAIN CHANNEL 19F RND (DRAIN) ×3 IMPLANT
DRAPE WARM FLUID 44X44 (DRAPES) ×3 IMPLANT
DRSG COVADERM PLUS 2X2 (GAUZE/BANDAGES/DRESSINGS) ×3 IMPLANT
DRSG OPSITE POSTOP 4X10 (GAUZE/BANDAGES/DRESSINGS) ×3 IMPLANT
DRSG OPSITE POSTOP 4X8 (GAUZE/BANDAGES/DRESSINGS) IMPLANT
ELECT BLADE 6.5 EXT (BLADE) IMPLANT
ELECT CAUTERY BLADE 6.4 (BLADE) ×3 IMPLANT
ELECT REM PT RETURN 9FT ADLT (ELECTROSURGICAL) ×3
ELECTRODE REM PT RTRN 9FT ADLT (ELECTROSURGICAL) ×2 IMPLANT
EVACUATOR SILICONE 100CC (DRAIN) ×3 IMPLANT
GAUZE PACKING IODOFORM 1/4X15 (PACKING) ×3 IMPLANT
GLOVE BIO SURGEON STRL SZ 6 (GLOVE) ×6 IMPLANT
GLOVE BIO SURGEON STRL SZ 6.5 (GLOVE) ×9 IMPLANT
GLOVE BIO SURGEON STRL SZ7.5 (GLOVE) ×6 IMPLANT
GLOVE BIOGEL PI IND STRL 6 (GLOVE) ×6 IMPLANT
GLOVE BIOGEL PI IND STRL 7.5 (GLOVE) ×2 IMPLANT
GLOVE BIOGEL PI INDICATOR 6 (GLOVE) ×3
GLOVE BIOGEL PI INDICATOR 7.5 (GLOVE) ×1
GLOVE INDICATOR 7.5 STRL GRN (GLOVE) ×3 IMPLANT
GOWN SPEC L3 XXLG W/TWL (GOWN DISPOSABLE) ×6 IMPLANT
GOWN STRL REUS W/ TWL LRG LVL3 (GOWN DISPOSABLE) ×8 IMPLANT
GOWN STRL REUS W/TWL LRG LVL3 (GOWN DISPOSABLE) ×12
HANDLE SUCTION POOLE (INSTRUMENTS) ×2 IMPLANT
HEMOSTAT HEMOBLAST BELLOWS (HEMOSTASIS) ×3 IMPLANT
KIT BASIN OR (CUSTOM PROCEDURE TRAY) ×3 IMPLANT
KIT TURNOVER KIT B (KITS) ×3 IMPLANT
LEGGING LITHOTOMY PAIR STRL (DRAPES) ×3 IMPLANT
LIGASURE IMPACT 36 18CM CVD LR (INSTRUMENTS) IMPLANT
NS IRRIG 1000ML POUR BTL (IV SOLUTION) ×6 IMPLANT
PACK GENERAL/GYN (CUSTOM PROCEDURE TRAY) ×3 IMPLANT
PACK UNIVERSAL I (CUSTOM PROCEDURE TRAY) ×3 IMPLANT
PAD ARMBOARD 7.5X6 YLW CONV (MISCELLANEOUS) ×3 IMPLANT
PENCIL SMOKE EVACUATOR (MISCELLANEOUS) ×3 IMPLANT
RELOAD PROXIMATE 75MM BLUE (ENDOMECHANICALS) ×3 IMPLANT
SPONGE LAP 18X18 RF (DISPOSABLE) IMPLANT
STAPLER PROXIMATE 75MM BLUE (STAPLE) ×3 IMPLANT
STAPLER VISISTAT 35W (STAPLE) ×3 IMPLANT
SUCTION POOLE HANDLE (INSTRUMENTS) ×3
SURGILUBE 2OZ TUBE FLIPTOP (MISCELLANEOUS) ×3 IMPLANT
SUT CHROMIC 2 0 SH (SUTURE) ×3 IMPLANT
SUT ETHILON 2 0 FS 18 (SUTURE) ×3 IMPLANT
SUT PDS AB 1 TP1 54 (SUTURE) ×3 IMPLANT
SUT PDS AB 1 TP1 96 (SUTURE) ×6 IMPLANT
SUT SILK 0 TIES 10X30 (SUTURE) ×3 IMPLANT
SUT SILK 2 0 SH CR/8 (SUTURE) ×3 IMPLANT
SUT SILK 2 0 TIES 10X30 (SUTURE) ×3 IMPLANT
SUT SILK 3 0 SH CR/8 (SUTURE) ×6 IMPLANT
SUT SILK 3 0 TIES 10X30 (SUTURE) ×3 IMPLANT
SUT VIC AB 3-0 SH 18 (SUTURE) IMPLANT
SUT VIC AB 3-0 SH 27 (SUTURE) ×3
SUT VIC AB 3-0 SH 27XBRD (SUTURE) ×2 IMPLANT
SUT VIC AB 3-0 SH 8-18 (SUTURE) ×6 IMPLANT
TOWEL GREEN STERILE (TOWEL DISPOSABLE) ×3 IMPLANT
TRAY FOLEY MTR SLVR 16FR STAT (SET/KITS/TRAYS/PACK) ×3 IMPLANT
TUBE CONNECTING 12X1/4 (SUCTIONS) ×3 IMPLANT
YANKAUER SUCT BULB TIP NO VENT (SUCTIONS) ×3 IMPLANT
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
## 2020-05-23 NOTE — Anesthesia Postprocedure Evaluation (Signed)
Anesthesia Post Note  Patient: [facility]  Procedure(s) Performed: END ILEOSTOMY REVERSAL (N/A Abdomen) EXPLORATORY LAPAROTOMY (N/A Abdomen)     Patient location during evaluation: PACU Anesthesia Type: General Level of consciousness: awake and alert Pain management: pain level controlled Vital Signs Assessment: post-procedure vital signs reviewed and stable Respiratory status: spontaneous breathing, nonlabored ventilation, respiratory function stable and patient connected to nasal cannula oxygen Cardiovascular status: blood pressure returned to baseline and stable Postop Assessment: no apparent nausea or vomiting Anesthetic complications: no   No complications documented.  Last Vitals:  Vitals:   05/23/20 1420 05/23/20 1448  BP: 110/69 128/83  Pulse: (!) 103 (!) 108  Resp: 11 16  Temp:  36.6 C  SpO2: 98% 100%    Last Pain:  Vitals:   05/23/20 1448  TempSrc: Oral  PainSc:                  Ardian Haberland P Jenice Leiner

## 2020-05-23 NOTE — Progress Notes (Signed)
Dr Grandville Silos made aware that patient has had 2 bloody bowel movements after surgery. Patient is currently stable. Hgb 15.2. Patient denies any dizziness at this time
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
## 2020-05-23 NOTE — Transfer of Care (Signed)
Immediate Anesthesia Transfer of Care Note  Patient: [name] [facility]  Procedure(s) Performed: END ILEOSTOMY REVERSAL (N/A Abdomen) EXPLORATORY LAPAROTOMY (N/A Abdomen)  Patient Location: PACU  Anesthesia Type:General  Level of Consciousness: drowsy  Airway & Oxygen Therapy: Patient Spontanous Breathing and Patient connected to nasal cannula oxygen  Post-op Assessment: Report given to RN and Post -op Vital signs reviewed and stable  Post vital signs: Reviewed and stable  Last Vitals:  Vitals Value Taken Time  BP 117/77 05/23/20 1205  Temp    Pulse 85 05/23/20 1206  Resp 12 05/23/20 1206  SpO2 100 % 05/23/20 1206  Vitals shown include unvalidated device data.  Last Pain:  Vitals:   05/23/20 1205  TempSrc:   PainSc: (P) Asleep      Patients Stated Pain Goal: 3 (14/64/31 4276)  Complications: No complications documented.

## 2020-05-24 ENCOUNTER — Encounter (HOSPITAL_COMMUNITY): Payer: Self-pay | Admitting: Surgery

## 2020-05-24 LAB — BASIC METABOLIC PANEL
Anion gap: 11 (ref 5–15)
BUN: 24 mg/dL — ABNORMAL HIGH (ref 6–20)
CO2: 23 mmol/L (ref 22–32)
Calcium: 9 mg/dL (ref 8.9–10.3)
Chloride: 99 mmol/L (ref 98–111)
Creatinine, Ser: 1.34 mg/dL — ABNORMAL HIGH (ref 0.61–1.24)
GFR calc Af Amer: >60 mL/min (ref >60)
GFR calc non Af Amer: 57 mL/min — ABNORMAL LOW (ref >60)
Glucose, Bld: 128 mg/dL — ABNORMAL HIGH (ref 70–99)
Potassium: 4.1 mmol/L (ref 3.5–5.1)
Sodium: 133 mmol/L — ABNORMAL LOW (ref 135–145)

## 2020-05-24 LAB — CBC
HCT: 35.2 % — ABNORMAL LOW (ref 39.0–52.0)
Hemoglobin: 11.7 g/dL — ABNORMAL LOW (ref 13.0–17.0)
MCH: 31.4 pg (ref 26.0–34.0)
MCHC: 33.2 g/dL (ref 30.0–36.0)
MCV: 94.4 fL (ref 80.0–100.0)
Platelets: 280 10*3/uL (ref 150–400)
RBC: 3.73 MIL/uL — ABNORMAL LOW (ref 4.22–5.81)
RDW: 12.7 % (ref 11.5–15.5)
WBC: 16.2 10*3/uL — ABNORMAL HIGH (ref 4.0–10.5)
nRBC: 0 % (ref 0.0–0.2)

## 2020-05-24 MED ORDER — LACTATED RINGERS IV BOLUS: 500.0000 mL | Freq: Once | INTRAVENOUS | Status: DC

## 2020-05-24 MED ORDER — METHOCARBAMOL 500 MG PO TABS
1000.0000 mg | ORAL_TABLET | Freq: Three times a day (TID) | ORAL | Status: DC
  Administered 2020-05-24 – 2020-05-26 (×7): 1000 mg via ORAL
  Filled 2020-05-24 (×7): qty 2

## 2020-05-24 MED ORDER — LACTATED RINGERS IV BOLUS
1000.0000 mL | Freq: Once | INTRAVENOUS | Status: AC
  Administered 2020-05-24: 1000 mL via INTRAVENOUS

## 2020-05-24 NOTE — TOC CAGE-AID Note (Signed)
Transition of Care (TOC) - CAGE-AID Screening   Patient Details  Name: [name] MRN: [id number] Date of Birth: 02/16/1961  Transition of Care (TOC) CM/SW Contact:      , LCSWA Phone Number: 05/24/2020, 2:54 PM   Clinical Narrative:  CSW met with pt at bedside. CSW introduced self and explained her role at the hospital.  PT denies alcohol use and substance use. Pt did not need any resources at this time.   CAGE-AID Screening:    Have You Ever Felt You Ought to Cut Down on Your Drinking or Drug Use?: No Have People Annoyed You By Critizing Your Drinking Or Drug Use?: No Have You Felt Bad Or Guilty About Your Drinking Or Drug Use?: No Have You Ever Had a Drink or Used Drugs First Thing In The Morning to Steady Your Nerves or to Get Rid of a Hangover?: No CAGE-AID Score: 0  Substance Abuse Education Offered: Yes     , LCSWA, LCASA Clinical Social Worker [phone]       
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
## 2020-05-24 NOTE — Progress Notes (Signed)
   05/23/20 2125  Assess: MEWS Score  Temp 99.1 F (37.3 C)  BP 120/87  Pulse Rate (!) 112  Resp 16  SpO2 98 %  O2 Device Room Air  Assess: MEWS Score  MEWS Temp 0  MEWS Systolic 0  MEWS Pulse 2  MEWS RR 0  MEWS LOC 0  MEWS Score 2  MEWS Score Color Yellow  Assess: if the MEWS score is Yellow or Red  Were vital signs taken at a resting state? Yes  Focused Assessment No change from prior assessment  Early Detection of Sepsis Score *See Row Information* Low  MEWS guidelines implemented *See Row Information* Yes  Treat  MEWS Interventions Other (Comment)  Take Vital Signs  Increase Vital Sign Frequency  Yellow: Q 2hr X 2 then Q 4hr X 2, if remains yellow, continue Q 4hrs  Escalate  MEWS: Escalate Yellow: discuss with charge nurse/RN and consider discussing with provider and RRT  Notify: Charge Nurse/RN  Name of Charge Nurse/RN Notified [facility]  Date Charge Nurse/RN Notified 05/23/20  Time Charge Nurse/RN Notified 2125  Notify: Provider  Provider Name/Title Dr [facility] Silos  Date Provider Notified 05/24/20  Time Provider Notified 2140  Notification Type Page  Notification Reason Other (Comment)  Response No new orders  Document  Patient Outcome Other (Comment)  Progress note created (see row info) Yes

## 2020-05-25 LAB — CBC
HCT: 27.2 % — ABNORMAL LOW (ref 39.0–52.0)
Hemoglobin: 9.2 g/dL — ABNORMAL LOW (ref 13.0–17.0)
MCH: 32.3 pg (ref 26.0–34.0)
MCHC: 33.8 g/dL (ref 30.0–36.0)
MCV: 95.4 fL (ref 80.0–100.0)
Platelets: 231 10*3/uL (ref 150–400)
RBC: 2.85 MIL/uL — ABNORMAL LOW (ref 4.22–5.81)
RDW: 12.9 % (ref 11.5–15.5)
WBC: 13.2 10*3/uL — ABNORMAL HIGH (ref 4.0–10.5)
nRBC: 0 % (ref 0.0–0.2)

## 2020-05-25 LAB — BASIC METABOLIC PANEL
Anion gap: 6 (ref 5–15)
BUN: 20 mg/dL (ref 6–20)
CO2: 28 mmol/L (ref 22–32)
Calcium: 8.5 mg/dL — ABNORMAL LOW (ref 8.9–10.3)
Chloride: 99 mmol/L (ref 98–111)
Creatinine, Ser: 1.28 mg/dL — ABNORMAL HIGH (ref 0.61–1.24)
GFR calc Af Amer: >60 mL/min (ref >60)
GFR calc non Af Amer: >60 mL/min (ref >60)
Glucose, Bld: 109 mg/dL — ABNORMAL HIGH (ref 70–99)
Potassium: 3.7 mmol/L (ref 3.5–5.1)
Sodium: 133 mmol/L — ABNORMAL LOW (ref 135–145)

## 2020-05-25 LAB — SURGICAL PATHOLOGY

## 2020-05-25 NOTE — Plan of Care (Signed)
  Problem: Education: Goal: Knowledge of General Education information will improve Description Including pain rating scale, medication(s)/side effects and non-pharmacologic comfort measures Outcome: Progressing   
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
## 2020-05-26 LAB — CBC
HCT: 26.4 % — ABNORMAL LOW (ref 39.0–52.0)
Hemoglobin: 8.7 g/dL — ABNORMAL LOW (ref 13.0–17.0)
MCH: 32 pg (ref 26.0–34.0)
MCHC: 33 g/dL (ref 30.0–36.0)
MCV: 97.1 fL (ref 80.0–100.0)
Platelets: 240 10*3/uL (ref 150–400)
RBC: 2.72 MIL/uL — ABNORMAL LOW (ref 4.22–5.81)
RDW: 13.1 % (ref 11.5–15.5)
WBC: 10.7 10*3/uL — ABNORMAL HIGH (ref 4.0–10.5)
nRBC: 0 % (ref 0.0–0.2)

## 2020-05-26 LAB — BASIC METABOLIC PANEL
Anion gap: 7 (ref 5–15)
BUN: 18 mg/dL (ref 6–20)
CO2: 26 mmol/L (ref 22–32)
Calcium: 8.8 mg/dL — ABNORMAL LOW (ref 8.9–10.3)
Chloride: 100 mmol/L (ref 98–111)
Creatinine, Ser: 1.15 mg/dL (ref 0.61–1.24)
GFR calc Af Amer: >60 mL/min (ref >60)
GFR calc non Af Amer: >60 mL/min (ref >60)
Glucose, Bld: 98 mg/dL (ref 70–99)
Potassium: 3.7 mmol/L (ref 3.5–5.1)
Sodium: 133 mmol/L — ABNORMAL LOW (ref 135–145)

## 2020-05-26 MED ORDER — OXYCODONE HCL 5 MG/5ML PO SOLN: 5.0000 mg | Freq: Four times a day (QID) | ORAL | 0 refills | Status: AC | PRN

## 2020-05-26 MED ORDER — METHOCARBAMOL 500 MG PO TABS: 1000.0000 mg | ORAL_TABLET | Freq: Three times a day (TID) | ORAL | 0 refills | Status: DC | PRN

## 2020-05-26 MED ORDER — ACETAMINOPHEN 500 MG PO TABS: 1000.0000 mg | ORAL_TABLET | Freq: Four times a day (QID) | ORAL | 0 refills | Status: DC | PRN

## 2020-05-26 NOTE — Plan of Care (Signed)
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
## 2020-05-26 NOTE — Progress Notes (Signed)
Patient was stable at discharge. We reviewed the discharge education - especially about wound care and JP drain care. Patient and wife verbalized understanding and had no further questions. Patient left with belongings in hand.

## 2020-05-26 NOTE — Discharge Instructions (Signed)
[facility] - DISCHARGE INSTRUCTIONS TO PATIENT  Return to work on:  07/05/20  Activity:  Driving - Do not drive while taking narcotic medication   Lifting - do not lift over 10-15 lbs (gallon of milk) for 4 weeks                       Practice your Covid-19 protection:  Wear a mask, social distance, and wash your hands frequently  Wound Care:  May remove the dressing over your staples to shower. Do not have to keep staples covered but may cover staples with dry gauze/tape to keep them from snagging on your clothing. Continue to pull the strip packing from your old ostomy site back a little bit (1-2 inches) daily and cover with dry gauze/tape. Always replace your dressings after a shower.   Diet:  As tolerated  Follow up appointment:  Call Dr. Craig Guess office [facility]) at [phone] for an appointment in 2 weeks for staple removal and 3-4 weeks with surgeon.  Medications and dosages:  Resume your home medications.  You have a prescription for:               You may also take Tylenol, ibuprofen, or Aleve for pain  Call Dr. Bobbye Morton or her office  [phone]) if you have:  Temperature greater than 100.4,  Persistent nausea and vomiting,  Severe uncontrolled pain,  Redness, tenderness, or signs of infection (pain, swelling, redness, odor or green/yellow discharge around the site),  Difficulty breathing, headache or visual disturbances,  Any other questions or concerns you may have after discharge.  In an emergency, call 911 or go to an Emergency Department at a nearby hospital.

## 2020-05-26 NOTE — Progress Notes (Signed)
Patient's IV pulled out, Advised  Patient that I need to start a new one for his IV fluids but he refused. He stated to wait until the doctor sees him this am as he will be discharged home today.

## 2020-06-20 ENCOUNTER — Other Ambulatory Visit: Payer: Self-pay | Admitting: Family

## 2020-06-20 DIAGNOSIS — I10 Essential (primary) hypertension: Secondary | ICD-10-CM

## 2020-07-15 ENCOUNTER — Other Ambulatory Visit: Payer: Self-pay | Admitting: Family

## 2020-07-15 DIAGNOSIS — I10 Essential (primary) hypertension: Secondary | ICD-10-CM

## 2020-07-19 ENCOUNTER — Encounter: Payer: 59 | Admitting: Physical Medicine and Rehabilitation

## 2020-07-21 ENCOUNTER — Ambulatory Visit: Payer: 59 | Admitting: Physical Medicine and Rehabilitation

## 2020-07-26 ENCOUNTER — Telehealth: Payer: Self-pay | Admitting: Adult Health

## 2020-07-26 NOTE — Telephone Encounter (Signed)
This would need to go to whoever was completing his disability

## 2020-07-26 NOTE — Telephone Encounter (Signed)
Phone rep worked Special educational needs teacher, at 1:06 Dr [employer] office left message stating  Dr Sandrea Matte would like to schedule a peer to peer with provider to discuss limitations or restrictions placed on pt while under the care of her.  This is re: a disability claim.  The office # to call is [phone] between the hours of 9:00-5:00pm

## 2020-07-26 NOTE — Telephone Encounter (Signed)
I called pt and LMVM for him that we have not done disability p/w for him,  Dr. Sandrea Matte is asking for a peer to peer review, so this will need to be with whom ever is doing disbility for him.  I left pt Dr. Sandrea Matte # for him to address.

## 2020-07-27 NOTE — Telephone Encounter (Signed)
I spoke to Dr. Sandrea Matte office and relayed that we have not filled out disability p/w on pt, he would not to speak to whomever has done this p/w for him.  Person I spoke to relayed will make note.  He was reaching out to all providers for pt asking there input on limitations and restrictions.  He will make note.

## 2020-08-04 ENCOUNTER — Encounter: Payer: 59 | Attending: Physical Medicine and Rehabilitation | Admitting: Physical Medicine and Rehabilitation

## 2020-08-04 ENCOUNTER — Encounter: Payer: Self-pay | Admitting: Physical Medicine and Rehabilitation

## 2020-08-04 ENCOUNTER — Other Ambulatory Visit: Payer: Self-pay

## 2020-08-04 VITALS — BP 132/91 | HR 88 | Temp 98.2°F | Ht 70.0 in | Wt 140.8 lb

## 2020-08-04 DIAGNOSIS — S72351A Displaced comminuted fracture of shaft of right femur, initial encounter for closed fracture: Secondary | ICD-10-CM

## 2020-08-04 DIAGNOSIS — R269 Unspecified abnormalities of gait and mobility: Secondary | ICD-10-CM | POA: Insufficient documentation

## 2020-08-04 DIAGNOSIS — Z87442 Personal history of urinary calculi: Secondary | ICD-10-CM | POA: Diagnosis not present

## 2020-08-04 DIAGNOSIS — R197 Diarrhea, unspecified: Secondary | ICD-10-CM | POA: Diagnosis not present

## 2020-08-04 DIAGNOSIS — I6330 Cerebral infarction due to thrombosis of unspecified cerebral artery: Secondary | ICD-10-CM

## 2020-08-04 DIAGNOSIS — G2581 Restless legs syndrome: Secondary | ICD-10-CM | POA: Insufficient documentation

## 2020-08-04 DIAGNOSIS — T07XXXA Unspecified multiple injuries, initial encounter: Secondary | ICD-10-CM | POA: Diagnosis not present

## 2020-08-04 DIAGNOSIS — N200 Calculus of kidney: Secondary | ICD-10-CM

## 2020-08-04 MED ORDER — ROPINIROLE HCL 0.25 MG PO TABS: 0.2500 mg | ORAL_TABLET | Freq: Every day | ORAL | 3 refills | Status: DC
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
## 2020-09-07 ENCOUNTER — Encounter: Payer: Self-pay | Admitting: *Deleted

## 2020-09-18 ENCOUNTER — Ambulatory Visit: Payer: 59 | Admitting: Family

## 2020-09-27 ENCOUNTER — Encounter: Payer: Self-pay | Admitting: Adult Health

## 2020-09-27 ENCOUNTER — Other Ambulatory Visit: Payer: Self-pay

## 2020-09-27 ENCOUNTER — Ambulatory Visit (INDEPENDENT_AMBULATORY_CARE_PROVIDER_SITE_OTHER): Payer: 59 | Admitting: Adult Health

## 2020-09-27 VITALS — BP 120/70 | HR 99 | Ht 70.0 in | Wt 140.0 lb

## 2020-09-27 DIAGNOSIS — I6350 Cerebral infarction due to unspecified occlusion or stenosis of unspecified cerebral artery: Secondary | ICD-10-CM | POA: Diagnosis not present

## 2020-09-27 DIAGNOSIS — E538 Deficiency of other specified B group vitamins: Secondary | ICD-10-CM

## 2020-09-27 DIAGNOSIS — Z932 Ileostomy status: Secondary | ICD-10-CM | POA: Diagnosis not present

## 2020-09-27 DIAGNOSIS — D649 Anemia, unspecified: Secondary | ICD-10-CM

## 2020-09-27 DIAGNOSIS — R5383 Other fatigue: Secondary | ICD-10-CM | POA: Diagnosis not present

## 2020-09-27 DIAGNOSIS — I10 Essential (primary) hypertension: Secondary | ICD-10-CM

## 2020-09-27 DIAGNOSIS — E785 Hyperlipidemia, unspecified: Secondary | ICD-10-CM

## 2020-09-27 MED ORDER — ROPINIROLE HCL 0.5 MG PO TABS: 0.5000 mg | ORAL_TABLET | Freq: Every day | ORAL | 1 refills | Status: DC

## 2020-09-27 NOTE — Patient Instructions (Addendum)
Recommend restarting Requip 0.25mg  nightly for 2 days and then increase to 0.5mg  nightly  We will check your B12 and thyroid function today - we will call you tomorrow with results  Referral placed to GI to follow up on ileostomy reversal and continued issues since reversal   Continue aspirin 81 mg daily  and atorvastatin  for secondary stroke prevention  Continue to follow up with PCP regarding cholesterol and blood pressure management  Maintain strict control of hypertension with blood pressure goal below 130/90 and cholesterol with LDL cholesterol (bad cholesterol) goal below 70 mg/dL.      Followup in the future with me in 4 months or call earlier if needed      Thank you for coming to see [place] at [facility]. I hope we have been able to provide you high quality care today.  You may receive a patient satisfaction survey over the next few weeks. We would appreciate your feedback and comments so that we may continue to improve ourselves and the health of our patients.    Restless Legs Syndrome Restless legs syndrome is a condition that causes uncomfortable feelings or sensations in the legs, especially while sitting or lying down. The sensations usually cause an overwhelming urge to move the legs. The arms can also sometimes be affected. The condition can range from mild to severe. The symptoms often interfere with a person's ability to sleep. What are the causes? The cause of this condition is not known. What increases the risk? The following factors may make you more likely to develop this condition:  Being older than 50.  Pregnancy.  Being a woman. In general, the condition is more common in women than in men.  A family history of the condition.  Having iron deficiency.  Overuse of caffeine, nicotine, or alcohol.  Certain medical conditions, such as kidney disease, Parkinson's disease, or nerve damage.  Certain medicines, such as those for high  blood pressure, nausea, colds, allergies, depression, and some heart conditions. What are the signs or symptoms? The main symptom of this condition is uncomfortable sensations in the legs, such as:  Pulling.  Tingling.  Prickling.  Throbbing.  Crawling.  Burning. Usually, the sensations:  Affect both sides of the body.  Are worse when you sit or lie down.  Are worse at night. These may wake you up or make it difficult to fall asleep.  Make you have a strong urge to move your legs.  Are temporarily relieved by moving your legs. The arms can also be affected, but this is rare. People who have this condition often have tiredness during the day because of their lack of sleep at night. How is this diagnosed? This condition may be diagnosed based on:  Your symptoms.  Blood tests. In some cases, you may be monitored in a sleep lab by a specialist (a sleep study). This can detect any disruptions in your sleep. How is this treated? This condition is treated by managing the symptoms. This may include:  Lifestyle changes, such as exercising, using relaxation techniques, and avoiding caffeine, alcohol, or tobacco.  Medicines. Anti-seizure medicines may be tried first. Follow these instructions at home:     General instructions  Take over-the-counter and prescription medicines only as told by your health care provider.  Use methods to help relieve the uncomfortable sensations, such as: ? Massaging your legs. ? Walking or stretching. ? Taking a cold or hot bath.  Keep all follow-up visits as told by  your health care provider. This is important. Lifestyle  Practice good sleep habits. For example, go to bed and get up at the same time every day. Most adults should get 7-9 hours of sleep each night.  Exercise regularly. Try to get at least 30 minutes of exercise most days of the week.  Practice ways of relaxing, such as yoga or meditation.  Avoid caffeine and  alcohol.  Do not use any products that contain nicotine or tobacco, such as cigarettes and e-cigarettes. If you need help quitting, ask your health care provider. Contact a health care provider if:  Your symptoms get worse or they do not improve with treatment. Summary  Restless legs syndrome is a condition that causes uncomfortable feelings or sensations in the legs, especially while sitting or lying down.  The symptoms often interfere with a person's ability to sleep.  This condition is treated by managing the symptoms. You may need to make lifestyle changes or take medicines. This information is not intended to replace advice given to you by your health care provider. Make sure you discuss any questions you have with your health care provider. Document Revised: 10/13/2017 Document Reviewed: 10/13/2017 Elsevier Patient Education  [place].
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
## 2020-09-28 ENCOUNTER — Other Ambulatory Visit: Payer: Self-pay

## 2020-09-28 ENCOUNTER — Inpatient Hospital Stay (HOSPITAL_COMMUNITY)
Admission: EM | Admit: 2020-09-28 | Discharge: 2020-09-29 | DRG: 391 | Disposition: A | Discharge status: Home / Self Care | Payer: 59 | Attending: Family Medicine | Admitting: Family Medicine

## 2020-09-28 ENCOUNTER — Telehealth: Payer: Self-pay | Admitting: *Deleted

## 2020-09-28 ENCOUNTER — Encounter (HOSPITAL_COMMUNITY): Payer: Self-pay

## 2020-09-28 DIAGNOSIS — Z20822 Contact with and (suspected) exposure to covid-19: Secondary | ICD-10-CM | POA: Diagnosis present

## 2020-09-28 DIAGNOSIS — N401 Enlarged prostate with lower urinary tract symptoms: Secondary | ICD-10-CM

## 2020-09-28 DIAGNOSIS — I10 Essential (primary) hypertension: Secondary | ICD-10-CM | POA: Diagnosis present

## 2020-09-28 DIAGNOSIS — I639 Cerebral infarction, unspecified: Secondary | ICD-10-CM | POA: Diagnosis present

## 2020-09-28 DIAGNOSIS — E43 Unspecified severe protein-calorie malnutrition: Secondary | ICD-10-CM | POA: Diagnosis present

## 2020-09-28 DIAGNOSIS — R609 Edema, unspecified: Secondary | ICD-10-CM | POA: Diagnosis present

## 2020-09-28 DIAGNOSIS — E785 Hyperlipidemia, unspecified: Secondary | ICD-10-CM | POA: Diagnosis present

## 2020-09-28 DIAGNOSIS — N400 Enlarged prostate without lower urinary tract symptoms: Secondary | ICD-10-CM | POA: Diagnosis present

## 2020-09-28 DIAGNOSIS — E538 Deficiency of other specified B group vitamins: Secondary | ICD-10-CM

## 2020-09-28 DIAGNOSIS — Z7982 Long term (current) use of aspirin: Secondary | ICD-10-CM

## 2020-09-28 DIAGNOSIS — F419 Anxiety disorder, unspecified: Secondary | ICD-10-CM | POA: Diagnosis present

## 2020-09-28 DIAGNOSIS — F32A Depression, unspecified: Secondary | ICD-10-CM | POA: Diagnosis present

## 2020-09-28 DIAGNOSIS — Z8249 Family history of ischemic heart disease and other diseases of the circulatory system: Secondary | ICD-10-CM | POA: Diagnosis not present

## 2020-09-28 DIAGNOSIS — K922 Gastrointestinal hemorrhage, unspecified: Secondary | ICD-10-CM | POA: Diagnosis present

## 2020-09-28 DIAGNOSIS — D649 Anemia, unspecified: Secondary | ICD-10-CM | POA: Diagnosis not present

## 2020-09-28 DIAGNOSIS — I6930 Unspecified sequelae of cerebral infarction: Secondary | ICD-10-CM | POA: Diagnosis not present

## 2020-09-28 DIAGNOSIS — R351 Nocturia: Secondary | ICD-10-CM

## 2020-09-28 DIAGNOSIS — K648 Other hemorrhoids: Secondary | ICD-10-CM | POA: Diagnosis present

## 2020-09-28 DIAGNOSIS — D509 Iron deficiency anemia, unspecified: Secondary | ICD-10-CM

## 2020-09-28 DIAGNOSIS — K529 Noninfective gastroenteritis and colitis, unspecified: Secondary | ICD-10-CM | POA: Diagnosis present

## 2020-09-28 DIAGNOSIS — Z932 Ileostomy status: Secondary | ICD-10-CM | POA: Diagnosis not present

## 2020-09-28 DIAGNOSIS — Z66 Do not resuscitate: Secondary | ICD-10-CM | POA: Diagnosis present

## 2020-09-28 DIAGNOSIS — D62 Acute posthemorrhagic anemia: Secondary | ICD-10-CM | POA: Diagnosis present

## 2020-09-28 DIAGNOSIS — F418 Other specified anxiety disorders: Secondary | ICD-10-CM | POA: Diagnosis not present

## 2020-09-28 DIAGNOSIS — Z6820 Body mass index (BMI) 20.0-20.9, adult: Secondary | ICD-10-CM | POA: Diagnosis not present

## 2020-09-28 DIAGNOSIS — Z9889 Other specified postprocedural states: Secondary | ICD-10-CM | POA: Diagnosis not present

## 2020-09-28 DIAGNOSIS — Z79899 Other long term (current) drug therapy: Secondary | ICD-10-CM | POA: Diagnosis not present

## 2020-09-28 DIAGNOSIS — R197 Diarrhea, unspecified: Secondary | ICD-10-CM | POA: Diagnosis not present

## 2020-09-28 DIAGNOSIS — Z980 Intestinal bypass and anastomosis status: Secondary | ICD-10-CM | POA: Diagnosis not present

## 2020-09-28 DIAGNOSIS — R7989 Other specified abnormal findings of blood chemistry: Secondary | ICD-10-CM | POA: Diagnosis not present

## 2020-09-28 DIAGNOSIS — Z87442 Personal history of urinary calculi: Secondary | ICD-10-CM

## 2020-09-28 DIAGNOSIS — K6389 Other specified diseases of intestine: Secondary | ICD-10-CM | POA: Diagnosis not present

## 2020-09-28 DIAGNOSIS — K625 Hemorrhage of anus and rectum: Secondary | ICD-10-CM | POA: Diagnosis present

## 2020-09-28 LAB — CBC WITH DIFFERENTIAL/PLATELET
Abs Immature Granulocytes: 0.02 10*3/uL (ref 0.00–0.07)
Basophils Absolute: 0 10*3/uL (ref 0.0–0.2)
Basophils Absolute: 0 10*3/uL (ref 0.0–0.1)
Basophils Relative: 1 %
Basos: 1 %
EOS (ABSOLUTE): 0.1 10*3/uL (ref 0.0–0.4)
Eos: 1 %
Eosinophils Absolute: 0.1 10*3/uL (ref 0.0–0.5)
Eosinophils Relative: 1 %
HCT: 19.3 % — ABNORMAL LOW (ref 39.0–52.0)
Hematocrit: 20 % — ABNORMAL LOW (ref 37.5–51.0)
Hemoglobin: 5.8 g/dL — CL (ref 13.0–17.7)
Hemoglobin: 5.7 g/dL — CL (ref 13.0–17.0)
Immature Grans (Abs): 0 10*3/uL (ref 0.0–0.1)
Immature Granulocytes: 0 %
Immature Granulocytes: 0 %
Lymphocytes Absolute: 1.6 10*3/uL (ref 0.7–3.1)
Lymphocytes Relative: 15 %
Lymphs Abs: 1.3 10*3/uL (ref 0.7–4.0)
Lymphs: 24 %
MCH: 26.6 pg (ref 26.6–33.0)
MCH: 25.8 pg — ABNORMAL LOW (ref 26.0–34.0)
MCHC: 29 g/dL — ABNORMAL LOW (ref 31.5–35.7)
MCHC: 29.5 g/dL — ABNORMAL LOW (ref 30.0–36.0)
MCV: 92 fL (ref 79–97)
MCV: 87.3 fL (ref 80.0–100.0)
Monocytes Absolute: 0.7 10*3/uL (ref 0.1–0.9)
Monocytes Absolute: 0.9 10*3/uL (ref 0.1–1.0)
Monocytes Relative: 11 %
Monocytes: 11 %
Neutro Abs: 6.1 10*3/uL (ref 1.7–7.7)
Neutrophils Absolute: 4 10*3/uL (ref 1.4–7.0)
Neutrophils Relative %: 72 %
Neutrophils: 63 %
Platelets: 383 10*3/uL (ref 150–450)
Platelets: 386 10*3/uL (ref 150–400)
RBC: 2.18 x10E6/uL — CL (ref 4.14–5.80)
RBC: 2.21 MIL/uL — ABNORMAL LOW (ref 4.22–5.81)
RDW: 17.3 % — ABNORMAL HIGH (ref 11.6–15.4)
RDW: 14.9 % (ref 11.5–15.5)
WBC: 6.4 10*3/uL (ref 3.4–10.8)
WBC: 8.4 10*3/uL (ref 4.0–10.5)
nRBC: 0 % (ref 0.0–0.2)

## 2020-09-28 LAB — COMPREHENSIVE METABOLIC PANEL
ALT: 12 U/L (ref 0–44)
AST: 17 U/L (ref 15–41)
Albumin: 3.7 g/dL (ref 3.5–5.0)
Alkaline Phosphatase: 78 U/L (ref 38–126)
Anion gap: 8 (ref 5–15)
BUN: 16 mg/dL (ref 6–20)
CO2: 28 mmol/L (ref 22–32)
Calcium: 8.6 mg/dL — ABNORMAL LOW (ref 8.9–10.3)
Chloride: 102 mmol/L (ref 98–111)
Creatinine, Ser: 0.97 mg/dL (ref 0.61–1.24)
GFR, Estimated: >60 mL/min (ref >60)
Glucose, Bld: 119 mg/dL — ABNORMAL HIGH (ref 70–99)
Potassium: 3.6 mmol/L (ref 3.5–5.1)
Sodium: 138 mmol/L (ref 135–145)
Total Bilirubin: 0.4 mg/dL (ref 0.3–1.2)
Total Protein: 7.1 g/dL (ref 6.5–8.1)

## 2020-09-28 LAB — POC OCCULT BLOOD, ED: Fecal Occult Bld: POSITIVE — AB

## 2020-09-28 LAB — HEMOGLOBIN AND HEMATOCRIT, BLOOD
HCT: 24.8 % — ABNORMAL LOW (ref 39.0–52.0)
Hemoglobin: 7.8 g/dL — ABNORMAL LOW (ref 13.0–17.0)

## 2020-09-28 LAB — I-STAT CHEM 8, ED
BUN: 15 mg/dL (ref 6–20)
Calcium, Ion: 1.24 mmol/L (ref 1.15–1.40)
Chloride: 101 mmol/L (ref 98–111)
Creatinine, Ser: 1 mg/dL (ref 0.61–1.24)
Glucose, Bld: 116 mg/dL — ABNORMAL HIGH (ref 70–99)
HCT: 19 % — ABNORMAL LOW (ref 39.0–52.0)
Hemoglobin: 6.5 g/dL — CL (ref 13.0–17.0)
Potassium: 3.7 mmol/L (ref 3.5–5.1)
Sodium: 139 mmol/L (ref 135–145)
TCO2: 27 mmol/L (ref 22–32)

## 2020-09-28 LAB — THYROID PANEL WITH TSH
Free Thyroxine Index: 1.7 (ref 1.2–4.9)
T3 Uptake Ratio: 25 % (ref 24–39)
T4, Total: 6.7 ug/dL (ref 4.5–12.0)
TSH: 3.59 u[IU]/mL (ref 0.450–4.500)

## 2020-09-28 LAB — LIPID PANEL
Chol/HDL Ratio: 2 ratio (ref 0.0–5.0)
Cholesterol, Total: 123 mg/dL (ref 100–199)
HDL: 62 mg/dL (ref >39)
LDL Chol Calc (NIH): 27 mg/dL (ref 0–99)
Triglycerides: 224 mg/dL — ABNORMAL HIGH (ref 0–149)
VLDL Cholesterol Cal: 34 mg/dL (ref 5–40)

## 2020-09-28 LAB — IRON,TIBC AND FERRITIN PANEL
Ferritin: 11 ng/mL — ABNORMAL LOW (ref 30–400)
Iron Saturation: 2 % — CL (ref 15–55)
Iron: 7 ug/dL — CL (ref 38–169)
Total Iron Binding Capacity: 448 ug/dL (ref 250–450)
UIBC: 441 ug/dL — ABNORMAL HIGH (ref 111–343)

## 2020-09-28 LAB — PREPARE RBC (CROSSMATCH)

## 2020-09-28 LAB — RESP PANEL BY RT-PCR (FLU A&B, COVID) ARPGX2
Influenza A by PCR: NEGATIVE
Influenza B by PCR: NEGATIVE
SARS Coronavirus 2 by RT PCR: NEGATIVE

## 2020-09-28 LAB — VITAMIN D 25 HYDROXY (VIT D DEFICIENCY, FRACTURES): Vit D, 25-Hydroxy: 35.06 ng/mL (ref 30–100)

## 2020-09-28 LAB — HIV ANTIBODY (ROUTINE TESTING W REFLEX): HIV Screen 4th Generation wRfx: NONREACTIVE

## 2020-09-28 LAB — VITAMIN B12: Vitamin B-12: 169 pg/mL — ABNORMAL LOW (ref 232–1245)

## 2020-09-28 LAB — PROTIME-INR
INR: 1 (ref 0.8–1.2)
Prothrombin Time: 12.9 seconds (ref 11.4–15.2)

## 2020-09-28 MED ORDER — BACLOFEN 10 MG PO TABS: 10.0000 mg | ORAL_TABLET | Freq: Every evening | ORAL | Status: DC | PRN

## 2020-09-28 MED ORDER — ROPINIROLE HCL 0.25 MG PO TABS
0.5000 mg | ORAL_TABLET | Freq: Every day | ORAL | Status: DC
  Administered 2020-09-28: 0.5 mg via ORAL
  Filled 2020-09-28: qty 1
  Filled 2020-09-28: qty 2

## 2020-09-28 MED ORDER — ONDANSETRON HCL 4 MG/2ML IJ SOLN: 4.0000 mg | Freq: Four times a day (QID) | INTRAMUSCULAR | Status: DC | PRN

## 2020-09-28 MED ORDER — PANTOPRAZOLE SODIUM 40 MG IV SOLR
40.0000 mg | Freq: Two times a day (BID) | INTRAVENOUS | Status: DC
  Administered 2020-09-28 – 2020-09-29 (×2): 40 mg via INTRAVENOUS
  Filled 2020-09-28 (×2): qty 40

## 2020-09-28 MED ORDER — CYANOCOBALAMIN 1000 MCG/ML IJ SOLN
1000.0000 ug | INTRAMUSCULAR | Status: DC
  Administered 2020-09-28: 1000 ug via INTRAMUSCULAR
  Filled 2020-09-28: qty 1

## 2020-09-28 MED ORDER — ACETAMINOPHEN 325 MG PO TABS: 650.0000 mg | ORAL_TABLET | Freq: Four times a day (QID) | ORAL | Status: DC | PRN

## 2020-09-28 MED ORDER — HYDROCORTISONE (PERIANAL) 2.5 % EX CREA
TOPICAL_CREAM | Freq: Three times a day (TID) | CUTANEOUS | Status: DC
  Filled 2020-09-28: qty 28.35

## 2020-09-28 MED ORDER — ACETAMINOPHEN 650 MG RE SUPP: 650.0000 mg | Freq: Four times a day (QID) | RECTAL | Status: DC | PRN

## 2020-09-28 MED ORDER — PEG 3350-KCL-NA BICARB-NACL 420 G PO SOLR
4000.0000 mL | Freq: Once | ORAL | Status: AC
  Administered 2020-09-28: 4000 mL via ORAL

## 2020-09-28 MED ORDER — VITAMIN B-12 1000 MCG PO TABS
1000.0000 ug | ORAL_TABLET | Freq: Every day | ORAL | Status: DC
  Administered 2020-09-29: 1000 ug via ORAL
  Filled 2020-09-28: qty 1

## 2020-09-28 MED ORDER — SODIUM CHLORIDE 0.9% IV SOLUTION: Freq: Once | INTRAVENOUS | Status: AC

## 2020-09-28 MED ORDER — PEG 3350-KCL-NABCB-NACL-NASULF 236 G PO SOLR
4000.0000 mL | Freq: Once | ORAL | Status: DC
  Filled 2020-09-28: qty 4000

## 2020-09-28 MED ORDER — ATORVASTATIN CALCIUM 10 MG PO TABS
10.0000 mg | ORAL_TABLET | Freq: Every evening | ORAL | Status: DC
  Administered 2020-09-28: 10 mg via ORAL
  Filled 2020-09-28: qty 1

## 2020-09-28 MED ORDER — METHOCARBAMOL 500 MG PO TABS: 750.0000 mg | ORAL_TABLET | Freq: Three times a day (TID) | ORAL | Status: DC | PRN

## 2020-09-28 MED ORDER — PANTOPRAZOLE SODIUM 40 MG IV SOLR
40.0000 mg | INTRAVENOUS | Status: AC
  Administered 2020-09-28: 40 mg via INTRAVENOUS
  Filled 2020-09-28: qty 40

## 2020-09-28 MED ORDER — ADULT MULTIVITAMIN W/MINERALS CH
1.0000 | ORAL_TABLET | Freq: Every day | ORAL | Status: DC
  Administered 2020-09-29: 1 via ORAL
  Filled 2020-09-28: qty 1

## 2020-09-28 MED ORDER — ONDANSETRON HCL 4 MG PO TABS: 4.0000 mg | ORAL_TABLET | Freq: Four times a day (QID) | ORAL | Status: DC | PRN
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
## 2020-09-28 NOTE — Telephone Encounter (Signed)
Late entry from this AM around [place] I called the pt advising his hemoglobin was critically low at 5.8. I had spoken with Janett Billow NP who was made aware, also Dr Jaynee Eagles aware. I advised the pt to proceed to the ER as soon as possible as there are concerns for internal bleeding. Pt and his wife's questions were answered. They verbalized appreciation for the call and will proceed to ER.

## 2020-09-28 NOTE — Progress Notes (Signed)
I agree with the above plan 

## 2020-09-28 NOTE — ED Notes (Signed)
ED TO INPATIENT HANDOFF REPORT  ED Nurse Name and Phone #:   S Name/Age/Gender [name] 59 y.o. male Room/Bed: APA09/APA09  Code Status   Code Status: DNR  Home/SNF/Other Home Patient oriented to: self, place, time and situation Is this baseline? Yes   Triage Complete: Triage complete  Chief Complaint Acute GI bleeding [K92.2]  Triage Note Pt to ED due to Dr. Lenon Ahmadi his hemoglobin 5.7, Pt also stated he has had some rectal bleeding daily. Pt verbalized he would like to be a DNR.  Pt has had surgery in AUG for reversal intestines. Had a  Stroke in Nov. 2020.    Allergies No Known Allergies  Level of Care/Admitting Diagnosis ED Disposition    ED Disposition Condition [facility]: [facility] [id number]  Level of Care: Med-Surg [16]  Covid Evaluation: Asymptomatic Screening Protocol (No Symptoms)  Diagnosis: Acute GI bleeding [id number]  Admitting Physician: [place], [place]  Attending Physician: Murlean Iba [[id number]]  Estimated length of stay: past midnight tomorrow  Certification:: I certify this patient will need inpatient services for at least 2 midnights       B Medical/Surgery History Past Medical History:  Diagnosis Date   History of kidney stones    Hypertension    Low testosterone    Stroke ([place]) 2020   Past Surgical History:  Procedure Laterality Date   APPLICATION OF WOUND VAC  07/29/2019   Procedure: Application Of Wound Vac;  Surgeon: Georganna Skeans, MD;  Location: [place];  Service: General;;   APPLICATION OF WOUND VAC  07/31/2019   Procedure: Application Of Wound Vac;  Surgeon: Jesusita Oka, MD;  Location: [place];  Service: General;;   bone spur surgery Right    BOWEL RESECTION N/A 07/28/2019   Procedure: Small Bowel Resection extended illeosintectomy;  Surgeon: Jerid Boston, MD;  Location: [place];  Service: General;  Laterality: N/A;   CLOSED REDUCTION WRIST FRACTURE Left 07/28/2019    Procedure: Closed Reduction Wrist;  Surgeon: Meredith Pel, MD;  Location: [place];  Service: Orthopedics;  Laterality: Left;   COLOSTOMY REVERSAL N/A 05/23/2020   Procedure: END ILEOSTOMY REVERSAL;  Surgeon: Jesusita Oka, MD;  Location: [place];  Service: General;  Laterality: N/A;   EXTRACORPOREAL SHOCK WAVE LITHOTRIPSY Left 04/16/2018   Procedure: LEFT EXTRACORPOREAL SHOCK WAVE LITHOTRIPSY (ESWL);  Surgeon: Cleon Gustin, MD;  Location: [facility];  Service: Urology;  Laterality: Left;   EYE SURGERY     FEMUR IM NAIL Right 07/28/2019   Procedure: INTRAMEDULLARY (IM) NAIL FEMORAL;  Surgeon: Meredith Pel, MD;  Location: [place];  Service: Orthopedics;  Laterality: Right;   FLEXIBLE SIGMOIDOSCOPY N/A 02/01/2020   Procedure: FLEXIBLE SIGMOIDOSCOPY;  Surgeon: Jesusita Oka, MD;  Location: [facility];  Service: General;  Laterality: N/A;   IRRIGATION AND DEBRIDEMENT KNEE Left 07/28/2019   Procedure: Irrigation And Debridement  Left Knee;  Surgeon: Meredith Pel, MD;  Location: [place];  Service: Orthopedics;  Laterality: Left;   KNEE CLOSED REDUCTION Right 09/10/2019   Procedure: CLOSED MANIPULATION KNEE;  Surgeon: Shona Needles, MD;  Location: [place];  Service: Orthopedics;  Laterality: Right;   LAPAROTOMY N/A 07/29/2019   Procedure: EXPLORATORY LAPAROTOMY, ileocecectomy;  Surgeon: Georganna Skeans, MD;  Location: [place];  Service: General;  Laterality: N/A;   LAPAROTOMY N/A 07/28/2019   Procedure: EXPLORATORY LAPAROTOMY WITH MESENTERIC REPAIR;  Surgeon: Nyzir Boston, MD;  Location: [place];  Service:  General;  Laterality: N/A;   LAPAROTOMY N/A 07/31/2019   Procedure: RE-EXPLORATORY LAPAROTOMY, RESECTION OF TRANSVERSE, LEFT, AND SIGMOID COLON, CREATION OF ILEOSTOMY,  PRIMARY FASCIAL CLOSURE;  Surgeon: Jesusita Oka, MD;  Location: [place];  Service: General;  Laterality: N/A;   LAPAROTOMY N/A 05/23/2020   Procedure: EXPLORATORY LAPAROTOMY;  Surgeon: Jesusita Oka, MD;   Location: [place];  Service: General;  Laterality: N/A;   LITHOTRIPSY     ORIF ANKLE FRACTURE Right 08/04/2019   Procedure: Open Reduction Internal Fixation (Orif) Ankle Fracture;  Surgeon: Shona Needles, MD;  Location: [place];  Service: Orthopedics;  Laterality: Right;   ORIF CLAVICULAR FRACTURE Left 08/04/2019   Procedure: OPEN REDUCTION INTERNAL FIXATION (ORIF) CLAVICULAR FRACTURE;  Surgeon: Shona Needles, MD;  Location: [place];  Service: Orthopedics;  Laterality: Left;   ORIF WRIST FRACTURE Left 08/04/2019   Procedure: OPEN REDUCTION INTERNAL FIXATION (ORIF) WRIST FRACTURE;  Surgeon: Shona Needles, MD;  Location: [place];  Service: Orthopedics;  Laterality: Left;     A IV Location/Drains/Wounds Patient Lines/Drains/Airways Status    Active Line/Drains/Airways    Name Placement date Placement time Site Days   Peripheral IV 09/28/20 Right Antecubital 09/28/20  1036  Antecubital  less than 1   Peripheral IV 09/28/20 Right Forearm 09/28/20  1037  Forearm  less than 1   Closed System Drain 1 Left LLQ Bulb (JP) 05/23/20  1150  LLQ  128   Incision (Closed) 07/29/19 Knee Left 07/29/19  0215  -- 427   Incision (Closed) 07/31/19 Abdomen Other (Comment) 07/31/19  1013  -- 425   Incision (Closed) 08/04/19 Arm Left 08/04/19  1104  -- 421   Incision (Closed) 08/04/19 Leg Right 08/04/19  1207  -- 421   Incision (Closed) 05/23/20 Abdomen 05/23/20  1154  -- 128          Intake/Output Last 24 hours  Intake/Output Summary (Last 24 hours) at 09/28/2020 1353 Last data filed at 09/28/2020 1245 Gross per 24 hour  Intake 630 ml  Output --  Net 630 ml    Labs/Imaging Results for orders placed or performed during the hospital encounter of 09/28/20 (from the past 48 hour(s))  Comprehensive metabolic panel     Status: Abnormal   Collection Time: 09/28/20  9:57 AM  Result Value Ref Range   Sodium 138 135 - 145 mmol/L   Potassium 3.6 3.5 - 5.1 mmol/L   Chloride 102 98 - 111 mmol/L   CO2 28  22 - 32 mmol/L   Glucose, Bld 119 (H) 70 - 99 mg/dL    Comment: Glucose reference range applies only to samples taken after fasting for at least 8 hours.   BUN 16 6 - 20 mg/dL   Creatinine, Ser 0.97 0.61 - 1.24 mg/dL   Calcium 8.6 (L) 8.9 - 10.3 mg/dL   Total Protein 7.1 6.5 - 8.1 g/dL   Albumin 3.7 3.5 - 5.0 g/dL   AST 17 15 - 41 U/L   ALT 12 0 - 44 U/L   Alkaline Phosphatase 78 38 - 126 U/L   Total Bilirubin 0.4 0.3 - 1.2 mg/dL   GFR, Estimated >60 >60 mL/min    Comment: (NOTE) Calculated using the CKD-EPI Creatinine Equation (2021)    Anion gap 8 5 - 15    Comment: Performed at [facility], [address] [place], [place] [address]  CBC WITH DIFFERENTIAL     Status: Abnormal   Collection Time: 09/28/20  9:57  AM  Result Value Ref Range   WBC 8.4 4.0 - 10.5 K/uL   RBC 2.21 (L) 4.22 - 5.81 MIL/uL   Hemoglobin 5.7 (LL) 13.0 - 17.0 g/dL    Comment: This critical result has verified and been called to HOLOCOMB,R by Lorette Ang on 12 23 2021 at 1056, and has been read back.    HCT 19.3 (L) 39.0 - 52.0 %   MCV 87.3 80.0 - 100.0 fL   MCH 25.8 (L) 26.0 - 34.0 pg   MCHC 29.5 (L) 30.0 - 36.0 g/dL   RDW 14.9 11.5 - 15.5 %   Platelets 386 150 - 400 K/uL   nRBC 0.0 0.0 - 0.2 %   Neutrophils Relative % 72 %   Neutro Abs 6.1 1.7 - 7.7 K/uL   Lymphocytes Relative 15 %   Lymphs Abs 1.3 0.7 - 4.0 K/uL   Monocytes Relative 11 %   Monocytes Absolute 0.9 0.1 - 1.0 K/uL   Eosinophils Relative 1 %   Eosinophils Absolute 0.1 0.0 - 0.5 K/uL   Basophils Relative 1 %   Basophils Absolute 0.0 0.0 - 0.1 K/uL   WBC Morphology TOXIC GRANULATION    Immature Granulocytes 0 %   Abs Immature Granulocytes 0.02 0.00 - 0.07 K/uL    Comment: Performed at [facility], [address], [place], [place] [address]  Protime-INR     Status: None   Collection Time: 09/28/20  9:57 AM  Result Value Ref Range   Prothrombin Time 12.9 11.4 - 15.2 seconds   INR 1.0 0.8 - 1.2    Comment: (NOTE) INR goal varies  based on device and disease states. Performed at [facility], [address] [place], [place] [address]   Type and screen [facility]     Status: None (Preliminary result)   Collection Time: 09/28/20  9:59 AM  Result Value Ref Range   ABO/RH(D) A POS    Antibody Screen NEG    Sample Expiration 10/01/2020,2359    Unit Number [id number]    Blood Component Type RED CELLS,LR    Unit division 00    Status of Unit ISSUED    Transfusion Status OK TO TRANSFUSE    Crossmatch Result      Compatible Performed at [facility], [address] [place], [place] [address]    Unit Number [id number]    Blood Component Type RED CELLS,LR    Unit division 00    Status of Unit ALLOCATED    Transfusion Status OK TO TRANSFUSE    Crossmatch Result Compatible   Resp Panel by RT-PCR (Flu A&B, Covid) Nasopharyngeal Swab     Status: None   Collection Time: 09/28/20 10:00 AM   Specimen: Nasopharyngeal Swab; Nasopharyngeal(NP) swabs in vial transport medium  Result Value Ref Range   SARS Coronavirus 2 by RT PCR NEGATIVE NEGATIVE    Comment: (NOTE) SARS-CoV-2 target nucleic acids are NOT DETECTED.  The SARS-CoV-2 RNA is generally detectable in upper respiratory specimens during the acute phase of infection. The lowest concentration of SARS-CoV-2 viral copies this assay can detect is 138 copies/mL. A negative result does not preclude SARS-Cov-2 infection and should not be used as the sole basis for treatment or other patient management decisions. A negative result may occur with  improper specimen collection/handling, submission of specimen other than nasopharyngeal swab, presence of viral mutation(s) within the areas targeted by this assay, and inadequate number of viral copies(<138 copies/mL). A negative result must be combined with  clinical observations, patient history, and epidemiological information. The expected result is Negative.  Fact Sheet for Patients:   EntrepreneurPulse.com.au  Fact Sheet for Healthcare Providers:  IncredibleEmployment.be  This test is no t yet approved or cleared by the [place] FDA and  has been authorized for detection and/or diagnosis of SARS-CoV-2 by FDA under an Emergency Use Authorization (EUA). This EUA will remain  in effect (meaning this test can be used) for the duration of the COVID-19 declaration under Section 564(b)(1) of the Act, 21 U.S.C.section 360bbb-3(b)(1), unless the authorization is terminated  or revoked sooner.       Influenza A by PCR NEGATIVE NEGATIVE   Influenza B by PCR NEGATIVE NEGATIVE    Comment: (NOTE) The Xpert Xpress SARS-CoV-2/FLU/RSV plus assay is intended as an aid in the diagnosis of influenza from Nasopharyngeal swab specimens and should not be used as a sole basis for treatment. Nasal washings and aspirates are unacceptable for Xpert Xpress SARS-CoV-2/FLU/RSV testing.  Fact Sheet for Patients: EntrepreneurPulse.com.au  Fact Sheet for Healthcare Providers: IncredibleEmployment.be  This test is not yet approved or cleared by the [place] FDA and has been authorized for detection and/or diagnosis of SARS-CoV-2 by FDA under an Emergency Use Authorization (EUA). This EUA will remain in effect (meaning this test can be used) for the duration of the COVID-19 declaration under Section 564(b)(1) of the Act, 21 U.S.C. section 360bbb-3(b)(1), unless the authorization is terminated or revoked.  Performed at [facility], [address], [place], [place] [address]   POC occult blood, ED Provider will collect     Status: Abnormal   Collection Time: 09/28/20 10:15 AM  Result Value Ref Range   Fecal Occult Bld POSITIVE (A) NEGATIVE  I-stat chem 8, ED (not at [facility] or [facility])     Status: Abnormal   Collection Time: 09/28/20 10:20 AM  Result Value Ref Range   Sodium 139 135 - 145 mmol/L   Potassium 3.7  3.5 - 5.1 mmol/L   Chloride 101 98 - 111 mmol/L   BUN 15 6 - 20 mg/dL   Creatinine, Ser 1.00 0.61 - 1.24 mg/dL   Glucose, Bld 116 (H) 70 - 99 mg/dL    Comment: Glucose reference range applies only to samples taken after fasting for at least 8 hours.   Calcium, Ion 1.24 1.15 - 1.40 mmol/L   TCO2 27 22 - 32 mmol/L   Hemoglobin 6.5 (LL) 13.0 - 17.0 g/dL   HCT 19.0 (L) 39.0 - 52.0 %   Comment NOTIFIED PHYSICIAN   Prepare RBC (crossmatch)     Status: None   Collection Time: 09/28/20 11:48 AM  Result Value Ref Range   Order Confirmation      ORDER PROCESSED BY BLOOD BANK Performed at [facility], [address], [place], [place] [address]    No results found.  Pending Labs Unresulted Labs (From admission, onward)          Start     Ordered   09/29/20 [id number]  Basic metabolic panel  Tomorrow morning,   R        09/28/20 1215   09/29/20 0500  CBC  Daily,   R      09/28/20 1215   09/28/20 1216  VITAMIN D 25 Hydroxy (Vit-D Deficiency, Fractures)  Add-on,   AD        09/28/20 1215   09/28/20 1212  HIV Antibody (routine testing w rflx)  (HIV Antibody (Routine testing w reflex) panel)  Once,  STAT        09/28/20 1215          Vitals/Pain Today's Vitals   09/28/20 1222 09/28/20 1245 09/28/20 1245 09/28/20 1330  BP: 120/79 113/77 113/77 110/69  Pulse: 95 92 99 99  Resp: (!) 22 15 18    Temp: 98.2 F (36.8 C)  97.9 F (36.6 C)   TempSrc: Oral  Oral   SpO2: 100% 100% 100% 100%  Weight:      Height:      PainSc:        Isolation Precautions No active isolations  Medications Medications  atorvastatin (LIPITOR) tablet 10 mg (has no administration in time range)  baclofen (LIORESAL) tablet 10 mg (has no administration in time range)  methocarbamol (ROBAXIN) tablet 750 mg (has no administration in time range)  rOPINIRole (REQUIP) tablet 0.5 mg (has no administration in time range)  multivitamin with minerals tablet 1 tablet (has no administration in time range)  acetaminophen  (TYLENOL) tablet 650 mg (has no administration in time range)    Or  acetaminophen (TYLENOL) suppository 650 mg (has no administration in time range)  ondansetron (ZOFRAN) tablet 4 mg (has no administration in time range)    Or  ondansetron (ZOFRAN) injection 4 mg (has no administration in time range)  pantoprazole (PROTONIX) injection 40 mg (has no administration in time range)  cyanocobalamin ((VITAMIN B-12)) injection 1,000 mcg (has no administration in time range)  vitamin B-12 (CYANOCOBALAMIN) tablet 1,000 mcg (has no administration in time range)  pantoprazole (PROTONIX) injection 40 mg (40 mg Intravenous Given 09/28/20 1055)  0.9 %  sodium chloride infusion (Manually program via Guardrails IV Fluids) ( Intravenous New Bag/Given 09/28/20 1211)    Mobility walks High fall risk   Focused Assessments    R Recommendations: See Admitting Provider Note  Report given to:   Additional Notes:
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
## 2020-09-28 NOTE — ED Triage Notes (Addendum)
Pt to ED due to Dr. Lenon Ahmadi his hemoglobin 5.7, Pt also stated he has had some rectal bleeding daily. Pt verbalized he would like to be a DNR.  Pt has had surgery in AUG for reversal intestines. Had a  Stroke in Nov. 2020.
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
## 2020-09-29 ENCOUNTER — Encounter (HOSPITAL_COMMUNITY): Payer: Self-pay | Admitting: Family Medicine

## 2020-09-29 ENCOUNTER — Encounter (HOSPITAL_COMMUNITY)
Admission: EM | Disposition: A | Discharge status: Home / Self Care | Payer: Self-pay | Source: Home / Self Care | Attending: Family Medicine

## 2020-09-29 DIAGNOSIS — K6389 Other specified diseases of intestine: Secondary | ICD-10-CM

## 2020-09-29 DIAGNOSIS — E43 Unspecified severe protein-calorie malnutrition: Secondary | ICD-10-CM | POA: Insufficient documentation

## 2020-09-29 DIAGNOSIS — K625 Hemorrhage of anus and rectum: Secondary | ICD-10-CM

## 2020-09-29 DIAGNOSIS — Z980 Intestinal bypass and anastomosis status: Secondary | ICD-10-CM

## 2020-09-29 DIAGNOSIS — K529 Noninfective gastroenteritis and colitis, unspecified: Principal | ICD-10-CM

## 2020-09-29 DIAGNOSIS — R197 Diarrhea, unspecified: Secondary | ICD-10-CM

## 2020-09-29 DIAGNOSIS — F418 Other specified anxiety disorders: Secondary | ICD-10-CM

## 2020-09-29 DIAGNOSIS — E538 Deficiency of other specified B group vitamins: Secondary | ICD-10-CM

## 2020-09-29 HISTORY — PX: FLEXIBLE SIGMOIDOSCOPY: SHX5431

## 2020-09-29 HISTORY — PX: BIOPSY: SHX5522

## 2020-09-29 LAB — BPAM RBC
Blood Product Expiration Date: 202112302359
Blood Product Expiration Date: 202201182359
ISSUE DATE / TIME: 202112231219
ISSUE DATE / TIME: 202112231558
Unit Type and Rh: 600
Unit Type and Rh: 6200

## 2020-09-29 LAB — TYPE AND SCREEN
ABO/RH(D): A POS
Antibody Screen: NEGATIVE
Unit division: 0
Unit division: 0

## 2020-09-29 LAB — BASIC METABOLIC PANEL
Anion gap: 11 (ref 5–15)
BUN: 13 mg/dL (ref 6–20)
CO2: 26 mmol/L (ref 22–32)
Calcium: 9.1 mg/dL (ref 8.9–10.3)
Chloride: 101 mmol/L (ref 98–111)
Creatinine, Ser: 0.85 mg/dL (ref 0.61–1.24)
GFR, Estimated: >60 mL/min (ref >60)
Glucose, Bld: 96 mg/dL (ref 70–99)
Potassium: 3.8 mmol/L (ref 3.5–5.1)
Sodium: 138 mmol/L (ref 135–145)

## 2020-09-29 LAB — CBC
HCT: 25.5 % — ABNORMAL LOW (ref 39.0–52.0)
Hemoglobin: 8.1 g/dL — ABNORMAL LOW (ref 13.0–17.0)
MCH: 27.1 pg (ref 26.0–34.0)
MCHC: 31.8 g/dL (ref 30.0–36.0)
MCV: 85.3 fL (ref 80.0–100.0)
Platelets: 378 10*3/uL (ref 150–400)
RBC: 2.99 MIL/uL — ABNORMAL LOW (ref 4.22–5.81)
RDW: 14.7 % (ref 11.5–15.5)
WBC: 7.9 10*3/uL (ref 4.0–10.5)
nRBC: 0 % (ref 0.0–0.2)

## 2020-09-29 SURGERY — SIGMOIDOSCOPY, FLEXIBLE
Anesthesia: Moderate Sedation

## 2020-09-29 MED ORDER — MEPERIDINE HCL 50 MG/ML IJ SOLN
INTRAMUSCULAR | Status: DC | PRN
  Administered 2020-09-29 (×2): 25 mg via INTRAVENOUS

## 2020-09-29 MED ORDER — MIDAZOLAM HCL 5 MG/5ML IJ SOLN
INTRAMUSCULAR | Status: DC | PRN
  Administered 2020-09-29 (×3): 2 mg via INTRAVENOUS

## 2020-09-29 MED ORDER — OMEPRAZOLE 20 MG PO CPDR: 20.0000 mg | DELAYED_RELEASE_CAPSULE | Freq: Every day | ORAL | 1 refills | Status: DC

## 2020-09-29 MED ORDER — ASPIRIN EC 81 MG PO TBEC: 81.0000 mg | DELAYED_RELEASE_TABLET | Freq: Every day | ORAL | 11 refills | Status: DC

## 2020-09-29 MED ORDER — SODIUM CHLORIDE 0.9 % IV SOLN
INTRAVENOUS | Status: DC
Start: 2020-09-29 — End: 2020-09-29

## 2020-09-29 MED ORDER — SIMETHICONE 40 MG/0.6ML PO SUSP
ORAL | Status: AC
  Filled 2020-09-29: qty 0.6

## 2020-09-29 MED ORDER — MEPERIDINE HCL 50 MG/ML IJ SOLN
INTRAMUSCULAR | Status: AC
  Filled 2020-09-29: qty 1

## 2020-09-29 MED ORDER — LOPERAMIDE HCL 2 MG PO CAPS: 2.0000 mg | ORAL_CAPSULE | Freq: Three times a day (TID) | ORAL | 1 refills | Status: AC

## 2020-09-29 MED ORDER — HYDROCORTISONE (PERIANAL) 2.5 % EX CREA: TOPICAL_CREAM | Freq: Three times a day (TID) | CUTANEOUS | 0 refills | Status: DC

## 2020-09-29 MED ORDER — CYANOCOBALAMIN 2500 MCG PO TABS: 2500.0000 ug | ORAL_TABLET | Freq: Every day | ORAL | 2 refills | Status: DC

## 2020-09-29 MED ORDER — STERILE WATER FOR IRRIGATION IR SOLN
Status: DC | PRN
  Administered 2020-09-29: 100 mL

## 2020-09-29 MED ORDER — LOPERAMIDE HCL 2 MG PO CAPS
2.0000 mg | ORAL_CAPSULE | Freq: Three times a day (TID) | ORAL | Status: DC
  Administered 2020-09-29: 2 mg via ORAL
  Filled 2020-09-29: qty 1

## 2020-09-29 MED ORDER — MIDAZOLAM HCL 5 MG/5ML IJ SOLN
INTRAMUSCULAR | Status: AC
  Filled 2020-09-29: qty 10
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
## 2020-09-29 NOTE — Progress Notes (Signed)
Pt discharged to :POV via [place] with all personal belongings in his possession.

## 2020-09-29 NOTE — Progress Notes (Signed)
Brief flexible sigmoidoscopy note.  Edema to mucosa of distal small bowel proximal to anastomosis.  Biopsies taken Erosions noted at ileocolonic anastomosis without bleeding. Focal edema to mucosa of distal sigmoid colon.  Biopsies taken Anorectal junction unremarkable.
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
## 2020-09-29 NOTE — Op Note (Signed)
[facility] Patient Name: [name] Procedure Date: 09/29/2020 9:58 AM MRN: [id number] Date of Birth: Nov 04, 1960 Attending MD: Hildred Laser , MD CSN: [id number] Age: 59 Admit Type: Inpatient Procedure:                Colonoscopy Indications:              Diarrhea, Rectal bleeding Providers:                Hildred Laser, MD, Rosina Lowenstein, RN, Randa Spike, Technician Referring MD:             Irwin Brakeman, MD. Medicines:                Meperidine 50 mg IV, Midazolam 6 mg IV Complications:            No immediate complications. Estimated Blood Loss:     Estimated blood loss was minimal. Estimated blood                            loss was minimal. Procedure:                Pre-Anesthesia Assessment:                           - Prior to the procedure, a History and Physical                            was performed, and patient medications and                            allergies were reviewed. The patient's tolerance of                            previous anesthesia was also reviewed. The risks                            and benefits of the procedure and the sedation                            options and risks were discussed with the patient.                            All questions were answered, and informed consent                            was obtained. Prior Anticoagulants: The patient has                            taken no previous anticoagulant or antiplatelet                            agents except for aspirin. ASA Grade Assessment:  III - A patient with severe systemic disease. After                            reviewing the risks and benefits, the patient was                            deemed in satisfactory condition to undergo the                            procedure.                           After obtaining informed consent, the colonoscope                            was passed under direct vision.  Throughout the                            procedure, the patient's blood pressure, pulse, and                            oxygen saturations were monitored continuously. The                            [id number]) was introduced through the anus                            and advanced to the 20 cm into the ileum. The                            colonoscopy was performed without difficulty. The                            patient tolerated the procedure well. The quality                            of the bowel preparation was excellent. Scope In: 10:25:16 AM Scope Out: 10:37:02 AM Total Procedure Duration: 0 hours 11 minutes 46 seconds  Findings:      The perianal and digital rectal examinations were normal.      There was evidence of a prior end-to-end ileo-colonic anastomosis in the       distal sigmoid colon. This was patent and was characterized by       congestion, edema and erosion. The anastomosis was traversed.      A diffuse area of mucosa in the neo-terminal Ileum was granular.       Biopsies were taken with a cold forceps for histology. The pathology       specimen was placed into Bottle Number 1. Verification of patient       identification for the specimen was      A patchy area of mildly erythematous mucosa was found in the distal       sigmoid colon. This was biopsied with a cold forceps for histology. The       pathology specimen was placed into Bottle Number 2.  Internal hemorrhoids were found during retroflexion. The hemorrhoids       were small. Impression:               - Patent end-to-end ileo-colonic anastomosis,                            characterized by congestion, edema and erosion.                           - Granularity in the neo-terminal ileum. Biopsied.                           - Erythematous edematous mucosa with erosions in                            the distal sigmoid colon. Biopsied.                           - Internal hemorrhoids. Moderate  Sedation:      Moderate (conscious) sedation was administered by the endoscopy nurse       and supervised by the endoscopist. The following parameters were       monitored: oxygen saturation, heart rate, blood pressure, CO2       capnography and response to care. Total physician intraservice time was       18 minutes. Recommendation:           - Return patient to hospital ward for ongoing care.                           - Cardiac diet today.                           - Continue present medications.                           - No aspirin, ibuprofen, naproxen, or other                            non-steroidal anti-inflammatory drugs for 1 day.                           - Await pathology results.                           - Loperamide 2 mg p.o. before meals and nightly. Procedure Code(s):        --- Professional ---                           [phone], Colonoscopy, flexible; with biopsy, single                            or multiple                           G0500, Moderate sedation services provided by the  same physician or other qualified health care                            professional performing a gastrointestinal                            endoscopic service that sedation supports,                            requiring the presence of an independent trained                            observer to assist in the monitoring of the                            patient's level of consciousness and physiological                            status; initial 15 minutes of intra-service time;                            patient age 27 years or older (additional time may                            be reported with [phone], as appropriate) Diagnosis Code(s):        --- Professional ---                           Z98.0, Intestinal bypass and anastomosis status                           K63.89, Other specified diseases of intestine                           R19.7, Diarrhea,  unspecified                           K62.5, Hemorrhage of anus and rectum CPT copyright 2019 American Medical Association. All rights reserved. The codes documented in this report are preliminary and upon coder review may  be revised to meet current compliance requirements. Hildred Laser, MD Hildred Laser, MD 09/29/2020 11:01:00 AM This report has been signed electronically. Number of Addenda: 0

## 2020-09-29 NOTE — Progress Notes (Signed)
Pt back to room via WC from Endo post procedure. Pt alert and oriented, denies c/o at present. Tolerating oral fluids without n/v. Dr. Laural Golden in to see pt and wife and updated on findings from procedure and plan for day. Also, dietician in to counsel pt and wife.
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
## 2020-10-02 ENCOUNTER — Telehealth: Payer: Self-pay | Admitting: Gastroenterology

## 2020-10-02 NOTE — Telephone Encounter (Signed)
Dx IDA, B12 deficiency, rectal bleeding, Short gut syndrome. Preferably appt with Dr. Jena Gauss if available.

## 2020-10-03 ENCOUNTER — Encounter (HOSPITAL_COMMUNITY): Payer: Self-pay | Admitting: Internal Medicine

## 2020-10-03 ENCOUNTER — Ambulatory Visit: Payer: 59 | Admitting: Adult Health

## 2020-10-03 ENCOUNTER — Encounter: Payer: Self-pay | Admitting: Internal Medicine

## 2020-10-03 LAB — SURGICAL PATHOLOGY

## 2020-10-03 NOTE — Telephone Encounter (Signed)
PATIENT SCHEDULED  °

## 2020-10-17 ENCOUNTER — Ambulatory Visit: Payer: 59 | Admitting: Family

## 2020-10-21 ENCOUNTER — Other Ambulatory Visit: Payer: Self-pay | Admitting: Physical Medicine and Rehabilitation

## 2020-10-21 ENCOUNTER — Other Ambulatory Visit: Payer: Self-pay | Admitting: Family

## 2020-10-21 DIAGNOSIS — I10 Essential (primary) hypertension: Secondary | ICD-10-CM

## 2020-10-23 MED ORDER — AMLODIPINE BESYLATE 5 MG PO TABS: 5.0000 mg | ORAL_TABLET | Freq: Every day | ORAL | 0 refills | Status: DC

## 2020-10-23 MED ORDER — AMLODIPINE BESYLATE 5 MG PO TABS: 5.0000 mg | ORAL_TABLET | Freq: Every day | ORAL | Status: DC

## 2020-10-23 NOTE — Addendum Note (Signed)
Addended by: Antonietta Barcelona D on: 10/23/2020 10:30 AM   Modules accepted: Orders

## 2020-10-26 ENCOUNTER — Other Ambulatory Visit: Payer: Self-pay | Admitting: Family

## 2020-10-26 ENCOUNTER — Other Ambulatory Visit: Payer: Self-pay | Admitting: Physical Medicine and Rehabilitation

## 2020-10-26 DIAGNOSIS — E785 Hyperlipidemia, unspecified: Secondary | ICD-10-CM

## 2020-10-26 DIAGNOSIS — I6930 Unspecified sequelae of cerebral infarction: Secondary | ICD-10-CM

## 2020-10-27 ENCOUNTER — Other Ambulatory Visit: Payer: Self-pay | Admitting: Physical Medicine and Rehabilitation

## 2020-11-07 ENCOUNTER — Ambulatory Visit (INDEPENDENT_AMBULATORY_CARE_PROVIDER_SITE_OTHER): Payer: 59 | Admitting: Internal Medicine

## 2020-11-07 ENCOUNTER — Telehealth: Payer: Self-pay | Admitting: Gastroenterology

## 2020-11-07 ENCOUNTER — Other Ambulatory Visit: Payer: Self-pay

## 2020-11-07 ENCOUNTER — Encounter: Payer: Self-pay | Admitting: Internal Medicine

## 2020-11-07 ENCOUNTER — Other Ambulatory Visit: Payer: Self-pay | Admitting: Internal Medicine

## 2020-11-07 VITALS — BP 126/68 | HR 102 | Temp 97.5°F | Ht 70.0 in | Wt 143.0 lb

## 2020-11-07 DIAGNOSIS — E538 Deficiency of other specified B group vitamins: Secondary | ICD-10-CM

## 2020-11-07 DIAGNOSIS — D509 Iron deficiency anemia, unspecified: Secondary | ICD-10-CM

## 2020-11-07 DIAGNOSIS — K529 Noninfective gastroenteritis and colitis, unspecified: Secondary | ICD-10-CM | POA: Diagnosis not present

## 2020-11-07 DIAGNOSIS — R634 Abnormal weight loss: Secondary | ICD-10-CM | POA: Diagnosis not present

## 2020-11-07 DIAGNOSIS — K912 Postsurgical malabsorption, not elsewhere classified: Secondary | ICD-10-CM

## 2020-11-07 DIAGNOSIS — D508 Other iron deficiency anemias: Secondary | ICD-10-CM

## 2020-11-07 DIAGNOSIS — K922 Gastrointestinal hemorrhage, unspecified: Secondary | ICD-10-CM | POA: Diagnosis not present

## 2020-11-07 MED ORDER — OMEPRAZOLE 20 MG PO CPDR: 20.0000 mg | DELAYED_RELEASE_CAPSULE | Freq: Every day | ORAL | 11 refills | Status: DC

## 2020-11-07 MED ORDER — HYDROCORTISONE (PERIANAL) 2.5 % EX CREA: TOPICAL_CREAM | CUTANEOUS | 11 refills | Status: DC

## 2020-11-07 MED ORDER — CYANOCOBALAMIN 1000 MCG/ML IJ SOLN: INTRAMUSCULAR | 11 refills | Status: DC

## 2020-11-07 MED ORDER — LOPERAMIDE HCL 2 MG PO TABS: ORAL_TABLET | ORAL | 11 refills | Status: DC

## 2020-11-07 MED ORDER — NIFEREX PO TABS: ORAL_TABLET | ORAL | 11 refills | Status: DC

## 2020-11-07 NOTE — Telephone Encounter (Signed)
Received refill request stating that Niferex was not covered under patient's insurance.  Requesting alternative.   Routing to Dr. Gala Romney who last saw patient for further recommendations.

## 2020-11-07 NOTE — Patient Instructions (Signed)
Nutrition consultation for a short gut diet (complex carbohydrates, limited fat, oxalate, protein OK) - ASAP  Omeprazole 20 mg daily (disp 30 with 11 refills)  Vitamin b12 injection - 1 mg monthly; stop oral supplements)  Anusol hemorrhoid cream (disp 1 unit); apply to the anorectum 3x daily as needed - I year refill  Multivitamin with fat soluble vitamins (DEAK)  Imodium 2 mg tablets take one before meals and at bedtime (4x daily) disp 120 with 11 refiils  Niferex (iron) 150 caps take one twice daily(disp 60) with 11 refills  Chem 12, CBC today  OV here in 3 months  As discussed, will hold off on further evaluation of iron deficiency (including upper endoscopy), etc for now
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
## 2020-11-08 ENCOUNTER — Other Ambulatory Visit: Payer: Self-pay

## 2020-11-08 MED ORDER — FERROUS SULFATE 325 (65 FE) MG PO TBEC: 325.0000 mg | DELAYED_RELEASE_TABLET | Freq: Two times a day (BID) | ORAL | 1 refills | Status: DC

## 2020-11-08 NOTE — Telephone Encounter (Signed)
Communication noted.  Lets go with ferric sulfate 325 mg 1 tablet twice daily.  Dispense 60 with 1 refill.

## 2020-11-08 NOTE — Telephone Encounter (Signed)
Noted Ferrous sulfate 325 mf 1 tab bid #60 with 1 rf sent to pts pharmacy.

## 2020-11-10 ENCOUNTER — Other Ambulatory Visit: Payer: Self-pay

## 2020-11-10 ENCOUNTER — Encounter: Payer: Self-pay | Admitting: Family

## 2020-11-10 ENCOUNTER — Ambulatory Visit: Payer: 59 | Admitting: Family

## 2020-11-10 VITALS — BP 120/75 | HR 95 | Temp 98.3°F | Ht 70.0 in | Wt 141.0 lb

## 2020-11-10 DIAGNOSIS — K219 Gastro-esophageal reflux disease without esophagitis: Secondary | ICD-10-CM | POA: Diagnosis not present

## 2020-11-10 DIAGNOSIS — I6930 Unspecified sequelae of cerebral infarction: Secondary | ICD-10-CM

## 2020-11-10 DIAGNOSIS — D509 Iron deficiency anemia, unspecified: Secondary | ICD-10-CM | POA: Diagnosis not present

## 2020-11-10 DIAGNOSIS — E538 Deficiency of other specified B group vitamins: Secondary | ICD-10-CM

## 2020-11-10 DIAGNOSIS — I10 Essential (primary) hypertension: Secondary | ICD-10-CM

## 2020-11-10 DIAGNOSIS — E785 Hyperlipidemia, unspecified: Secondary | ICD-10-CM

## 2020-11-10 DIAGNOSIS — Z23 Encounter for immunization: Secondary | ICD-10-CM | POA: Diagnosis not present

## 2020-11-10 DIAGNOSIS — D649 Anemia, unspecified: Secondary | ICD-10-CM

## 2020-11-10 DIAGNOSIS — Z932 Ileostomy status: Secondary | ICD-10-CM

## 2020-11-10 MED ORDER — GABAPENTIN 300 MG PO CAPS: 300.0000 mg | ORAL_CAPSULE | Freq: Every day | ORAL | 3 refills | Status: DC

## 2020-11-10 MED ORDER — "BD ECLIPSE SYRINGE 25G X 1"" 3 ML MISC": 1.0000 [in_us] | 0 refills | Status: AC

## 2020-11-10 NOTE — Patient Instructions (Signed)
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
## 2020-11-11 ENCOUNTER — Other Ambulatory Visit: Payer: Self-pay

## 2020-11-11 ENCOUNTER — Observation Stay (HOSPITAL_COMMUNITY)
Admission: EM | Admit: 2020-11-11 | Discharge: 2020-11-12 | Disposition: A | Discharge status: Home / Self Care | Payer: 59 | Attending: Internal Medicine | Admitting: Internal Medicine

## 2020-11-11 ENCOUNTER — Telehealth: Payer: Self-pay | Admitting: Gastroenterology

## 2020-11-11 ENCOUNTER — Encounter (HOSPITAL_COMMUNITY): Payer: Self-pay | Admitting: Emergency Medicine

## 2020-11-11 DIAGNOSIS — D62 Acute posthemorrhagic anemia: Secondary | ICD-10-CM | POA: Diagnosis present

## 2020-11-11 DIAGNOSIS — D500 Iron deficiency anemia secondary to blood loss (chronic): Secondary | ICD-10-CM | POA: Diagnosis not present

## 2020-11-11 DIAGNOSIS — Z79899 Other long term (current) drug therapy: Secondary | ICD-10-CM | POA: Insufficient documentation

## 2020-11-11 DIAGNOSIS — G2581 Restless legs syndrome: Secondary | ICD-10-CM | POA: Insufficient documentation

## 2020-11-11 DIAGNOSIS — D519 Vitamin B12 deficiency anemia, unspecified: Secondary | ICD-10-CM | POA: Diagnosis not present

## 2020-11-11 DIAGNOSIS — E785 Hyperlipidemia, unspecified: Secondary | ICD-10-CM | POA: Insufficient documentation

## 2020-11-11 DIAGNOSIS — I10 Essential (primary) hypertension: Secondary | ICD-10-CM | POA: Insufficient documentation

## 2020-11-11 DIAGNOSIS — K909 Intestinal malabsorption, unspecified: Secondary | ICD-10-CM | POA: Diagnosis not present

## 2020-11-11 DIAGNOSIS — K2970 Gastritis, unspecified, without bleeding: Secondary | ICD-10-CM | POA: Insufficient documentation

## 2020-11-11 DIAGNOSIS — D649 Anemia, unspecified: Secondary | ICD-10-CM | POA: Diagnosis not present

## 2020-11-11 DIAGNOSIS — Z20822 Contact with and (suspected) exposure to covid-19: Secondary | ICD-10-CM | POA: Insufficient documentation

## 2020-11-11 DIAGNOSIS — Z8719 Personal history of other diseases of the digestive system: Secondary | ICD-10-CM | POA: Insufficient documentation

## 2020-11-11 DIAGNOSIS — R718 Other abnormality of red blood cells: Secondary | ICD-10-CM | POA: Diagnosis present

## 2020-11-11 HISTORY — DX: Anemia, unspecified: D64.9

## 2020-11-11 LAB — CBC WITH DIFFERENTIAL/PLATELET
Abs Immature Granulocytes: 0.05 10*3/uL (ref 0.00–0.07)
Basophils Absolute: 0 10*3/uL (ref 0.0–0.2)
Basophils Absolute: 0.1 10*3/uL (ref 0.0–0.1)
Basophils Relative: 1 %
Basos: 1 %
EOS (ABSOLUTE): 0.1 10*3/uL (ref 0.0–0.4)
Eos: 2 %
Eosinophils Absolute: 0.1 10*3/uL (ref 0.0–0.5)
Eosinophils Relative: 1 %
HCT: 19.5 % — ABNORMAL LOW (ref 39.0–52.0)
Hematocrit: 19.9 % — ABNORMAL LOW (ref 37.5–51.0)
Hemoglobin: 5.6 g/dL — CL (ref 13.0–17.7)
Hemoglobin: 5.5 g/dL — CL (ref 13.0–17.0)
Immature Grans (Abs): 0 10*3/uL (ref 0.0–0.1)
Immature Granulocytes: 0 %
Immature Granulocytes: 1 %
Lymphocytes Absolute: 2 10*3/uL (ref 0.7–3.1)
Lymphocytes Relative: 20 %
Lymphs Abs: 1.8 10*3/uL (ref 0.7–4.0)
Lymphs: 40 %
MCH: 22.6 pg — ABNORMAL LOW (ref 26.6–33.0)
MCH: 23.5 pg — ABNORMAL LOW (ref 26.0–34.0)
MCHC: 28.1 g/dL — ABNORMAL LOW (ref 31.5–35.7)
MCHC: 28.2 g/dL — ABNORMAL LOW (ref 30.0–36.0)
MCV: 80 fL (ref 79–97)
MCV: 83.3 fL (ref 80.0–100.0)
Monocytes Absolute: 0.7 10*3/uL (ref 0.1–0.9)
Monocytes Absolute: 0.9 10*3/uL (ref 0.1–1.0)
Monocytes Relative: 10 %
Monocytes: 13 %
Neutro Abs: 6.3 10*3/uL (ref 1.7–7.7)
Neutrophils Absolute: 2.3 10*3/uL (ref 1.4–7.0)
Neutrophils Relative %: 67 %
Neutrophils: 44 %
Platelets: 447 10*3/uL (ref 150–450)
Platelets: 457 10*3/uL — ABNORMAL HIGH (ref 150–400)
RBC: 2.48 x10E6/uL — CL (ref 4.14–5.80)
RBC: 2.34 MIL/uL — ABNORMAL LOW (ref 4.22–5.81)
RDW: 16.2 % — ABNORMAL HIGH (ref 11.6–15.4)
RDW: 18.1 % — ABNORMAL HIGH (ref 11.5–15.5)
WBC: 5.2 10*3/uL (ref 3.4–10.8)
WBC: 9.3 10*3/uL (ref 4.0–10.5)
nRBC: 0.3 % — ABNORMAL HIGH (ref 0.0–0.2)

## 2020-11-11 LAB — BASIC METABOLIC PANEL
Anion gap: 6 (ref 5–15)
BUN/Creatinine Ratio: 20 (ref 10–24)
BUN: 19 mg/dL (ref 8–27)
BUN: 21 mg/dL — ABNORMAL HIGH (ref 6–20)
CO2: 25 mmol/L (ref 20–29)
CO2: 26 mmol/L (ref 22–32)
Calcium: 9.2 mg/dL (ref 8.6–10.2)
Calcium: 9.3 mg/dL (ref 8.9–10.3)
Chloride: 105 mmol/L (ref 96–106)
Chloride: 106 mmol/L (ref 98–111)
Creatinine, Ser: 0.94 mg/dL (ref 0.76–1.27)
Creatinine, Ser: 1.05 mg/dL (ref 0.61–1.24)
GFR calc Af Amer: 101 mL/min/{1.73_m2} (ref >59)
GFR calc non Af Amer: 88 mL/min/{1.73_m2} (ref >59)
GFR, Estimated: >60 mL/min (ref >60)
Glucose, Bld: 105 mg/dL — ABNORMAL HIGH (ref 70–99)
Glucose: 97 mg/dL (ref 65–99)
Potassium: 4.1 mmol/L (ref 3.5–5.2)
Potassium: 3.7 mmol/L (ref 3.5–5.1)
Sodium: 141 mmol/L (ref 134–144)
Sodium: 138 mmol/L (ref 135–145)

## 2020-11-11 LAB — POC OCCULT BLOOD, ED: Fecal Occult Bld: POSITIVE — AB

## 2020-11-11 LAB — PREPARE RBC (CROSSMATCH)

## 2020-11-11 LAB — SARS CORONAVIRUS 2 BY RT PCR (HOSPITAL ORDER, PERFORMED IN ~~LOC~~ HOSPITAL LAB): SARS Coronavirus 2: NEGATIVE

## 2020-11-11 MED ORDER — PANTOPRAZOLE SODIUM 40 MG IV SOLR
40.0000 mg | Freq: Two times a day (BID) | INTRAVENOUS | Status: DC
  Administered 2020-11-11 – 2020-11-12 (×3): 40 mg via INTRAVENOUS
  Filled 2020-11-11 (×6): qty 40

## 2020-11-11 MED ORDER — SODIUM CHLORIDE 0.9 % IV SOLN
510.0000 mg | Freq: Once | INTRAVENOUS | Status: AC
  Administered 2020-11-12: 510 mg via INTRAVENOUS
  Filled 2020-11-11: qty 17

## 2020-11-11 MED ORDER — ROPINIROLE HCL 0.25 MG PO TABS
ORAL_TABLET | ORAL | Status: AC
  Filled 2020-11-11: qty 2

## 2020-11-11 MED ORDER — SODIUM CHLORIDE 0.9 % IV SOLN
10.0000 mL/h | Freq: Once | INTRAVENOUS | Status: AC
  Administered 2020-11-11: 10 mL/h via INTRAVENOUS

## 2020-11-11 MED ORDER — AMLODIPINE BESYLATE 5 MG PO TABS
5.0000 mg | ORAL_TABLET | Freq: Every day | ORAL | Status: DC
  Administered 2020-11-12: 5 mg via ORAL
  Filled 2020-11-11 (×2): qty 1

## 2020-11-11 MED ORDER — ROPINIROLE HCL 0.25 MG PO TABS
0.5000 mg | ORAL_TABLET | Freq: Every day | ORAL | Status: DC
  Administered 2020-11-11: 0.5 mg via ORAL
  Filled 2020-11-11 (×2): qty 1

## 2020-11-11 MED ORDER — GABAPENTIN 300 MG PO CAPS
300.0000 mg | ORAL_CAPSULE | Freq: Every day | ORAL | Status: DC
  Administered 2020-11-11: 300 mg via ORAL
  Filled 2020-11-11: qty 1

## 2020-11-11 MED ORDER — METHOCARBAMOL 500 MG PO TABS: 500.0000 mg | ORAL_TABLET | Freq: Three times a day (TID) | ORAL | Status: DC | PRN

## 2020-11-11 MED ORDER — ATORVASTATIN CALCIUM 10 MG PO TABS
10.0000 mg | ORAL_TABLET | Freq: Every day | ORAL | Status: DC
  Administered 2020-11-12: 10 mg via ORAL
  Filled 2020-11-11 (×2): qty 1
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
## 2020-11-11 NOTE — Telephone Encounter (Signed)
I called the patient around 8:00 this morning to give him notification of his hemoglobin of 5.6.  I spoke with him and his wife.  He is feeling okay, he actually was exercising when I reached him.  He indicates that his stools are less frequent than before (status post subtotal colectomy), which he attributes to dietary changes that he was advised to incorporate during his recent hospitalization.  His stools are dark, on iron.  Recommendations:  1.  I advised the patient to proceed to the emergency room if he develops significant symptoms such as weakness, dizziness, chest pain, or shortness of breath.  2.  Otherwise, he should check in with Dr. Gala Romney on Monday morning for further instruction.  Dr. Gala Romney should be able to get a copy of the labs from [facility], where it was drawn.   3.  Subsequent to my conversation with the patient, I called the [facility] health operator who provided me contact information for Dr. Abbey Chatters [phone]), who is on-call for [facility].  We spoke, and he indicated that he would probably be touching base with the patient himself.  He and I are not clear why the nighttime [facility] operator who called me last night was unable to identify him as being on-call for the practice so he is planning to try to look into that.  Cleotis Nipper, M.D. Pager [phone] If no answer or after 5 PM call [phone]
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
## 2020-11-11 NOTE — H&P (Signed)
History and Physical    [name] [id number] DOB: Mar 03, 1961 DOA: 11/11/2020  PCP: Sharion Balloon, FNP   Patient coming from: home  I have personally briefly reviewed patient's old medical records in [place]  Chief Complaint: Generalized weakness, fatigue and abnormal blood work.  HPI: [name] is a 60 y.o. male with medical history significant of hypertension, history of kidney stones, hyperlipidemia, B12 and iron deficiency due to malabsorption, short gut syndrome (in the setting of multiple bowel surgeries and resections) and chronic diarrhea; who presented to the hospital secondary to generalized weakness, fatigue, intermittent dizziness, looking more pale according to patient's wife and recent evaluation by his primary gastroenterologist.  Patient was also contacted after receiving results of abnormal hemoglobin level that was checked during his most recent visit to PCP office (hemoglobin 5.6). Patient denies chest pain, shortness of breath, abdominal pain, focal neurologic deficits, fever, chills, sick contacts or any other complaints.  Of note, patient reports dark-colored stools for the last 2 weeks or so, chronically receiving iron supplementation.  ED Course: Hemoglobin check and found to be 5.5; positive fecal occult blood test. Case discussed with gastroenterologist who recommended admission, transfusion to fully stabilize and endoscopy in the morning.  TRH has been called to place in the hospital for further evaluation and management.  Patient is hemodynamically stable.  Review of Systems: As per HPI otherwise all other systems reviewed and are negative.   Past Medical History:  Diagnosis Date  . History of kidney stones   . Hypertension   . Low testosterone   . Stroke [facility]) 2020    Past Surgical History:  Procedure Laterality Date  . APPLICATION OF WOUND VAC  07/29/2019   Procedure: Application Of Wound Vac;  Surgeon: Georganna Skeans, MD;   Location: [place];  Service: General;;  . APPLICATION OF WOUND VAC  07/31/2019   Procedure: Application Of Wound Vac;  Surgeon: Jesusita Oka, MD;  Location: [place];  Service: General;;  . BIOPSY  09/29/2020   Procedure: BIOPSY;  Surgeon: Rogene Houston, MD;  [facility];  Service: Endoscopy;;  . bone spur surgery Right   . BOWEL RESECTION N/A 07/28/2019   Procedure: Small Bowel Resection extended illeosintectomy;  Surgeon: Manuelito Boston, MD;  Location: [place];  Service: General;  Laterality: N/A;  . CLOSED REDUCTION WRIST FRACTURE Left 07/28/2019   Procedure: Closed Reduction Wrist;  Surgeon: Meredith Pel, MD;  Location: [place];  Service: Orthopedics;  Laterality: Left;  . COLOSTOMY REVERSAL N/A 05/23/2020   Procedure: END ILEOSTOMY REVERSAL;  Surgeon: Jesusita Oka, MD;  Location: [place];  Service: General;  Laterality: N/A;  . EXTRACORPOREAL SHOCK WAVE LITHOTRIPSY Left 04/16/2018   Procedure: LEFT EXTRACORPOREAL SHOCK WAVE LITHOTRIPSY (ESWL);  Surgeon: Cleon Gustin, MD;  Location: [facility];  Service: Urology;  Laterality: Left;  . EYE SURGERY    . FEMUR IM NAIL Right 07/28/2019   Procedure: INTRAMEDULLARY (IM) NAIL FEMORAL;  Surgeon: Meredith Pel, MD;  Location: [place];  Service: Orthopedics;  Laterality: Right;  . FLEXIBLE SIGMOIDOSCOPY N/A 02/01/2020   Procedure: FLEXIBLE SIGMOIDOSCOPY;  Surgeon: Jesusita Oka, MD;  Location: [facility];  Service: General;  Laterality: N/A;  . FLEXIBLE SIGMOIDOSCOPY N/A 09/29/2020   Procedure: FLEXIBLE SIGMOIDOSCOPY;  Surgeon: Rogene Houston, MD;  [facility];  Service: Endoscopy;  Laterality: N/A;  had very short colon due to surgery  . IRRIGATION AND DEBRIDEMENT KNEE Left 07/28/2019  Procedure: Irrigation And Debridement  Left Knee;  Surgeon: Meredith Pel, MD;  Location: [place];  Service: Orthopedics;  Laterality: Left;  . KNEE CLOSED REDUCTION Right 09/10/2019   Procedure: CLOSED MANIPULATION KNEE;   Surgeon: Shona Needles, MD;  Location: [place];  Service: Orthopedics;  Laterality: Right;  . LAPAROTOMY N/A 07/29/2019   Procedure: EXPLORATORY LAPAROTOMY, ileocecectomy;  Surgeon: Georganna Skeans, MD;  Location: [place];  Service: General;  Laterality: N/A;  . LAPAROTOMY N/A 07/28/2019   Procedure: EXPLORATORY LAPAROTOMY WITH MESENTERIC REPAIR;  Surgeon: Tasha Boston, MD;  Location: [place];  Service: General;  Laterality: N/A;  . LAPAROTOMY N/A 07/31/2019   Procedure: RE-EXPLORATORY LAPAROTOMY, RESECTION OF TRANSVERSE, LEFT, AND SIGMOID COLON, CREATION OF ILEOSTOMY,  PRIMARY FASCIAL CLOSURE;  Surgeon: Jesusita Oka, MD;  Location: [place];  Service: General;  Laterality: N/A;  . LAPAROTOMY N/A 05/23/2020   Procedure: EXPLORATORY LAPAROTOMY;  Surgeon: Jesusita Oka, MD;  Location: [place];  Service: General;  Laterality: N/A;  . LITHOTRIPSY    . ORIF ANKLE FRACTURE Right 08/04/2019   Procedure: Open Reduction Internal Fixation (Orif) Ankle Fracture;  Surgeon: Shona Needles, MD;  Location: [place];  Service: Orthopedics;  Laterality: Right;  . ORIF CLAVICULAR FRACTURE Left 08/04/2019   Procedure: OPEN REDUCTION INTERNAL FIXATION (ORIF) CLAVICULAR FRACTURE;  Surgeon: Shona Needles, MD;  Location: [place];  Service: Orthopedics;  Laterality: Left;  . ORIF WRIST FRACTURE Left 08/04/2019   Procedure: OPEN REDUCTION INTERNAL FIXATION (ORIF) WRIST FRACTURE;  Surgeon: Shona Needles, MD;  Location: [place];  Service: Orthopedics;  Laterality: Left;    Social History  reports that he has never smoked. He has never used smokeless tobacco. He reports that he does not drink alcohol and does not use drugs.  No Known Allergies  Family History  Problem Relation Age of Onset  . Hypertension Mother   . Cancer Father        prostate  . Hypertension Father     Prior to Admission medications   Medication Sig Start Date End Date Taking? Authorizing Provider  amLODipine (NORVASC) 5 MG tablet Take 1 tablet  (5 mg total) by mouth daily. 10/23/20   Evelina Dun A, FNP  aspirin EC 81 MG tablet Take 1 tablet (81 mg total) by mouth daily. Swallow whole. 10/02/20   Johnson, Clanford L, MD  atorvastatin (LIPITOR) 10 MG tablet TAKE 1 TABLET BY MOUTH EVERY DAY 10/26/20   Evelina Dun A, FNP  cyanocobalamin (,VITAMIN B-12,) 1000 MCG/ML injection 1 injection once monthly 11/07/20   Rourk, Cristopher Estimable, MD  Cyanocobalamin (VITAMIN B-12) 2500 MCG TABS Take 2,500 mcg by mouth daily. 09/30/20   Johnson, Clanford L, MD  ferrous sulfate 325 (65 FE) MG EC tablet Take 1 tablet (325 mg total) by mouth 2 (two) times daily with a meal. 11/08/20   Rourk, Cristopher Estimable, MD  gabapentin (NEURONTIN) 300 MG capsule Take 1 capsule (300 mg total) by mouth at bedtime. 11/10/20   Sharion Balloon, FNP  loperamide (IMODIUM) 2 MG capsule Take 1 capsule (2 mg total) by mouth 4 (four) times daily -  before meals and at bedtime. 09/29/20 11/28/20  Murlean Iba, MD  methocarbamol (ROBAXIN) 500 MG tablet Take 500 mg by mouth 2 (two) times daily.    [provider]  Multiple Vitamin (MULTIVITAMIN WITH MINERALS) TABS tablet Take 1 tablet by mouth daily.    [provider]  rOPINIRole (REQUIP) 0.5 MG tablet Take  1 tablet (0.5 mg total) by mouth at bedtime. 09/27/20   McCue, Janett Billow, NP  SYRINGE-NEEDLE, DISP, 3 ML (BD ECLIPSE SYRINGE) 25G X 1" 3 ML MISC 1 inch by Does not apply route every 30 (thirty) days. Whatever insurance covers for B12 injections 11/10/20   Sharion Balloon, FNP    Physical Exam: Vitals:   11/11/20 1000 11/11/20 1030 11/11/20 1100 11/11/20 1140  BP: 119/80 114/83 121/80 108/78  Pulse: 92 94 (!) 131 90  Resp: 14 16 (!) 27 14  Temp:    98.6 F (37 C)  TempSrc:    Oral  SpO2: 100% 100% 99%   Weight:      Height:        Constitutional: NAD, calm, comfortable and hemodynamically stable.  Patient was found to be pale on examination. Vitals:   11/11/20 1000 11/11/20 1030 11/11/20 1100 11/11/20 1140  BP:  119/80 114/83 121/80 108/78  Pulse: 92 94 (!) 131 90  Resp: 14 16 (!) 27 14  Temp:    98.6 F (37 C)  TempSrc:    Oral  SpO2: 100% 100% 99%   Weight:      Height:       Eyes: PERRL, lids and conjunctivae normal; no icterus, no nystagmus. ENMT: Mucous membranes are moist. Posterior pharynx clear of any exudate or lesions. Neck: normal, supple, no masses, no thyromegaly; no JVD. Respiratory: clear to auscultation bilaterally, no wheezing, no crackles. Normal respiratory effort. No accessory muscle use.  Cardiovascular: Regular rate and rhythm, no murmurs / rubs / gallops. No extremity edema. 2+ pedal pulses. No carotid bruits.  Abdomen: no tenderness, no masses palpated. No hepatosplenomegaly. Bowel sounds positive.  Musculoskeletal: no clubbing / cyanosis. No joint deformity upper and lower extremities. Good ROM, no contractures. Normal muscle tone.  Skin: no petechiae. Neurologic: CN 2-12 grossly intact. Sensation intact, no focal deficit. Psychiatric: Normal judgment and insight. Alert and oriented x 3. Normal mood.     Labs on Admission: I have personally reviewed following labs and imaging studies  CBC: Recent Labs  Lab 11/10/20 1429 11/11/20 0950  WBC 5.2 9.3  NEUTROABS 2.3 6.3  HGB 5.6* 5.5*  HCT 19.9* 19.5*  MCV 80 83.3  PLT 447 457*    Basic Metabolic Panel: Recent Labs  Lab 11/10/20 1431 11/11/20 0950  NA 141 138  K 4.1 3.7  CL 105 106  CO2 25 26  GLUCOSE 97 105*  BUN 19 21*  CREATININE 0.94 1.05  CALCIUM 9.2 9.3    GFR: Estimated Creatinine Clearance: 67.7 mL/min (by C-G formula based on SCr of 1.05 mg/dL).  Liver Function Tests: No results for input(s): AST, ALT, ALKPHOS, BILITOT, PROT, ALBUMIN in the last 168 hours.  Urine analysis:    Component Value Date/Time   COLORURINE YELLOW 11/05/2019 0029   APPEARANCEUR CLOUDY (A) 11/05/2019 0029   LABSPEC 1.017 11/05/2019 0029   PHURINE 5.0 11/05/2019 0029   GLUCOSEU NEGATIVE 11/05/2019 0029    HGBUR LARGE (A) 11/05/2019 0029   BILIRUBINUR NEGATIVE 11/05/2019 0029   BILIRUBINUR negative 09/20/2013 [place] 11/05/2019 0029   PROTEINUR 30 (A) 11/05/2019 0029   UROBILINOGEN negative 09/20/2013 1702   NITRITE NEGATIVE 11/05/2019 0029   LEUKOCYTESUR NEGATIVE 11/05/2019 0029    Radiological Exams on Admission: No results found.  Assessment/Plan 1-Acute blood lossanemia  -Positive fecal occult blood test -Prior history of blood loss anemia along with iron and B12 deficiency -will transfuse 2 units of PRBC's -follow Hgb  trend -started on IV PPI Q12h -avoid NSAID's and heparin products -GI consulted and will plan for EGD in am; NPO after midnight  -will give IV iron  2-HTN -continue norvasc  3-HLD -continue lipitor  4-RLS: -continue requip  Short gut syndrome -continue lifestyle changes -continue PRN loperamide -continue B12 supplementation    DVT prophylaxis: SCd's Code Status:   Full Family Communication:  Wife at bedside  Disposition Plan:   Patient is from:  home  Anticipated DC to:  home  Anticipated DC date:  11/12/20  Anticipated DC barriers: Stabilization of his Hgb level  Consults called:  Gi service  Admission status:  Observation, LOW < 2 midnights, med-surg bed.  Severity of Illness: Mild illness, admitted for blood transfusion and EGD evaluation. Patient with acute blood loss anemia in the setting of anemia of chronic disease and malabsorption.  Barton Dubois MD [facility]  How to contact the [facility] Attending or Consulting provider [place] or covering provider during after hours [place], for this patient?   1. Check the care team in [facility] and look for a) attending/consulting [facility] provider listed and b) the [facility] team listed 2. Log into [email or web address] and use [place]'s universal password to access. If you do not have the password, please contact the hospital operator. 3. Locate the [facility] provider you are looking for under [facility] and page to a number that you can be directly reached. 4. If you still have difficulty reaching the provider, please page the [facility] (Director on Call) for the Hospitalists listed on amion for assistance.  11/11/2020, 11:52 AM

## 2020-11-11 NOTE — Telephone Encounter (Signed)
I am on call for [facility] unassigned patients tonight and received a call from the [place] [phone], who indicated that she had received a critical lab notification from [place] on this patient for Dr. Gala Romney, but apparently his practice has no one who could be identified as covering this evening so she paged me.  I explained that I am not covering Dr. Roseanne Kaufman practice but apparently this unavailability has been going on all week at night and the operator has the administrator on call looking into it.  In the spirit of trying to facilitate patient care, I agreed to try to address this lab result and was connected to [place] at [employer], who indicated this patient (whose dob and phone # match what I was given) has a Hgb of 5.6.  Review of record shows that pt was hospitalized w/ similar hgb about 6 wks ago and was transfused, w/ hgb of 8.1 at time of dischg.  He was seen by Dr. Gala Romney 4 days ago and was seen yesterday at [place] w/ labs pending and nothing in either note indicates patient was in distress.  Plan:   1. I view of the late hour (approximately 2 a.m.) and apparent lack of patient acuity based on recent office encounters, I will attempt to reach the patient later this morning to notify him of this result and determine whether he needs to go to ER  2. Later today I will also attempt to reach administrator on call or other person of authority to resolve the circumstance of apparent inability to find someone designated to handle these calls.  Cleotis Nipper, M.D. Pager [phone] If no answer or after 5 PM call [phone]

## 2020-11-11 NOTE — ED Notes (Signed)
CRITICAL VALUE ALERT  Critical Value:  Hgb 5.5  Date & Time Notied:  11/11/2020 1028  Provider Notified: Dr. Rogene Houston  Orders Received/Actions taken: see chart
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
## 2020-11-12 ENCOUNTER — Telehealth: Payer: Self-pay | Admitting: Internal Medicine

## 2020-11-12 ENCOUNTER — Observation Stay (HOSPITAL_COMMUNITY): Payer: 59 | Admitting: Anesthesiology

## 2020-11-12 ENCOUNTER — Encounter (HOSPITAL_COMMUNITY)
Admission: EM | Disposition: A | Discharge status: Home / Self Care | Payer: Self-pay | Source: Home / Self Care | Attending: Emergency Medicine

## 2020-11-12 DIAGNOSIS — K2970 Gastritis, unspecified, without bleeding: Secondary | ICD-10-CM | POA: Diagnosis not present

## 2020-11-12 DIAGNOSIS — D649 Anemia, unspecified: Secondary | ICD-10-CM | POA: Diagnosis not present

## 2020-11-12 DIAGNOSIS — D62 Acute posthemorrhagic anemia: Secondary | ICD-10-CM | POA: Diagnosis not present

## 2020-11-12 HISTORY — PX: ESOPHAGOGASTRODUODENOSCOPY (EGD) WITH PROPOFOL: SHX5813

## 2020-11-12 HISTORY — PX: BIOPSY: SHX5522

## 2020-11-12 LAB — BASIC METABOLIC PANEL
Anion gap: 6 (ref 5–15)
BUN: 20 mg/dL (ref 6–20)
CO2: 26 mmol/L (ref 22–32)
Calcium: 9 mg/dL (ref 8.9–10.3)
Chloride: 104 mmol/L (ref 98–111)
Creatinine, Ser: 0.9 mg/dL (ref 0.61–1.24)
GFR, Estimated: >60 mL/min (ref >60)
Glucose, Bld: 91 mg/dL (ref 70–99)
Potassium: 3.5 mmol/L (ref 3.5–5.1)
Sodium: 136 mmol/L (ref 135–145)

## 2020-11-12 LAB — CBC
HCT: 27.8 % — ABNORMAL LOW (ref 39.0–52.0)
Hemoglobin: 8.5 g/dL — ABNORMAL LOW (ref 13.0–17.0)
MCH: 25.7 pg — ABNORMAL LOW (ref 26.0–34.0)
MCHC: 30.6 g/dL (ref 30.0–36.0)
MCV: 84 fL (ref 80.0–100.0)
Platelets: 383 10*3/uL (ref 150–400)
RBC: 3.31 MIL/uL — ABNORMAL LOW (ref 4.22–5.81)
RDW: 17 % — ABNORMAL HIGH (ref 11.5–15.5)
WBC: 7.6 10*3/uL (ref 4.0–10.5)
nRBC: 0.3 % — ABNORMAL HIGH (ref 0.0–0.2)

## 2020-11-12 LAB — TYPE AND SCREEN
ABO/RH(D): A POS
Antibody Screen: NEGATIVE
Unit division: 0
Unit division: 0

## 2020-11-12 LAB — BPAM RBC
Blood Product Expiration Date: 202202212359
Blood Product Expiration Date: 202203062359
ISSUE DATE / TIME: 202202051132
ISSUE DATE / TIME: 202202051426
Unit Type and Rh: 6200
Unit Type and Rh: 6200

## 2020-11-12 SURGERY — ESOPHAGOGASTRODUODENOSCOPY (EGD) WITH PROPOFOL
Anesthesia: General

## 2020-11-12 MED ORDER — PROPOFOL 10 MG/ML IV BOLUS
INTRAVENOUS | Status: AC
  Filled 2020-11-12: qty 40

## 2020-11-12 MED ORDER — LACTATED RINGERS IV SOLN: INTRAVENOUS | Status: DC | PRN

## 2020-11-12 MED ORDER — PROPOFOL 10 MG/ML IV BOLUS
INTRAVENOUS | Status: DC | PRN
Start: 2020-11-12 — End: 2020-11-12
  Administered 2020-11-12: 20 mg via INTRAVENOUS

## 2020-11-12 MED ORDER — LIDOCAINE HCL (PF) 2 % IJ SOLN
INTRAMUSCULAR | Status: AC
  Filled 2020-11-12: qty 5

## 2020-11-12 MED ORDER — FENTANYL CITRATE (PF) 100 MCG/2ML IJ SOLN
INTRAMUSCULAR | Status: DC | PRN
  Administered 2020-11-12: 50 ug via INTRAVENOUS

## 2020-11-12 MED ORDER — NIFEREX PO TABS: 1.0000 | ORAL_TABLET | Freq: Two times a day (BID) | ORAL | 3 refills | Status: DC

## 2020-11-12 MED ORDER — PROPOFOL 500 MG/50ML IV EMUL
INTRAVENOUS | Status: DC | PRN
  Administered 2020-11-12: 200 ug/kg/min via INTRAVENOUS

## 2020-11-12 MED ORDER — FENTANYL CITRATE (PF) 100 MCG/2ML IJ SOLN
INTRAMUSCULAR | Status: AC
  Filled 2020-11-12: qty 2

## 2020-11-12 MED ORDER — LIDOCAINE VISCOUS HCL 2 % MT SOLN
OROMUCOSAL | Status: AC
  Filled 2020-11-12: qty 15

## 2020-11-12 MED ORDER — STERILE WATER FOR IRRIGATION IR SOLN
Status: DC | PRN
  Administered 2020-11-12: 200 mL

## 2020-11-12 MED ORDER — PANTOPRAZOLE SODIUM 40 MG PO TBEC
40.0000 mg | DELAYED_RELEASE_TABLET | Freq: Two times a day (BID) | ORAL | Status: DC
  Filled 2020-11-12 (×3): qty 1

## 2020-11-12 MED ORDER — PANTOPRAZOLE SODIUM 40 MG PO TBEC: 40.0000 mg | DELAYED_RELEASE_TABLET | Freq: Two times a day (BID) | ORAL | 2 refills | Status: DC

## 2020-11-12 MED ORDER — SODIUM CHLORIDE 0.9 % IV SOLN: INTRAVENOUS | Status: DC

## 2020-11-12 MED ORDER — LIDOCAINE VISCOUS HCL 2 % MT SOLN
OROMUCOSAL | Status: DC | PRN
  Administered 2020-11-12: 1 via OROMUCOSAL

## 2020-11-12 MED ORDER — ASPIRIN EC 81 MG PO TBEC: 81.0000 mg | DELAYED_RELEASE_TABLET | Freq: Every day | ORAL | Status: DC

## 2020-11-12 NOTE — Op Note (Signed)
[facility] Patient Name: [name] Procedure Date: 11/12/2020 8:19 AM MRN: [id number] Date of Birth: 05-Mar-1961 Attending MD: Elon Alas. Abbey Chatters DO CSN: [id number] Age: 60 Admit Type: Outpatient Procedure:                Upper GI endoscopy Indications:              Iron deficiency anemia Providers:                Elon Alas. Abbey Chatters, DO, Lurline Del, RN, Wynonia Musty Tech, Technician Referring MD:              Medicines:                See the Anesthesia note for documentation of the                            administered medications Complications:            No immediate complications. Estimated Blood Loss:     Estimated blood loss was minimal. Procedure:                Pre-Anesthesia Assessment:                           - The anesthesia plan was to use monitored                            anesthesia care (MAC).                           After obtaining informed consent, the endoscope was                            passed under direct vision. Throughout the                            procedure, the patient's blood pressure, pulse, and                            oxygen saturations were monitored continuously. The                            [id number]-H190 ([id number]) scope was introduced through the                            mouth, and advanced to the second part of duodenum.                            The upper GI endoscopy was accomplished without                            difficulty. The patient tolerated the procedure                            well. Scope In: 9:18:39  AM Scope Out: 9:22:47 AM Total Procedure Duration: 0 hours 4 minutes 8 seconds  Findings:      There is no endoscopic evidence of bleeding, areas of erosion,       esophagitis, hiatal hernia, inflammation, ulcerations or varices in the       entire esophagus.      Localized moderate inflammation characterized by erosions and erythema       was found in the gastric antrum. Biopsies  were taken with a cold forceps       for Helicobacter pylori testing.      The duodenal bulb, first portion of the duodenum and second portion of       the duodenum were normal. Impression:               - Gastritis. Biopsied.                           - Normal duodenal bulb, first portion of the                            duodenum and second portion of the duodenum. Moderate Sedation:      Per Anesthesia Care Recommendation:           - Return patient to hospital ward for ongoing care.                           - Advance diet as tolerated.                           - Use Prilosec (omeprazole) 20 mg PO BID for 8                            weeks.                           - No ibuprofen, naproxen, or other non-steroidal                            anti-inflammatory drugs.                           - Okay to discharge patient from a GI standpoint.                            We will arrange repeat CBC in 1 week. I will also                            arrange capsule endoscopy in the outpatient setting. Procedure Code(s):        --- Professional ---                           [phone], Esophagogastroduodenoscopy, flexible,                            transoral; with biopsy, single or multiple Diagnosis Code(s):        --- Professional ---  K29.70, Gastritis, unspecified, without bleeding                           D50.9, Iron deficiency anemia, unspecified CPT copyright 2019 American Medical Association. All rights reserved. The codes documented in this report are preliminary and upon coder review may  be revised to meet current compliance requirements. Elon Alas. Abbey Chatters, DO [place], DO 11/12/2020 10:00:08 AM This report has been signed electronically. Number of Addenda: 0

## 2020-11-12 NOTE — OR Nursing (Signed)
Rise and fall of chest noted. Regular respirations, unlabored. Will continue to monitor.

## 2020-11-12 NOTE — Interval H&P Note (Signed)
History and Physical Interval Note:  11/12/2020 9:06 AM  [name]  has presented today for surgery, with the diagnosis of Anemia.  The various methods of treatment have been discussed with the patient and family. After consideration of risks, benefits and other options for treatment, the patient has consented to  Procedure(s): ESOPHAGOGASTRODUODENOSCOPY (EGD) WITH PROPOFOL (N/A) as a surgical intervention.  The patient's history has been reviewed, patient examined, no change in status, stable for surgery.  I have reviewed the patient's chart and labs.  Questions were answered to the patient's satisfaction.     Eloise Harman

## 2020-11-12 NOTE — Telephone Encounter (Signed)
Just completed EGD on this gentleman which showed gastritis with erosions.  I took biopsies for H. pylori which I will follow up on.  Duodenum normal and no active bleeding identified.  He is being discharged home today.  I am going to start him on omeprazole 20 mg twice daily for the next 8 weeks.  Can we set him up for repeat CBC in 1 week?    I think the next step for completeness would be a capsule endoscopy in the outpatient setting, but I will leave that up to Dr. Gala Romney as he is patient's primary GI and knows him well. Thanks!

## 2020-11-12 NOTE — OR Nursing (Signed)
VSS, pt denies any further needs at this time.

## 2020-11-12 NOTE — Progress Notes (Signed)
PT verbalized understanding of d/c instructions and wife present at bedside and also acknowledged understanding of d/c instructions. PT left via wheelchair at discharge with no acute distress noted and will follow up with GI and primary care as scheduled.
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
## 2020-11-12 NOTE — Progress Notes (Signed)
PT tolerated soft diet well. Hospitalist made aware.

## 2020-11-12 NOTE — OR Nursing (Signed)
Pt ambulated to restroom with standby assistance. Consent signed for procedure by patient. Patient denies any further needs at this time.

## 2020-11-12 NOTE — Transfer of Care (Signed)
Immediate Anesthesia Transfer of Care Note  Patient: [name] [facility]  Procedure(s) Performed: ESOPHAGOGASTRODUODENOSCOPY (EGD) WITH PROPOFOL (N/A ) BIOPSY  Patient Location: PACU  Anesthesia Type:General  Level of Consciousness: awake, alert , oriented and sedated  Airway & Oxygen Therapy: Patient Spontanous Breathing and Patient connected to nasal cannula oxygen  Post-op Assessment: Report given to RN and Post -op Vital signs reviewed and stable  Post vital signs: Reviewed and stable  Last Vitals:  Vitals Value Taken Time  BP 106/74 11/12/20 0935  Temp 97.6 11/12/20 0938  Pulse 76 11/12/20 0937  Resp 11 11/12/20 0937  SpO2 100 % 11/12/20 0937  Vitals shown include unvalidated device data.  Last Pain:  Vitals:   11/12/20 0900  TempSrc: Oral  PainSc: 0-No pain      Patients Stated Pain Goal: 8 (39/03/00 9233)  Complications: No complications documented.

## 2020-11-12 NOTE — Anesthesia Postprocedure Evaluation (Signed)
Anesthesia Post Note  Patient: [name]  Procedure(s) Performed: ESOPHAGOGASTRODUODENOSCOPY (EGD) WITH PROPOFOL (N/A ) BIOPSY  Patient location during evaluation: Nursing Unit Anesthesia Type: General Level of consciousness: awake and alert and oriented Pain management: pain level controlled Vital Signs Assessment: post-procedure vital signs reviewed and stable Respiratory status: spontaneous breathing and respiratory function stable Cardiovascular status: blood pressure returned to baseline and stable Postop Assessment: no apparent nausea or vomiting Anesthetic complications: no   No complications documented.   Last Vitals:  Vitals:   11/12/20 0934 11/12/20 0946  BP: 106/74 114/75  Pulse:  75  Resp: 15 12  Temp: 36.4 C   SpO2: 100% 98%    Last Pain:  Vitals:   11/12/20 0934  TempSrc:   PainSc: 0-No pain                 Cleona Doubleday C Jermesha Sottile

## 2020-11-12 NOTE — Discharge Summary (Signed)
Physician Discharge Summary  [name] [facility] YNW:[id number] DOB: [birth year] DOA: 11/11/2020  PCP: Sharion Balloon, FNP  Admit date: 11/11/2020 Discharge date: 11/12/2020  Time spent: 35 minutes  Recommendations for Outpatient Follow-up:  1. Repeat CBC to follow hemoglobin trend 2. 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  Discharged home with instruction to follow-up with PCP and gastroenterology service as an outpatient.  CODE STATUS: Full code.  Diet recommendation: Heart healthy diet.  Filed Weights   11/11/20 0941  Weight: 64 kg    History of present illness:  BERK PILOT is a 60 y.o. male with medical history significant of hypertension, history of kidney stones, hyperlipidemia, B12 and iron deficiency due to malabsorption, short gut syndrome (in the setting of multiple bowel surgeries and resections) and chronic diarrhea; who presented to the hospital secondary to generalized weakness, fatigue, intermittent dizziness, looking more pale according to patient's wife and recent evaluation by his primary gastroenterologist.  Patient was also contacted after receiving results of abnormal hemoglobin level that was checked during his most recent visit to PCP office (hemoglobin 5.6). Patient denies chest pain, shortness of breath, abdominal pain, focal neurologic deficits, fever, chills, sick contacts or any other complaints.  Of note, patient reports dark-colored stools for the last 2 weeks or so, chronically receiving iron supplementation.  ED Course: Hemoglobin check and found to be 5.5; positive fecal occult blood test. Case discussed with gastroenterologist who recommended admission, transfusion to fully stabilize and  endoscopy in the morning.  TRH has been called to place in the hospital for further evaluation and management.  Patient is hemodynamically stable.  Hospital Course:  1-Acute blood lossanemia  -Prior history of blood loss anemia along with iron and B12 deficiency -2 units of PRBCs transfused during hospitalization. -avoid NSAID's and maintain adequate hydration. -GI consulted and endoscopy performed; gastritis and erosions appreciated.  No active bleeding. -Will discharge on PPI twice a day and GI follow up as an outpatient for capsule endoscopy to complete evaluation.   -At discharge hemoglobin 8.5.  2-HTN -continue norvasc -Heart healthy diet has been encouraged.  3-HLD -continue lipitor  4-RLS: -continue requip and robaxin -Anticipate further improvement in symptoms once iron storage repleted and anemia improved.  5-Short gut syndrome -continue lifestyle changes -continue PRN loperamide -continue B12 supplementation  -IV Iron given during this hospitalization to assist with supplementation -Patient has been discharged on Niferex BID.  Procedures: Endoscopy: Positive for gastritis and a stomach erosions.  No active bleeding identified.  Biopsies were taken to rule out H. pylori and GI service will follow on the results..  Consultations:  Gastroenterology service  Discharge Exam: Vitals:   11/12/20 1109 11/12/20 1114  BP: 109/82 109/73  Pulse: 74 70  Resp: 16 16  Temp:    SpO2: 100% 97%    General: Afebrile, no chest pain, no nausea, no vomiting.  Reports no overt bleeding since admission.  After transfusion feeling much better, denying dizziness, palpitations, extreme fatigue or any other complaints.  Patient instructed to go home. Cardiovascular: S1 and S2, no rubs, no gallops, no JVD. Respiratory: Clear to movement bilaterally; no using accessory muscles.  No requiring oxygen supplementation. Abdomen: Soft, nontender, positive bowel sounds, no  distention. Extremities: No cyanosis or clubbing.  Discharge Instructions  Discharge Instructions    Diet - low sodium heart healthy   Complete by: As directed    Discharge instructions   Complete by: As directed    Maintain adequate hydration Follow up with PCP in 10 days Follow up with GI as instructed (office will contact you with appointment details) Avoid the use of NSAID's as discussed and hold aspirin for 10 days.     Allergies as of 11/12/2020   No Known Allergies     Medication List    STOP taking these medications   ferrous sulfate 325 (65 FE) MG EC tablet   NYQUIL PO     TAKE these medications   amLODipine 5 MG tablet Commonly known as: NORVASC Take 1 tablet (5 mg total) by mouth daily.   aspirin EC 81 MG tablet Take 1 tablet (81 mg total) by mouth daily. Hole for 10 days Start taking on: November 21, 2020 What changed:   additional instructions  These instructions start on November 21, 2020. If you are unsure what to do until then, ask your doctor or other care provider.   atorvastatin 10 MG tablet Commonly known as: LIPITOR TAKE 1 TABLET BY MOUTH EVERY DAY   BD Eclipse Syringe 25G X 1" 3 ML Misc Generic drug: SYRINGE-NEEDLE (DISP) 3 ML 1 inch by Does not apply route every 30 (thirty) days. Whatever insurance covers for B12 injections   Cyanocobalamin 2500 MCG Tabs Take 2,500 mcg by mouth daily.   cyanocobalamin 1000 MCG/ML injection Commonly known as: (VITAMIN B-12) 1 injection once monthly   diphenhydramine-acetaminophen 25-500 MG Tabs tablet Commonly known as: TYLENOL PM Take 1 tablet by mouth at bedtime as needed.   gabapentin 300 MG capsule Commonly known as: NEURONTIN Take 1 capsule (300 mg total) by mouth at bedtime.   loperamide 2 MG capsule Commonly known as: IMODIUM Take 1 capsule (2 mg total) by mouth 4 (four) times daily -  before meals and at bedtime.   methocarbamol 500 MG tablet Commonly known as: ROBAXIN Take 500 mg by  mouth 2 (two) times daily.   multivitamin with minerals Tabs tablet Take 1 tablet by mouth daily.   Niferex Tabs Take 1 tablet by mouth in the morning and at bedtime.   pantoprazole 40 MG tablet Commonly known as: PROTONIX Take 1 tablet (40 mg total) by mouth 2 (two) times daily.   rOPINIRole 0.5 MG tablet Commonly known as: Requip Take 1 tablet (0.5 mg total) by mouth at bedtime.      No Known Allergies  Follow-up Information    Sharion Balloon, FNP. Schedule an appointment as soon as possible for a visit in 10 day(s).   Specialty: Family Medicine Contact information: [place] [address] [phone]               The results of significant diagnostics from this hospitalization (including imaging, microbiology, ancillary and laboratory) are listed below for reference.    Significant Diagnostic Studies: No results found.  Microbiology: Recent Results (from the past 240 hour(s))  SARS Coronavirus 2 by RT PCR (hospital order, performed in [facility] hospital lab) Nasopharyngeal Nasopharyngeal Swab     Status: None   Collection Time: 11/11/20 10:51 AM   Specimen: Nasopharyngeal Swab  Result Value Ref Range Status   SARS Coronavirus 2 NEGATIVE NEGATIVE Final    Comment: (NOTE) SARS-CoV-2 target nucleic acids are NOT DETECTED.  The SARS-CoV-2 RNA is generally detectable in upper and lower respiratory specimens during  the acute phase of infection. The lowest concentration of SARS-CoV-2 viral copies this assay can detect is 250 copies / mL. A negative result does not preclude SARS-CoV-2 infection and should not be used as the sole basis for treatment or other patient management decisions.  A negative result may occur with improper specimen collection / handling, submission of specimen other than nasopharyngeal swab, presence of viral mutation(s) within the areas targeted by this assay, and inadequate number of viral copies (<250 copies / mL).  A negative result must be combined with clinical observations, patient history, and epidemiological information.  Fact Sheet for Patients:   StrictlyIdeas.no  Fact Sheet for Healthcare Providers: BankingDealers.co.za  This test is not yet approved or  cleared by the [place] FDA and has been authorized for detection and/or diagnosis of SARS-CoV-2 by FDA under an Emergency Use Authorization (EUA).  This EUA will remain in effect (meaning this test can be used) for the duration of the COVID-19 declaration under Section 564(b)(1) of the Act, 21 U.S.C. section 360bbb-3(b)(1), unless the authorization is terminated or revoked sooner.  Performed at [facility], [address] [place], [place] [address]      Labs: Basic Metabolic Panel: Recent Labs  Lab 11/10/20 1431 11/11/20 0950 11/12/20 0420  NA 141 138 136  K 4.1 3.7 3.5  CL 105 106 104  CO2 25 26 26   GLUCOSE 97 105* 91  BUN 19 21* 20  CREATININE 0.94 1.05 0.90  CALCIUM 9.2 9.3 9.0   CBC: Recent Labs  Lab 11/10/20 1429 11/11/20 0950 11/12/20 0420  WBC 5.2 9.3 7.6  NEUTROABS 2.3 6.3  --   HGB 5.6* 5.5* 8.5*  HCT 19.9* 19.5* 27.8*  MCV 80 83.3 84.0  PLT 447 457* 383    Signed:  Barton Dubois MD.  [facility] 11/12/2020, 1:51 PM

## 2020-11-12 NOTE — Progress Notes (Signed)
PT alert and oriented this am with wife present at bedside denies any pain. PT taken to Endo procedure at this time and verifies he has been NPO since before midnight.

## 2020-11-13 ENCOUNTER — Telehealth: Payer: Self-pay

## 2020-11-13 NOTE — Progress Notes (Signed)
Pt completed labs Friday at [place]. Waiting on results.

## 2020-11-13 NOTE — Telephone Encounter (Signed)
Spoke with patient, appointment scheduled 11/23/20 at 3:55 pm with [name]

## 2020-11-14 ENCOUNTER — Other Ambulatory Visit: Payer: Self-pay | Admitting: Family

## 2020-11-14 DIAGNOSIS — I10 Essential (primary) hypertension: Secondary | ICD-10-CM

## 2020-11-14 LAB — SURGICAL PATHOLOGY

## 2020-11-15 ENCOUNTER — Encounter (HOSPITAL_COMMUNITY): Payer: Self-pay | Admitting: Internal Medicine

## 2020-11-16 ENCOUNTER — Encounter: Payer: Self-pay | Admitting: Internal Medicine

## 2020-11-19 ENCOUNTER — Other Ambulatory Visit: Payer: Self-pay | Admitting: Physical Medicine and Rehabilitation

## 2020-11-23 ENCOUNTER — Encounter: Payer: Self-pay | Admitting: Family

## 2020-11-23 ENCOUNTER — Other Ambulatory Visit: Payer: Self-pay

## 2020-11-23 ENCOUNTER — Ambulatory Visit: Payer: 59 | Admitting: Family

## 2020-11-23 VITALS — BP 117/79 | HR 92 | Temp 98.6°F | Ht 70.0 in | Wt 137.6 lb

## 2020-11-23 DIAGNOSIS — D500 Iron deficiency anemia secondary to blood loss (chronic): Secondary | ICD-10-CM | POA: Diagnosis not present

## 2020-11-23 DIAGNOSIS — K909 Intestinal malabsorption, unspecified: Secondary | ICD-10-CM

## 2020-11-23 DIAGNOSIS — R197 Diarrhea, unspecified: Secondary | ICD-10-CM

## 2020-11-23 DIAGNOSIS — D509 Iron deficiency anemia, unspecified: Secondary | ICD-10-CM | POA: Diagnosis not present

## 2020-11-23 DIAGNOSIS — E538 Deficiency of other specified B group vitamins: Secondary | ICD-10-CM | POA: Diagnosis not present

## 2020-11-23 DIAGNOSIS — Z09 Encounter for follow-up examination after completed treatment for conditions other than malignant neoplasm: Secondary | ICD-10-CM

## 2020-11-23 LAB — HEMOGLOBIN, FINGERSTICK: Hemoglobin: 10.9 g/dL — ABNORMAL LOW (ref 12.6–17.7)

## 2020-11-23 NOTE — Progress Notes (Signed)
Subjective:    Patient ID: [name], male    DOB: [birth year], 60 y.o.   MRN: [id number]  Chief Complaint  Patient presents with  . Hospitalization Follow-up    HPI Pt presents to the office today for hospital follow up. He went to the ED on 11/11/20 with a hgb of 5.6. He had a EGD and coloscopy.  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   He is followed by GI and has an appt set up with a nutritionists.   He denies any fatigue, SOB, dizziness.   He states he continues to have a small amount of blood in his stools.    Review of Systems  All other systems reviewed and are negative.      Objective:   Physical Exam Vitals reviewed.  Constitutional:      General: He is not in acute distress.    Appearance: He is well-developed and well-nourished.  HENT:     Head: Normocephalic.     Right Ear: Tympanic membrane normal.     Left Ear: Tympanic membrane normal.     Mouth/Throat:     Mouth: Oropharynx is clear and moist.  Eyes:     General:        Right eye: No discharge.        Left eye: No discharge.     Pupils: Pupils are equal, round, and reactive to light.  Neck:     Thyroid: No thyromegaly.  Cardiovascular:     Rate and Rhythm: Normal rate and regular rhythm.     Pulses: Intact distal pulses.     Heart sounds: Normal heart sounds. No murmur heard.   Pulmonary:     Effort: Pulmonary effort is normal. No respiratory distress.     Breath sounds: Normal breath sounds. No wheezing.  Abdominal:     General: Bowel sounds are normal. There is no distension.     Palpations: Abdomen is soft.     Tenderness: There is no abdominal tenderness.  Musculoskeletal:        General: No tenderness or edema. Normal range of motion.     Cervical back: Normal range of motion and neck supple.  Skin:    General: Skin is warm and dry.     Findings: No erythema  or rash.  Neurological:     Mental Status: He is alert and oriented to person, place, and time.     Cranial Nerves: No cranial nerve deficit.     Deep Tendon Reflexes: Reflexes are normal and symmetric.  Psychiatric:        Mood and Affect: Mood and affect normal.        Behavior: Behavior normal.        Thought Content: Thought content normal.        Judgment: Judgment normal.       BP 117/79   Pulse 92   Temp 98.6 F (37 C) (Temporal)   Ht '5\' 10"'  (1.778 m)   Wt 137 lb 9.6 oz (62.4 kg)   BMI 19.74 kg/m      Assessment & Plan:  [name] comes in today with chief complaint of Hospitalization Follow-up   Diagnosis and orders addressed:  1. Hospital discharge follow-up - Anemia Profile B - Hemoglobin, fingerstick - BMP8+EGFR  2. Anemia due to blood loss - Anemia Profile B - Hemoglobin, fingerstick -  BMP8+EGFR  3. Vitamin B 12 deficiency - Anemia Profile B - Hemoglobin, fingerstick - BMP8+EGFR  4. Iron deficiency anemia, unspecified iron deficiency anemia type - Anemia Profile B - Hemoglobin, fingerstick - BMP8+EGFR  5. Diarrhea due to malabsorption - Anemia Profile B - Hemoglobin, fingerstick - BMP8+EGFR   Labs pending Health Maintenance reviewed Diet and exercise encouraged  Follow up plan: 1 month   Evelina Dun, FNP

## 2020-11-23 NOTE — Patient Instructions (Signed)
[employer] medicine (25th ed., pp. [phone]). [place], [place]: Elsevier.">  Anemia  Anemia is a condition in which there is not enough red blood cells or hemoglobin in the blood. Hemoglobin is a substance in red blood cells that carries oxygen. When you do not have enough red blood cells or hemoglobin (are anemic), your body cannot get enough oxygen and your organs may not work properly. As a result, you may feel very tired or have other problems. What are the causes? Common causes of anemia include:  Excessive bleeding. Anemia can be caused by excessive bleeding inside or outside the body, including bleeding from the intestines or from heavy menstrual periods in females.  Poor nutrition.  Long-lasting (chronic) kidney, thyroid, and liver disease.  Bone marrow disorders, spleen problems, and blood disorders.  Cancer and treatments for cancer.  HIV (human immunodeficiency virus) and AIDS (acquired immunodeficiency syndrome).  Infections, medicines, and autoimmune disorders that destroy red blood cells. What are the signs or symptoms? Symptoms of this condition include:  Minor weakness.  Dizziness.  Headache, or difficulties concentrating and sleeping.  Heartbeats that feel irregular or faster than normal (palpitations).  Shortness of breath, especially with exercise.  Pale skin, lips, and nails, or cold hands and feet.  Indigestion and nausea. Symptoms may occur suddenly or develop slowly. If your anemia is mild, you may not have symptoms. How is this diagnosed? This condition is diagnosed based on blood tests, your medical history, and a physical exam. In some cases, a test may be needed in which cells are removed from the soft tissue inside of a bone and looked at under a microscope (bone marrow biopsy). Your health care provider may also check your stool (feces) for blood and may do additional testing to look for the cause of your bleeding. Other tests may  include:  Imaging tests, such as a CT scan or MRI.  A procedure to see inside your esophagus and stomach (endoscopy).  A procedure to see inside your colon and rectum (colonoscopy). How is this treated? Treatment for this condition depends on the cause. If you continue to lose a lot of blood, you may need to be treated at a hospital. Treatment may include:  Taking supplements of iron, vitamin Q68, or folic acid.  Taking a hormone medicine (erythropoietin) that can help to stimulate red blood cell growth.  Having a blood transfusion. This may be needed if you lose a lot of blood.  Making changes to your diet.  Having surgery to remove your spleen. Follow these instructions at home:  Take over-the-counter and prescription medicines only as told by your health care provider.  Take supplements only as told by your health care provider.  Follow any diet instructions that you were given by your health care provider.  Keep all follow-up visits as told by your health care provider. This is important. Contact a health care provider if:  You develop new bleeding anywhere in the body. Get help right away if:  You are very weak.  You are short of breath.  You have pain in your abdomen or chest.  You are dizzy or feel faint.  You have trouble concentrating.  You have bloody stools, black stools, or tarry stools.  You vomit repeatedly or you vomit up blood. These symptoms may represent a serious problem that is an emergency. Do not wait to see if the symptoms will go away. Get medical help right away. Call your local emergency services (911 in the [place] Do not  drive yourself to the hospital. Summary  Anemia is a condition in which you do not have enough red blood cells or enough of a substance in your red blood cells that carries oxygen (hemoglobin).  Symptoms may occur suddenly or develop slowly.  If your anemia is mild, you may not have symptoms.  This condition is  diagnosed with blood tests, a medical history, and a physical exam. Other tests may be needed.  Treatment for this condition depends on the cause of the anemia. This information is not intended to replace advice given to you by your health care provider. Make sure you discuss any questions you have with your health care provider. Document Revised: 08/31/2019 Document Reviewed: 08/31/2019 Elsevier Patient Education  2021 Elsevier Inc.  

## 2020-11-24 LAB — BMP8+EGFR
BUN/Creatinine Ratio: 22 (ref 10–24)
BUN: 22 mg/dL (ref 8–27)
CO2: 24 mmol/L (ref 20–29)
Calcium: 9.5 mg/dL (ref 8.6–10.2)
Chloride: 100 mmol/L (ref 96–106)
Creatinine, Ser: 1.01 mg/dL (ref 0.76–1.27)
GFR calc Af Amer: 93 mL/min/{1.73_m2} (ref >59)
GFR calc non Af Amer: 80 mL/min/{1.73_m2} (ref >59)
Glucose: 90 mg/dL (ref 65–99)
Potassium: 4.8 mmol/L (ref 3.5–5.2)
Sodium: 140 mmol/L (ref 134–144)

## 2020-11-24 LAB — ANEMIA PROFILE B
Basophils Absolute: 0.1 10*3/uL (ref 0.0–0.2)
Basos: 1 %
EOS (ABSOLUTE): 0.1 10*3/uL (ref 0.0–0.4)
Eos: 1 %
Ferritin: 216 ng/mL (ref 30–400)
Folate: >20 ng/mL (ref >3.0)
Hematocrit: 35.3 % — ABNORMAL LOW (ref 37.5–51.0)
Hemoglobin: 11.3 g/dL — ABNORMAL LOW (ref 13.0–17.7)
Immature Grans (Abs): 0 10*3/uL (ref 0.0–0.1)
Immature Granulocytes: 0 %
Iron Saturation: 22 % (ref 15–55)
Iron: 86 ug/dL (ref 38–169)
Lymphocytes Absolute: 2.3 10*3/uL (ref 0.7–3.1)
Lymphs: 33 %
MCH: 28.9 pg (ref 26.6–33.0)
MCHC: 32 g/dL (ref 31.5–35.7)
MCV: 90 fL (ref 79–97)
Monocytes Absolute: 0.7 10*3/uL (ref 0.1–0.9)
Monocytes: 10 %
Neutrophils Absolute: 3.6 10*3/uL (ref 1.4–7.0)
Neutrophils: 55 %
Platelets: 442 10*3/uL (ref 150–450)
RBC: 3.91 x10E6/uL — ABNORMAL LOW (ref 4.14–5.80)
RDW: 23.6 % — ABNORMAL HIGH (ref 11.6–15.4)
Retic Ct Pct: 2.4 % (ref 0.6–2.6)
Total Iron Binding Capacity: 393 ug/dL (ref 250–450)
UIBC: 307 ug/dL (ref 111–343)
Vitamin B-12: 344 pg/mL (ref 232–1245)
WBC: 6.8 10*3/uL (ref 3.4–10.8)

## 2020-12-01 ENCOUNTER — Other Ambulatory Visit: Payer: Self-pay | Admitting: Internal Medicine

## 2020-12-08 ENCOUNTER — Encounter: Payer: 59 | Attending: Internal Medicine | Admitting: Dietician

## 2020-12-08 ENCOUNTER — Encounter: Payer: Self-pay | Admitting: Dietician

## 2020-12-08 ENCOUNTER — Other Ambulatory Visit: Payer: Self-pay

## 2020-12-08 DIAGNOSIS — E43 Unspecified severe protein-calorie malnutrition: Secondary | ICD-10-CM
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
## 2020-12-08 NOTE — Patient Instructions (Addendum)
Drink a Gatorade G2 or powerade ZERO to stay hydrated.  Salt your foods to taste.  Choose a baked potato chip or low fat tortilla  Have peanut butter often   Begin to eat some beef chili with beans and tomato sauce.  Take on multivitamin in the morning, and one in the night.  **EAT! EAT! EAT!**

## 2020-12-12 ENCOUNTER — Ambulatory Visit (INDEPENDENT_AMBULATORY_CARE_PROVIDER_SITE_OTHER): Payer: 59 | Admitting: Internal Medicine

## 2020-12-12 ENCOUNTER — Encounter: Payer: Self-pay | Admitting: Internal Medicine

## 2020-12-12 ENCOUNTER — Other Ambulatory Visit: Payer: Self-pay

## 2020-12-12 ENCOUNTER — Ambulatory Visit: Payer: 59 | Admitting: Family

## 2020-12-12 VITALS — BP 118/70 | HR 84 | Temp 97.5°F | Ht 70.0 in | Wt 142.2 lb

## 2020-12-12 DIAGNOSIS — K921 Melena: Secondary | ICD-10-CM | POA: Diagnosis not present

## 2020-12-12 DIAGNOSIS — K912 Postsurgical malabsorption, not elsewhere classified: Secondary | ICD-10-CM

## 2020-12-12 NOTE — Patient Instructions (Signed)
As discussed, a capsule study of your small bowel is recommended to complete your GI evaluation.  However, for now, we will hold off on that avenue of evaluation at your request.  I recommend you very slowly increase your oral intake - about a 100 calories daily - stabilize for 3- 4 weeks- and then increase by another 100 calories in similar fashion   Get your lab work as scheduled at your primary care provider's office.  We will see you back in the office in about 6 weeks
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
## 2020-12-21 ENCOUNTER — Other Ambulatory Visit: Payer: Self-pay

## 2020-12-21 ENCOUNTER — Encounter: Payer: Self-pay | Admitting: Family

## 2020-12-21 ENCOUNTER — Ambulatory Visit: Payer: 59 | Admitting: Family

## 2020-12-21 VITALS — BP 129/76 | HR 99 | Temp 98.1°F | Ht 70.0 in | Wt 137.8 lb

## 2020-12-21 DIAGNOSIS — I10 Essential (primary) hypertension: Secondary | ICD-10-CM | POA: Diagnosis not present

## 2020-12-21 DIAGNOSIS — I6930 Unspecified sequelae of cerebral infarction: Secondary | ICD-10-CM | POA: Diagnosis not present

## 2020-12-21 DIAGNOSIS — E43 Unspecified severe protein-calorie malnutrition: Secondary | ICD-10-CM

## 2020-12-21 DIAGNOSIS — D62 Acute posthemorrhagic anemia: Secondary | ICD-10-CM | POA: Diagnosis not present

## 2020-12-21 DIAGNOSIS — G629 Polyneuropathy, unspecified: Secondary | ICD-10-CM

## 2020-12-21 LAB — HEMOGLOBIN, FINGERSTICK: Hemoglobin: 10.1 g/dL — ABNORMAL LOW (ref 12.6–17.7)

## 2020-12-21 MED ORDER — GABAPENTIN 300 MG PO CAPS: 300.0000 mg | ORAL_CAPSULE | Freq: Every day | ORAL | 3 refills | Status: DC

## 2020-12-21 MED ORDER — AMLODIPINE BESYLATE 5 MG PO TABS: 5.0000 mg | ORAL_TABLET | Freq: Every day | ORAL | 0 refills | Status: DC

## 2020-12-21 MED ORDER — ATORVASTATIN CALCIUM 10 MG PO TABS: 10.0000 mg | ORAL_TABLET | Freq: Every day | ORAL | 0 refills | Status: DC

## 2020-12-21 NOTE — Progress Notes (Signed)
Subjective:    Patient ID: [name], male    DOB: January 14, 1961, 60 y.o.   MRN: [id number]  Chief Complaint  Patient presents with  . Follow-up   PT presents to the office to recheck neuropathy. He was seen on 11/10/20 and we started on gabapentin 300 mg TID. He states his pain has slightly improved. States he is sleeping much better since starting. He continues to have numbness in bilateral legs.   He also had a GI bleed. He is saw GI on 12/12/20 and he is planned on doing a capsule study. GI also recommended increase oral intake 100 calories daily. Has followed up in 6 weeks.   He has lost 4 lbs over the last month.  Anemia Presents for follow-up visit. Symptoms include malaise/fatigue. Side effects of medications include fatigue.       Review of Systems  Constitutional: Positive for fatigue and malaise/fatigue.  All other systems reviewed and are negative.      Objective:   Physical Exam Vitals reviewed.  Constitutional:      General: He is not in acute distress.    Appearance: He is well-developed.     Comments: Underweight   HENT:     Head: Normocephalic.  Eyes:     General:        Right eye: No discharge.        Left eye: No discharge.     Pupils: Pupils are equal, round, and reactive to light.  Neck:     Thyroid: No thyromegaly.  Cardiovascular:     Rate and Rhythm: Normal rate and regular rhythm.     Heart sounds: Normal heart sounds. No murmur heard.   Pulmonary:     Effort: Pulmonary effort is normal. No respiratory distress.     Breath sounds: Normal breath sounds. No wheezing.  Abdominal:     General: Bowel sounds are normal. There is no distension.     Palpations: Abdomen is soft.     Tenderness: There is no abdominal tenderness.  Musculoskeletal:        General: No tenderness. Normal range of motion.     Cervical back: Normal range of motion and neck supple.  Skin:    General: Skin is warm and dry.     Findings: No erythema or rash.   Neurological:     Mental Status: He is alert and oriented to person, place, and time.     Cranial Nerves: No cranial nerve deficit.     Deep Tendon Reflexes: Reflexes are normal and symmetric.  Psychiatric:        Behavior: Behavior normal.        Thought Content: Thought content normal.        Judgment: Judgment normal.          BP 129/76   Pulse 99   Temp 98.1 F (36.7 C) (Temporal)   Ht '5\' 10"'  (1.778 m)   Wt 137 lb 12.8 oz (62.5 kg)   BMI 19.77 kg/m   Assessment & Plan:  [name] comes in today with chief complaint of Follow-up   Diagnosis and orders addressed:  1. Late effect of cerebrovascular accident (CVA) - atorvastatin (LIPITOR) 10 MG tablet; Take 1 tablet (10 mg total) by mouth daily.  Dispense: 90 tablet; Refill: 0 - gabapentin (NEURONTIN) 300 MG capsule; Take 1 capsule (300 mg total) by mouth at bedtime.  Dispense: 90 capsule; Refill: 3 - CBC with Differential/Platelet - BMP8+EGFR  2.  Essential hypertension - amLODipine (NORVASC) 5 MG tablet; Take 1 tablet (5 mg total) by mouth daily.  Dispense: 90 tablet; Refill: 0 - CBC with Differential/Platelet - BMP8+EGFR  3. Protein-calorie malnutrition, severe -Encouraged to increase calories by 100 gradually - CBC with Differential/Platelet - BMP8+EGFR  4. Acute blood loss anemia Labs pending Keep follow up with GI - Hemoglobin, fingerstick - CBC with Differential/Platelet - BMP8+EGFR  5. Neuropathy Continue Gabapentin      Evelina Dun, FNP

## 2020-12-21 NOTE — Patient Instructions (Signed)
Gastrointestinal Bleeding Gastrointestinal (GI) bleeding is bleeding somewhere along the digestive tract, between the mouth and the anus. This tract includes the mouth, esophagus, stomach, small intestine, large intestine, and anus. The large intestine is often called the colon. GI bleeding can be caused by various problems. The severity of these problems can range from mild to serious or even life-threatening. If you have GI bleeding, you may find blood in your stools (feces), you may have black stools, or you may vomit blood. If there is a lot of bleeding, you may need to stay in the hospital. What are the causes? This condition may be caused by:  Inflammation, irritation, or swelling of the esophagus (esophagitis). The esophagus is part of the body that moves food from your mouth to your stomach.  Swollen veins in the rectum (hemorrhoids).  Areas of painful tearing in the anus that are often caused by passing hard stool (anal fissures).  Pouches that form on the colon over time, with age, and may bleed a lot (diverticulosis).  Inflammation (diverticulitis) in areas with diverticulosis. This can cause pain, fever, and bloody stools, although bleeding may be mild.  Growths (polyps) or cancer. Colon cancer often starts out as precancerous polyps.  Gastritis and ulcers. With these, bleeding may come from the upper GI tract, near the stomach. What increases the risk? You are more likely to develop this condition if you:  Have an infection in your stomach from a type of bacteria called Helicobacter pylori.  Take certain medicines, such as: ? NSAIDs. ? Aspirin. ? Selective serotonin reuptake inhibitors (SSRIs). ? Steroids. ? Antiplatelet or anticoagulant medicines.  Smoke.  Drink alcohol. What are the signs or symptoms? Common symptoms of this condition include:  Bright red blood in your vomit, or vomit that looks like coffee grounds.  Bloody, black, or tarry stools. ? Bleeding  from the lower GI tract will usually cause red or maroon blood in the stools. ? Bleeding from the upper GI tract may cause black, tarry stools that are often stronger smelling than usual. ? In certain cases, if the bleeding is fast enough, the stools may be red.  Pain or cramping in the abdomen. How is this diagnosed? This condition may be diagnosed based on:  Your medical history and a physical exam.  Various tests, such as: ? Blood tests. ? Stool tests. ? X-rays and other imaging tests. ? Esophagogastroduodenoscopy (EGD). In this test, a flexible, lighted tube is used to look at your esophagus, stomach, and small intestine. ? Colonoscopy. In this test, a flexible, lighted tube is used to look at your colon. How is this treated? Treatment for this condition depends on the cause of the bleeding. For example:  For bleeding from the esophagus, stomach, small intestine, or colon, the health care provider doing your EGD or colonoscopy may be able to stop the bleeding as part of the procedure.  Inflammation or infection of the colon can be treated with medicines.  Certain rectal problems can be treated with creams, suppositories, or warm baths.  Medicines may be given to reduce acid in your stomach.  Surgery is sometimes needed.  Blood transfusions are sometimes needed if a lot of blood has been lost. If bleeding is mild, you may be allowed to go home. If there is a lot of bleeding, you will need to stay in the hospital for observation. Follow these instructions at home:  Take over-the-counter and prescription medicines only as told by your health care provider.  Eat   foods that are high in fiber, such as beans, whole grains, and fresh fruits and vegetables. This will help to keep your stools soft. Eating 1-3 prunes each day works well for many people.  Drink enough fluid to keep your urine pale yellow.  Keep all follow-up visits as told by your health care provider. This is  important.   Contact a health care provider if:  Your symptoms do not improve. Get help right away if:  Your bleeding does not stop.  You feel light-headed or you faint.  You feel weak.  You have severe cramps in your back or abdomen.  You pass large blood clots in your stool.  Your symptoms are getting worse.  You have chest pain or fast heartbeats. Summary  Gastrointestinal (GI) bleeding is bleeding somewhere along the digestive tract, between the mouth and anus. GI bleeding can be caused by various problems. The severity of these problems can range from mild to serious or even life-threatening.  Treatment for this condition depends on the cause of the bleeding.  Take over-the-counter and prescription medicines only as told by your health care provider.  Keep all follow-up visits as told by your health care provider. This is important.  Get help right away if your bleeding increases, your symptoms are getting worse, or you have new symptoms. This information is not intended to replace advice given to you by your health care provider. Make sure you discuss any questions you have with your health care provider. Document Revised: 05/06/2018 Document Reviewed: 05/06/2018 Elsevier Patient Education  2021 [employer]vier Inc[employer]  

## 2020-12-22 LAB — BMP8+EGFR
BUN/Creatinine Ratio: 27 — ABNORMAL HIGH (ref 10–24)
BUN: 26 mg/dL (ref 8–27)
CO2: 25 mmol/L (ref 20–29)
Calcium: 9.5 mg/dL (ref 8.6–10.2)
Chloride: 100 mmol/L (ref 96–106)
Creatinine, Ser: 0.98 mg/dL (ref 0.76–1.27)
Glucose: 98 mg/dL (ref 65–99)
Potassium: 4.4 mmol/L (ref 3.5–5.2)
Sodium: 138 mmol/L (ref 134–144)
eGFR: 88 mL/min/{1.73_m2} (ref >59)

## 2020-12-22 LAB — CBC WITH DIFFERENTIAL/PLATELET
Basophils Absolute: 0 10*3/uL (ref 0.0–0.2)
Basos: 1 %
EOS (ABSOLUTE): 0.2 10*3/uL (ref 0.0–0.4)
Eos: 4 %
Hematocrit: 33.1 % — ABNORMAL LOW (ref 37.5–51.0)
Hemoglobin: 10.4 g/dL — ABNORMAL LOW (ref 13.0–17.7)
Immature Grans (Abs): 0 10*3/uL (ref 0.0–0.1)
Immature Granulocytes: 0 %
Lymphocytes Absolute: 1.6 10*3/uL (ref 0.7–3.1)
Lymphs: 34 %
MCH: 30 pg (ref 26.6–33.0)
MCHC: 31.4 g/dL — ABNORMAL LOW (ref 31.5–35.7)
MCV: 95 fL (ref 79–97)
Monocytes Absolute: 0.5 10*3/uL (ref 0.1–0.9)
Monocytes: 11 %
Neutrophils Absolute: 2.5 10*3/uL (ref 1.4–7.0)
Neutrophils: 50 %
Platelets: 415 10*3/uL (ref 150–450)
RBC: 3.47 x10E6/uL — ABNORMAL LOW (ref 4.14–5.80)
RDW: 19.6 % — ABNORMAL HIGH (ref 11.6–15.4)
WBC: 4.9 10*3/uL (ref 3.4–10.8)

## 2021-01-02 ENCOUNTER — Other Ambulatory Visit: Payer: Self-pay | Admitting: Internal Medicine

## 2021-01-04 ENCOUNTER — Other Ambulatory Visit: Payer: Self-pay | Admitting: Internal Medicine

## 2021-01-16 ENCOUNTER — Other Ambulatory Visit: Payer: Self-pay | Admitting: Family Medicine

## 2021-01-16 ENCOUNTER — Ambulatory Visit (INDEPENDENT_AMBULATORY_CARE_PROVIDER_SITE_OTHER): Payer: 59 | Admitting: Family

## 2021-01-16 ENCOUNTER — Telehealth: Payer: Self-pay | Admitting: Internal Medicine

## 2021-01-16 ENCOUNTER — Telehealth: Payer: Self-pay

## 2021-01-16 ENCOUNTER — Encounter: Payer: Self-pay | Admitting: Family

## 2021-01-16 DIAGNOSIS — R531 Weakness: Secondary | ICD-10-CM

## 2021-01-16 DIAGNOSIS — D500 Iron deficiency anemia secondary to blood loss (chronic): Secondary | ICD-10-CM

## 2021-01-16 DIAGNOSIS — K922 Gastrointestinal hemorrhage, unspecified: Secondary | ICD-10-CM

## 2021-01-16 LAB — HEMOGLOBIN, FINGERSTICK: Hemoglobin: 8 g/dL — ABNORMAL LOW (ref 12.6–17.7)

## 2021-01-16 NOTE — Progress Notes (Signed)
   Virtual Visit  Note Due to COVID-19 pandemic this visit was conducted virtually. This visit type was conducted due to national recommendations for restrictions regarding the COVID-19 Pandemic (e.g. social distancing, sheltering in place) in an effort to limit this patient's exposure and mitigate transmission in our community. All issues noted in this document were discussed and addressed.  A physical exam was not performed with this format.  I connected with [name] on 01/16/21 at 12:26 pm  by telephone and verified that I am speaking with the correct person using two identifiers. [name] is currently located at car and no one is currently with him  during visit. The provider, Evelina Dun, FNP is located in their office at time of visit.  I discussed the limitations, risks, security and privacy concerns of performing an evaluation and management service by telephone and the availability of in person appointments. I also discussed with the patient that there may be a patient responsible charge related to this service. The patient expressed understanding and agreed to proceed.   History and Present Illness:  HPI Pt calls today with increased weakness and fatigue. He states he bought a monitor at home to check his Hgb and last night it read 6.7 and this morning it was 9.5. He wants to get this checked.   His Hgb on 12/21/20 was 10.1. Today it is 8.0.   He is followed by GI and has appt 01/21/21.  States he has mild bleeding in his stool.    Review of Systems  Constitutional: Positive for malaise/fatigue.  All other systems reviewed and are negative.    Observations/Objective: No SOB or distress noted   Assessment and Plan: 1. Anemia due to blood loss - Hemoglobin, fingerstick  2. Weakness  3. Gastrointestinal hemorrhage, unspecified gastrointestinal hemorrhage type  Hgb has decreased from last draw.  Pt will call GI and let them know.  Keep follow up app with  them next week      I discussed the assessment and treatment plan with the patient. The patient was provided an opportunity to ask questions and all were answered. The patient agreed with the plan and demonstrated an understanding of the instructions.   The patient was advised to call back or seek an in-person evaluation if the symptoms worsen or if the condition fails to improve as anticipated.  The above assessment and management plan was discussed with the patient. The patient verbalized understanding of and has agreed to the management plan. Patient is aware to call the clinic if symptoms persist or worsen. Patient is aware when to return to the clinic for a follow-up visit. Patient educated on when it is appropriate to go to the emergency department.   Time call ended:  12:28 pm   I provided 12 minutes of  non face-to-face time during this encounter.    Evelina Dun, FNP

## 2021-01-16 NOTE — Telephone Encounter (Signed)
Order out in [name] will do televisit call

## 2021-01-16 NOTE — Telephone Encounter (Signed)
Spoke with pt. Pt's hemoglobin is currently 8.1. pt states that he felt lightheaded previously but is feeling better this week. His doctor has been check his h/h and pt wanted to make sure he didn't need to be seen before next week.  Pt was advised to watch out for any blood when having a bowel movement, monitor any changes, go to the ED if he has anymore lightheadedness, dizziness ect.

## 2021-01-16 NOTE — Telephone Encounter (Signed)
PATIENT CALLED AND SAID HIS HEMAGLOBIN WAS DOWN TO 8.1   HAS AN UPCOMING APPOINTMENT WITH DR Gala Romney AND WANTED TO KNOW IF SOMETHING NEEDS TO BE DONE PRIOR TO HIS APPOINTMENT NEXT WEEK

## 2021-01-16 NOTE — Telephone Encounter (Signed)
Lmom, waiting on a return call.  

## 2021-01-17 ENCOUNTER — Other Ambulatory Visit: Payer: Self-pay

## 2021-01-17 DIAGNOSIS — D649 Anemia, unspecified: Secondary | ICD-10-CM

## 2021-01-17 NOTE — Telephone Encounter (Signed)
Pt returned call and is aware that Dr. Gala Romney wants him to complete h/h labs on Monday 01/22/21 before his apt on 01/23/21. Lab orders placed for [facility] lab.

## 2021-01-19 ENCOUNTER — Other Ambulatory Visit: Payer: Self-pay | Admitting: Adult Health

## 2021-01-22 LAB — HEMOGLOBIN AND HEMATOCRIT, BLOOD
HCT: 31.1 % — ABNORMAL LOW (ref 38.5–50.0)
Hemoglobin: 9.3 g/dL — ABNORMAL LOW (ref 13.2–17.1)

## 2021-01-23 ENCOUNTER — Telehealth: Payer: Self-pay | Admitting: Internal Medicine

## 2021-01-23 ENCOUNTER — Encounter: Payer: Self-pay | Admitting: Internal Medicine

## 2021-01-23 ENCOUNTER — Telehealth: Payer: Self-pay | Admitting: *Deleted

## 2021-01-23 ENCOUNTER — Other Ambulatory Visit: Payer: Self-pay

## 2021-01-23 ENCOUNTER — Ambulatory Visit (INDEPENDENT_AMBULATORY_CARE_PROVIDER_SITE_OTHER): Payer: 59 | Admitting: Internal Medicine

## 2021-01-23 VITALS — BP 118/72 | HR 87 | Temp 97.3°F | Ht 70.0 in | Wt 140.4 lb

## 2021-01-23 DIAGNOSIS — D509 Iron deficiency anemia, unspecified: Secondary | ICD-10-CM

## 2021-01-23 DIAGNOSIS — E538 Deficiency of other specified B group vitamins: Secondary | ICD-10-CM

## 2021-01-23 DIAGNOSIS — K529 Noninfective gastroenteritis and colitis, unspecified: Secondary | ICD-10-CM | POA: Diagnosis not present

## 2021-01-23 DIAGNOSIS — D649 Anemia, unspecified: Secondary | ICD-10-CM

## 2021-01-23 DIAGNOSIS — K912 Postsurgical malabsorption, not elsewhere classified: Secondary | ICD-10-CM

## 2021-01-23 DIAGNOSIS — K921 Melena: Secondary | ICD-10-CM

## 2021-01-23 DIAGNOSIS — R634 Abnormal weight loss: Secondary | ICD-10-CM

## 2021-01-23 NOTE — Telephone Encounter (Signed)
Called pt. Scheduled agile capsule for 4/25 at 7:30am, arrival 7:00am. Discussed instructions and also sent them to his mychart. He voiced understanding.  Checked [employer] and no PA is required

## 2021-01-23 NOTE — Telephone Encounter (Signed)
[name] called asking if patient would need a driver the day he has a capsule study. [phone]
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
## 2021-01-23 NOTE — Patient Instructions (Addendum)
Continue omeprazole 20 mg daily  Continue Imodium up to 4 times daily after each loose stool  Continue to increase caloric intake (particularly protein calories) Slowly in small increments.  Continue B12 injections.  Prescription for [employer] cream (dispense 30 g, add lidocaine, add nitroglycerin); apply to the anorectum 3 times a day as needed -watch for potential headache and dizziness).  1 refill.  Proceed with patency capsule study the small bowel in preparation for video capsule study  Further recommendations to follow.

## 2021-01-29 ENCOUNTER — Encounter (HOSPITAL_COMMUNITY)
Admission: RE | Disposition: A | Discharge status: Home / Self Care | Payer: Self-pay | Source: Home / Self Care | Attending: Internal Medicine

## 2021-01-29 ENCOUNTER — Encounter (HOSPITAL_COMMUNITY): Payer: Self-pay | Admitting: Internal Medicine

## 2021-01-29 ENCOUNTER — Ambulatory Visit (HOSPITAL_COMMUNITY)
Admission: RE | Admit: 2021-01-29 | Discharge: 2021-01-29 | Disposition: A | Discharge status: Home / Self Care | Payer: 59 | Attending: Internal Medicine | Admitting: Internal Medicine

## 2021-01-29 DIAGNOSIS — D649 Anemia, unspecified: Secondary | ICD-10-CM | POA: Insufficient documentation

## 2021-01-29 HISTORY — PX: AGILE CAPSULE: SHX5420

## 2021-01-29 SURGERY — AGILE CAPSULE

## 2021-01-30 ENCOUNTER — Ambulatory Visit (HOSPITAL_COMMUNITY)
Admission: RE | Admit: 2021-01-30 | Discharge: 2021-01-30 | Disposition: A | Discharge status: Home / Self Care | Payer: 59 | Source: Ambulatory Visit | Attending: Internal Medicine | Admitting: Internal Medicine

## 2021-01-30 DIAGNOSIS — D649 Anemia, unspecified: Secondary | ICD-10-CM | POA: Insufficient documentation

## 2021-01-31 ENCOUNTER — Other Ambulatory Visit: Payer: Self-pay | Admitting: Gastroenterology

## 2021-01-31 ENCOUNTER — Encounter (HOSPITAL_COMMUNITY): Payer: Self-pay | Admitting: Internal Medicine

## 2021-02-02 ENCOUNTER — Ambulatory Visit: Payer: 59 | Admitting: Physical Medicine and Rehabilitation

## 2021-02-07 ENCOUNTER — Encounter: Payer: 59 | Attending: Physical Medicine and Rehabilitation | Admitting: Physical Medicine and Rehabilitation

## 2021-02-07 ENCOUNTER — Other Ambulatory Visit: Payer: Self-pay

## 2021-02-07 ENCOUNTER — Encounter: Payer: Self-pay | Admitting: Physical Medicine and Rehabilitation

## 2021-02-07 VITALS — BP 118/70 | HR 84 | Temp 98.5°F | Ht 70.0 in | Wt 137.0 lb

## 2021-02-07 DIAGNOSIS — I6330 Cerebral infarction due to thrombosis of unspecified cerebral artery: Secondary | ICD-10-CM

## 2021-02-07 DIAGNOSIS — T07XXXA Unspecified multiple injuries, initial encounter: Secondary | ICD-10-CM

## 2021-02-07 DIAGNOSIS — S72351A Displaced comminuted fracture of shaft of right femur, initial encounter for closed fracture: Secondary | ICD-10-CM | POA: Diagnosis present

## 2021-02-07 MED ORDER — AMITRIPTYLINE HCL 10 MG PO TABS: 10.0000 mg | ORAL_TABLET | Freq: Every day | ORAL | 1 refills | Status: DC

## 2021-02-07 NOTE — Patient Instructions (Signed)
Insomnia: -Try to go outside near sunrise -Get exercise during the day.  -Discussed good sleep hygiene: turning off all devices an hour before bedtime.  -Chamomile tea with dinner.  -Can consider over the counter melatonin
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
## 2021-02-09 ENCOUNTER — Ambulatory Visit: Payer: 59 | Admitting: Dietician

## 2021-02-09 ENCOUNTER — Ambulatory Visit: Payer: 59 | Admitting: Physical Medicine and Rehabilitation

## 2021-02-12 ENCOUNTER — Ambulatory Visit (INDEPENDENT_AMBULATORY_CARE_PROVIDER_SITE_OTHER): Payer: 59 | Admitting: Adult Health

## 2021-02-12 ENCOUNTER — Encounter: Payer: Self-pay | Admitting: Adult Health

## 2021-02-12 VITALS — BP 124/82 | HR 97 | Ht 70.0 in | Wt 142.0 lb

## 2021-02-12 DIAGNOSIS — I10 Essential (primary) hypertension: Secondary | ICD-10-CM

## 2021-02-12 DIAGNOSIS — E785 Hyperlipidemia, unspecified: Secondary | ICD-10-CM

## 2021-02-12 DIAGNOSIS — K912 Postsurgical malabsorption, not elsewhere classified: Secondary | ICD-10-CM | POA: Diagnosis not present

## 2021-02-12 DIAGNOSIS — R202 Paresthesia of skin: Secondary | ICD-10-CM | POA: Diagnosis not present

## 2021-02-12 DIAGNOSIS — I6350 Cerebral infarction due to unspecified occlusion or stenosis of unspecified cerebral artery: Secondary | ICD-10-CM

## 2021-02-12 NOTE — Patient Instructions (Addendum)
Continue amitriptyline 10mg  nightly for neuropathy, restless leg and insomnia - follow up with Dr. Ranell Patrick as scheduled  Continue atorvastatin  for secondary stroke prevention  Continue to follow up with PCP regarding cholesterol and blood pressure management  Maintain strict control of hypertension with blood pressure goal below 130/90 and cholesterol with LDL cholesterol (bad cholesterol) goal below 70 mg/dL.   Continue to follow with GI for procedure as scheduled      Followup in the future with me in 6 months or call earlier if needed     Thank you for coming to see [place] at [facility]. I hope we have been able to provide you high quality care today.  You may receive a patient satisfaction survey over the next few weeks. We would appreciate your feedback and comments so that we may continue to improve ourselves and the health of our patients.
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
## 2021-02-12 NOTE — Progress Notes (Signed)
I agree with the above plan 

## 2021-03-01 ENCOUNTER — Other Ambulatory Visit: Payer: Self-pay | Admitting: Physical Medicine and Rehabilitation

## 2021-03-01 ENCOUNTER — Other Ambulatory Visit: Payer: Self-pay | Admitting: Gastroenterology

## 2021-03-07 ENCOUNTER — Encounter (HOSPITAL_COMMUNITY)
Admission: RE | Disposition: A | Discharge status: Home / Self Care | Payer: Self-pay | Source: Home / Self Care | Attending: Internal Medicine

## 2021-03-07 ENCOUNTER — Ambulatory Visit (HOSPITAL_COMMUNITY)
Admission: RE | Admit: 2021-03-07 | Discharge: 2021-03-07 | Disposition: A | Discharge status: Home / Self Care | Payer: 59 | Attending: Internal Medicine | Admitting: Internal Medicine

## 2021-03-07 DIAGNOSIS — D649 Anemia, unspecified: Secondary | ICD-10-CM | POA: Diagnosis not present

## 2021-03-07 DIAGNOSIS — K625 Hemorrhage of anus and rectum: Secondary | ICD-10-CM | POA: Diagnosis not present

## 2021-03-07 HISTORY — PX: GIVENS CAPSULE STUDY: SHX5432

## 2021-03-07 SURGERY — IMAGING PROCEDURE, GI TRACT, INTRALUMINAL, VIA CAPSULE

## 2021-03-08 ENCOUNTER — Encounter: Payer: Self-pay | Admitting: *Deleted

## 2021-03-13 ENCOUNTER — Telehealth: Payer: Self-pay | Admitting: Gastroenterology

## 2021-03-13 DIAGNOSIS — D649 Anemia, unspecified: Secondary | ICD-10-CM

## 2021-03-13 NOTE — Telephone Encounter (Signed)
Brief review on Capsule study today. Appears capsule study was complete though the quality was not great. Difficult to identify landmarks. No overt bleeding. ? Evidence of prior bleeding/oozing, but will need to review with Dr. Gala Romney.   Spoke with patient. States he hasn't had any rectal bleeding in the last couple of weeks. A couple of months ago, he was having intermittent bright red blood per rectum that started to taper off. He was left with a slight red tent in his stools intermittently, but nothing for at least 2 weeks. Denies any black stools. No lightheadedness, dizziness, or weakness. No recent blood work. States he did pass the capsule. Saw it in the commode flashing about 6.5 hrs after swallowing it.   Recommended updating CBC at this time. Let us know of recurrent rectal bleeding or of black stools. I will review further with Dr. Gala Romney with full report to come.

## 2021-03-14 ENCOUNTER — Encounter (HOSPITAL_COMMUNITY): Payer: Self-pay | Admitting: Internal Medicine

## 2021-03-14 LAB — CBC
HCT: 33.5 % — ABNORMAL LOW (ref 38.5–50.0)
Hemoglobin: 10.6 g/dL — ABNORMAL LOW (ref 13.2–17.1)
MCH: 30.5 pg (ref 27.0–33.0)
MCHC: 31.6 g/dL — ABNORMAL LOW (ref 32.0–36.0)
MCV: 96.5 fL (ref 80.0–100.0)
MPV: 10.3 fL (ref 7.5–12.5)
Platelets: 384 10*3/uL (ref 140–400)
RBC: 3.47 10*6/uL — ABNORMAL LOW (ref 4.20–5.80)
RDW: 15.6 % — ABNORMAL HIGH (ref 11.0–15.0)
WBC: 6.5 10*3/uL (ref 3.8–10.8)

## 2021-03-15 DIAGNOSIS — K625 Hemorrhage of anus and rectum: Secondary | ICD-10-CM
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
## 2021-03-15 NOTE — Progress Notes (Signed)
Phoned and LMOVM for the pt to return call 

## 2021-03-16 ENCOUNTER — Telehealth: Payer: Self-pay | Admitting: Gastroenterology

## 2021-03-16 DIAGNOSIS — D649 Anemia, unspecified: Secondary | ICD-10-CM

## 2021-03-16 DIAGNOSIS — K625 Hemorrhage of anus and rectum: Secondary | ICD-10-CM

## 2021-03-16 DIAGNOSIS — D509 Iron deficiency anemia, unspecified: Secondary | ICD-10-CM

## 2021-03-16 NOTE — Telephone Encounter (Signed)
Capsule study has been read. See separate Op note for details. Results and recommendations have been discussed with patient. He continues to do well without overt bleeding at this time. Denies any NSAID use.   [facility] Nurse: Patient needs repeat CBC in 4 weeks (around July 6th). Please arrange. Dx: Anemia.   [facility] Clinical Pool: Dr. Gala Romney has also recommended following up with primary GI trauma surgeon to get their opinion on clinical scenario (recurrent rectal bleeding/acute on chronic anemia) and colonoscopy findings (edema/congestion/erosion at anastomosis)/? ischemia at anastomosis site. Please place referral. Surgeries were completed by Dr. Lavonda Jumbo and Dr. Reather Laurence with [facility].   Manuela Schwartz: Please arrange follow-up with Dr. Gala Romney at next available date.

## 2021-03-16 NOTE — Telephone Encounter (Signed)
Lmom for pt to call me back. 

## 2021-03-16 NOTE — Addendum Note (Signed)
Addended by: Hassan Rowan on: 03/16/2021 10:55 AM   Modules accepted: Orders

## 2021-03-16 NOTE — Telephone Encounter (Signed)
Referral sent to [facility] surgery via [facility].

## 2021-03-19 ENCOUNTER — Other Ambulatory Visit: Payer: Self-pay | Admitting: *Deleted

## 2021-03-19 DIAGNOSIS — D509 Iron deficiency anemia, unspecified: Secondary | ICD-10-CM

## 2021-03-19 DIAGNOSIS — D649 Anemia, unspecified: Secondary | ICD-10-CM

## 2021-03-19 NOTE — Telephone Encounter (Signed)
Noted  

## 2021-03-19 NOTE — Telephone Encounter (Signed)
ON AUGUST RECALL

## 2021-03-19 NOTE — Telephone Encounter (Signed)
Spoke to pt.  He requested to have labs drawn on 04/04/2021 by his PCP at [place].  Will fax over lab work order and notify Aliene Altes, PA-C.

## 2021-03-23 ENCOUNTER — Ambulatory Visit: Payer: 59 | Admitting: Family

## 2021-03-29 ENCOUNTER — Other Ambulatory Visit: Payer: Self-pay | Admitting: Gastroenterology

## 2021-03-30 ENCOUNTER — Other Ambulatory Visit: Payer: Self-pay | Admitting: Family

## 2021-03-30 DIAGNOSIS — I10 Essential (primary) hypertension: Secondary | ICD-10-CM

## 2021-04-04 ENCOUNTER — Ambulatory Visit: Payer: 59 | Admitting: Family

## 2021-04-04 ENCOUNTER — Other Ambulatory Visit: Payer: Self-pay

## 2021-04-04 ENCOUNTER — Encounter: Payer: Self-pay | Admitting: Family

## 2021-04-04 VITALS — BP 132/85 | HR 96 | Temp 98.2°F | Ht 70.0 in | Wt 147.0 lb

## 2021-04-04 DIAGNOSIS — D649 Anemia, unspecified: Secondary | ICD-10-CM

## 2021-04-04 DIAGNOSIS — E785 Hyperlipidemia, unspecified: Secondary | ICD-10-CM

## 2021-04-04 DIAGNOSIS — E538 Deficiency of other specified B group vitamins: Secondary | ICD-10-CM

## 2021-04-04 DIAGNOSIS — N401 Enlarged prostate with lower urinary tract symptoms: Secondary | ICD-10-CM

## 2021-04-04 DIAGNOSIS — R351 Nocturia: Secondary | ICD-10-CM

## 2021-04-04 DIAGNOSIS — I10 Essential (primary) hypertension: Secondary | ICD-10-CM

## 2021-04-04 DIAGNOSIS — D509 Iron deficiency anemia, unspecified: Secondary | ICD-10-CM

## 2021-04-04 DIAGNOSIS — I6930 Unspecified sequelae of cerebral infarction: Secondary | ICD-10-CM | POA: Diagnosis not present

## 2021-04-04 DIAGNOSIS — Z9889 Other specified postprocedural states: Secondary | ICD-10-CM | POA: Diagnosis not present

## 2021-04-04 NOTE — Patient Instructions (Signed)
[employer] medicine (25th ed., pp. 1059-1068). [place], [place]: Elsevier.">  Anemia  Anemia is a condition in which there is not enough red blood cells or hemoglobin in the blood. Hemoglobin is a substance in red blood cells thatcarries oxygen. When you do not have enough red blood cells or hemoglobin (are anemic), your body cannot get enough oxygen and your organs may not work properly. Asa result, you may feel very tired or have other problems. What are the causes? Common causes of anemia include: Excessive bleeding. Anemia can be caused by excessive bleeding inside or outside the body, including bleeding from the intestines or from heavy menstrual periods in females. Poor nutrition. Long-lasting (chronic) kidney, thyroid, and liver disease. Bone marrow disorders, spleen problems, and blood disorders. Cancer and treatments for cancer. HIV (human immunodeficiency virus) and AIDS (acquired immunodeficiency syndrome). Infections, medicines, and autoimmune disorders that destroy red blood cells. What are the signs or symptoms? Symptoms of this condition include: Minor weakness. Dizziness. Headache, or difficulties concentrating and sleeping. Heartbeats that feel irregular or faster than normal (palpitations). Shortness of breath, especially with exercise. Pale skin, lips, and nails, or cold hands and feet. Indigestion and nausea. Symptoms may occur suddenly or develop slowly. If your anemia is mild, you maynot have symptoms. How is this diagnosed? This condition is diagnosed based on blood tests, your medical history, and a physical exam. In some cases, a test may be needed in which cells are removed from the soft tissue inside of a bone and looked at under a microscope (bone marrow biopsy). Your health care provider may also check your stool (feces) for blood and may do additional testing to look for the cause of yourbleeding. Other tests may include: Imaging tests, such as a CT scan or  MRI. A procedure to see inside your esophagus and stomach (endoscopy). A procedure to see inside your colon and rectum (colonoscopy). How is this treated? Treatment for this condition depends on the cause. If you continue to lose a lot of blood, you may need to be treated at a hospital. Treatment may include: Taking supplements of iron, vitamin B12, or folic acid. Taking a hormone medicine (erythropoietin) that can help to stimulate red blood cell growth. Having a blood transfusion. This may be needed if you lose a lot of blood. Making changes to your diet. Having surgery to remove your spleen. Follow these instructions at home: Take over-the-counter and prescription medicines only as told by your health care provider. Take supplements only as told by your health care provider. Follow any diet instructions that you were given by your health care provider. Keep all follow-up visits as told by your health care provider. This is important. Contact a health care provider if: You develop new bleeding anywhere in the body. Get help right away if: You are very weak. You are short of breath. You have pain in your abdomen or chest. You are dizzy or feel faint. You have trouble concentrating. You have bloody stools, black stools, or tarry stools. You vomit repeatedly or you vomit up blood. These symptoms may represent a serious problem that is an emergency. Do not wait to see if the symptoms will go away. Get medical help right away. Call your local emergency services (911 in the [place] Do not drive yourself to the hospital. Summary Anemia is a condition in which you do not have enough red blood cells or enough of a substance in your red blood cells that carries oxygen (hemoglobin). Symptoms may occur suddenly   or develop slowly. If your anemia is mild, you may not have symptoms. This condition is diagnosed with blood tests, a medical history, and a physical exam. Other tests may be  needed. Treatment for this condition depends on the cause of the anemia. This information is not intended to replace advice given to you by your health care provider. Make sure you discuss any questions you have with your healthcare provider. Document Revised: 08/31/2019 Document Reviewed: 08/31/2019 Elsevier Patient Education  2022 [employer].
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
## 2021-04-05 ENCOUNTER — Telehealth: Payer: Self-pay | Admitting: Internal Medicine

## 2021-04-05 LAB — ANEMIA PROFILE B
Basophils Absolute: 0 10*3/uL (ref 0.0–0.2)
Basos: 1 %
EOS (ABSOLUTE): 0.2 10*3/uL (ref 0.0–0.4)
Eos: 3 %
Ferritin: 33 ng/mL (ref 30–400)
Folate: >20 ng/mL (ref >3.0)
Hematocrit: 35.8 % — ABNORMAL LOW (ref 37.5–51.0)
Hemoglobin: 11.5 g/dL — ABNORMAL LOW (ref 13.0–17.7)
Immature Grans (Abs): 0 10*3/uL (ref 0.0–0.1)
Immature Granulocytes: 0 %
Iron Saturation: 6 % — CL (ref 15–55)
Iron: 25 ug/dL — ABNORMAL LOW (ref 38–169)
Lymphocytes Absolute: 2.1 10*3/uL (ref 0.7–3.1)
Lymphs: 34 %
MCH: 30.2 pg (ref 26.6–33.0)
MCHC: 32.1 g/dL (ref 31.5–35.7)
MCV: 94 fL (ref 79–97)
Monocytes Absolute: 0.8 10*3/uL (ref 0.1–0.9)
Monocytes: 13 %
Neutrophils Absolute: 3 10*3/uL (ref 1.4–7.0)
Neutrophils: 49 %
Platelets: 382 10*3/uL (ref 150–450)
RBC: 3.81 x10E6/uL — ABNORMAL LOW (ref 4.14–5.80)
RDW: 15.3 % (ref 11.6–15.4)
Retic Ct Pct: 1.5 % (ref 0.6–2.6)
Total Iron Binding Capacity: 387 ug/dL (ref 250–450)
UIBC: 362 ug/dL — ABNORMAL HIGH (ref 111–343)
Vitamin B-12: 457 pg/mL (ref 232–1245)
WBC: 6.1 10*3/uL (ref 3.4–10.8)

## 2021-04-05 LAB — CMP14+EGFR
ALT: 15 IU/L (ref 0–44)
AST: 22 IU/L (ref 0–40)
Albumin/Globulin Ratio: 1.5 (ref 1.2–2.2)
Albumin: 4.2 g/dL (ref 3.8–4.8)
Alkaline Phosphatase: 91 IU/L (ref 44–121)
BUN/Creatinine Ratio: 20 (ref 10–24)
BUN: 19 mg/dL (ref 8–27)
Bilirubin Total: <0.2 mg/dL (ref 0.0–1.2)
CO2: 23 mmol/L (ref 20–29)
Calcium: 9.4 mg/dL (ref 8.6–10.2)
Chloride: 102 mmol/L (ref 96–106)
Creatinine, Ser: 0.97 mg/dL (ref 0.76–1.27)
Globulin, Total: 2.8 g/dL (ref 1.5–4.5)
Glucose: 92 mg/dL (ref 65–99)
Potassium: 4.4 mmol/L (ref 3.5–5.2)
Sodium: 138 mmol/L (ref 134–144)
Total Protein: 7 g/dL (ref 6.0–8.5)
eGFR: 89 mL/min/{1.73_m2} (ref >59)

## 2021-04-05 NOTE — Telephone Encounter (Signed)
Spoke to pt.  He said he went to PCP yesterday and had blood work.  Was called with results today.  Hgb was 11.5.  Iron was real low (25).   Pt currently takes Iron in the morning and in the afternoon.  PCP told pt to consult with [place].  He said they thought we may need to up Iron to 3 times a day.  Pt still with diarrhea (loose stools).  Has 4 or 5 a day.  Pt saw Dr. Georganna Skeans on 03/28/2021 and pt said he recommended pt increasing imodium to 8 times a day instead of 4.  Pt wants a second opinion from [place].  Pt says PCP is doing repeat labs in 3 months and if no better, may recommend iron infusions.

## 2021-04-05 NOTE — Telephone Encounter (Signed)
[phone]   PATIENT CALLED AND SAID THAT HIS PCP DREW BLOOD AND TOLD HIM HIS HEMOGLOBIN WAS LOW.  WANTED TO MAKE SURE DR Gala Romney WAS AWARE, SAID THEY WERE GOING TO REFER HIM TO A HEMATOLOGIST

## 2021-04-10 NOTE — Telephone Encounter (Signed)
Lmom for pt to call back. 

## 2021-04-10 NOTE — Telephone Encounter (Signed)
Noted.  Informed pt.  Pt voiced understanding.

## 2021-04-18 ENCOUNTER — Encounter: Payer: Self-pay | Admitting: Orthopaedic Surgery

## 2021-04-18 ENCOUNTER — Other Ambulatory Visit: Payer: Self-pay

## 2021-04-18 ENCOUNTER — Ambulatory Visit: Payer: Self-pay

## 2021-04-18 ENCOUNTER — Ambulatory Visit (INDEPENDENT_AMBULATORY_CARE_PROVIDER_SITE_OTHER): Payer: 59 | Admitting: Orthopaedic Surgery

## 2021-04-18 VITALS — Ht 70.0 in | Wt 150.0 lb

## 2021-04-18 DIAGNOSIS — G8929 Other chronic pain: Secondary | ICD-10-CM | POA: Diagnosis not present

## 2021-04-18 DIAGNOSIS — M25511 Pain in right shoulder: Secondary | ICD-10-CM

## 2021-04-18 DIAGNOSIS — M7541 Impingement syndrome of right shoulder: Secondary | ICD-10-CM | POA: Diagnosis not present

## 2021-04-18 MED ORDER — BUPIVACAINE HCL 0.25 % IJ SOLN
2.0000 mL | INTRAMUSCULAR | Status: AC | PRN
  Administered 2021-04-18: 2 mL via INTRA_ARTICULAR
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
## 2021-04-20 NOTE — Telephone Encounter (Signed)
Reviewed labs completed 04/04/2021 with PCP.  Hemoglobin stable/improved to 11.5 from 10.6, 29-month ago.  Ferritin 33, iron 25 (L), iron saturation 6% (L).  Vitamin B12 and folate within normal limits. Patient was already made aware of results. PCP is planning to repeat labs in 3 months. Considering referral to hematology for IV iron if no significant improvement which would be reasonable.   Erline Levine, can we get patient arranged for a follow-up with Dr. Gala Romney, next available? Not sure if he has any openings in August.

## 2021-04-21 ENCOUNTER — Other Ambulatory Visit: Payer: Self-pay | Admitting: Gastroenterology

## 2021-05-09 ENCOUNTER — Other Ambulatory Visit: Payer: Self-pay

## 2021-05-09 DIAGNOSIS — G8929 Other chronic pain: Secondary | ICD-10-CM

## 2021-05-09 DIAGNOSIS — M25511 Pain in right shoulder: Secondary | ICD-10-CM

## 2021-05-11 ENCOUNTER — Ambulatory Visit: Payer: 59 | Admitting: Internal Medicine

## 2021-05-16 ENCOUNTER — Other Ambulatory Visit: Payer: Self-pay | Admitting: Gastroenterology

## 2021-05-21 ENCOUNTER — Other Ambulatory Visit: Payer: Self-pay | Admitting: Family

## 2021-05-21 DIAGNOSIS — I6930 Unspecified sequelae of cerebral infarction: Secondary | ICD-10-CM

## 2021-05-22 ENCOUNTER — Ambulatory Visit (INDEPENDENT_AMBULATORY_CARE_PROVIDER_SITE_OTHER): Payer: 59 | Admitting: Internal Medicine

## 2021-05-22 ENCOUNTER — Other Ambulatory Visit: Payer: Self-pay | Admitting: Internal Medicine

## 2021-05-22 ENCOUNTER — Telehealth: Payer: Self-pay | Admitting: Internal Medicine

## 2021-05-22 ENCOUNTER — Other Ambulatory Visit: Payer: Self-pay

## 2021-05-22 ENCOUNTER — Encounter: Payer: Self-pay | Admitting: Internal Medicine

## 2021-05-22 VITALS — BP 130/84 | HR 83 | Temp 97.8°F | Ht 70.0 in | Wt 153.0 lb

## 2021-05-22 DIAGNOSIS — D509 Iron deficiency anemia, unspecified: Secondary | ICD-10-CM

## 2021-05-22 DIAGNOSIS — K912 Postsurgical malabsorption, not elsewhere classified: Secondary | ICD-10-CM | POA: Diagnosis not present

## 2021-05-22 DIAGNOSIS — D508 Other iron deficiency anemias: Secondary | ICD-10-CM | POA: Diagnosis not present

## 2021-05-22 DIAGNOSIS — K529 Noninfective gastroenteritis and colitis, unspecified: Secondary | ICD-10-CM

## 2021-05-22 DIAGNOSIS — E538 Deficiency of other specified B group vitamins: Secondary | ICD-10-CM | POA: Diagnosis not present

## 2021-05-22 DIAGNOSIS — D649 Anemia, unspecified: Secondary | ICD-10-CM

## 2021-05-22 MED ORDER — LOPERAMIDE HCL 2 MG PO CAPS: 2.0000 mg | ORAL_CAPSULE | ORAL | 3 refills | Status: AC | PRN

## 2021-05-22 NOTE — Progress Notes (Signed)
Primary Care Physician:  Sharion Balloon, FNP Primary Gastroenterologist:  Dr. Gala Romney  Pre-Procedure History & Physical: HPI:  [name] is a 60 y.o. 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   Capsule study demonstrated small bowel erosions.  Nothing significant otherwise.  Patient states rectal bleeding has ceased.  Also diarrhea settled down taking upwards of 8 loperamide tablets daily.  Saw Dr. Grandville Silos.  Did not feel he needed anything from a surgical standpoint.  He is gained 10 pounds since his last office visit here.  Stool frequency is diminished to about 4 BMs daily.  He feels he is now consuming about 1400 cal daily.  He continues on iron supplementation.  Continues on monthly B B12 injections.  His at home meter suggest she has a hemoglobin of 13.0 this morning. Past Medical History:  Diagnosis Date   Anemia    History of kidney stones    Hypertension    Low testosterone    Stroke [facility]) 2020    Past Surgical History:  Procedure Laterality Date   AGILE CAPSULE N/A 01/29/2021   Procedure: AGILE CAPSULE;  Surgeon: Daneil Dolin, MD;  [facility];  Service: Endoscopy;  Laterality: N/A;  [id number]   APPLICATION OF WOUND VAC  07/29/2019   Procedure: Application Of Wound Vac;  Surgeon: Georganna Skeans, MD;  Location: [place];  Service: General;;   APPLICATION OF WOUND VAC  07/31/2019   Procedure: Application Of Wound Vac;  Surgeon: Jesusita Oka, MD;  Location: [place];  Service: General;;   BIOPSY  09/29/2020   Procedure: BIOPSY;  Surgeon: Rogene Houston, MD;  [facility];  Service: Endoscopy;;   BIOPSY  11/12/2020   Procedure: BIOPSY;  Surgeon: Eloise Harman, DO;  [facility];  Service: Endoscopy;;  gastric   bone spur surgery Right    BOWEL RESECTION N/A 07/28/2019   Procedure:  Small Bowel Resection extended illeosintectomy;  Surgeon: Myran Boston, MD;  Location: [place];  Service: General;  Laterality: N/A;   CLOSED REDUCTION WRIST FRACTURE Left 07/28/2019   Procedure: Closed Reduction Wrist;  Surgeon: Meredith Pel, MD;  Location: [place];  Service: Orthopedics;  Laterality: Left;   COLOSTOMY REVERSAL N/A 05/23/2020   Procedure: END ILEOSTOMY REVERSAL;  Surgeon: Jesusita Oka, MD;  Location: [place];  Service: General;  Laterality: N/A;   ESOPHAGOGASTRODUODENOSCOPY (EGD) WITH PROPOFOL N/A 11/12/2020   Procedure: ESOPHAGOGASTRODUODENOSCOPY (EGD) WITH PROPOFOL;  Surgeon: Eloise Harman, DO;  [facility];  Service: Endoscopy;  Laterality: N/A;   EXTRACORPOREAL SHOCK WAVE LITHOTRIPSY Left 04/16/2018   Procedure: LEFT EXTRACORPOREAL SHOCK WAVE LITHOTRIPSY (ESWL);  Surgeon: Cleon Gustin, MD;  [facility];  Service: Urology;  Laterality: Left;   EYE SURGERY     FEMUR IM NAIL Right 07/28/2019   Procedure: INTRAMEDULLARY (IM) NAIL FEMORAL;  Surgeon: Meredith Pel, MD;  Location: [place];  Service: Orthopedics;  Laterality: Right;   FLEXIBLE SIGMOIDOSCOPY N/A 02/01/2020   Procedure: FLEXIBLE SIGMOIDOSCOPY;  Surgeon: Jesusita Oka, MD;  [facility];  Service: General;  Laterality: N/A;   FLEXIBLE SIGMOIDOSCOPY N/A 09/29/2020   Procedure: FLEXIBLE SIGMOIDOSCOPY;  Surgeon: Rogene Houston, MD;  [facility];  Service: Endoscopy;  Laterality: N/A;  had very short colon due to surgery   GIVENS CAPSULE STUDY  N/A 03/07/2021   Procedure: GIVENS CAPSULE STUDY;  Surgeon: Daneil Dolin, MD;  [facility];  Service: Endoscopy;  Laterality: N/A;  7:30am   IRRIGATION AND DEBRIDEMENT KNEE Left 07/28/2019   Procedure: Irrigation And Debridement  Left Knee;  Surgeon: Meredith Pel, MD;  Location: [place];  Service: Orthopedics;  Laterality: Left;   KNEE CLOSED REDUCTION Right 09/10/2019   Procedure: CLOSED MANIPULATION KNEE;   Surgeon: Shona Needles, MD;  Location: [place];  Service: Orthopedics;  Laterality: Right;   LAPAROTOMY N/A 07/29/2019   Procedure: EXPLORATORY LAPAROTOMY, ileocecectomy;  Surgeon: Georganna Skeans, MD;  Location: [place];  Service: General;  Laterality: N/A;   LAPAROTOMY N/A 07/28/2019   Procedure: EXPLORATORY LAPAROTOMY WITH MESENTERIC REPAIR;  Surgeon: Quaid Boston, MD;  Location: [place];  Service: General;  Laterality: N/A;   LAPAROTOMY N/A 07/31/2019   Procedure: RE-EXPLORATORY LAPAROTOMY, RESECTION OF TRANSVERSE, LEFT, AND SIGMOID COLON, CREATION OF ILEOSTOMY,  PRIMARY FASCIAL CLOSURE;  Surgeon: Jesusita Oka, MD;  Location: [place];  Service: General;  Laterality: N/A;   LAPAROTOMY N/A 05/23/2020   Procedure: EXPLORATORY LAPAROTOMY;  Surgeon: Jesusita Oka, MD;  Location: [place];  Service: General;  Laterality: N/A;   LITHOTRIPSY     ORIF ANKLE FRACTURE Right 08/04/2019   Procedure: Open Reduction Internal Fixation (Orif) Ankle Fracture;  Surgeon: Shona Needles, MD;  Location: [place];  Service: Orthopedics;  Laterality: Right;   ORIF CLAVICULAR FRACTURE Left 08/04/2019   Procedure: OPEN REDUCTION INTERNAL FIXATION (ORIF) CLAVICULAR FRACTURE;  Surgeon: Shona Needles, MD;  Location: [place];  Service: Orthopedics;  Laterality: Left;   ORIF WRIST FRACTURE Left 08/04/2019   Procedure: OPEN REDUCTION INTERNAL FIXATION (ORIF) WRIST FRACTURE;  Surgeon: Shona Needles, MD;  Location: [place];  Service: Orthopedics;  Laterality: Left;    Prior to Admission medications   Medication Sig Start Date End Date Taking? Authorizing Provider  amitriptyline (ELAVIL) 10 MG tablet TAKE 1 TABLET BY MOUTH EVERYDAY AT BEDTIME 03/02/21  Yes Raulkar, Clide Deutscher, MD  amLODipine (NORVASC) 5 MG tablet TAKE 1 TABLET (5 MG TOTAL) BY MOUTH DAILY. 04/02/21  Yes Hawks, Alyse Low A, FNP  atorvastatin (LIPITOR) 10 MG tablet TAKE 1 TABLET BY MOUTH EVERY DAY 05/21/21  Yes Hawks, Christy A, FNP  cyanocobalamin (,VITAMIN B-12,)  1000 MCG/ML injection 1 injection once monthly 11/07/20  Yes Melanie Pellot, Cristopher Estimable, MD  ferrous sulfate 325 (65 FE) MG EC tablet TAKE 1 TABLET BY MOUTH 2 TIMES DAILY WITH A MEAL. 05/17/21  Yes Erenest Rasher, PA-C  loperamide (IMODIUM) 2 MG capsule 8 capsules daily   Yes [provider]  Multiple Vitamin (MULTIVITAMIN WITH MINERALS) TABS tablet Take 1 tablet by mouth daily.   Yes [provider]  SYRINGE-NEEDLE, DISP, 3 ML (BD ECLIPSE SYRINGE) 25G X 1" 3 ML MISC 1 inch by Does not apply route every 30 (thirty) days. Whatever insurance covers for B12 injections 11/10/20  Yes Sharion Balloon, FNP    Allergies as of 05/22/2021   (No Known Allergies)    Family History  Problem Relation Age of Onset   Hypertension Mother    Cancer Father        prostate   Hypertension Father     Social History   Socioeconomic History   Marital status: Married    Spouse name: Not on file   Number of children: Not on file   Years of education: Not on file   Highest  education level: Not on file  Occupational History   Not on file  Tobacco Use   Smoking status: Never   Smokeless tobacco: Never  Vaping Use   Vaping Use: Never used  Substance and Sexual Activity   Alcohol use: No   Drug use: No   Sexual activity: Yes  Other Topics Concern   Not on file  Social History Narrative   ** Merged History Encounter **       Social Determinants of Health   Financial Resource Strain: Not on file  Food Insecurity: Not on file  Transportation Needs: Not on file  Physical Activity: Not on file  Stress: Not on file  Social Connections: Not on file  Intimate Partner Violence: Not on file    Review of Systems: See HPI, otherwise negative ROS  Physical Exam: BP 130/84   Pulse 83   Temp 97.8 F (36.6 C)   Ht '5\' 10"'$  (1.778 m)   Wt 153 lb (69.4 kg)   BMI 21.95 kg/m  General:   Alert,  Wpleasant and cooperative in NAD Skin: Color appears better today.  Eyes:  Sclera clear, no icterus.    Conjunctiva pink. Lungs:  Clear throughout to auscultation.   No wheezes, crackles, or rhonchi. No acute distress. Heart:  Regular rate and rhythm; no murmurs, clicks, rubs,  or gallops. Abdomen: Abdomen is nondistended.  Well-healed surgical scars.  Soft and nontender 72 12-year-old pulses:  Normal pulses noted. Extremities:  Without clubbing or edema.  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  He has secondary iron deficiency anemia.  Small bowel erosions on capsule study.  Clinically, bleeding is ceased.  Stool frequency is diminished with pushing them loperamide 8 tablets daily.  Very pleased to see his progress in particular including 10 pounds of weight gain.  Believe we are seeing the residual small bowel adapting after resection.  History of iron deficiency anemia in part due to malabsorption and to a lesser degree GI bleeding.  Recommendations:  Continue to slowly increase caloric intake with a goal of getting at least 2000 cal in per day  Continue loperamide 2 mg tablets up to 8 daily (dispense 720 with 3 refills)  Continue multivitamin tablet daily  Continue iron sulfate daily  Continue B12 injections.  We can hold off on going to the hematologist for now  CBC, iron, iron binding capacity, ferritin in 6 weeks  Office visit with me in 3 months   Notice: This dictation was prepared with Dragon dictation along with smaller phrase technology. Any transcriptional errors that result from this process are unintentional and may not be corrected upon review.

## 2021-05-22 NOTE — Patient Instructions (Signed)
Congratulations on your progress!  Continue to slowly increase your caloric intake with a goal of getting at least 2000 cal in per day  Continue loperamide 2 mg tablets up to 8 daily (dispense 720 with 3 refills)  Continue multivitamin tablet daily  Continue iron sulfate daily  We can hold off on going to the hematologist or blood specialist for now  CBC, iron, iron binding capacity, ferritin in 6 weeks  Office visit with me in 3 months

## 2021-05-22 NOTE — Telephone Encounter (Signed)
[facility] called stating that they did not feel comfortable doing 90 day supply on loperamide due to it being an opioid, but was ok with doing 30 day supply with 3 refills.

## 2021-05-23 NOTE — Telephone Encounter (Signed)
Noted  

## 2021-05-24 ENCOUNTER — Ambulatory Visit (HOSPITAL_COMMUNITY)
Admission: RE | Admit: 2021-05-24 | Discharge: 2021-05-24 | Disposition: A | Discharge status: Home / Self Care | Payer: 59 | Source: Ambulatory Visit | Attending: Orthopaedic Surgery | Admitting: Orthopaedic Surgery

## 2021-05-24 ENCOUNTER — Other Ambulatory Visit: Payer: Self-pay

## 2021-05-24 DIAGNOSIS — G8929 Other chronic pain: Secondary | ICD-10-CM | POA: Diagnosis present

## 2021-05-24 DIAGNOSIS — M25511 Pain in right shoulder: Secondary | ICD-10-CM | POA: Insufficient documentation

## 2021-05-25 ENCOUNTER — Telehealth: Payer: Self-pay

## 2021-05-25 NOTE — Telephone Encounter (Signed)
Patient would like Dr.Whitfield to call him with his MRI results. It was an MRI of his right shoulder done at [facility]. Results are in the computer. Please call patient at [phone].

## 2021-05-25 NOTE — Telephone Encounter (Signed)
Called with results.

## 2021-05-28 NOTE — Progress Notes (Signed)
Pt is not scheduled to have done until 07/03/21.

## 2021-06-01 ENCOUNTER — Other Ambulatory Visit: Payer: Self-pay | Admitting: Family

## 2021-06-07 ENCOUNTER — Other Ambulatory Visit: Payer: Self-pay | Admitting: Family

## 2021-06-27 ENCOUNTER — Encounter: Payer: Self-pay | Admitting: Family

## 2021-06-27 ENCOUNTER — Ambulatory Visit: Payer: 59 | Admitting: Family

## 2021-06-27 ENCOUNTER — Other Ambulatory Visit: Payer: Self-pay

## 2021-06-27 VITALS — BP 133/87 | HR 93 | Temp 98.1°F | Ht 70.0 in | Wt 154.2 lb

## 2021-06-27 DIAGNOSIS — N401 Enlarged prostate with lower urinary tract symptoms: Secondary | ICD-10-CM

## 2021-06-27 DIAGNOSIS — E785 Hyperlipidemia, unspecified: Secondary | ICD-10-CM

## 2021-06-27 DIAGNOSIS — D509 Iron deficiency anemia, unspecified: Secondary | ICD-10-CM

## 2021-06-27 DIAGNOSIS — I10 Essential (primary) hypertension: Secondary | ICD-10-CM

## 2021-06-27 DIAGNOSIS — R351 Nocturia: Secondary | ICD-10-CM

## 2021-06-27 DIAGNOSIS — E538 Deficiency of other specified B group vitamins: Secondary | ICD-10-CM

## 2021-06-27 DIAGNOSIS — Z0001 Encounter for general adult medical examination with abnormal findings: Secondary | ICD-10-CM

## 2021-06-27 DIAGNOSIS — I6930 Unspecified sequelae of cerebral infarction: Secondary | ICD-10-CM

## 2021-06-27 DIAGNOSIS — Z0000 Encounter for general adult medical examination without abnormal findings: Secondary | ICD-10-CM

## 2021-06-27 DIAGNOSIS — F418 Other specified anxiety disorders: Secondary | ICD-10-CM
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
## 2021-06-27 NOTE — Patient Instructions (Signed)
Health Maintenance, Male Adopting a healthy lifestyle and getting preventive care are important in promoting health and wellness. Ask your health care provider about: The right schedule for you to have regular tests and exams. Things you can do on your own to prevent diseases and keep yourself healthy. What should I know about diet, weight, and exercise? Eat a healthy diet  Eat a diet that includes plenty of vegetables, fruits, low-fat dairy products, and lean protein. Do not eat a lot of foods that are high in solid fats, added sugars, or sodium. Maintain a healthy weight Body mass index (BMI) is a measurement that can be used to identify possible weight problems. It estimates body fat based on height and weight. Your health care provider can help determine your BMI and help you achieve or maintain a healthy weight. Get regular exercise Get regular exercise. This is one of the most important things you can do for your health. Most adults should: Exercise for at least 150 minutes each week. The exercise should increase your heart rate and make you sweat (moderate-intensity exercise). Do strengthening exercises at least twice a week. This is in addition to the moderate-intensity exercise. Spend less time sitting. Even light physical activity can be beneficial. Watch cholesterol and blood lipids Have your blood tested for lipids and cholesterol at 61 years of age, then have this test every 5 years. You may need to have your cholesterol levels checked more often if: Your lipid or cholesterol levels are high. You are older than 61 years of age. You are at high risk for heart disease. What should I know about cancer screening? Many types of cancers can be detected early and may often be prevented. Depending on your health history and family history, you may need to have cancer screening at various ages. This may include screening for: Colorectal cancer. Prostate cancer. Skin cancer. Lung  cancer. What should I know about heart disease, diabetes, and high blood pressure? Blood pressure and heart disease High blood pressure causes heart disease and increases the risk of stroke. This is more likely to develop in people who have high blood pressure readings, are of African descent, or are overweight. Talk with your health care provider about your target blood pressure readings. Have your blood pressure checked: Every 3-5 years if you are 18-39 years of age. Every year if you are 40 years old or older. If you are between the ages of 65 and 75 and are a current or former smoker, ask your health care provider if you should have a one-time screening for abdominal aortic aneurysm (AAA). Diabetes Have regular diabetes screenings. This checks your fasting blood sugar level. Have the screening done: Once every three years after age 45 if you are at a normal weight and have a low risk for diabetes. More often and at a younger age if you are overweight or have a high risk for diabetes. What should I know about preventing infection? Hepatitis B If you have a higher risk for hepatitis B, you should be screened for this virus. Talk with your health care provider to find out if you are at risk for hepatitis B infection. Hepatitis C Blood testing is recommended for: Everyone born from 1945 through 1965. Anyone with known risk factors for hepatitis C. Sexually transmitted infections (STIs) You should be screened each year for STIs, including gonorrhea and chlamydia, if: You are sexually active and are younger than 61 years of age. You are older than 61 years   of age and your health care provider tells you that you are at risk for this type of infection. Your sexual activity has changed since you were last screened, and you are at increased risk for chlamydia or gonorrhea. Ask your health care provider if you are at risk. Ask your health care provider about whether you are at high risk for HIV.  Your health care provider may recommend a prescription medicine to help prevent HIV infection. If you choose to take medicine to prevent HIV, you should first get tested for HIV. You should then be tested every 3 months for as long as you are taking the medicine. Follow these instructions at home: Lifestyle Do not use any products that contain nicotine or tobacco, such as cigarettes, e-cigarettes, and chewing tobacco. If you need help quitting, ask your health care provider. Do not use street drugs. Do not share needles. Ask your health care provider for help if you need support or information about quitting drugs. Alcohol use Do not drink alcohol if your health care provider tells you not to drink. If you drink alcohol: Limit how much you have to 0-2 drinks a day. Be aware of how much alcohol is in your drink. In the [place] one drink equals one 12 oz bottle of beer (355 mL), one 5 oz glass of wine (148 mL), or one 1 oz glass of hard liquor (44 mL). General instructions Schedule regular health, dental, and eye exams. Stay current with your vaccines. Tell your health care provider if: You often feel depressed. You have ever been abused or do not feel safe at home. Summary Adopting a healthy lifestyle and getting preventive care are important in promoting health and wellness. Follow your health care provider's instructions about healthy diet, exercising, and getting tested or screened for diseases. Follow your health care provider's instructions on monitoring your cholesterol and blood pressure. This information is not intended to replace advice given to you by your health care provider. Make sure you discuss any questions you have with your health care provider. Document Revised: 12/01/2020 Document Reviewed: 09/16/2018 Elsevier Patient Education  2022 [employer]  

## 2021-06-28 LAB — CBC WITH DIFFERENTIAL/PLATELET
Basophils Absolute: 0.1 10*3/uL (ref 0.0–0.2)
Basos: 1 %
EOS (ABSOLUTE): 0.2 10*3/uL (ref 0.0–0.4)
Eos: 4 %
Hematocrit: 35.4 % — ABNORMAL LOW (ref 37.5–51.0)
Hemoglobin: 11.4 g/dL — ABNORMAL LOW (ref 13.0–17.7)
Immature Grans (Abs): 0 10*3/uL (ref 0.0–0.1)
Immature Granulocytes: 0 %
Lymphocytes Absolute: 1.6 10*3/uL (ref 0.7–3.1)
Lymphs: 25 %
MCH: 31.9 pg (ref 26.6–33.0)
MCHC: 32.2 g/dL (ref 31.5–35.7)
MCV: 99 fL — ABNORMAL HIGH (ref 79–97)
Monocytes Absolute: 0.7 10*3/uL (ref 0.1–0.9)
Monocytes: 11 %
Neutrophils Absolute: 3.8 10*3/uL (ref 1.4–7.0)
Neutrophils: 59 %
Platelets: 421 10*3/uL (ref 150–450)
RBC: 3.57 x10E6/uL — ABNORMAL LOW (ref 4.14–5.80)
RDW: 13 % (ref 11.6–15.4)
WBC: 6.4 10*3/uL (ref 3.4–10.8)

## 2021-06-28 LAB — LIPID PANEL
Chol/HDL Ratio: 2.3 ratio (ref 0.0–5.0)
Cholesterol, Total: 122 mg/dL (ref 100–199)
HDL: 54 mg/dL (ref >39)
LDL Chol Calc (NIH): 24 mg/dL (ref 0–99)
Triglycerides: 308 mg/dL — ABNORMAL HIGH (ref 0–149)
VLDL Cholesterol Cal: 44 mg/dL — ABNORMAL HIGH (ref 5–40)

## 2021-06-28 LAB — IRON,TIBC AND FERRITIN PANEL
Ferritin: 43 ng/mL (ref 30–400)
Iron Saturation: 81 % (ref 15–55)
Iron: 309 ug/dL (ref 38–169)
Total Iron Binding Capacity: 381 ug/dL (ref 250–450)
UIBC: 72 ug/dL — ABNORMAL LOW (ref 111–343)

## 2021-06-28 LAB — CMP14+EGFR
ALT: 17 IU/L (ref 0–44)
AST: 22 IU/L (ref 0–40)
Albumin/Globulin Ratio: 1.5 (ref 1.2–2.2)
Albumin: 4.1 g/dL (ref 3.8–4.8)
Alkaline Phosphatase: 90 IU/L (ref 44–121)
BUN/Creatinine Ratio: 13 (ref 10–24)
BUN: 12 mg/dL (ref 8–27)
Bilirubin Total: <0.2 mg/dL (ref 0.0–1.2)
CO2: 26 mmol/L (ref 20–29)
Calcium: 9.4 mg/dL (ref 8.6–10.2)
Chloride: 105 mmol/L (ref 96–106)
Creatinine, Ser: 0.96 mg/dL (ref 0.76–1.27)
Globulin, Total: 2.8 g/dL (ref 1.5–4.5)
Glucose: 109 mg/dL — ABNORMAL HIGH (ref 65–99)
Potassium: 4.3 mmol/L (ref 3.5–5.2)
Sodium: 144 mmol/L (ref 134–144)
Total Protein: 6.9 g/dL (ref 6.0–8.5)
eGFR: 90 mL/min/{1.73_m2} (ref >59)

## 2021-06-28 LAB — PSA, TOTAL AND FREE
PSA, Free Pct: 27.5 %
PSA, Free: 0.11 ng/mL
Prostate Specific Ag, Serum: 0.4 ng/mL (ref 0.0–4.0)

## 2021-06-28 LAB — VITAMIN B12: Vitamin B-12: 500 pg/mL (ref 232–1245)

## 2021-06-28 LAB — TSH: TSH: 2.01 u[IU]/mL (ref 0.450–4.500)

## 2021-06-29 ENCOUNTER — Other Ambulatory Visit: Payer: Self-pay | Admitting: Internal Medicine

## 2021-06-29 DIAGNOSIS — D649 Anemia, unspecified: Secondary | ICD-10-CM

## 2021-07-04 ENCOUNTER — Other Ambulatory Visit: Payer: Self-pay | Admitting: Internal Medicine

## 2021-07-04 ENCOUNTER — Telehealth: Payer: Self-pay | Admitting: Internal Medicine

## 2021-07-04 DIAGNOSIS — D509 Iron deficiency anemia, unspecified: Secondary | ICD-10-CM

## 2021-07-04 NOTE — Telephone Encounter (Signed)
Pt called stating that he was taking two 325 mg iron pills daily until he was given his lab results on Friday. Pt states that he believes that is why his iron levels was so high. Pt was taking them because he was worried about his hemoglobin level being so low. Pt has since stopped taking them, but is wondering if you can have his hemoglobin checked again when he goes to have his iron checked on Monday.

## 2021-07-04 NOTE — Telephone Encounter (Signed)
Pt was made aware and lab was added on. Pt will have both labs done on 07/09/2021.

## 2021-07-05 NOTE — Progress Notes (Signed)
Will close out this note  Cleotis Nipper, M.D. Pager [phone] If no answer or after 5 PM call [phone]

## 2021-07-10 LAB — IRON,TIBC AND FERRITIN PANEL
Ferritin: 16 ng/mL — ABNORMAL LOW (ref 30–400)
Iron Saturation: 20 % (ref 15–55)
Iron: 85 ug/dL (ref 38–169)
Total Iron Binding Capacity: 423 ug/dL (ref 250–450)
UIBC: 338 ug/dL (ref 111–343)

## 2021-07-27 ENCOUNTER — Ambulatory Visit (INDEPENDENT_AMBULATORY_CARE_PROVIDER_SITE_OTHER): Payer: 59 | Admitting: *Deleted

## 2021-07-27 DIAGNOSIS — Z23 Encounter for immunization: Secondary | ICD-10-CM | POA: Diagnosis not present

## 2021-07-30 ENCOUNTER — Telehealth: Payer: Self-pay | Admitting: Adult Health

## 2021-07-30 NOTE — Telephone Encounter (Signed)
I called the pt. He sts his disability is coming up for review on 08/28/21, pt wanted to move his appt up a few days so the deadline is not cutting it close.  Appt as been moved to 08/07/2021.

## 2021-07-30 NOTE — Telephone Encounter (Signed)
[name] called states her husband is needing some paper work filled out, however the deadline is 08/26/2021. They are needing a sooner appt before they can receive the paper work. Pt is requesting a call back.

## 2021-08-07 ENCOUNTER — Ambulatory Visit (INDEPENDENT_AMBULATORY_CARE_PROVIDER_SITE_OTHER): Payer: 59 | Admitting: Adult Health

## 2021-08-07 ENCOUNTER — Encounter: Payer: Self-pay | Admitting: Adult Health

## 2021-08-07 VITALS — BP 149/91 | HR 93 | Ht 70.0 in | Wt 153.0 lb

## 2021-08-07 DIAGNOSIS — D649 Anemia, unspecified: Secondary | ICD-10-CM | POA: Diagnosis not present

## 2021-08-07 DIAGNOSIS — I6350 Cerebral infarction due to unspecified occlusion or stenosis of unspecified cerebral artery: Secondary | ICD-10-CM

## 2021-08-07 DIAGNOSIS — R202 Paresthesia of skin: Secondary | ICD-10-CM | POA: Diagnosis not present

## 2021-08-07 DIAGNOSIS — I10 Essential (primary) hypertension: Secondary | ICD-10-CM

## 2021-08-07 DIAGNOSIS — E785 Hyperlipidemia, unspecified: Secondary | ICD-10-CM

## 2021-08-07 DIAGNOSIS — K912 Postsurgical malabsorption, not elsewhere classified: Secondary | ICD-10-CM

## 2021-08-07 MED ORDER — AMITRIPTYLINE HCL 25 MG PO TABS: 25.0000 mg | ORAL_TABLET | Freq: Every day | ORAL | 3 refills | Status: DC

## 2021-08-07 NOTE — Telephone Encounter (Signed)
Pt advised per Janett Billow last disability pw was completed by PCP and would need to continue to be filled out by PCP. Pt sts his HR rep stated neurology would need to fill out a portion of the form as well.  Pt sts he did not have the forms with him today but we should be receiving a fax at a later date.   Pt advised once we received the forms we would review, but unilaminately the PCP might need to fill out the forms. Pt will keep 6 month f/u as scheduled

## 2021-08-07 NOTE — Patient Instructions (Signed)
Continue atorvastatin 10mg  daily  for secondary stroke prevention  Continue to follow up with PCP regarding cholesterol and blood pressure management  Maintain strict control of hypertension with blood pressure goal below 130/90 and cholesterol with LDL cholesterol (bad cholesterol) goal below 70 mg/dL.   Increase amitriptyline to 25mg  nightly      Followup in the future with me in 6 months or call earlier if needed       Thank you for coming to see [place] at [facility]. I hope we have been able to provide you high quality care today.  You may receive a patient satisfaction survey over the next few weeks. We would appreciate your feedback and comments so that we may continue to improve ourselves and the health of our patients.
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
## 2021-08-08 DIAGNOSIS — Z0289 Encounter for other administrative examinations: Secondary | ICD-10-CM

## 2021-08-08 LAB — HEMOGLOBIN AND HEMATOCRIT, BLOOD
Hematocrit: 35.4 % — ABNORMAL LOW (ref 37.5–51.0)
Hemoglobin: 11.3 g/dL — ABNORMAL LOW (ref 13.0–17.7)

## 2021-08-14 ENCOUNTER — Encounter: Payer: Self-pay | Admitting: Internal Medicine

## 2021-08-16 ENCOUNTER — Other Ambulatory Visit: Payer: Self-pay | Admitting: Gastroenterology

## 2021-08-18 ENCOUNTER — Other Ambulatory Visit: Payer: Self-pay | Admitting: Physical Medicine and Rehabilitation

## 2021-08-20 ENCOUNTER — Other Ambulatory Visit: Payer: Self-pay | Admitting: Family

## 2021-08-20 ENCOUNTER — Ambulatory Visit: Payer: 59 | Admitting: Adult Health

## 2021-08-20 DIAGNOSIS — I10 Essential (primary) hypertension: Secondary | ICD-10-CM

## 2021-08-20 NOTE — Telephone Encounter (Signed)
Pharmacy sent a request for Amitriptyline. Patient has not been seen since 02/2021, but has an appointment in January 2023. Would you like to refill?

## 2021-08-29 ENCOUNTER — Other Ambulatory Visit: Payer: Self-pay | Admitting: Internal Medicine

## 2021-09-10 ENCOUNTER — Telehealth: Payer: Self-pay

## 2021-09-10 ENCOUNTER — Telehealth: Payer: Self-pay | Admitting: Family

## 2021-09-10 NOTE — Telephone Encounter (Signed)
Joy called from [facility] stating that regarding pts disability claims, a peer to peer talk is needed with Evelina Dun and Dr Barb Merino.  Can contact Joy at [phone] ext [phone]

## 2021-09-10 NOTE — Telephone Encounter (Signed)
Pt called office, needs to reschedule appt with Dr. Gala Romney that was for tomorrow. He only wants to see Dr. Gala Romney. Cancelled appt for tomorrow.  Erline Levine, please reschedule OV with Dr. Gala Romney when his January schedule is available.

## 2021-09-11 ENCOUNTER — Ambulatory Visit: Payer: 59 | Admitting: Internal Medicine

## 2021-09-13 NOTE — Telephone Encounter (Signed)
Pt scheduled to see Dr. Gala Romney 09/18/21.

## 2021-09-18 ENCOUNTER — Other Ambulatory Visit: Payer: Self-pay

## 2021-09-18 ENCOUNTER — Ambulatory Visit (INDEPENDENT_AMBULATORY_CARE_PROVIDER_SITE_OTHER): Payer: 59 | Admitting: Internal Medicine

## 2021-09-18 ENCOUNTER — Encounter: Payer: Self-pay | Admitting: Internal Medicine

## 2021-09-18 VITALS — BP 132/83 | HR 90 | Temp 97.7°F | Ht 70.0 in | Wt 159.6 lb

## 2021-09-18 DIAGNOSIS — D509 Iron deficiency anemia, unspecified: Secondary | ICD-10-CM | POA: Diagnosis not present

## 2021-09-18 DIAGNOSIS — K912 Postsurgical malabsorption, not elsewhere classified: Secondary | ICD-10-CM | POA: Diagnosis not present

## 2021-09-18 DIAGNOSIS — K529 Noninfective gastroenteritis and colitis, unspecified: Secondary | ICD-10-CM | POA: Diagnosis not present

## 2021-09-18 NOTE — Patient Instructions (Signed)
It was good to see you again today!  It is great to see you gaining another 8 pounds  Might consider taking an Imodium each morning when you get up and perhaps 1 at bedtime.  Your bowel function continues to normalize nicely.  Continue iron supplementation daily  Continue a multivitamin daily  Avoid nonsteroidal agents like Aleve and Advil  To get a CBC, ferritin serum iron and IBC just before your next office visit in 4 months  Office visit here in 4 months  Call me if you develop any interim  problems.
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
## 2021-09-20 ENCOUNTER — Telehealth: Payer: 59 | Admitting: Physical Medicine and Rehabilitation

## 2021-10-07 ENCOUNTER — Other Ambulatory Visit: Payer: Self-pay | Admitting: Family

## 2021-10-07 DIAGNOSIS — I6930 Unspecified sequelae of cerebral infarction: Secondary | ICD-10-CM

## 2021-10-10 ENCOUNTER — Ambulatory Visit: Payer: 59 | Admitting: Physical Medicine and Rehabilitation
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
## 2021-10-11 ENCOUNTER — Encounter: Payer: 59 | Attending: Physical Medicine and Rehabilitation | Admitting: Physical Medicine and Rehabilitation

## 2021-10-11 ENCOUNTER — Other Ambulatory Visit: Payer: Self-pay

## 2021-10-11 VITALS — BP 149/84 | HR 94 | Temp 98.1°F | Ht 70.0 in | Wt 144.0 lb

## 2021-10-11 DIAGNOSIS — T07XXXA Unspecified multiple injuries, initial encounter: Secondary | ICD-10-CM | POA: Diagnosis present

## 2021-10-11 DIAGNOSIS — G4700 Insomnia, unspecified: Secondary | ICD-10-CM

## 2021-10-11 DIAGNOSIS — I6330 Cerebral infarction due to thrombosis of unspecified cerebral artery: Secondary | ICD-10-CM | POA: Diagnosis present

## 2021-10-11 NOTE — Patient Instructions (Addendum)
Insomnia: -Try to go outside near sunrise -Get exercise during the day.  -Turn off all devices an hour before bedtime.  -Teas that can benefit: chamomile, valerian root, Brahmi (Bacopa) -Can consider over the counter melatonin, magnesium, and/or L-theanine. Melatonin is an anti-oxidant with multiple health benefits. Magnesium is involved in greater than 300 enzymatic reactions in the body and most of [place] are deficient as our soil is often depleted. There are 7 different types of magnesium- Bioptemizer's is a supplement with all 7 types, and each has unique benefits. Magnesium can also help with constipation and anxiety.  -Pistachios naturally increase the production of melatonin -Cozy Earth bamboo bed sheets are free from toxic chemicals.  -Tart cherry juice or a tart cherry supplement can improve sleep and soreness post-workout  NAC 600mg  BID  HTN: Foods that can help lower blood pressure and provided with a list: 1) citrus foods- high in vitamins and minerals 2) salmon and other fatty fish - reduces inflammation and oxylipins 3) swiss chard (leafy green)- high level of nitrates 4) pumpkin seeds- one of the best natural sources of magnesium 5) Beans and lentils- high in fiber, magnesium, and potassium 6) Berries- high in flavonoids 7) Amaranth (whole grain, can be cooked similarly to rice and oats)- high in magnesium and fiber 8) Pistachios- even more effective at reducing BP than other nuts 9) Carrots- high in phenolic compounds that relax blood vessels and reduce inflammation 10) Celery- contain phthalides that relax tissues of arterial walls 11) Tomatoes- can also improve cholesterol and reduce risk of heart disease 12) Broccoli- good source of magnesium, calcium, and potassium 13) Greek yogurt: high in potassium and calcium 14) Herbs and spices: Celery seed, cilantro, saffron, lemongrass, black cumin, ginseng, cinnamon, cardamom, sweet basil, and ginger 15) Chia and flax seeds- also  help to lower cholesterol and blood sugar 16) Beets- high levels of nitrates that relax blood vessels  17) spinach and bananas- high in potassium  -Provided lise of supplements that can help with hypertension:  1) magnesium: one high quality brand is Bioptemizers since it contains all 7 types of magnesium, otherwise over the counter magnesium gluconate 400mg  is a good option 2) B vitamins 3) vitamin D 4) potassium 5) CoQ10 6) L-arginine 7) Vitamin C 8) Beetroot -Educated that goal BP is 120/80. -Made goal to incorporate some of the above foods into diet.

## 2021-11-01 ENCOUNTER — Other Ambulatory Visit: Payer: Self-pay | Admitting: Internal Medicine

## 2021-12-07 ENCOUNTER — Other Ambulatory Visit: Payer: Self-pay | Admitting: *Deleted

## 2021-12-07 DIAGNOSIS — D509 Iron deficiency anemia, unspecified: Secondary | ICD-10-CM

## 2021-12-10 ENCOUNTER — Encounter: Payer: Self-pay | Admitting: Internal Medicine

## 2021-12-10 ENCOUNTER — Telehealth: Payer: Self-pay | Admitting: Internal Medicine

## 2021-12-10 NOTE — Telephone Encounter (Signed)
Spoke with pt and informed him that lab orders were mailed to him on Friday and that he can take those orders to [facility] and see if they can do them.  ?

## 2021-12-10 NOTE — Telephone Encounter (Signed)
Patient wants to have his labs done at [facility] in [place], can they go in and see the lab order?  Please call the patient and let him know  ?

## 2021-12-13 ENCOUNTER — Other Ambulatory Visit: Payer: Self-pay | Admitting: Family

## 2021-12-13 DIAGNOSIS — I6930 Unspecified sequelae of cerebral infarction: Secondary | ICD-10-CM

## 2021-12-25 ENCOUNTER — Ambulatory Visit: Payer: 59 | Admitting: Family

## 2021-12-25 ENCOUNTER — Encounter: Payer: Self-pay | Admitting: Family

## 2021-12-25 VITALS — BP 122/78 | HR 96 | Temp 98.0°F | Ht 70.0 in | Wt 153.6 lb

## 2021-12-25 DIAGNOSIS — D649 Anemia, unspecified: Secondary | ICD-10-CM

## 2021-12-25 DIAGNOSIS — F418 Other specified anxiety disorders: Secondary | ICD-10-CM

## 2021-12-25 DIAGNOSIS — Z23 Encounter for immunization: Secondary | ICD-10-CM | POA: Diagnosis not present

## 2021-12-25 DIAGNOSIS — N401 Enlarged prostate with lower urinary tract symptoms: Secondary | ICD-10-CM

## 2021-12-25 DIAGNOSIS — N200 Calculus of kidney: Secondary | ICD-10-CM

## 2021-12-25 DIAGNOSIS — E538 Deficiency of other specified B group vitamins: Secondary | ICD-10-CM

## 2021-12-25 DIAGNOSIS — D509 Iron deficiency anemia, unspecified: Secondary | ICD-10-CM

## 2021-12-25 DIAGNOSIS — E785 Hyperlipidemia, unspecified: Secondary | ICD-10-CM | POA: Diagnosis not present

## 2021-12-25 DIAGNOSIS — R351 Nocturia: Secondary | ICD-10-CM

## 2021-12-25 DIAGNOSIS — I6930 Unspecified sequelae of cerebral infarction: Secondary | ICD-10-CM

## 2021-12-25 DIAGNOSIS — I10 Essential (primary) hypertension: Secondary | ICD-10-CM
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
## 2021-12-25 NOTE — Patient Instructions (Signed)

## 2021-12-26 ENCOUNTER — Telehealth: Payer: Self-pay | Admitting: Family Medicine

## 2021-12-26 LAB — CBC WITH DIFFERENTIAL/PLATELET
Basophils Absolute: 0.1 10*3/uL (ref 0.0–0.2)
Basos: 1 %
EOS (ABSOLUTE): 0.1 10*3/uL (ref 0.0–0.4)
Eos: 2 %
Hematocrit: 27.6 % — ABNORMAL LOW (ref 37.5–51.0)
Hemoglobin: 8.9 g/dL — CL (ref 13.0–17.7)
Immature Grans (Abs): 0 10*3/uL (ref 0.0–0.1)
Immature Granulocytes: 0 %
Lymphocytes Absolute: 1.9 10*3/uL (ref 0.7–3.1)
Lymphs: 31 %
MCH: 31.4 pg (ref 26.6–33.0)
MCHC: 32.2 g/dL (ref 31.5–35.7)
MCV: 98 fL — ABNORMAL HIGH (ref 79–97)
Monocytes Absolute: 0.6 10*3/uL (ref 0.1–0.9)
Monocytes: 10 %
Neutrophils Absolute: 3.5 10*3/uL (ref 1.4–7.0)
Neutrophils: 56 %
Platelets: 436 10*3/uL (ref 150–450)
RBC: 2.83 x10E6/uL — ABNORMAL LOW (ref 4.14–5.80)
RDW: 12.7 % (ref 11.6–15.4)
WBC: 6.2 10*3/uL (ref 3.4–10.8)

## 2021-12-26 LAB — IRON,TIBC AND FERRITIN PANEL
Ferritin: 30 ng/mL (ref 30–400)
Iron Saturation: 16 % (ref 15–55)
Iron: 59 ug/dL (ref 38–169)
Total Iron Binding Capacity: 369 ug/dL (ref 250–450)
UIBC: 310 ug/dL (ref 111–343)

## 2021-12-26 LAB — CMP14+EGFR
ALT: 15 IU/L (ref 0–44)
AST: 17 IU/L (ref 0–40)
Albumin/Globulin Ratio: 1.7 (ref 1.2–2.2)
Albumin: 4 g/dL (ref 3.8–4.8)
Alkaline Phosphatase: 84 IU/L (ref 44–121)
BUN/Creatinine Ratio: 20 (ref 10–24)
BUN: 17 mg/dL (ref 8–27)
Bilirubin Total: <0.2 mg/dL (ref 0.0–1.2)
CO2: 27 mmol/L (ref 20–29)
Calcium: 8.9 mg/dL (ref 8.6–10.2)
Chloride: 102 mmol/L (ref 96–106)
Creatinine, Ser: 0.87 mg/dL (ref 0.76–1.27)
Globulin, Total: 2.4 g/dL (ref 1.5–4.5)
Glucose: 94 mg/dL (ref 70–99)
Potassium: 4.2 mmol/L (ref 3.5–5.2)
Sodium: 138 mmol/L (ref 134–144)
Total Protein: 6.4 g/dL (ref 6.0–8.5)
eGFR: 98 mL/min/{1.73_m2} (ref >59)

## 2021-12-26 NOTE — Telephone Encounter (Signed)
lmtcb

## 2021-12-26 NOTE — Telephone Encounter (Signed)
Uncertain etiology. Is he bleeding?  Recommend FOBT if has not been collected already.  Iron levels look normal.  Recommend B12 and folate testing though, MCV is elevated.  May be cause of anemia.  Make sure he has f/u scheduled with [facility].  Ok to place orders in my name if he wants to come in today to have labs drawn. Otherwise, cc to [facility] for follow up ?

## 2022-01-01 ENCOUNTER — Ambulatory Visit: Payer: 59 | Admitting: Internal Medicine

## 2022-01-01 ENCOUNTER — Other Ambulatory Visit: Payer: Self-pay

## 2022-01-01 ENCOUNTER — Encounter: Payer: Self-pay | Admitting: Internal Medicine

## 2022-01-01 VITALS — BP 118/78 | HR 89 | Temp 97.8°F | Ht 70.0 in | Wt 153.4 lb

## 2022-01-01 DIAGNOSIS — K529 Noninfective gastroenteritis and colitis, unspecified: Secondary | ICD-10-CM | POA: Diagnosis not present

## 2022-01-01 DIAGNOSIS — D509 Iron deficiency anemia, unspecified: Secondary | ICD-10-CM

## 2022-01-01 DIAGNOSIS — D649 Anemia, unspecified: Secondary | ICD-10-CM

## 2022-01-01 DIAGNOSIS — D508 Other iron deficiency anemias: Secondary | ICD-10-CM

## 2022-01-01 DIAGNOSIS — K912 Postsurgical malabsorption, not elsewhere classified: Secondary | ICD-10-CM | POA: Diagnosis not present

## 2022-01-01 DIAGNOSIS — E538 Deficiency of other specified B group vitamins: Secondary | ICD-10-CM

## 2022-01-01 MED ORDER — LOPERAMIDE HCL 2 MG PO CAPS: ORAL_CAPSULE | ORAL | 11 refills | Status: DC

## 2022-01-01 NOTE — Progress Notes (Signed)
? ? ?Primary Care Physician:  Sharion Balloon, FNP ?Primary Gastroenterologist:  Dr. Gala Romney ? ?Pre-Procedure History & Physical: ?HPI:  [name] is a 61 y.o. 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  Recurrent anemia.  Recent hemoglobin assay 8.3.  Ferritin low at 16 but iron /iron binding within normal limits.  Has been on oral iron.  No parenteral supplement.  He is eating well; 4-5 loose bowel movements daily for which he takes up to 8 Imodium's daily.  Prior work-up of heme positive stool included EGD capsule and colonoscopy.  EGD without significant findings, capsule demonstrated erosions ileocolonic edema at anastomosis on colonoscopy but biopsies were normal. ?Patient denies any NSAID ingestion upon review.  Has not had any melena; thinks he may have seen a little blood per rectum on the paper recently. ? ?He has resumed weight lifting he is got a home gym; he is walking up and getting back to his premorbid body frame.  He has had issues with tingling down both lower extremities. ? ?Patient states says he has been checking his hemoglobin at time 4 days ago,  his hemoglobin came up to 9.3 and he tells me this morning his hemoglobin is back up to 12 ? ?He shows me photos of the results on his monitor). ? ?Past Medical History:  ?Diagnosis Date  ? Anemia   ? History of kidney stones   ? Hypertension   ? Low testosterone   ? Stroke [facility]) 2020  ? ? ?Past Surgical History:  ?Procedure Laterality Date  ? AGILE CAPSULE N/A 01/29/2021  ? Procedure: AGILE CAPSULE;  Surgeon: Daneil Dolin, MD;  [facility];  Service: Endoscopy;  Laterality: N/A;  6:07PX  ? APPLICATION OF WOUND VAC  07/29/2019  ? Procedure: Application Of Wound Vac;  Surgeon: Georganna Skeans, MD;  Location: [place];  Service: General;;  ? APPLICATION OF WOUND VAC  07/31/2019  ? Procedure: Application Of Wound Vac;  Surgeon: Jesusita Oka, MD;  Location: [place];  Service: General;;  ?  BIOPSY  09/29/2020  ? Procedure: BIOPSY;  Surgeon: Rogene Houston, MD;  [facility];  Service: Endoscopy;;  ? BIOPSY  11/12/2020  ? Procedure: BIOPSY;  Surgeon: Eloise Harman, DO;  [facility];  Service: Endoscopy;;  gastric  ? bone spur surgery Right   ? BOWEL RESECTION N/A 07/28/2019  ? Procedure: Small Bowel Resection extended illeosintectomy;  Surgeon: Branson Boston, MD;  Location: [employer];  Service: General;  Laterality: N/A;  ? CLOSED REDUCTION WRIST FRACTURE Left 07/28/2019  ? Procedure: Closed Reduction Wrist;  Surgeon: Meredith Pel, MD;  Location: [place];  Service: Orthopedics;  Laterality: Left;  ? COLOSTOMY REVERSAL N/A 05/23/2020  ? Procedure: END ILEOSTOMY REVERSAL;  Surgeon: Jesusita Oka, MD;  Location: [place];  Service: General;  Laterality: N/A;  ? ESOPHAGOGASTRODUODENOSCOPY (EGD) WITH PROPOFOL N/A 11/12/2020  ? Procedure: ESOPHAGOGASTRODUODENOSCOPY (EGD) WITH PROPOFOL;  Surgeon: Eloise Harman, DO;  [facility];  Service: Endoscopy;  Laterality: N/A;  ? EXTRACORPOREAL SHOCK WAVE LITHOTRIPSY Left 04/16/2018  ? Procedure: LEFT EXTRACORPOREAL SHOCK WAVE LITHOTRIPSY (ESWL);  Surgeon: Cleon Gustin, MD;  Location: [facility];  Service: Urology;  Laterality: Left;  ? EYE SURGERY    ? FEMUR IM NAIL Right 07/28/2019  ? Procedure: INTRAMEDULLARY (IM) NAIL FEMORAL;  Surgeon: Meredith Pel, MD;  Location: [place];  Service: Orthopedics;  Laterality: Right;  ?  FLEXIBLE SIGMOIDOSCOPY N/A 02/01/2020  ? Procedure: FLEXIBLE SIGMOIDOSCOPY;  Surgeon: Jesusita Oka, MD;  Location: [facility];  Service: General;  Laterality: N/A;  ? FLEXIBLE SIGMOIDOSCOPY N/A 09/29/2020  ? Procedure: FLEXIBLE SIGMOIDOSCOPY;  Surgeon: Rogene Houston, MD;  [facility];  Service: Endoscopy;  Laterality: N/A;  had very short colon due to surgery  ? GIVENS CAPSULE STUDY N/A 03/07/2021  ? Procedure: GIVENS CAPSULE STUDY;  Surgeon: Daneil Dolin, MD;  [facility];  Service: Endoscopy;  Laterality: N/A;  7:30am  ? IRRIGATION AND DEBRIDEMENT KNEE Left 07/28/2019  ? Procedure: Irrigation And Debridement  Left Knee;  Surgeon: Meredith Pel, MD;  Location: [place];  Service: Orthopedics;  Laterality: Left;  ? KNEE CLOSED REDUCTION Right 09/10/2019  ? Procedure: CLOSED MANIPULATION KNEE;  Surgeon: Shona Needles, MD;  Location: [place];  Service: Orthopedics;  Laterality: Right;  ? LAPAROTOMY N/A 07/29/2019  ? Procedure: EXPLORATORY LAPAROTOMY, ileocecectomy;  Surgeon: Georganna Skeans, MD;  Location: [place];  Service: General;  Laterality: N/A;  ? LAPAROTOMY N/A 07/28/2019  ? Procedure: EXPLORATORY LAPAROTOMY WITH MESENTERIC REPAIR;  Surgeon: Jackson Boston, MD;  Location: [place];  Service: General;  Laterality: N/A;  ? LAPAROTOMY N/A 07/31/2019  ? Procedure: RE-EXPLORATORY LAPAROTOMY, RESECTION OF TRANSVERSE, LEFT, AND SIGMOID COLON, CREATION OF ILEOSTOMY,  PRIMARY FASCIAL CLOSURE;  Surgeon: Jesusita Oka, MD;  Location: [place];  Service: General;  Laterality: N/A;  ? LAPAROTOMY N/A 05/23/2020  ? Procedure: EXPLORATORY LAPAROTOMY;  Surgeon: Jesusita Oka, MD;  Location: [place];  Service: General;  Laterality: N/A;  ? LITHOTRIPSY    ? ORIF ANKLE FRACTURE Right 08/04/2019  ? Procedure: Open Reduction Internal Fixation (Orif) Ankle Fracture;  Surgeon: Shona Needles, MD;  Location: [place];  Service: Orthopedics;  Laterality: Right;  ? ORIF CLAVICULAR FRACTURE Left 08/04/2019  ? Procedure: OPEN REDUCTION INTERNAL FIXATION (ORIF) CLAVICULAR FRACTURE;  Surgeon: Shona Needles, MD;  Location: [place];  Service: Orthopedics;  Laterality: Left;  ? ORIF WRIST FRACTURE Left 08/04/2019  ? Procedure: OPEN REDUCTION INTERNAL FIXATION (ORIF) WRIST FRACTURE;  Surgeon: Shona Needles, MD;  Location: [place];  Service: Orthopedics;  Laterality: Left;  ? ? ?Prior to Admission medications   ?Medication Sig Start Date End Date Taking? Authorizing Provider  ?amLODipine (NORVASC) 5 MG tablet TAKE  1 TABLET (5 MG TOTAL) BY MOUTH DAILY. 08/20/21  Yes Sharion Balloon, FNP  ?atorvastatin (LIPITOR) 10 MG tablet TAKE 1 TABLET BY MOUTH EVERY DAY 12/14/21  Yes Sharion Balloon, FNP  ?cyanocobalamin (,VITAMIN B-12,) 1000 MCG/ML injection 1 INJECTION ONCE MONTHLY STOP ORAL VITAMIN B12 09/02/21  Yes Harper, Kristen S, PA-C  ?diphenhydramine-acetaminophen (TYLENOL PM) 25-500 MG TABS tablet Take 1 tablet by mouth at bedtime.   Yes [provider]  ?ferrous sulfate 325 (65 FE) MG EC tablet TAKE 1 TABLET BY MOUTH 2 TIMES DAILY WITH A MEAL. 08/16/21  Yes Annitta Needs, NP  ?loperamide (IMODIUM) 2 MG capsule TAKE 1 CAPSULE (2 MG TOTAL) BY MOUTH AS NEEDED FOR DIARRHEA OR LOOSE STOOLS (UP TO 4 PER DAY). 11/01/21  Yes Erenest Rasher, PA-C  ?Multiple Vitamin (MULTIVITAMIN WITH MINERALS) TABS tablet Take 1 tablet by mouth daily.   Yes [provider]  ?SYRINGE-NEEDLE, DISP, 3 ML (BD ECLIPSE SYRINGE) 25G X 1" 3 ML MISC 1 inch by Does not apply route every 30 (thirty) days. Whatever insurance covers for B12 injections 11/10/20  Yes Hawks, [place]  A, FNP  ? ? ?Allergies as of 01/01/2022  ? (No Known Allergies)  ? ? ?Family History  ?Problem Relation Age of Onset  ? Hypertension Mother   ? Cancer Father   ?     prostate  ? Hypertension Father   ? ? ?Social History  ? ?Socioeconomic History  ? Marital status: Married  ?  Spouse name: Not on file  ? Number of children: Not on file  ? Years of education: Not on file  ? Highest education level: Not on file  ?Occupational History  ? Not on file  ?Tobacco Use  ? Smoking status: Never  ? Smokeless tobacco: Never  ?Vaping Use  ? Vaping Use: Never used  ?Substance and Sexual Activity  ? Alcohol use: No  ? Drug use: No  ? Sexual activity: Yes  ?  Partners: Female  ?  Comment: spouse  ?Other Topics Concern  ? Not on file  ?Social History Narrative  ? ** Merged History Encounter **  ?    ? ?Social Determinants of Health  ? ?Financial Resource Strain: Not on file  ?Food  Insecurity: Not on file  ?Transportation Needs: Not on file  ?Physical Activity: Not on file  ?Stress: Not on file  ?Social Connections: Not on file  ?Intimate Partner Violence: Not on file  ? ? ?Review of Systems: ?

## 2022-01-01 NOTE — Progress Notes (Signed)
done

## 2022-01-01 NOTE — Patient Instructions (Signed)
It was good to see you again today! ? ?We will repeat a CBC today to see where your hemoglobin stands ? ?We reviewed medications to avoid.  You should continue not to use any NSAIDs like aspirin or ibuprofen. ? ?Continue Imodium up to 8 tablets daily (dispense 240 with 11 refills) ? ?You may need further GI evaluation regarding bleeding. ? ?We will check a CBC and vitamin assay today to see if you are missing any trace elements, etc. ? ?Office visit in 6 weeks ? ?Further recommendations to follow in the near future. ?

## 2022-01-02 ENCOUNTER — Telehealth: Payer: Self-pay

## 2022-01-02 ENCOUNTER — Other Ambulatory Visit: Payer: Self-pay

## 2022-01-02 DIAGNOSIS — R7989 Other specified abnormal findings of blood chemistry: Secondary | ICD-10-CM

## 2022-01-02 NOTE — Telephone Encounter (Signed)
Elizabeth at [facility] called back and said they were unable to add the micronutrients mineral/element panel to the pt's labs that he had done yesterday due to not having enough blood. They have instructed me on how to order it if you would like for the pt to go back to the lab to have it done.  ?

## 2022-01-02 NOTE — Telephone Encounter (Signed)
Lab order for micronutrient mineral/element panel has been faxed to [facility], pt will be going this evening to have drawn.  ?

## 2022-01-03 NOTE — Telephone Encounter (Signed)
This has already been taken care of

## 2022-01-08 ENCOUNTER — Telehealth: Payer: Self-pay | Admitting: Internal Medicine

## 2022-01-08 NOTE — Telephone Encounter (Signed)
Patient returned call

## 2022-01-08 NOTE — Telephone Encounter (Signed)
See lab result note.

## 2022-01-09 ENCOUNTER — Encounter: Payer: Self-pay | Admitting: *Deleted

## 2022-01-09 ENCOUNTER — Telehealth: Payer: Self-pay | Admitting: *Deleted

## 2022-01-09 DIAGNOSIS — H40013 Open angle with borderline findings, low risk, bilateral: Secondary | ICD-10-CM | POA: Diagnosis not present

## 2022-01-09 LAB — CBC WITH DIFFERENTIAL/PLATELET
Absolute Monocytes: 690 cells/uL (ref 200–950)
Basophils Absolute: 57 cells/uL (ref 0–200)
Basophils Relative: 1 %
Eosinophils Absolute: 120 cells/uL (ref 15–500)
Eosinophils Relative: 2.1 %
HCT: 28.4 % — ABNORMAL LOW (ref 38.5–50.0)
Hemoglobin: 9 g/dL — ABNORMAL LOW (ref 13.2–17.1)
Lymphs Abs: 1853 cells/uL (ref 850–3900)
MCH: 31.3 pg (ref 27.0–33.0)
MCHC: 31.7 g/dL — ABNORMAL LOW (ref 32.0–36.0)
MCV: 98.6 fL (ref 80.0–100.0)
MPV: 10.1 fL (ref 7.5–12.5)
Monocytes Relative: 12.1 %
Neutro Abs: 2981 cells/uL (ref 1500–7800)
Neutrophils Relative %: 52.3 %
Platelets: 435 10*3/uL — ABNORMAL HIGH (ref 140–400)
RBC: 2.88 10*6/uL — ABNORMAL LOW (ref 4.20–5.80)
RDW: 12.5 % (ref 11.0–15.0)
Total Lymphocyte: 32.5 %
WBC: 5.7 10*3/uL (ref 3.8–10.8)

## 2022-01-09 LAB — MICRONUTRIENTS, MINERAL/ELEMENT PANEL
CALCIUM: 8.9 mg/dL (ref 8.6–10.3)
CHROMIUM, BLOOD: 0.3 mcg/L (ref <=1.2)
COPPER, PLASMA: 78 ug/dL (ref 70–175)
IRON: 70 ug/dL (ref 50–180)
MAGNESIUM, RBC: 3.7 mg/dL — ABNORMAL LOW (ref 4.0–6.4)
MANGANESE, BLOOD: 12.5 mcg/L (ref 4.2–16.5)
MOLYBDENUM, BLOOD: 1.6 mcg/L (ref <2.2)
SELENIUM, BLOOD: 124 mcg/L (ref 120–200)
ZINC, PLASMA: 71 ug/dL (ref 60–130)

## 2022-01-09 LAB — VITAMIN D 25 HYDROXY (VIT D DEFICIENCY, FRACTURES): Vit D, 25-Hydroxy: 26 ng/mL — ABNORMAL LOW (ref 30–100)

## 2022-01-09 LAB — IRON,TIBC AND FERRITIN PANEL
%SAT: 3 % (calc) — ABNORMAL LOW (ref 20–48)
Ferritin: 12 ng/mL — ABNORMAL LOW (ref 24–380)
Iron: 14 ug/dL — ABNORMAL LOW (ref 50–180)
TIBC: 430 mcg/dL (calc) — ABNORMAL HIGH (ref 250–425)

## 2022-01-09 LAB — VITAMIN A: Vitamin A (Retinoic Acid): 58 ug/dL (ref 38–98)

## 2022-01-09 LAB — VITAMIN E
Gamma-Tocopherol (Vit E): 1.3 mg/L (ref <=4.3)
Vitamin E (Alpha Tocopherol): 14.5 mg/L (ref 5.7–19.9)

## 2022-01-09 LAB — VITAMIN K1, SERUM: Vitamin K: 384 pg/mL (ref 130–1500)

## 2022-01-09 MED ORDER — PEG 3350-KCL-NA BICARB-NACL 420 G PO SOLR: ORAL | 0 refills | Status: DC

## 2022-01-09 NOTE — Telephone Encounter (Signed)
Pt returned call. He has been scheduled for 5/11 at 1:45pm. Aware will mail instructions with pre-op appt. Will send prep rx to pharmacy ? ? ?PA approved via [place]. Auth# [id number], DOS: Feb 14, 2022 - May 15, 2022 ?

## 2022-01-09 NOTE — Addendum Note (Signed)
Addended by: Cheron Every on: 01/09/2022 01:51 PM ? ? Modules accepted: Orders ? ?

## 2022-01-09 NOTE — Telephone Encounter (Signed)
LMOVM to call back to schedule EGD/push enteroscopy w/ ped colonscope/TCS, asa 3, Dr. Gala Romney ?

## 2022-01-14 ENCOUNTER — Telehealth: Payer: Self-pay | Admitting: Internal Medicine

## 2022-01-14 NOTE — Telephone Encounter (Signed)
PA for TCS/EGD/enteroscopy submitted via [facility] website for [facility] [employer]. Case pended. Tracking# [id number]. ?

## 2022-01-14 NOTE — Telephone Encounter (Signed)
PATIENT NEW INSURANCE SCANNED IN  ?

## 2022-01-17 ENCOUNTER — Other Ambulatory Visit: Payer: Self-pay

## 2022-01-17 DIAGNOSIS — D509 Iron deficiency anemia, unspecified: Secondary | ICD-10-CM

## 2022-01-18 ENCOUNTER — Other Ambulatory Visit: Payer: Self-pay

## 2022-01-18 NOTE — Telephone Encounter (Signed)
TCS/EGD/enteroscopy approved. PA# [id number], valid 02/14/22-05/15/22. ?

## 2022-02-06 LAB — HEMOGLOBIN AND HEMATOCRIT, BLOOD
HCT: 35.4 % — ABNORMAL LOW (ref 38.5–50.0)
Hemoglobin: 11.2 g/dL — ABNORMAL LOW (ref 13.2–17.1)

## 2022-02-07 ENCOUNTER — Telehealth: Payer: Self-pay

## 2022-02-07 ENCOUNTER — Other Ambulatory Visit: Payer: Self-pay | Admitting: Gastroenterology

## 2022-02-07 NOTE — Telephone Encounter (Signed)
Advised patient of recommendation and patient still feels like it is unnecessary at this time, transferred patient to schedulers to cancel procedure that is scheduled for next week. Pt states that he will reschedule in a couple months. Pt feels like the nyquil was the cause of his bleed.  ?

## 2022-02-07 NOTE — Telephone Encounter (Signed)
Pt called office to cancel procedure scheduled for 02/14/22. States his hemoglobin has came up and he doesn't want to do the procedure. Endo scheduler informed to cancel procedure. ?

## 2022-02-07 NOTE — Telephone Encounter (Signed)
Pt called stating that he got his lab results from labs that he had drawn yesterday and his hemoglobin is now 11.2. pt also states that there has not been any blood in his stool lately. Pt is thinking that things are moving in the right direction. Pt states that he is scheduled for a colonoscopy next week but feels like it can be postponed for a couple months due to not seeing any blood and the increase in his hemoglobin levels. Please advise.  ?

## 2022-02-11 ENCOUNTER — Encounter (HOSPITAL_COMMUNITY): Payer: 59

## 2022-02-12 ENCOUNTER — Ambulatory Visit: Payer: 59 | Admitting: Internal Medicine

## 2022-02-12 ENCOUNTER — Ambulatory Visit: Payer: Medicare Other | Admitting: Adult Health

## 2022-02-12 ENCOUNTER — Encounter: Payer: Self-pay | Admitting: Adult Health

## 2022-02-12 VITALS — BP 129/74 | HR 84 | Ht 70.0 in | Wt 145.0 lb

## 2022-02-12 DIAGNOSIS — K912 Postsurgical malabsorption, not elsewhere classified: Secondary | ICD-10-CM

## 2022-02-12 DIAGNOSIS — R202 Paresthesia of skin: Secondary | ICD-10-CM

## 2022-02-12 DIAGNOSIS — I6350 Cerebral infarction due to unspecified occlusion or stenosis of unspecified cerebral artery: Secondary | ICD-10-CM | POA: Diagnosis not present

## 2022-02-12 NOTE — Patient Instructions (Addendum)
Continue to follow with GI regarding anemia and stomach issues ? ?Continue atorvastatin '10mg'$  daily  for secondary stroke prevention ? ?Continue to follow up with PCP regarding cholesterol and blood pressure management  ?Maintain strict control of hypertension with blood pressure goal below 130/90 and cholesterol with LDL cholesterol (bad cholesterol) goal below 70 mg/dL.  ? ?Signs of a Stroke? Follow the BEFAST method:  ?Balance Watch for a sudden loss of balance, trouble with coordination or vertigo ?Eyes Is there a sudden loss of vision in one or both eyes? Or double vision?  ?Face: Ask the person to smile. Does one side of the face droop or is it numb?  ?Arms: Ask the person to raise both arms. Does one arm drift downward? Is there weakness or numbness of a leg? ?Speech: Ask the person to repeat a simple phrase. Does the speech sound slurred/strange? Is the person confused ? ?Time: If you observe any of these signs, call 911. ? ? ? ? ? ? ? ? ?Thank you for coming to see [place] at [facility]. I hope we have been able to provide you high quality care today. ? ?You may receive a patient satisfaction survey over the next few weeks. We would appreciate your feedback and comments so that we may continue to improve ourselves and the health of our patients. ? ?
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
## 2022-02-13 ENCOUNTER — Other Ambulatory Visit: Payer: Self-pay | Admitting: Family

## 2022-02-13 DIAGNOSIS — I10 Essential (primary) hypertension: Secondary | ICD-10-CM

## 2022-02-14 ENCOUNTER — Encounter (HOSPITAL_COMMUNITY): Payer: Self-pay

## 2022-02-14 ENCOUNTER — Ambulatory Visit (HOSPITAL_COMMUNITY): Admit: 2022-02-14 | Payer: 59 | Admitting: Internal Medicine

## 2022-02-14 SURGERY — COLONOSCOPY WITH PROPOFOL
Anesthesia: Monitor Anesthesia Care

## 2022-02-19 ENCOUNTER — Ambulatory Visit: Payer: Medicare Other | Admitting: Internal Medicine

## 2022-02-19 ENCOUNTER — Encounter: Payer: Self-pay | Admitting: Internal Medicine

## 2022-02-19 VITALS — BP 110/76 | HR 80 | Temp 97.9°F | Ht 70.0 in | Wt 145.6 lb

## 2022-02-19 DIAGNOSIS — D509 Iron deficiency anemia, unspecified: Secondary | ICD-10-CM

## 2022-02-19 NOTE — Patient Instructions (Signed)
It was good to see you again today! ? ?Continue all of your vitamin supplements without any change in dosing for now ? ?Repeat ferritin, iron and TIBC in 6 weeks ? ?Office visit here in 3 months ?

## 2022-02-19 NOTE — Progress Notes (Signed)
? ? ?Primary Care Physician:  Sharion Balloon, FNP ?Primary Gastroenterologist:  Dr. Gala Romney ? ?Pre-Procedure History & Physical: ?HPI:  GREGOR DERSHEM is a 61 y.o. 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  Recent iron deficiency anemia and GI bleeding.  Previously worked up with EGD/colonoscopy/ capsule - SB.  Only finding was ileocolonic erosions/edema.  No NSAIDs.  Continued iron deficiency anemia and evidence of occult GI bleeding.  He declined to have a repeat EGD with enteroscopy.  He states that he feels a sleep aid, containing Benadryl and acetaminophen as causes iron poor blood;  he stopped taking it and feels better.  He denies NSAIDs.  Recent trace metal analysis revealed low magnesium borderline low zinc and selenium and copper; again, low iron and low vitamin D.  He is now being supplemented;  his home hemoglobin monitor which he showed me indicates his hemoglobin has come up to 12.9.  He continues not to be interested in further evaluation of ongoing GI bleeding at this time. ? ?He is lost 7 pounds since his last visit.  Working out in [employer] -  he does this religiously.  He is riding the exercise bike frequently; he is working outside [place] the yard doing basically what ever he wants to do.  He has good oral intake;  no more than 4 BMs daily.  He is now retired from [employer] after 30+ years of service.  He feels well. ? ?He still has numbness/tingling in his lower extremities. ? ?Past Medical History:  ?Diagnosis Date  ? Anemia   ? History of kidney stones   ? Hypertension   ? Low testosterone   ? Stroke [facility]) 2020  ? ? ?Past Surgical History:  ?Procedure Laterality Date  ? AGILE CAPSULE N/A 01/29/2021  ? Procedure: AGILE CAPSULE;  Surgeon: Daneil Dolin, MD;  [facility];  Service: Endoscopy;  Laterality: N/A;  1:61WR  ? APPLICATION OF WOUND VAC  07/29/2019  ? Procedure: Application Of Wound Vac;  Surgeon: Georganna Skeans, MD;   Location: [place];  Service: General;;  ? APPLICATION OF WOUND VAC  07/31/2019  ? Procedure: Application Of Wound Vac;  Surgeon: Jesusita Oka, MD;  Location: [place];  Service: General;;  ? BIOPSY  09/29/2020  ? Procedure: BIOPSY;  Surgeon: Rogene Houston, MD;  [facility];  Service: Endoscopy;;  ? BIOPSY  11/12/2020  ? Procedure: BIOPSY;  Surgeon: Eloise Harman, DO;  [facility];  Service: Endoscopy;;  gastric  ? bone spur surgery Right   ? BOWEL RESECTION N/A 07/28/2019  ? Procedure: Small Bowel Resection extended illeosintectomy;  Surgeon: Javaun Boston, MD;  Location: [place];  Service: General;  Laterality: N/A;  ? CLOSED REDUCTION WRIST FRACTURE Left 07/28/2019  ? Procedure: Closed Reduction Wrist;  Surgeon: Meredith Pel, MD;  Location: [place];  Service: Orthopedics;  Laterality: Left;  ? COLOSTOMY REVERSAL N/A 05/23/2020  ? Procedure: END ILEOSTOMY REVERSAL;  Surgeon: Jesusita Oka, MD;  Location: [place];  Service: General;  Laterality: N/A;  ? ESOPHAGOGASTRODUODENOSCOPY (EGD) WITH PROPOFOL N/A 11/12/2020  ? Procedure: ESOPHAGOGASTRODUODENOSCOPY (EGD) WITH PROPOFOL;  Surgeon: Eloise Harman, DO;  [facility];  Service: Endoscopy;  Laterality: N/A;  ? EXTRACORPOREAL SHOCK WAVE LITHOTRIPSY Left 04/16/2018  ? Procedure: LEFT EXTRACORPOREAL SHOCK WAVE LITHOTRIPSY (ESWL);  Surgeon: Cleon Gustin, MD;  [facility];  Service: Urology;  Laterality: Left;  ?  EYE SURGERY    ? FEMUR IM NAIL Right 07/28/2019  ? Procedure: INTRAMEDULLARY (IM) NAIL FEMORAL;  Surgeon: Meredith Pel, MD;  Location: [place];  Service: Orthopedics;  Laterality: Right;  ? FLEXIBLE SIGMOIDOSCOPY N/A 02/01/2020  ? Procedure: FLEXIBLE SIGMOIDOSCOPY;  Surgeon: Jesusita Oka, MD;  Location[facility];  Service: General;  Laterality: N/A;  ? FLEXIBLE SIGMOIDOSCOPY N/A 09/29/2020  ? Procedure: FLEXIBLE SIGMOIDOSCOPY;  Surgeon: Rogene Houston, MD;  [facility];  Service:  Endoscopy;  Laterality: N/A;  had very short colon due to surgery  ? GIVENS CAPSULE STUDY N/A 03/07/2021  ? Procedure: GIVENS CAPSULE STUDY;  Surgeon: Daneil Dolin, MD;  [facility];  Service: Endoscopy;  Laterality: N/A;  7:30am  ? IRRIGATION AND DEBRIDEMENT KNEE Left 07/28/2019  ? Procedure: Irrigation And Debridement  Left Knee;  Surgeon: Meredith Pel, MD;  Location: [place];  Service: Orthopedics;  Laterality: Left;  ? KNEE CLOSED REDUCTION Right 09/10/2019  ? Procedure: CLOSED MANIPULATION KNEE;  Surgeon: Shona Needles, MD;  Location: [place];  Service: Orthopedics;  Laterality: Right;  ? LAPAROTOMY N/A 07/29/2019  ? Procedure: EXPLORATORY LAPAROTOMY, ileocecectomy;  Surgeon: Georganna Skeans, MD;  Location: [place];  Service: General;  Laterality: N/A;  ? LAPAROTOMY N/A 07/28/2019  ? Procedure: EXPLORATORY LAPAROTOMY WITH MESENTERIC REPAIR;  Surgeon: Hisashi Boston, MD;  Location: [place];  Service: General;  Laterality: N/A;  ? LAPAROTOMY N/A 07/31/2019  ? Procedure: RE-EXPLORATORY LAPAROTOMY, RESECTION OF TRANSVERSE, LEFT, AND SIGMOID COLON, CREATION OF ILEOSTOMY,  PRIMARY FASCIAL CLOSURE;  Surgeon: Jesusita Oka, MD;  Location: [place];  Service: General;  Laterality: N/A;  ? LAPAROTOMY N/A 05/23/2020  ? Procedure: EXPLORATORY LAPAROTOMY;  Surgeon: Jesusita Oka, MD;  Location: [place];  Service: General;  Laterality: N/A;  ? LITHOTRIPSY    ? ORIF ANKLE FRACTURE Right 08/04/2019  ? Procedure: Open Reduction Internal Fixation (Orif) Ankle Fracture;  Surgeon: Shona Needles, MD;  Location: [place];  Service: Orthopedics;  Laterality: Right;  ? ORIF CLAVICULAR FRACTURE Left 08/04/2019  ? Procedure: OPEN REDUCTION INTERNAL FIXATION (ORIF) CLAVICULAR FRACTURE;  Surgeon: Shona Needles, MD;  Location: [place];  Service: Orthopedics;  Laterality: Left;  ? ORIF WRIST FRACTURE Left 08/04/2019  ? Procedure: OPEN REDUCTION INTERNAL FIXATION (ORIF) WRIST FRACTURE;  Surgeon: Shona Needles, MD;  Location: [place];  Service: Orthopedics;  Laterality: Left;  ? ? ?Prior to Admission medications   ?Medication Sig Start Date End Date Taking? Authorizing Provider  ?amLODipine (NORVASC) 5 MG tablet TAKE 1 TABLET (5 MG TOTAL) BY MOUTH DAILY. 02/13/22  Yes Sharion Balloon, FNP  ?atorvastatin (LIPITOR) 10 MG tablet TAKE 1 TABLET BY MOUTH EVERY DAY 12/14/21  Yes Sharion Balloon, FNP  ?cyanocobalamin (,VITAMIN B-12,) 1000 MCG/ML injection 1 INJECTION ONCE MONTHLY STOP ORAL VITAMIN B12 09/02/21  Yes Harper, Kristen S, PA-C  ?diphenhydramine-acetaminophen (TYLENOL PM) 25-500 MG TABS tablet Take 1 tablet by mouth at bedtime.   Yes [provider]  ?ferrous sulfate 325 (65 FE) MG EC tablet TAKE 1 TABLET BY MOUTH 2 TIMES DAILY WITH A MEAL. 02/11/22  Yes Robley Matassa, Cristopher Estimable, MD  ?loperamide (IMODIUM) 2 MG capsule TAKE 1 CAPSULE (2 MG TOTAL) BY MOUTH AS NEEDED FOR DIARRHEA OR LOOSE STOOLS (UP TO 8 PER DAY). 01/01/22  Yes Kirstie Larsen, Cristopher Estimable, MD  ?Multiple Vitamin (MULTIVITAMIN WITH MINERALS) TABS tablet Take 1 tablet by mouth daily.   Yes [provider]  ?  SYRINGE-NEEDLE, DISP, 3 ML (BD ECLIPSE SYRINGE) 25G X 1" 3 ML MISC 1 inch by Does not apply route every 30 (thirty) days. Whatever insurance covers for B12 injections 11/10/20  Yes Sharion Balloon, FNP  ? ? ?Allergies as of 02/19/2022  ? (No Known Allergies)  ? ? ?Family History  ?Problem Relation Age of Onset  ? Hypertension Mother   ? Cancer Father   ?     prostate  ? Hypertension Father   ? ? ?Social History  ? ?Socioeconomic History  ? Marital status: Married  ?  Spouse name: Not on file  ? Number of children: Not on file  ? Years of education: Not on file  ? Highest education level: Not on file  ?Occupational History  ? Not on file  ?Tobacco Use  ? Smoking status: Never  ? Smokeless tobacco: Never  ?Vaping Use  ? Vaping Use: Never used  ?Substance and Sexual Activity  ? Alcohol use: No  ? Drug use: No  ? Sexual activity: Yes  ?  Partners: Female  ?  Comment: spouse  ?Other  Topics Concern  ? Not on file  ?Social History Narrative  ? ** Merged History Encounter **  ?    ? ?Social Determinants of Health  ? ?Financial Resource Strain: Not on file  ?Food Insecurity: Not on file  ?William Dalton

## 2022-03-11 ENCOUNTER — Other Ambulatory Visit: Payer: Self-pay

## 2022-03-11 DIAGNOSIS — D509 Iron deficiency anemia, unspecified: Secondary | ICD-10-CM

## 2022-03-11 DIAGNOSIS — R7989 Other specified abnormal findings of blood chemistry: Secondary | ICD-10-CM

## 2022-03-28 ENCOUNTER — Other Ambulatory Visit: Payer: Self-pay | Admitting: Family

## 2022-03-28 DIAGNOSIS — I6930 Unspecified sequelae of cerebral infarction: Secondary | ICD-10-CM

## 2022-04-02 DIAGNOSIS — L570 Actinic keratosis: Secondary | ICD-10-CM | POA: Diagnosis not present

## 2022-04-03 DIAGNOSIS — E559 Vitamin D deficiency, unspecified: Secondary | ICD-10-CM | POA: Diagnosis not present

## 2022-04-03 DIAGNOSIS — D509 Iron deficiency anemia, unspecified: Secondary | ICD-10-CM | POA: Diagnosis not present

## 2022-04-03 LAB — CBC WITH DIFFERENTIAL/PLATELET
Absolute Monocytes: 605 cells/uL (ref 200–950)
Basophils Absolute: 50 cells/uL (ref 0–200)
Basophils Relative: 0.9 %
Eosinophils Absolute: 281 cells/uL (ref 15–500)
Eosinophils Relative: 5.1 %
HCT: 34.3 % — ABNORMAL LOW (ref 38.5–50.0)
Hemoglobin: 11.3 g/dL — ABNORMAL LOW (ref 13.2–17.1)
Lymphs Abs: 1452 cells/uL (ref 850–3900)
MCH: 33.6 pg — ABNORMAL HIGH (ref 27.0–33.0)
MCHC: 32.9 g/dL (ref 32.0–36.0)
MCV: 102.1 fL — ABNORMAL HIGH (ref 80.0–100.0)
MPV: 10 fL (ref 7.5–12.5)
Monocytes Relative: 11 %
Neutro Abs: 3113 cells/uL (ref 1500–7800)
Neutrophils Relative %: 56.6 %
Platelets: 378 10*3/uL (ref 140–400)
RBC: 3.36 10*6/uL — ABNORMAL LOW (ref 4.20–5.80)
RDW: 12 % (ref 11.0–15.0)
Total Lymphocyte: 26.4 %
WBC: 5.5 10*3/uL (ref 3.8–10.8)

## 2022-04-03 LAB — IRON,TIBC AND FERRITIN PANEL
%SAT: 20 % (calc) (ref 20–48)
Ferritin: 19 ng/mL — ABNORMAL LOW (ref 24–380)
Iron: 79 ug/dL (ref 50–180)
TIBC: 399 mcg/dL (calc) (ref 250–425)

## 2022-04-03 LAB — VITAMIN D 25 HYDROXY (VIT D DEFICIENCY, FRACTURES): Vit D, 25-Hydroxy: 29 ng/mL — ABNORMAL LOW (ref 30–100)

## 2022-04-22 ENCOUNTER — Other Ambulatory Visit: Payer: Self-pay

## 2022-04-22 DIAGNOSIS — K922 Gastrointestinal hemorrhage, unspecified: Secondary | ICD-10-CM

## 2022-04-22 DIAGNOSIS — D509 Iron deficiency anemia, unspecified: Secondary | ICD-10-CM

## 2022-04-22 DIAGNOSIS — R7989 Other specified abnormal findings of blood chemistry: Secondary | ICD-10-CM

## 2022-04-25 ENCOUNTER — Encounter: Payer: Self-pay | Admitting: Internal Medicine

## 2022-07-02 ENCOUNTER — Ambulatory Visit: Payer: Medicare Other | Admitting: Internal Medicine

## 2022-07-03 ENCOUNTER — Other Ambulatory Visit: Payer: Self-pay | Admitting: Urology

## 2022-07-03 DIAGNOSIS — R310 Gross hematuria: Secondary | ICD-10-CM | POA: Diagnosis not present

## 2022-07-03 DIAGNOSIS — N200 Calculus of kidney: Secondary | ICD-10-CM | POA: Diagnosis not present

## 2022-07-04 ENCOUNTER — Encounter (HOSPITAL_BASED_OUTPATIENT_CLINIC_OR_DEPARTMENT_OTHER): Payer: Self-pay | Admitting: Urology

## 2022-07-04 ENCOUNTER — Other Ambulatory Visit: Payer: Self-pay | Admitting: Internal Medicine

## 2022-07-04 ENCOUNTER — Other Ambulatory Visit: Payer: Self-pay | Admitting: Gastroenterology

## 2022-07-04 NOTE — Progress Notes (Signed)
Spoke with pt regarding upcoming ESWL for 07/08/22. Pt instructed to arrive at [facility] at 10:30 am Monday, NPO at midnight, clear liquids until 0430. Pt to hold vitamins and supplements until procedure. Pt states no longer taking amlodipine. No aspirin or NSAID products. Driver secured.

## 2022-07-05 ENCOUNTER — Other Ambulatory Visit: Payer: Self-pay | Admitting: Family Medicine

## 2022-07-05 ENCOUNTER — Encounter: Payer: Self-pay | Admitting: Family

## 2022-07-05 ENCOUNTER — Ambulatory Visit (INDEPENDENT_AMBULATORY_CARE_PROVIDER_SITE_OTHER): Payer: Medicare Other | Admitting: Family

## 2022-07-05 VITALS — BP 130/84 | HR 88 | Temp 98.5°F | Ht 70.0 in | Wt 140.0 lb

## 2022-07-05 DIAGNOSIS — E559 Vitamin D deficiency, unspecified: Secondary | ICD-10-CM | POA: Diagnosis not present

## 2022-07-05 DIAGNOSIS — I6930 Unspecified sequelae of cerebral infarction: Secondary | ICD-10-CM

## 2022-07-05 DIAGNOSIS — R351 Nocturia: Secondary | ICD-10-CM

## 2022-07-05 DIAGNOSIS — N401 Enlarged prostate with lower urinary tract symptoms: Secondary | ICD-10-CM | POA: Diagnosis not present

## 2022-07-05 DIAGNOSIS — E785 Hyperlipidemia, unspecified: Secondary | ICD-10-CM | POA: Diagnosis not present

## 2022-07-05 DIAGNOSIS — Z0001 Encounter for general adult medical examination with abnormal findings: Secondary | ICD-10-CM | POA: Diagnosis not present

## 2022-07-05 DIAGNOSIS — D649 Anemia, unspecified: Secondary | ICD-10-CM

## 2022-07-05 DIAGNOSIS — I10 Essential (primary) hypertension: Secondary | ICD-10-CM | POA: Diagnosis not present

## 2022-07-05 DIAGNOSIS — D509 Iron deficiency anemia, unspecified: Secondary | ICD-10-CM | POA: Diagnosis not present

## 2022-07-05 DIAGNOSIS — N200 Calculus of kidney: Secondary | ICD-10-CM | POA: Diagnosis not present

## 2022-07-05 DIAGNOSIS — Z0000 Encounter for general adult medical examination without abnormal findings: Secondary | ICD-10-CM

## 2022-07-05 NOTE — Patient Instructions (Signed)
Restless Legs Syndrome Restless legs syndrome is a condition that causes uncomfortable feelings or sensations in the legs, especially while sitting or lying down. The sensations usually cause an overwhelming urge to move the legs. The arms can also sometimes be affected. The condition can range from mild to severe. The symptoms often interfere with a person's ability to sleep. What are the causes? The cause of this condition is not known. What increases the risk? The following factors may make you more likely to develop this condition: Being older than 50. Pregnancy. Being a woman. In general, the condition is more common in women than in men. A family history of the condition. Having iron deficiency. Overuse of caffeine, nicotine, or alcohol. Certain medical conditions, such as kidney disease, Parkinson's disease, or nerve damage. Certain medicines, such as those for high blood pressure, nausea, colds, allergies, depression, and some heart conditions. What are the signs or symptoms? The main symptom of this condition is uncomfortable sensations in the legs, such as: Pulling. Tingling. Prickling. Throbbing. Crawling. Burning. Usually, the sensations: Affect both sides of the body. Are worse when you sit or lie down. Are worse at night. These may make it difficult to fall asleep. Make you have a strong urge to move your legs. Are temporarily relieved by moving your legs or standing. The arms can also be affected, but this is rare. People who have this condition often have tiredness during the day because of their lack of sleep at night. How is this diagnosed? This condition may be diagnosed based on: Your symptoms. Blood tests. In some cases, you may be monitored in a sleep lab by a specialist (a sleep study). This can detect any disruptions in your sleep. How is this treated? This condition is treated by managing the symptoms. This may include: Lifestyle changes, such as  exercising, using relaxation techniques, and avoiding caffeine, alcohol, or tobacco. Iron supplements. Medicines. Parkinson's medications may be tried first. Anti-seizure medications can also be helpful. Follow these instructions at home: General instructions Take over-the-counter and prescription medicines only as told by your health care provider. Use methods to help relieve the uncomfortable sensations, such as: Massaging your legs. Walking or stretching. Taking a cold or hot bath. Keep all follow-up visits. This is important. Lifestyle     Practice good sleep habits. For example, go to bed and get up at the same time every day. Most adults should get 7-9 hours of sleep each night. Exercise regularly. Try to get at least 30 minutes of exercise most days of the week. Practice ways of relaxing, such as yoga or meditation. Avoid caffeine and alcohol. Do not use any products that contain nicotine or tobacco. These products include cigarettes, chewing tobacco, and vaping devices, such as e-cigarettes. If you need help quitting, ask your health care provider. Where to find more information National Institute of Neurological Disorders and Stroke: www.ninds.nih.gov Contact a health care provider if: Your symptoms get worse or they do not improve with treatment. Summary Restless legs syndrome is a condition that causes uncomfortable feelings or sensations in the legs, especially while sitting or lying down. The symptoms often interfere with your ability to sleep. This condition is treated by managing the symptoms. You may need to make lifestyle changes or take medicines. This information is not intended to replace advice given to you by your health care provider. Make sure you discuss any questions you have with your health care provider. Document Revised: 05/06/2021 Document Reviewed: 05/06/2021 Elsevier Patient Education    2023 Elsevier Inc.  
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
## 2022-07-06 LAB — FE+CBC/D/PLT+TIBC+FER+RETIC
Basophils Absolute: 0.1 10*3/uL (ref 0.0–0.2)
Basos: 1 %
EOS (ABSOLUTE): 0.2 10*3/uL (ref 0.0–0.4)
Eos: 3 %
Ferritin: 64 ng/mL (ref 30–400)
Hematocrit: 32 % — ABNORMAL LOW (ref 37.5–51.0)
Hemoglobin: 10.4 g/dL — ABNORMAL LOW (ref 13.0–17.7)
Immature Grans (Abs): 0 10*3/uL (ref 0.0–0.1)
Immature Granulocytes: 0 %
Iron Saturation: 22 % (ref 15–55)
Iron: 88 ug/dL (ref 38–169)
Lymphocytes Absolute: 1 10*3/uL (ref 0.7–3.1)
Lymphs: 18 %
MCH: 33.4 pg — ABNORMAL HIGH (ref 26.6–33.0)
MCHC: 32.5 g/dL (ref 31.5–35.7)
MCV: 103 fL — ABNORMAL HIGH (ref 79–97)
Monocytes Absolute: 0.6 10*3/uL (ref 0.1–0.9)
Monocytes: 10 %
Neutrophils Absolute: 3.9 10*3/uL (ref 1.4–7.0)
Neutrophils: 68 %
Platelets: 448 10*3/uL (ref 150–450)
RBC: 3.11 x10E6/uL — ABNORMAL LOW (ref 4.14–5.80)
RDW: 11.5 % — ABNORMAL LOW (ref 11.6–15.4)
Retic Ct Pct: 3.7 % — ABNORMAL HIGH (ref 0.6–2.6)
Total Iron Binding Capacity: 399 ug/dL (ref 250–450)
UIBC: 311 ug/dL (ref 111–343)
WBC: 5.7 10*3/uL (ref 3.4–10.8)

## 2022-07-06 LAB — LIPID PANEL
Chol/HDL Ratio: 2.5 ratio (ref 0.0–5.0)
Cholesterol, Total: 138 mg/dL (ref 100–199)
HDL: 56 mg/dL (ref >39)
LDL Chol Calc (NIH): 41 mg/dL (ref 0–99)
Triglycerides: 268 mg/dL — ABNORMAL HIGH (ref 0–149)
VLDL Cholesterol Cal: 41 mg/dL — ABNORMAL HIGH (ref 5–40)

## 2022-07-06 LAB — CMP14+EGFR
ALT: 13 IU/L (ref 0–44)
AST: 17 IU/L (ref 0–40)
Albumin/Globulin Ratio: 1.7 (ref 1.2–2.2)
Albumin: 3.9 g/dL (ref 3.9–4.9)
Alkaline Phosphatase: 80 IU/L (ref 44–121)
BUN/Creatinine Ratio: 21 (ref 10–24)
BUN: 18 mg/dL (ref 8–27)
Bilirubin Total: <0.2 mg/dL (ref 0.0–1.2)
CO2: 26 mmol/L (ref 20–29)
Calcium: 9.2 mg/dL (ref 8.6–10.2)
Chloride: 102 mmol/L (ref 96–106)
Creatinine, Ser: 0.87 mg/dL (ref 0.76–1.27)
Globulin, Total: 2.3 g/dL (ref 1.5–4.5)
Glucose: 96 mg/dL (ref 70–99)
Potassium: 4.4 mmol/L (ref 3.5–5.2)
Sodium: 140 mmol/L (ref 134–144)
Total Protein: 6.2 g/dL (ref 6.0–8.5)
eGFR: 98 mL/min/{1.73_m2} (ref >59)

## 2022-07-06 LAB — PSA, TOTAL AND FREE
PSA, Free Pct: 36.7 %
PSA, Free: 0.44 ng/mL
Prostate Specific Ag, Serum: 1.2 ng/mL (ref 0.0–4.0)

## 2022-07-06 LAB — TSH: TSH: 2.3 u[IU]/mL (ref 0.450–4.500)

## 2022-07-06 LAB — VITAMIN D 25 HYDROXY (VIT D DEFICIENCY, FRACTURES): Vit D, 25-Hydroxy: 24.6 ng/mL — ABNORMAL LOW (ref 30.0–100.0)

## 2022-07-08 ENCOUNTER — Other Ambulatory Visit: Payer: Self-pay | Admitting: Urology

## 2022-07-08 ENCOUNTER — Other Ambulatory Visit: Payer: Self-pay | Admitting: Family

## 2022-07-08 ENCOUNTER — Other Ambulatory Visit: Payer: Self-pay | Admitting: Family Medicine

## 2022-07-08 DIAGNOSIS — D649 Anemia, unspecified: Secondary | ICD-10-CM

## 2022-07-08 MED ORDER — VITAMIN D (ERGOCALCIFEROL) 1.25 MG (50000 UNIT) PO CAPS: 50000.0000 [IU] | ORAL_CAPSULE | ORAL | 3 refills | Status: DC

## 2022-07-09 ENCOUNTER — Other Ambulatory Visit: Payer: Medicare Other

## 2022-07-09 DIAGNOSIS — D649 Anemia, unspecified: Secondary | ICD-10-CM | POA: Diagnosis not present

## 2022-07-10 LAB — FECAL OCCULT BLOOD, IMMUNOCHEMICAL: Fecal Occult Bld: POSITIVE — AB

## 2022-07-11 NOTE — Progress Notes (Signed)
Talked with patient. Instructions given. Arrival time 0800 NPO after MN. Wife is the driver.

## 2022-07-15 ENCOUNTER — Ambulatory Visit (HOSPITAL_BASED_OUTPATIENT_CLINIC_OR_DEPARTMENT_OTHER): Admission: RE | Admit: 2022-07-15 | Payer: Medicare Other | Source: Home / Self Care | Admitting: Urology

## 2022-07-15 SURGERY — LITHOTRIPSY, ESWL
Anesthesia: LOCAL | Laterality: Right

## 2022-07-16 ENCOUNTER — Ambulatory Visit: Payer: Medicare Other | Admitting: Internal Medicine

## 2022-07-23 ENCOUNTER — Encounter: Payer: Self-pay | Admitting: Internal Medicine

## 2022-07-23 ENCOUNTER — Ambulatory Visit: Payer: Medicare Other | Admitting: Internal Medicine

## 2022-07-23 VITALS — BP 135/87 | HR 72 | Temp 97.5°F | Ht 70.0 in | Wt 142.6 lb

## 2022-07-23 DIAGNOSIS — K90821 Short bowel syndrome with colon in continuity: Secondary | ICD-10-CM

## 2022-07-23 DIAGNOSIS — E538 Deficiency of other specified B group vitamins: Secondary | ICD-10-CM | POA: Diagnosis not present

## 2022-07-23 DIAGNOSIS — K529 Noninfective gastroenteritis and colitis, unspecified: Secondary | ICD-10-CM | POA: Diagnosis not present

## 2022-07-23 DIAGNOSIS — D509 Iron deficiency anemia, unspecified: Secondary | ICD-10-CM | POA: Diagnosis not present
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
## 2022-07-23 NOTE — Patient Instructions (Signed)
It was good seeing you again today!  Continue with all your vitamin supplements including vitamin D, trace elements and iron  Please check the health food supplement you are taking to make sure it contains no aspirin products.  May continue taking Imodium after each loose stool  Follow-up with Dr. Lucious Groves regarding your kidney stone  We will repeat a CBC in 2 months  Office visit with [place] in 3 months

## 2022-07-24 ENCOUNTER — Other Ambulatory Visit: Payer: Self-pay

## 2022-07-24 DIAGNOSIS — D509 Iron deficiency anemia, unspecified: Secondary | ICD-10-CM

## 2022-07-30 DIAGNOSIS — K820 Obstruction of gallbladder: Secondary | ICD-10-CM | POA: Diagnosis not present

## 2022-07-30 DIAGNOSIS — Z9049 Acquired absence of other specified parts of digestive tract: Secondary | ICD-10-CM | POA: Diagnosis not present

## 2022-07-30 DIAGNOSIS — R3121 Asymptomatic microscopic hematuria: Secondary | ICD-10-CM | POA: Diagnosis not present

## 2022-07-30 DIAGNOSIS — I700 Atherosclerosis of aorta: Secondary | ICD-10-CM | POA: Diagnosis not present

## 2022-07-30 DIAGNOSIS — N200 Calculus of kidney: Secondary | ICD-10-CM | POA: Diagnosis not present

## 2022-08-02 ENCOUNTER — Other Ambulatory Visit: Payer: Self-pay | Admitting: Urology

## 2022-08-06 ENCOUNTER — Other Ambulatory Visit: Payer: Self-pay | Admitting: Internal Medicine

## 2022-08-06 ENCOUNTER — Other Ambulatory Visit: Payer: Self-pay | Admitting: Family

## 2022-08-06 DIAGNOSIS — I10 Essential (primary) hypertension: Secondary | ICD-10-CM
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
## 2022-08-11 NOTE — H&P (View-Only) (Signed)
It appears the patient had bilateral renal stones when assessed several weeks ago. He had ureteroscopy on the left side 2020. He was found to have multiple stones in left kidney 1 as large as 1.4 cm. A KUB and ultrasound were performed and was thought he had a 7 mm stone on the left with no obstruction. Was felt that is bilateral flank and lower abdominal discomfort was not due to stone disease but could have been from chronic back issues and constipation. He elected for lithotripsy   04/04/21: patient came in today post lithotripsy. He still has the right and left-sided pain. He still has low back pain. I found the conversation was little bit circular. I think the same pain he had prior to the procedure. It may or may not be worse and he could not seem to clarify this. He does feel nauseated but no fever   No CVA tenderness   he brought in several fragments and I symptom for analysis   I thought it was best to get a stone protocol to sort out if he currently has ureteral stone   on CT scan today he did not have any hydronephrosis bilaterally. It appears he likely has a hematoma around his left kidney. There may be some edema or extravasation that some mild around the kidney. There was some calcifications in the area of the renal pelvis. I will call the report defers.   A picture was drawn. I will get a stat CBC and basic metabolic panel as a baseline. I think he should be followed closely. I do not think he needed a stent today but just watchful waiting. Fever in indication go the emergency room were discussed. Because it is a long weekend law have him sees provider be on Friday recognizing busy schedules. I thought it was best to keep a close eye on him.   recognizing it may not be necessary I called him in Bactrim DS 1 tablet twice a day for 7 days. I gave him more hydrocodone. See Dr Viona Gilmore on Friday. Call pending report and labs  I told him to stop his aspirin   04/06/21: [name] returns today in f/u.  He had ESWL on 6/23 for a left renal stone. He was seen on 6/29 with pain and a CT showed a moderate LLP hematoma with a fragmented stone. He is now having pain on the right in the low back pain and some nausea. He got worse working in the garden yesterday. He has no passed any further fragments. His Hgb was ok on 04/04/21. His IPSS is 20.   04/12/21: He presents today for follow up of LLP hematoma. he is currently having right lower back pain that wraps around to his abdomen. He has not passed any more fragments. He denies fevers and chills. He reports that previously prescribed pain medication was not helping. He states his pain is severe. He has not had a bowel movement in one week. He is currently on Suboxone.   12/24/2021: 61 year old male who presents today with concerns of right-sided back pain that radiates down into his right hip and across his lower back. He was recently seen by his primary care physician who ordered a renal ultrasound. This showed a 12 mm stone in his left kidney which was not causing any obstruction at this time. He states that the pain is a lot worse with ambulation and movement and better rest. He had a recent CT scan in December 2022 that showed  small left-sided renal stones. There were no kidney stones on his right. Renal ultrasound from last week shows no kidney stones on the right and a possible 12 mm stone within the left kidney. He denies gross hematuria and changes to his voiding habits. He has follow-up with his back doctor for chronic pain pain management and he is going to discuss this pain with him. He takes chronic narcotics for pain. He states that the pain medicine does not touch his pain when it is severe.   02/25/22: [name] returns today in f/u for his history of stones. Today he reports a 1 month history of back pain that starts in the right back and radiates into the lower ribs. He has been distended for a week and has some swelling in the ankles. He has DOE and SOB  and can't even cook a meal. He has no had a cough or nausea. He has had normal BM's. He has no dark urine or yellow eye grounds. He has a variable BP. He gained about 15lb in a month.   05/10/22: [name] returns today with a 3 week history of left flank pain with some radiation to the right. He can't walk far without increased pain. He has no nausea. He has had no hematuria. He has flushing. He has no increased frequency or urgency. His IPSS is 11. He had a CT in May and had a few non-obstructing stones in the left kidney.   07/01/2022:  Patient returns to clinic for his 106mofollow-up for bilateral flank pain. He states he had to stop his tamsulosin due to symptomatic orthostasis and near syncope. In the past, massage has alleviated his symptoms well for 24 hours. Seen by chiropractor as well with no symptom relief. Today, he states that his pain is sharp bilaterally, to either side of mid lumbar spine, with radiation to the front abdomen. He denies increased frequency, hesitancy, or urgency, dysuria, gross hematuria, fever/chills, nausea/vomiting. Patient appears anxious, not to say distraught. He states that he is at wits end with the pain. He states that he cannot walk his dog without stopping halfway to rest his back, and also endorses pain with leaning forward. Patient is more than certain that his pain originates from his stone burden, he states. Today, conversation noted to be quite circular. He could not leave a voided urine specimen.     ALLERGIES: Lyrica Peanut Butter UTI med he doesn't know name of     MEDICATIONS: Omeprazole 40 mg capsule,delayed release  Alprazolam 1 mg tablet  Bupropion Xl 300 mg tablet, extended release 24 hr  Fluoxetine Hcl  Gabapentin  Levocetirizine Dihydrochloride  Olanzapine  Rosuvastatin Calcium  Suboxone     GU PSH: Cysto Remove Stent FB Sim - 2020, 2019 Ureteroscopic laser litho, Left - 2020     NON-GU PSH: Hernia Repair     GU PMH: Flank Pain, His  pain is more consistent with a musculoskeletal cause. He has renal stones without obstruction. I am going to order a lumbar MRI to r/o a herniated disc and recommended he see his chiropractor. - 05/10/2022, (Stable), - 02/25/2022, - 12/24/2021, - 04/12/2021, - 04/06/2021 (Stable), He continues to have back pain that is bilateral and worse with activity and most consistent with musculoskeletal pain. He is insistent that it is from his stones but the KUB today shows only LLP stones and no UPJ or ureteral stones. 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 I have reviewed the risks of ureteroscopy including bleeding, infection, ureteral injury, need for a stent or secondary procedures, thrombotic events and anesthetic complications. , - 2020, - 2020 Renal calculus, I am going to put him on tamsulosin on the outside chance that he has a small ureteral stone and will have him return in 1 month. If he still has pain, we can consider a repeat CT. The CT in May may have had a 58m renal pelvic stone but it wasn't obstructing and not commented on by radiology. 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 , - 02/25/2022, - 12/24/2021, - 04/06/2021, - 03/16/2021 (Improving), He has some residual fragment in the LLP but no hydro. I will just have him return in a year with a KUB and renal [place] , - 2020, I will have him return in 1 month with a KUB and renal [place] , - 2019, - 2019 RLQ pain, He has flank and RLQ pain without a clear cause. On the CT and renal [place] has stable renal stones without obstruction. He has a possible small RIH. He is going to see his pain management MD. - 02/25/2022 Ureteral calculus - 04/12/2021, - 04/04/2021 Hydronephrosis - 04/04/2021, HE has mild dilation of the left renal pelvis and has been told he has a vessel  that might need to be divided. I think he may have a crossing vessel. I am going to get a renogram to assess for obstruction. , - 2019 Microscopic hematuria - 2020 Acute prostatitis - 2020 Renal cyst, HE has a stable left lower pole simple cyst that is not likely to be the source of his back pain and other symptoms. - 2019      PMH Notes: Kidney failure   NON-GU PMH: Other intervertebral disc degeneration, lumbar region, HE has lumbar DDD on the CT from may. - 05/10/2022 Postprocedural hematoma of a genitourinary system organ or structure following a genitourinary system procedure - 04/12/2021, He has a moderate left lower pole hematoma with improvement in the pain but he has some right flank pain but had no stones on his the CT this week. I have refilled the pain med and recommended miralax as a stool softener. He will return in a week with a KUB and Renal [place] , - 04/06/2021 Anxiety Arrhythmia Arthritis Atrial Fibrillation Depression Diabetes Type 2 GERD Hypercholesterolemia Hypertension Hyperthyroidism Hypothyroidism Pulmonary Embolism, History Sleep Apnea    FAMILY HISTORY: 1 - Son   SOCIAL HISTORY: Marital Status: Widowed Preferred Language: English Current Smoking Status: Patient smokes occasionally.   Tobacco Use Assessment Completed: Used Tobacco in last 30 days? Has never drank.  Drinks 4+ caffeinated drinks per day. Patient's occupation is/was Retired.    REVIEW OF SYSTEMS:    GU Review Male:   Patient reports get up at night to urinate, stream starts and stops, trouble starting your stream, and have to strain to urinate . Patient denies frequent urination, hard to postpone urination, burning/ pain with urination, erection problems, and penile pain.  Gastrointestinal (Upper):   Patient denies nausea, vomiting, and indigestion/ heartburn.  Gastrointestinal (Lower):   Patient denies diarrhea and constipation.  Constitutional:   Patient denies fever, night sweats, weight  loss, and fatigue.  Skin:   Patient denies itching and skin rash/ lesion.  Eyes:   Patient denies blurred  vision and double vision.  Ears/ Nose/ Throat:   Patient denies sore throat and sinus problems.  Hematologic/Lymphatic:   Patient denies swollen glands and easy bruising.  Cardiovascular:   Patient denies chest pains.  Respiratory:   Patient denies cough and shortness of breath.  Endocrine:   Patient denies excessive thirst.  Musculoskeletal:   Patient reports back pain and joint pain.   Neurological:   Patient reports dizziness. Patient denies headaches.  Psychologic:   Patient denies depression and anxiety.   VITAL SIGNS:      07/01/2022 12:34 PM  BP 117/80 mmHg  Pulse 93 /min  Temperature 97.7 F / 36.5 C   MULTI-SYSTEM PHYSICAL EXAMINATION:    Constitutional: Well-nourished. No physical deformities. Normally developed. Good grooming.  Neck: Neck symmetrical, not swollen. Normal tracheal position.  Respiratory: No labored breathing, no use of accessory muscles.   Cardiovascular: Normal temperature, normal extremity pulses, no swelling, no varicosities.  Skin: No paleness, no jaundice, no cyanosis. No lesion, no ulcer, no rash.  Neurologic / Psychiatric: Oriented to time, oriented to place, oriented to person. No depression, no anxiety, no agitation.  Gastrointestinal: Obese abdomen, bilateral para-lumbar tenderness with radiation to the abdomen. No CVA or suprapubic tendernes, no mass, no rigidity.   Musculoskeletal: Normal gait and station of head and neck.     Complexity of Data:  Source Of History:  Patient, Medical Record Summary  Records Review:   Previous Doctor Records, Previous Hospital Records, Previous Patient Records  Urine Test Review:   Urinalysis  Urodynamics Review:   Review Bladder Scan  X-Ray Review: KUB: Reviewed Films.  Renal Ultrasound: Reviewed Films.  C.T. Abdomen/Pelvis: Reviewed Films. Reviewed Report.     PROCEDURES:         C.T. Urogram - [id number]       Patient confirmed No Neulasta OnPro Device.   ASSESSMENT:      ICD-10 Details  1 GU:   Renal calculus - N20.0 Chronic, Stable  2   Flank Pain - R10.84 Chronic, Worsening   PLAN:            Medications Stop Meds: Tamsulosin Hcl 0.4 mg capsule 1 capsule PO Daily  Start: 05/10/2022  Stop: 07/09/2022   Discontinue: 07/01/2022  - Reason: The patient suffered unacceptable side effects of orthostasis with dizziness and near-syncope.           Orders X-Rays: C.T. Stone Protocol Without I.V. Contrast  X-Ray Notes: History:  Hematuria: Yes/No  Patient to see MD after exam: Yes/No  Previous exam: CT / IVP/ US/ KUB/ None  When:  Where:  Diabetic: Yes/ No  BUN/ Creatinine:  Date of last BUN Creatinine:  Weight in pounds:  Allergy- IV Contrast: Yes/ No  Conflicting diabetic meds: Yes/ No  Diabetic Meds:  Prior Authorization #: [id number]            Schedule X-Rays: 6 Months - KUB    6 Months - Renal Ultrasound  Return Visit/Planned Activity: 4-6 Weeks - Office Visit, Follow up MD          Document Letter(s):  Created for Patient: Clinical Summary         Notes:   Today, CT scan without any calcifications noted in right kidney or ureter. Left kidney with 4 or 5 stones, largest of which is 5 mm approximately. No calcifications visible in left ureter or bladder. This is noted to be stable in comparison to previous CT. Discussed these findings with patient, and awaiting  radiological overread. We counseled patient that the nature of his pain is not indicative of stone pathology and more likely MSK in nature given his bilateral pain presentation. Patient continues to disagree, and would like to be considered for elective ureteroscopy uteroscopy to achieve stone free status, and therefore relieving his back pain. Due to the stable nature of his known stone burden and the lack of obstruction or hydronephrosis bilaterally, we counseled again procedural intervention may result in  unfavorable risk/benefit outcomes. Patient voiced understanding, but is maintaining his request for procedure. We will notify his urologist, and see him back at next available appointment with his urologist. Patient amenable to this plan.      APPENDED NOTES:    HPI, imaging reviewed with Dr. Jeffie Pollock. Patient's desire for ureteroscopy communicated to his provider. After review, Dr. Jeffie Pollock is agreeable. Patient contacted, echoing what was discussed at yesterday's visit that even after ureteroscopy for nonobstructing renal calculi this may not resolve his ongoing/chronic back pain. Communicated to patient due to numerous stones it may require a staged procedure.   For ureteroscopy I described the risks which include heart attack, stroke, pulmonary embolus, death, bleeding, infection, damage to contiguous structures, positioning injury, ureteral stricture, ureteral avulsion, ureteral injury, need for ureteral stent, inability to perform ureteroscopy, need for an interval procedure, inability to clear stone burden, stent discomfort and pain.   All questions answered to the best my ability regarding the upcoming procedure and expected operative course with understanding expressed by the patient. He elects to proceed. I will turn in appropriate paperwork to the scheduler who will then contact him to set this up sometime in the near future.

## 2022-08-11 NOTE — H&P (Signed)
It appears the patient had bilateral renal stones when assessed several weeks ago. He had ureteroscopy on the left side 2020. He was found to have multiple stones in left kidney 1 as large as 1.4 cm. A KUB and ultrasound were performed and was thought he had a 7 mm stone on the left with no obstruction. Was felt that is bilateral flank and lower abdominal discomfort was not due to stone disease but could have been from chronic back issues and constipation. He elected for lithotripsy   04/04/21: patient came in today post lithotripsy. He still has the right and left-sided pain. He still has low back pain. I found the conversation was little bit circular. I think the same pain he had prior to the procedure. It may or may not be worse and he could not seem to clarify this. He does feel nauseated but no fever   No CVA tenderness   he brought in several fragments and I symptom for analysis   I thought it was best to get a stone protocol to sort out if he currently has ureteral stone   on CT scan today he did not have any hydronephrosis bilaterally. It appears he likely has a hematoma around his left kidney. There may be some edema or extravasation that some mild around the kidney. There was some calcifications in the area of the renal pelvis. I will call the report defers.   A picture was drawn. I will get a stat CBC and basic metabolic panel as a baseline. I think he should be followed closely. I do not think he needed a stent today but just watchful waiting. Fever in indication go the emergency room were discussed. Because it is a long weekend law have him sees provider be on Friday recognizing busy schedules. I thought it was best to keep a close eye on him.   recognizing it may not be necessary I called him in Bactrim DS 1 tablet twice a day for 7 days. I gave him more hydrocodone. See Dr Viona Gilmore on Friday. Call pending report and labs  I told him to stop his aspirin   04/06/21: [name] returns today in f/u.  He had ESWL on 6/23 for a left renal stone. He was seen on 6/29 with pain and a CT showed a moderate LLP hematoma with a fragmented stone. He is now having pain on the right in the low back pain and some nausea. He got worse working in the garden yesterday. He has no passed any further fragments. His Hgb was ok on 04/04/21. His IPSS is 20.   04/12/21: He presents today for follow up of LLP hematoma. he is currently having right lower back pain that wraps around to his abdomen. He has not passed any more fragments. He denies fevers and chills. He reports that previously prescribed pain medication was not helping. He states his pain is severe. He has not had a bowel movement in one week. He is currently on Suboxone.   12/24/2021: 61 year old male who presents today with concerns of right-sided back pain that radiates down into his right hip and across his lower back. He was recently seen by his primary care physician who ordered a renal ultrasound. This showed a 12 mm stone in his left kidney which was not causing any obstruction at this time. He states that the pain is a lot worse with ambulation and movement and better rest. He had a recent CT scan in December 2022 that showed  small left-sided renal stones. There were no kidney stones on his right. Renal ultrasound from last week shows no kidney stones on the right and a possible 12 mm stone within the left kidney. He denies gross hematuria and changes to his voiding habits. He has follow-up with his back doctor for chronic pain pain management and he is going to discuss this pain with him. He takes chronic narcotics for pain. He states that the pain medicine does not touch his pain when it is severe.   02/25/22: [name] returns today in f/u for his history of stones. Today he reports a 1 month history of back pain that starts in the right back and radiates into the lower ribs. He has been distended for a week and has some swelling in the ankles. He has DOE and SOB  and can't even cook a meal. He has no had a cough or nausea. He has had normal BM's. He has no dark urine or yellow eye grounds. He has a variable BP. He gained about 15lb in a month.   05/10/22: [name] returns today with a 3 week history of left flank pain with some radiation to the right. He can't walk far without increased pain. He has no nausea. He has had no hematuria. He has flushing. He has no increased frequency or urgency. His IPSS is 11. He had a CT in May and had a few non-obstructing stones in the left kidney.   07/01/2022:  Patient returns to clinic for his 24mofollow-up for bilateral flank pain. He states he had to stop his tamsulosin due to symptomatic orthostasis and near syncope. In the past, massage has alleviated his symptoms well for 24 hours. Seen by chiropractor as well with no symptom relief. Today, he states that his pain is sharp bilaterally, to either side of mid lumbar spine, with radiation to the front abdomen. He denies increased frequency, hesitancy, or urgency, dysuria, gross hematuria, fever/chills, nausea/vomiting. Patient appears anxious, not to say distraught. He states that he is at wits end with the pain. He states that he cannot walk his dog without stopping halfway to rest his back, and also endorses pain with leaning forward. Patient is more than certain that his pain originates from his stone burden, he states. Today, conversation noted to be quite circular. He could not leave a voided urine specimen.     ALLERGIES: Lyrica Peanut Butter UTI med he doesn't know name of     MEDICATIONS: Omeprazole 40 mg capsule,delayed release  Alprazolam 1 mg tablet  Bupropion Xl 300 mg tablet, extended release 24 hr  Fluoxetine Hcl  Gabapentin  Levocetirizine Dihydrochloride  Olanzapine  Rosuvastatin Calcium  Suboxone     GU PSH: Cysto Remove Stent FB Sim - 2020, 2019 Ureteroscopic laser litho, Left - 2020     NON-GU PSH: Hernia Repair     GU PMH: Flank Pain, His  pain is more consistent with a musculoskeletal cause. He has renal stones without obstruction. I am going to order a lumbar MRI to r/o a herniated disc and recommended he see his chiropractor. - 05/10/2022, (Stable), - 02/25/2022, - 12/24/2021, - 04/12/2021, - 04/06/2021 (Stable), He continues to have back pain that is bilateral and worse with activity and most consistent with musculoskeletal pain. He is insistent that it is from his stones but the KUB today shows only LLP stones and no UPJ or ureteral stones. 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 I have reviewed the risks of ureteroscopy including bleeding, infection, ureteral injury, need for a stent or secondary procedures, thrombotic events and anesthetic complications. , - 2020, - 2020 Renal calculus, I am going to put him on tamsulosin on the outside chance that he has a small ureteral stone and will have him return in 1 month. If he still has pain, we can consider a repeat CT. The CT in May may have had a 61m renal pelvic stone but it wasn't obstructing and not commented on by radiology. 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 , - 02/25/2022, - 12/24/2021, - 04/06/2021, - 03/16/2021 (Improving), He has some residual fragment in the LLP but no hydro. I will just have him return in a year with a KUB and renal [place] , - 2020, I will have him return in 1 month with a KUB and renal [place] , - 2019, - 2019 RLQ pain, He has flank and RLQ pain without a clear cause. On the CT and renal [place] has stable renal stones without obstruction. He has a possible small RIH. He is going to see his pain management MD. - 02/25/2022 Ureteral calculus - 04/12/2021, - 04/04/2021 Hydronephrosis - 04/04/2021, HE has mild dilation of the left renal pelvis and has been told he has a vessel  that might need to be divided. I think he may have a crossing vessel. I am going to get a renogram to assess for obstruction. , - 2019 Microscopic hematuria - 2020 Acute prostatitis - 2020 Renal cyst, HE has a stable left lower pole simple cyst that is not likely to be the source of his back pain and other symptoms. - 2019      PMH Notes: Kidney failure   NON-GU PMH: Other intervertebral disc degeneration, lumbar region, HE has lumbar DDD on the CT from may. - 05/10/2022 Postprocedural hematoma of a genitourinary system organ or structure following a genitourinary system procedure - 04/12/2021, He has a moderate left lower pole hematoma with improvement in the pain but he has some right flank pain but had no stones on his the CT this week. I have refilled the pain med and recommended miralax as a stool softener. He will return in a week with a KUB and Renal [place] , - 04/06/2021 Anxiety Arrhythmia Arthritis Atrial Fibrillation Depression Diabetes Type 2 GERD Hypercholesterolemia Hypertension Hyperthyroidism Hypothyroidism Pulmonary Embolism, History Sleep Apnea    FAMILY HISTORY: 1 - Son   SOCIAL HISTORY: Marital Status: Widowed Preferred Language: English Current Smoking Status: Patient smokes occasionally.   Tobacco Use Assessment Completed: Used Tobacco in last 30 days? Has never drank.  Drinks 4+ caffeinated drinks per day. Patient's occupation is/was Retired.    REVIEW OF SYSTEMS:    GU Review Male:   Patient reports get up at night to urinate, stream starts and stops, trouble starting your stream, and have to strain to urinate . Patient denies frequent urination, hard to postpone urination, burning/ pain with urination, erection problems, and penile pain.  Gastrointestinal (Upper):   Patient denies nausea, vomiting, and indigestion/ heartburn.  Gastrointestinal (Lower):   Patient denies diarrhea and constipation.  Constitutional:   Patient denies fever, night sweats, weight  loss, and fatigue.  Skin:   Patient denies itching and skin rash/ lesion.  Eyes:   Patient denies blurred  vision and double vision.  Ears/ Nose/ Throat:   Patient denies sore throat and sinus problems.  Hematologic/Lymphatic:   Patient denies swollen glands and easy bruising.  Cardiovascular:   Patient denies chest pains.  Respiratory:   Patient denies cough and shortness of breath.  Endocrine:   Patient denies excessive thirst.  Musculoskeletal:   Patient reports back pain and joint pain.   Neurological:   Patient reports dizziness. Patient denies headaches.  Psychologic:   Patient denies depression and anxiety.   VITAL SIGNS:      07/01/2022 12:34 PM  BP 117/80 mmHg  Pulse 93 /min  Temperature 97.7 F / 36.5 C   MULTI-SYSTEM PHYSICAL EXAMINATION:    Constitutional: Well-nourished. No physical deformities. Normally developed. Good grooming.  Neck: Neck symmetrical, not swollen. Normal tracheal position.  Respiratory: No labored breathing, no use of accessory muscles.   Cardiovascular: Normal temperature, normal extremity pulses, no swelling, no varicosities.  Skin: No paleness, no jaundice, no cyanosis. No lesion, no ulcer, no rash.  Neurologic / Psychiatric: Oriented to time, oriented to place, oriented to person. No depression, no anxiety, no agitation.  Gastrointestinal: Obese abdomen, bilateral para-lumbar tenderness with radiation to the abdomen. No CVA or suprapubic tendernes, no mass, no rigidity.   Musculoskeletal: Normal gait and station of head and neck.     Complexity of Data:  Source Of History:  Patient, Medical Record Summary  Records Review:   Previous Doctor Records, Previous Hospital Records, Previous Patient Records  Urine Test Review:   Urinalysis  Urodynamics Review:   Review Bladder Scan  X-Ray Review: KUB: Reviewed Films.  Renal Ultrasound: Reviewed Films.  C.T. Abdomen/Pelvis: Reviewed Films. Reviewed Report.     PROCEDURES:         C.T. Urogram - [id number]       Patient confirmed No Neulasta OnPro Device.   ASSESSMENT:      ICD-10 Details  1 GU:   Renal calculus - N20.0 Chronic, Stable  2   Flank Pain - R10.84 Chronic, Worsening   PLAN:            Medications Stop Meds: Tamsulosin Hcl 0.4 mg capsule 1 capsule PO Daily  Start: 05/10/2022  Stop: 07/09/2022   Discontinue: 07/01/2022  - Reason: The patient suffered unacceptable side effects of orthostasis with dizziness and near-syncope.           Orders X-Rays: C.T. Stone Protocol Without I.V. Contrast  X-Ray Notes: History:  Hematuria: Yes/No  Patient to see MD after exam: Yes/No  Previous exam: CT / IVP/ US/ KUB/ None  When:  Where:  Diabetic: Yes/ No  BUN/ Creatinine:  Date of last BUN Creatinine:  Weight in pounds:  Allergy- IV Contrast: Yes/ No  Conflicting diabetic meds: Yes/ No  Diabetic Meds:  Prior Authorization #: [id number]            Schedule X-Rays: 6 Months - KUB    6 Months - Renal Ultrasound  Return Visit/Planned Activity: 4-6 Weeks - Office Visit, Follow up MD          Document Letter(s):  Created for Patient: Clinical Summary         Notes:   Today, CT scan without any calcifications noted in right kidney or ureter. Left kidney with 4 or 5 stones, largest of which is 5 mm approximately. No calcifications visible in left ureter or bladder. This is noted to be stable in comparison to previous CT. Discussed these findings with patient, and awaiting  radiological overread. We counseled patient that the nature of his pain is not indicative of stone pathology and more likely MSK in nature given his bilateral pain presentation. Patient continues to disagree, and would like to be considered for elective ureteroscopy uteroscopy to achieve stone free status, and therefore relieving his back pain. Due to the stable nature of his known stone burden and the lack of obstruction or hydronephrosis bilaterally, we counseled again procedural intervention may result in  unfavorable risk/benefit outcomes. Patient voiced understanding, but is maintaining his request for procedure. We will notify his urologist, and see him back at next available appointment with his urologist. Patient amenable to this plan.      APPENDED NOTES:    HPI, imaging reviewed with Dr. Jeffie Pollock. Patient's desire for ureteroscopy communicated to his provider. After review, Dr. Jeffie Pollock is agreeable. Patient contacted, echoing what was discussed at yesterday's visit that even after ureteroscopy for nonobstructing renal calculi this may not resolve his ongoing/chronic back pain. Communicated to patient due to numerous stones it may require a staged procedure.   For ureteroscopy I described the risks which include heart attack, stroke, pulmonary embolus, death, bleeding, infection, damage to contiguous structures, positioning injury, ureteral stricture, ureteral avulsion, ureteral injury, need for ureteral stent, inability to perform ureteroscopy, need for an interval procedure, inability to clear stone burden, stent discomfort and pain.   All questions answered to the best my ability regarding the upcoming procedure and expected operative course with understanding expressed by the patient. He elects to proceed. I will turn in appropriate paperwork to the scheduler who will then contact him to set this up sometime in the near future.

## 2022-08-12 ENCOUNTER — Telehealth (INDEPENDENT_AMBULATORY_CARE_PROVIDER_SITE_OTHER): Payer: Medicare Other | Admitting: Family

## 2022-08-12 ENCOUNTER — Encounter: Payer: Self-pay | Admitting: Family

## 2022-08-12 ENCOUNTER — Ambulatory Visit (HOSPITAL_BASED_OUTPATIENT_CLINIC_OR_DEPARTMENT_OTHER): Admission: RE | Admit: 2022-08-12 | Payer: Medicare Other | Source: Home / Self Care | Admitting: Urology

## 2022-08-12 DIAGNOSIS — U071 COVID-19: Secondary | ICD-10-CM

## 2022-08-12 SURGERY — LITHOTRIPSY, ESWL
Anesthesia: LOCAL | Laterality: Right

## 2022-08-12 MED ORDER — MOLNUPIRAVIR EUA 200MG CAPSULE: 4.0000 | ORAL_CAPSULE | Freq: Two times a day (BID) | ORAL | 0 refills | Status: AC

## 2022-08-12 NOTE — Patient Instructions (Signed)

## 2022-08-12 NOTE — Progress Notes (Signed)
Virtual Visit Consent   [facility], you are scheduled for a virtual visit with a [facility] provider today. Just as with appointments in the office, your consent must be obtained to participate. Your consent will be active for this visit and any virtual visit you may have with one of our providers in the next 365 days. If you have a MyChart account, a copy of this consent can be sent to you electronically.  As this is a virtual visit, video technology does not allow for your provider to perform a traditional examination. This may limit your provider's ability to fully assess your condition. If your provider identifies any concerns that need to be evaluated in person or the need to arrange testing (such as labs, EKG, etc.), we will make arrangements to do so. Although advances in technology are sophisticated, we cannot ensure that it will always work on either your end or our end. If the connection with a video visit is poor, the visit may have to be switched to a telephone visit. With either a video or telephone visit, we are not always able to ensure that we have a secure connection.  By engaging in this virtual visit, you consent to the provision of healthcare and authorize for your insurance to be billed (if applicable) for the services provided during this visit. Depending on your insurance coverage, you may receive a charge related to this service.  I need to obtain your verbal consent now. Are you willing to proceed with your visit today? Camran Keady Godina has provided verbal consent on 08/12/2022 for a virtual visit (video or telephone). Evelina Dun, FNP  Date: 08/12/2022 9:19 AM  Virtual Visit via Video Note   I, Evelina Dun, connected with  ELMOR KOST  ([id number], 1960/10/11) on 08/12/22 at  6:00 PM EST by a video-enabled telemedicine application and verified that I am speaking with the correct person using two identifiers.  Location: Patient: Virtual Visit Location Patient:  Home Provider: Virtual Visit Location Provider: Office/Clinic   I discussed the limitations of evaluation and management by telemedicine and the availability of in person appointments. The patient expressed understanding and agreed to proceed.    History of Present Illness: QUAVON KEISLING is a 61 y.o. who identifies as a male who was assigned male at birth, and is being seen today for COVID. He reports his symptoms started two days ago.   HPI: URI  This is a new problem. The current episode started in the past 7 days. The problem has been gradually worsening. The maximum temperature recorded prior to his arrival was 103 - 104 F. Associated symptoms include congestion, coughing, rhinorrhea, sinus pain and sneezing. Pertinent negatives include no ear pain, headaches, nausea, plugged ear sensation, sore throat, swollen glands or vomiting. He has tried acetaminophen for the symptoms.    Problems:  Patient Active Problem List   Diagnosis Date Noted   Impingement syndrome of right shoulder 04/18/2021   Rectal bleeding    Protein-calorie malnutrition, severe 09/29/2020   Acute GI bleeding 09/28/2020   Anemia 09/28/2020   Chronic diarrhea    Iron deficiency anemia    B12 deficiency    Status post reversal of ileostomy 05/23/2020   Late effect of cerebrovascular accident (CVA) 10/20/2019   Thrombocytosis 09/27/2019   Sleep disturbance    Hyponatremia    Bilateral knee contractures 09/10/2019   Depression with anxiety    SVT (supraventricular tachycardia)    Pressure injury of skin 08/22/2019  Closed displaced fracture of left clavicle    Closed fracture of left wrist    Brainstem infarct, acute (HCC)    Leukocytosis    Closed fracture of shaft of left clavicle 08/06/2019   Fracture of left distal radius 08/06/2019   Bimalleolar ankle fracture, right, closed, initial encounter 08/06/2019   Traumatic injury of vascular supply of small intestine s/p ileocectomy 07/29/2019 07/29/2019    Condyloma acuminatum of penis 07/29/2019   Femur fracture, right ([place]) 07/29/2019   Traumatic hemoperitoneum 07/28/2019   DNR (do not resuscitate) 2020   Knee pain 06/08/2015   BPH (benign prostatic hyperplasia) 05/17/2014   Low testosterone    HTN (hypertension) 02/01/2013   Kidney stones 02/01/2013   Erectile dysfunction 02/01/2013   Dyslipidemia 02/01/2013   Hyperglycemia 02/01/2013   Vitamin D deficiency 02/01/2013   Polyuria 02/01/2013    Allergies: No Known Allergies Medications:  Current Outpatient Medications:    molnupiravir EUA (LAGEVRIO) 200 mg CAPS capsule, Take 4 capsules (800 mg total) by mouth 2 (two) times daily for 5 days., Disp: 40 capsule, Rfl: 0   atorvastatin (LIPITOR) 10 MG tablet, TAKE 1 TABLET BY MOUTH EVERY DAY, Disp: 90 tablet, Rfl: 1   cyanocobalamin (VITAMIN B12) 1000 MCG/ML injection, INJECT 1ML ONCE A MONTH AS DIRECTED (STOP TAKING ORAL VITAMIN B12), Disp: 3 mL, Rfl: 3   diphenhydramine-acetaminophen (TYLENOL PM) 25-500 MG TABS tablet, Take 1 tablet by mouth at bedtime., Disp: , Rfl:    ferrous sulfate 325 (65 FE) MG EC tablet, TAKE 1 TABLET BY MOUTH TWICE A DAY WITH MEALS, Disp: 180 tablet, Rfl: 1   hydrocortisone (ANUSOL-HC) 2.5 % rectal cream, APPLY TO THE ANORECTUM 3 TIMES DAILY AS NEEDED, Disp: 30 g, Rfl: 0   loperamide (IMODIUM) 2 MG capsule, TAKE 1 CAPSULE (2 MG TOTAL) BY MOUTH AS NEEDED FOR DIARRHEA OR LOOSE STOOLS (UP TO 8 PER DAY)., Disp: 240 capsule, Rfl: 11   Multiple Vitamin (MULTIVITAMIN WITH MINERALS) TABS tablet, Take 1 tablet by mouth daily., Disp: , Rfl:    SYRINGE-NEEDLE, DISP, 3 ML (BD ECLIPSE SYRINGE) 25G X 1" 3 ML MISC, 1 inch by Does not apply route every 30 (thirty) days. Whatever insurance covers for B12 injections, Disp: 24 each, Rfl: 0   Vitamin D, Ergocalciferol, (DRISDOL) 1.25 MG (50000 UNIT) CAPS capsule, Take 1 capsule (50,000 Units total) by mouth every 7 (seven) days., Disp: 12 capsule, Rfl:  3  Observations/Objective: Patient is well-developed, well-nourished in no acute distress.  Resting comfortably  at home.  Head is normocephalic, atraumatic.  No labored breathing.  Speech is clear and coherent with logical content.  Patient is alert and oriented at baseline.  Nasal congestion   Assessment and Plan: 1. COVID-19 - molnupiravir EUA (LAGEVRIO) 200 mg CAPS capsule; Take 4 capsules (800 mg total) by mouth 2 (two) times daily for 5 days.  Dispense: 40 capsule; Refill: 0  COVID positive, rest, force fluids, tylenol as needed, Quarantine for at least 5 days and you are fever free, then must wear a mask out in public from day 7-09, report any worsening symptoms such as increased shortness of breath, swelling, or continued high fevers. Possible adverse effects discussed with antivirals.    Follow Up Instructions: I discussed the assessment and treatment plan with the patient. The patient was provided an opportunity to ask questions and all were answered. The patient agreed with the plan and demonstrated an understanding of the instructions.  A copy of instructions were sent to the  patient via MyChart unless otherwise noted below.     The patient was advised to call back or seek an in-person evaluation if the symptoms worsen or if the condition fails to improve as anticipated.  Time:  I spent 11 minutes with the patient via telehealth technology discussing the above problems/concerns.    Evelina Dun, FNP

## 2022-08-23 ENCOUNTER — Encounter (HOSPITAL_BASED_OUTPATIENT_CLINIC_OR_DEPARTMENT_OTHER): Payer: Self-pay | Admitting: Urology

## 2022-08-23 NOTE — Progress Notes (Signed)
Pre-op phone call complete. Procedure date and arrival time confirmed. Medications, allergies, and medical history verified. Patient advised to stop vitamins. Patient advised to be NPO at midnight. Driver secured.

## 2022-08-26 ENCOUNTER — Encounter (HOSPITAL_BASED_OUTPATIENT_CLINIC_OR_DEPARTMENT_OTHER): Payer: Self-pay | Admitting: Urology

## 2022-08-26 ENCOUNTER — Ambulatory Visit (HOSPITAL_COMMUNITY): Payer: Medicare Other

## 2022-08-26 ENCOUNTER — Encounter (HOSPITAL_BASED_OUTPATIENT_CLINIC_OR_DEPARTMENT_OTHER)
Admission: RE | Disposition: A | Discharge status: Home / Self Care | Payer: Self-pay | Source: Home / Self Care | Attending: Urology

## 2022-08-26 ENCOUNTER — Ambulatory Visit (HOSPITAL_BASED_OUTPATIENT_CLINIC_OR_DEPARTMENT_OTHER)
Admission: RE | Admit: 2022-08-26 | Discharge: 2022-08-26 | Disposition: A | Discharge status: Home / Self Care | Payer: Medicare Other | Attending: Urology | Admitting: Urology

## 2022-08-26 DIAGNOSIS — Z01818 Encounter for other preprocedural examination: Secondary | ICD-10-CM | POA: Diagnosis not present

## 2022-08-26 DIAGNOSIS — N200 Calculus of kidney: Secondary | ICD-10-CM | POA: Diagnosis not present

## 2022-08-26 DIAGNOSIS — N202 Calculus of kidney with calculus of ureter: Secondary | ICD-10-CM | POA: Insufficient documentation

## 2022-08-26 HISTORY — PX: EXTRACORPOREAL SHOCK WAVE LITHOTRIPSY: SHX1557

## 2022-08-26 SURGERY — LITHOTRIPSY, ESWL
Anesthesia: LOCAL | Laterality: Right

## 2022-08-26 MED ORDER — SODIUM CHLORIDE 0.9 % IV SOLN: INTRAVENOUS | Status: DC

## 2022-08-26 MED ORDER — CIPROFLOXACIN HCL 500 MG PO TABS
500.0000 mg | ORAL_TABLET | Freq: Once | ORAL | Status: AC
  Administered 2022-08-26: 500 mg via ORAL

## 2022-08-26 MED ORDER — DIPHENHYDRAMINE HCL 25 MG PO CAPS
ORAL_CAPSULE | ORAL | Status: AC
  Filled 2022-08-26: qty 1

## 2022-08-26 MED ORDER — OXYCODONE-ACETAMINOPHEN 5-325 MG PO TABS: 1.0000 | ORAL_TABLET | ORAL | 0 refills | Status: DC | PRN

## 2022-08-26 MED ORDER — CIPROFLOXACIN HCL 500 MG PO TABS
ORAL_TABLET | ORAL | Status: AC
  Filled 2022-08-26: qty 1

## 2022-08-26 MED ORDER — SODIUM CHLORIDE 0.9% FLUSH: 3.0000 mL | Freq: Two times a day (BID) | INTRAVENOUS | Status: DC

## 2022-08-26 MED ORDER — DIPHENHYDRAMINE HCL 25 MG PO CAPS
25.0000 mg | ORAL_CAPSULE | Freq: Once | ORAL | Status: AC
  Administered 2022-08-26: 25 mg via ORAL

## 2022-08-26 MED ORDER — DIAZEPAM 5 MG PO TABS
10.0000 mg | ORAL_TABLET | Freq: Once | ORAL | Status: AC
  Administered 2022-08-26: 10 mg via ORAL

## 2022-08-26 MED ORDER — DIAZEPAM 5 MG PO TABS
ORAL_TABLET | ORAL | Status: AC
  Filled 2022-08-26: qty 2

## 2022-08-26 NOTE — Op Note (Signed)
See [facility] scanned note 

## 2022-08-26 NOTE — Discharge Instructions (Signed)

## 2022-08-26 NOTE — Interval H&P Note (Signed)
History and Physical Interval Note:  No change in right renal pelvic stone.   08/26/2022 7:20 AM  [name]  has presented today for surgery, with the diagnosis of RIGHT RENAL STONE.  The various methods of treatment have been discussed with the patient and family. After consideration of risks, benefits and other options for treatment, the patient has consented to  Procedure(s): RIGHT EXTRACORPOREAL SHOCK WAVE LITHOTRIPSY (ESWL) (Right) as a surgical intervention.  The patient's history has been reviewed, patient examined, no change in status, stable for surgery.  I have reviewed the patient's chart and labs.  Questions were answered to the patient's satisfaction.     Irine Seal

## 2022-08-26 NOTE — Interval H&P Note (Signed)
History and Physical Interval Note:  He was last seen and evaluated in the office on 07/30/22.   His UA then had >60 RBC's.  KUB showed a 0.8cm R renal pelvic stone.  He needs pain med.   08/26/2022 7:41 AM  [name]  has presented today for surgery, with the diagnosis of RIGHT RENAL STONE.  The various methods of treatment have been discussed with the patient and family. After consideration of risks, benefits and other options for treatment, the patient has consented to  Procedure(s): RIGHT EXTRACORPOREAL SHOCK WAVE LITHOTRIPSY (ESWL) (Right) as a surgical intervention.  The patient's history has been reviewed, patient examined, no change in status, stable for surgery.  I have reviewed the patient's chart and labs.  Questions were answered to the patient's satisfaction.     Irine Seal

## 2022-08-27 ENCOUNTER — Encounter (HOSPITAL_BASED_OUTPATIENT_CLINIC_OR_DEPARTMENT_OTHER): Payer: Self-pay | Admitting: Urology

## 2022-09-09 DIAGNOSIS — Z85828 Personal history of other malignant neoplasm of skin: Secondary | ICD-10-CM | POA: Diagnosis not present

## 2022-09-09 DIAGNOSIS — L570 Actinic keratosis: Secondary | ICD-10-CM | POA: Diagnosis not present

## 2022-09-09 DIAGNOSIS — N200 Calculus of kidney: Secondary | ICD-10-CM | POA: Diagnosis not present

## 2022-09-13 ENCOUNTER — Other Ambulatory Visit: Payer: Self-pay

## 2022-09-13 DIAGNOSIS — D509 Iron deficiency anemia, unspecified: Secondary | ICD-10-CM

## 2022-09-17 DIAGNOSIS — H40013 Open angle with borderline findings, low risk, bilateral: Secondary | ICD-10-CM | POA: Diagnosis not present

## 2022-09-18 ENCOUNTER — Telehealth: Payer: Self-pay | Admitting: Family

## 2022-09-18 NOTE — Telephone Encounter (Signed)
Patient wants a note for jury duty. Says he is unable to go due to his issues with balance. Please call back.

## 2022-09-19 DIAGNOSIS — D509 Iron deficiency anemia, unspecified: Secondary | ICD-10-CM | POA: Diagnosis not present

## 2022-09-19 LAB — CBC WITH DIFFERENTIAL/PLATELET
Absolute Monocytes: 518 cells/uL (ref 200–950)
Basophils Absolute: 90 cells/uL (ref 0–200)
Basophils Relative: 1.4 %
Eosinophils Absolute: 166 cells/uL (ref 15–500)
Eosinophils Relative: 2.6 %
HCT: 27.1 % — ABNORMAL LOW (ref 38.5–50.0)
Hemoglobin: 9 g/dL — ABNORMAL LOW (ref 13.2–17.1)
Lymphs Abs: 1677 cells/uL (ref 850–3900)
MCH: 34.1 pg — ABNORMAL HIGH (ref 27.0–33.0)
MCHC: 33.2 g/dL (ref 32.0–36.0)
MCV: 102.7 fL — ABNORMAL HIGH (ref 80.0–100.0)
MPV: 9.9 fL (ref 7.5–12.5)
Monocytes Relative: 8.1 %
Neutro Abs: 3949 cells/uL (ref 1500–7800)
Neutrophils Relative %: 61.7 %
Platelets: 448 10*3/uL — ABNORMAL HIGH (ref 140–400)
RBC: 2.64 10*6/uL — ABNORMAL LOW (ref 4.20–5.80)
RDW: 12.3 % (ref 11.0–15.0)
Total Lymphocyte: 26.2 %
WBC: 6.4 10*3/uL (ref 3.8–10.8)

## 2022-09-19 NOTE — Telephone Encounter (Signed)
Jury note sent to his MyChart.

## 2022-09-19 NOTE — Telephone Encounter (Signed)
Lm making pt aware

## 2022-09-23 ENCOUNTER — Other Ambulatory Visit: Payer: Self-pay | Admitting: Family

## 2022-09-23 ENCOUNTER — Other Ambulatory Visit: Payer: Self-pay

## 2022-09-23 DIAGNOSIS — I6930 Unspecified sequelae of cerebral infarction: Secondary | ICD-10-CM

## 2022-09-23 DIAGNOSIS — D509 Iron deficiency anemia, unspecified: Secondary | ICD-10-CM | POA: Diagnosis not present

## 2022-09-24 LAB — B12 AND FOLATE PANEL
Folate: >24 ng/mL
Vitamin B-12: 538 pg/mL (ref 200–1100)

## 2022-09-26 ENCOUNTER — Other Ambulatory Visit: Payer: Self-pay | Admitting: Gastroenterology

## 2022-10-08 DIAGNOSIS — N200 Calculus of kidney: Secondary | ICD-10-CM | POA: Diagnosis not present

## 2022-10-22 ENCOUNTER — Other Ambulatory Visit: Payer: Self-pay | Admitting: Gastroenterology

## 2022-10-23 ENCOUNTER — Ambulatory Visit: Payer: Medicare Other | Admitting: Gastroenterology

## 2022-11-04 ENCOUNTER — Ambulatory Visit: Payer: Medicare Other | Admitting: Gastroenterology

## 2022-11-04 ENCOUNTER — Encounter: Payer: Self-pay | Admitting: Gastroenterology

## 2022-11-04 VITALS — BP 103/71 | HR 102 | Temp 98.1°F | Ht 70.0 in | Wt 138.0 lb

## 2022-11-04 DIAGNOSIS — D509 Iron deficiency anemia, unspecified: Secondary | ICD-10-CM | POA: Diagnosis not present

## 2022-11-04 DIAGNOSIS — K529 Noninfective gastroenteritis and colitis, unspecified: Secondary | ICD-10-CM

## 2022-11-04 MED ORDER — CHOLESTYRAMINE 4 G PO PACK: 4.0000 g | PACK | Freq: Every day | ORAL | 12 refills | Status: DC

## 2022-11-04 MED ORDER — CYANOCOBALAMIN 1000 MCG/ML IJ SOLN: INTRAMUSCULAR | 3 refills | Status: DC

## 2022-11-04 MED ORDER — FERROUS SULFATE 325 (65 FE) MG PO TBEC: 1.0000 | DELAYED_RELEASE_TABLET | Freq: Two times a day (BID) | ORAL | 3 refills | Status: DC
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
## 2022-11-04 NOTE — Patient Instructions (Signed)
Complete blood work in 3-4 weeks or sooner if you feel like anemia is worse.  Continue ferrous sulfate, vitamin B12, imodium as before.  Start questran one pack daily. Take one hour after you take imodium at lunchtime. Do not take any medication for at least 3 hours after you take [place].  Weight yourself once every 1-2 weeks, first thing in the morning in similar clothing or unclothed. Let me know if your weight drops by 5 pounds.  We will touch base after your blood work in the next few weeks to see how your diarrhea is doing.

## 2022-12-03 DIAGNOSIS — K529 Noninfective gastroenteritis and colitis, unspecified: Secondary | ICD-10-CM | POA: Diagnosis not present

## 2022-12-03 DIAGNOSIS — D509 Iron deficiency anemia, unspecified: Secondary | ICD-10-CM | POA: Diagnosis not present

## 2022-12-04 ENCOUNTER — Telehealth: Payer: Self-pay

## 2022-12-04 LAB — CBC WITH DIFFERENTIAL/PLATELET
Basophils Absolute: 0.1 10*3/uL (ref 0.0–0.2)
Basos: 1 %
EOS (ABSOLUTE): 0.2 10*3/uL (ref 0.0–0.4)
Eos: 3 %
Hematocrit: 22.2 % — ABNORMAL LOW (ref 37.5–51.0)
Hemoglobin: 7.2 g/dL — ABNORMAL LOW (ref 13.0–17.7)
Immature Grans (Abs): 0 10*3/uL (ref 0.0–0.1)
Immature Granulocytes: 0 %
Lymphocytes Absolute: 1.2 10*3/uL (ref 0.7–3.1)
Lymphs: 23 %
MCH: 33.3 pg — ABNORMAL HIGH (ref 26.6–33.0)
MCHC: 32.4 g/dL (ref 31.5–35.7)
MCV: 103 fL — ABNORMAL HIGH (ref 79–97)
Monocytes Absolute: 0.6 10*3/uL (ref 0.1–0.9)
Monocytes: 11 %
Neutrophils Absolute: 3.3 10*3/uL (ref 1.4–7.0)
Neutrophils: 62 %
Platelets: 477 10*3/uL — ABNORMAL HIGH (ref 150–450)
RBC: 2.16 x10E6/uL — CL (ref 4.14–5.80)
RDW: 11.3 % — ABNORMAL LOW (ref 11.6–15.4)
WBC: 5.3 10*3/uL (ref 3.4–10.8)

## 2022-12-04 NOTE — Telephone Encounter (Signed)
Pt called regarding his lab results. Pt states that his hemoglobin is low again and is wanting to know what your recommendations are regarding this.

## 2022-12-04 NOTE — Telephone Encounter (Signed)
See result note.  

## 2022-12-09 ENCOUNTER — Other Ambulatory Visit (INDEPENDENT_AMBULATORY_CARE_PROVIDER_SITE_OTHER): Payer: Self-pay | Admitting: *Deleted

## 2022-12-09 ENCOUNTER — Encounter (INDEPENDENT_AMBULATORY_CARE_PROVIDER_SITE_OTHER): Payer: Self-pay | Admitting: *Deleted

## 2022-12-09 DIAGNOSIS — D509 Iron deficiency anemia, unspecified: Secondary | ICD-10-CM

## 2022-12-09 MED ORDER — PEG 3350-KCL-NA BICARB-NACL 420 G PO SOLR: 4000.0000 mL | Freq: Once | ORAL | 0 refills | Status: AC
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
## 2022-12-10 ENCOUNTER — Inpatient Hospital Stay: Payer: Medicare Other | Attending: Hematology | Admitting: Hematology

## 2022-12-10 ENCOUNTER — Encounter: Payer: Self-pay | Admitting: Hematology

## 2022-12-10 ENCOUNTER — Inpatient Hospital Stay: Payer: Medicare Other

## 2022-12-10 VITALS — BP 129/90 | HR 86 | Temp 97.6°F | Resp 18 | Ht 69.29 in | Wt 137.3 lb

## 2022-12-10 DIAGNOSIS — D509 Iron deficiency anemia, unspecified: Secondary | ICD-10-CM | POA: Diagnosis not present

## 2022-12-10 DIAGNOSIS — D649 Anemia, unspecified: Secondary | ICD-10-CM

## 2022-12-10 LAB — IRON AND TIBC
Iron: 69 ug/dL (ref 45–182)
Saturation Ratios: 13 % — ABNORMAL LOW (ref 17.9–39.5)
TIBC: 538 ug/dL — ABNORMAL HIGH (ref 250–450)
UIBC: 469 ug/dL

## 2022-12-10 LAB — DIRECT ANTIGLOBULIN TEST (NOT AT ARMC)
DAT, IgG: NEGATIVE
DAT, complement: NEGATIVE

## 2022-12-10 LAB — RETICULOCYTES
Immature Retic Fract: 36 % — ABNORMAL HIGH (ref 2.3–15.9)
RBC.: 2.27 MIL/uL — ABNORMAL LOW (ref 4.22–5.81)
Retic Count, Absolute: 163.2 10*3/uL (ref 19.0–186.0)
Retic Ct Pct: 7.2 % — ABNORMAL HIGH (ref 0.4–3.1)

## 2022-12-10 LAB — LACTATE DEHYDROGENASE: LDH: 82 U/L — ABNORMAL LOW (ref 98–192)

## 2022-12-10 LAB — VITAMIN B12: Vitamin B-12: 734 pg/mL (ref 180–914)

## 2022-12-10 LAB — SAMPLE TO BLOOD BANK

## 2022-12-10 LAB — FOLATE: Folate: 39.5 ng/mL (ref >5.9)

## 2022-12-10 LAB — FERRITIN: Ferritin: 12 ng/mL — ABNORMAL LOW (ref 24–336)

## 2022-12-10 NOTE — Patient Instructions (Addendum)
[place]  Discharge Instructions  You were seen and examined today by Dr. Delton Coombes.   He is a Merchandiser, retail meaning that he specializes in the treatment of blood disorders and cancers.   He discussed your medical history and the events that led up to you being here today.    Dr. Delton Coombes is going to check some further labs and will see you back in 1-2 weeks to discuss the results. Depending on what your iron level shows today Dr. Delton Coombes will give you IV iron infusions.     Thank you for choosing [facility] to provide your oncology and hematology care.   To afford each patient quality time with our provider, please arrive at least 15 minutes before your scheduled appointment time. You may need to reschedule your appointment if you arrive late (10 or more minutes). Arriving late affects you and other patients whose appointments are after yours.  Also, if you miss three or more appointments without notifying the office, you may be dismissed from the clinic at the provider's discretion.    Again, thank you for choosing [facility].  Our hope is that these requests will decrease the amount of time that you wait before being seen by our physicians.   If you have a lab appointment with the [facility] please come in thru the Main Entrance and check in at the main information desk.           _____________________________________________________________  Should you have questions after your visit to [facility], please contact our office at [phone] and follow the prompts.  Our office hours are 8:00 a.m. to 4:30 p.m. Monday - Thursday and 8:00 a.m. to 2:30 p.m. Friday.  Please note that voicemails left after 4:00 p.m. may not be returned until the following business day.  We are closed weekends and all major holidays.  You do have access to a nurse 24-7, just call the main number to  the clinic [phone] and do not press any options, hold on the line and a nurse will answer the phone.    For prescription refill requests, have your pharmacy contact our office and allow 72 hours.    Masks are optional in the cancer centers. If you would like for your care team to wear a mask while they are taking care of you, please let them know. You may have one support person who is at least 63 years old accompany you for your appointments.
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
## 2022-12-12 LAB — KAPPA/LAMBDA LIGHT CHAINS
Kappa free light chain: 26.2 mg/L — ABNORMAL HIGH (ref 3.3–19.4)
Kappa, lambda light chain ratio: 1.45 (ref 0.26–1.65)
Lambda free light chains: 18.1 mg/L (ref 5.7–26.3)

## 2022-12-13 LAB — COPPER, SERUM: Copper: 96 ug/dL (ref 69–132)

## 2022-12-13 NOTE — Patient Instructions (Signed)
Your procedure is scheduled on: 12/19/2022  Report to [place] Entrance at   10:30  AM.  Call this number if you have problems the morning of surgery: [phone]   Remember:              Follow Directions on the letter you received from Your Physician's office regarding the Bowel Prep              No Smoking the day of Procedure :   Take these medicines the morning of surgery with A SIP OF WATER: none   Do not wear jewelry, make-up or nail polish.    Do not bring valuables to the hospital.  Contacts, dentures or bridgework may not be worn into surgery.  .   Patients discharged the day of surgery will not be allowed to drive home.     Colonoscopy, Adult, Care After This sheet gives you information about how to care for yourself after your procedure. Your health care provider may also give you more specific instructions. If you have problems or questions, contact your health care provider. What can I expect after the procedure? After the procedure, it is common to have: A small amount of blood in your stool for 24 hours after the procedure. Some gas. Mild abdominal cramping or bloating.  Follow these instructions at home: General instructions  For the first 24 hours after the procedure: Do not drive or use machinery. Do not sign important documents. Do not drink alcohol. Do your regular daily activities at a slower pace than normal. Eat soft, easy-to-digest foods. Rest often. Take over-the-counter or prescription medicines only as told by your health care provider. It is up to you to get the results of your procedure. Ask your health care provider, or the department performing the procedure, when your results will be ready. Relieving cramping and bloating Try walking around when you have cramps or feel bloated. Apply heat to your abdomen as told by your health care provider. Use a heat source that your health care provider recommends, such as a moist heat pack or a  heating pad. Place a towel between your skin and the heat source. Leave the heat on for 20-30 minutes. Remove the heat if your skin turns bright red. This is especially important if you are unable to feel pain, heat, or cold. You may have a greater risk of getting burned. Eating and drinking Drink enough fluid to keep your urine clear or pale yellow. Resume your normal diet as instructed by your health care provider. Avoid heavy or fried foods that are hard to digest. Avoid drinking alcohol for as long as instructed by your health care provider. Contact a health care provider if: You have blood in your stool 2-3 days after the procedure. Get help right away if: You have more than a small spotting of blood in your stool. You pass large blood clots in your stool. Your abdomen is swollen. You have nausea or vomiting. You have a fever. You have increasing abdominal pain that is not relieved with medicine. This information is not intended to replace advice given to you by your health care provider. Make sure you discuss any questions you have with your health care provider. Document Released: 05/07/2004 Document Revised: 06/17/2016 Document Reviewed: 12/05/2015 Elsevier Interactive Patient Education  2018 [facility], Adult, Care After After the procedure, it is common to have a sore throat. It is also common to have: Mild stomach pain or  discomfort. Bloating. Nausea. Follow these instructions at home: The instructions below may help you care for yourself at home. Your health care provider may give you more instructions. If you have questions, ask your health care provider. If you were given a sedative during the procedure, it can affect you for several hours. Do not drive or operate machinery until your health care provider says that it is safe. If you will be going home right after the procedure, plan to have a responsible adult: Take you home from the hospital or clinic.  You will not be allowed to drive. Care for you for the time you are told. Follow instructions from your health care provider about what you may eat and drink. Return to your normal activities as told by your health care provider. Ask your health care provider what activities are safe for you. Take over-the-counter and prescription medicines only as told by your health care provider. Contact a health care provider if you: Have a sore throat that lasts longer than one day. Have trouble swallowing. Have a fever. Get help right away if you: Vomit blood or your vomit looks like coffee grounds. Have bloody, black, or tarry stools. Have a very bad sore throat or you cannot swallow. Have difficulty breathing or very bad pain in your chest or abdomen. These symptoms may be an emergency. Get help right away. Call 911. Do not wait to see if the symptoms will go away. Do not drive yourself to the hospital. Summary After the procedure, it is common to have a sore throat, mild stomach discomfort, bloating, and nausea. If you were given a sedative during the procedure, it can affect you for several hours. Do not drive until your health care provider says that it is safe. Follow instructions from your health care provider about what you may eat and drink. Return to your normal activities as told by your health care provider. This information is not intended to replace advice given to you by your health care provider. Make sure you discuss any questions you have with your health care provider. Document Revised: 01/02/2022 Document Reviewed: 01/02/2022 Elsevier Patient Education  [place].

## 2022-12-16 ENCOUNTER — Inpatient Hospital Stay: Payer: Medicare Other

## 2022-12-16 VITALS — BP 117/81 | HR 78 | Temp 97.4°F | Resp 18

## 2022-12-16 DIAGNOSIS — D509 Iron deficiency anemia, unspecified: Secondary | ICD-10-CM

## 2022-12-16 LAB — METHYLMALONIC ACID, SERUM: Methylmalonic Acid, Quantitative: 151 nmol/L (ref 0–378)

## 2022-12-16 MED ORDER — SODIUM CHLORIDE 0.9 % IV SOLN: Freq: Once | INTRAVENOUS | Status: AC

## 2022-12-16 MED ORDER — SODIUM CHLORIDE 0.9 % IV SOLN
510.0000 mg | Freq: Once | INTRAVENOUS | Status: AC
  Administered 2022-12-16: 510 mg via INTRAVENOUS
  Filled 2022-12-16: qty 510

## 2022-12-16 NOTE — Progress Notes (Signed)
Pt presents today for Feraheme IV iron infusion per provider's order. Vital signs stable and pt voiced no new complaints at this time.  Peripheral IV started with good blood return pre and post infusion.  Feraheme given today per MD orders. Tolerated infusion without adverse affects. Vital signs stable. No complaints at this time. Discharged from clinic ambulatory in stable condition. Alert and oriented x 3. F/U with [facility] as scheduled.    

## 2022-12-16 NOTE — Patient Instructions (Signed)
[facility]  Discharge Instructions: Thank you for choosing [facility] to provide your oncology and hematology care.  If you have a lab appointment with the [facility], please come in thru the Main Entrance and check in at the main information desk.  Wear comfortable clothing and clothing appropriate for easy access to any Portacath or PICC line.   We strive to give you quality time with your provider. You may need to reschedule your appointment if you arrive late (15 or more minutes).  Arriving late affects you and other patients whose appointments are after yours.  Also, if you miss three or more appointments without notifying the office, you may be dismissed from the clinic at the provider's discretion.      For prescription refill requests, have your pharmacy contact our office and allow 72 hours for refills to be completed.    Today you received Feraheme IV iron infusion.   To help prevent nausea and vomiting after your treatment, we encourage you to take your nausea medication as directed.  BELOW ARE SYMPTOMS THAT SHOULD BE REPORTED IMMEDIATELY: *FEVER GREATER THAN 100.4 F (38 C) OR HIGHER *CHILLS OR SWEATING *NAUSEA AND VOMITING THAT IS NOT CONTROLLED WITH YOUR NAUSEA MEDICATION *UNUSUAL SHORTNESS OF BREATH *UNUSUAL BRUISING OR BLEEDING *URINARY PROBLEMS (pain or burning when urinating, or frequent urination) *BOWEL PROBLEMS (unusual diarrhea, constipation, pain near the anus) TENDERNESS IN MOUTH AND THROAT WITH OR WITHOUT PRESENCE OF ULCERS (sore throat, sores in mouth, or a toothache) UNUSUAL RASH, SWELLING OR PAIN  UNUSUAL VAGINAL DISCHARGE OR ITCHING   Items with * indicate a potential emergency and should be followed up as soon as possible or go to the Emergency Department if any problems should occur.  Please show the CHEMOTHERAPY ALERT CARD or IMMUNOTHERAPY ALERT CARD at check-in to the Emergency Department and triage nurse.  Should  you have questions after your visit or need to cancel or reschedule your appointment, please contact [facility] [phone]  and follow the prompts.  Office hours are 8:00 a.m. to 4:30 p.m. Monday - Friday. Please note that voicemails left after 4:00 p.m. may not be returned until the following business day.  We are closed weekends and major holidays. You have access to a nurse at all times for urgent questions. Please call the main number to the clinic [phone] and follow the prompts.  For any non-urgent questions, you may also contact your provider using MyChart. We now offer e-Visits for anyone 107 and older to request care online for non-urgent symptoms. For details visit [email or web address].   Also download the MyChart app! Go to the app store, search "MyChart", open the app, select [facility], and log in with your MyChart username and password.

## 2022-12-17 ENCOUNTER — Encounter (HOSPITAL_COMMUNITY): Payer: Self-pay

## 2022-12-17 ENCOUNTER — Encounter (HOSPITAL_COMMUNITY)
Admission: RE | Admit: 2022-12-17 | Discharge: 2022-12-17 | Disposition: A | Discharge status: Home / Self Care | Payer: Medicare Other | Source: Ambulatory Visit | Attending: Internal Medicine | Admitting: Internal Medicine

## 2022-12-17 VITALS — BP 102/53 | HR 92 | Temp 97.5°F | Resp 18 | Ht 69.0 in | Wt 137.3 lb

## 2022-12-17 DIAGNOSIS — D500 Iron deficiency anemia secondary to blood loss (chronic): Secondary | ICD-10-CM

## 2022-12-17 DIAGNOSIS — I10 Essential (primary) hypertension: Secondary | ICD-10-CM | POA: Diagnosis not present

## 2022-12-17 DIAGNOSIS — E871 Hypo-osmolality and hyponatremia: Secondary | ICD-10-CM | POA: Insufficient documentation

## 2022-12-17 DIAGNOSIS — Z01818 Encounter for other preprocedural examination: Secondary | ICD-10-CM | POA: Diagnosis not present

## 2022-12-17 DIAGNOSIS — D638 Anemia in other chronic diseases classified elsewhere: Secondary | ICD-10-CM

## 2022-12-17 DIAGNOSIS — D649 Anemia, unspecified: Secondary | ICD-10-CM | POA: Insufficient documentation

## 2022-12-17 LAB — CBC WITH DIFFERENTIAL/PLATELET
Abs Immature Granulocytes: 0.02 10*3/uL (ref 0.00–0.07)
Basophils Absolute: 0.1 10*3/uL (ref 0.0–0.1)
Basophils Relative: 1 %
Eosinophils Absolute: 0.1 10*3/uL (ref 0.0–0.5)
Eosinophils Relative: 2 %
HCT: 23.3 % — ABNORMAL LOW (ref 39.0–52.0)
Hemoglobin: 6.8 g/dL — CL (ref 13.0–17.0)
Immature Granulocytes: 0 %
Lymphocytes Relative: 16 %
Lymphs Abs: 1.2 10*3/uL (ref 0.7–4.0)
MCH: 32.2 pg (ref 26.0–34.0)
MCHC: 29.2 g/dL — ABNORMAL LOW (ref 30.0–36.0)
MCV: 110 fL — ABNORMAL HIGH (ref 80.0–100.0)
Monocytes Absolute: 0.7 10*3/uL (ref 0.1–1.0)
Monocytes Relative: 9 %
Neutro Abs: 5.7 10*3/uL (ref 1.7–7.7)
Neutrophils Relative %: 72 %
Platelets: 441 10*3/uL — ABNORMAL HIGH (ref 150–400)
RBC: 2.11 MIL/uL — ABNORMAL LOW (ref 4.22–5.81)
RDW: 12.4 % (ref 11.5–15.5)
WBC: 7.7 10*3/uL (ref 4.0–10.5)
nRBC: 0 % (ref 0.0–0.2)

## 2022-12-17 LAB — PROTEIN ELECTROPHORESIS, SERUM
A/G Ratio: 1.3 (ref 0.7–1.7)
Albumin ELP: 3.4 g/dL (ref 2.9–4.4)
Alpha-1-Globulin: 0.3 g/dL (ref 0.0–0.4)
Alpha-2-Globulin: 0.6 g/dL (ref 0.4–1.0)
Beta Globulin: 1.2 g/dL (ref 0.7–1.3)
Gamma Globulin: 0.6 g/dL (ref 0.4–1.8)
Globulin, Total: 2.6 g/dL (ref 2.2–3.9)
Total Protein ELP: 6 g/dL (ref 6.0–8.5)

## 2022-12-17 LAB — BASIC METABOLIC PANEL
Anion gap: 8 (ref 5–15)
BUN: 20 mg/dL (ref 8–23)
CO2: 24 mmol/L (ref 22–32)
Calcium: 8.4 mg/dL — ABNORMAL LOW (ref 8.9–10.3)
Chloride: 107 mmol/L (ref 98–111)
Creatinine, Ser: 0.87 mg/dL (ref 0.61–1.24)
GFR, Estimated: >60 mL/min (ref >60)
Glucose, Bld: 125 mg/dL — ABNORMAL HIGH (ref 70–99)
Potassium: 4.1 mmol/L (ref 3.5–5.1)
Sodium: 139 mmol/L (ref 135–145)

## 2022-12-18 ENCOUNTER — Telehealth: Payer: Self-pay

## 2022-12-18 ENCOUNTER — Encounter (HOSPITAL_COMMUNITY)
Admission: RE | Admit: 2022-12-18 | Discharge: 2022-12-18 | Disposition: A | Discharge status: Home / Self Care | Payer: Medicare Other | Source: Ambulatory Visit | Attending: Internal Medicine | Admitting: Internal Medicine

## 2022-12-18 DIAGNOSIS — K626 Ulcer of anus and rectum: Secondary | ICD-10-CM | POA: Diagnosis not present

## 2022-12-18 DIAGNOSIS — D638 Anemia in other chronic diseases classified elsewhere: Secondary | ICD-10-CM | POA: Insufficient documentation

## 2022-12-18 DIAGNOSIS — K284 Chronic or unspecified gastrojejunal ulcer with hemorrhage: Secondary | ICD-10-CM | POA: Diagnosis not present

## 2022-12-18 LAB — IMMUNOFIXATION ELECTROPHORESIS
IgA: 317 mg/dL (ref 61–437)
IgG (Immunoglobin G), Serum: 695 mg/dL (ref 603–1613)
IgM (Immunoglobulin M), Srm: 21 mg/dL (ref 20–172)
Total Protein ELP: 5.9 g/dL — ABNORMAL LOW (ref 6.0–8.5)

## 2022-12-18 LAB — PREPARE RBC (CROSSMATCH)

## 2022-12-18 MED ORDER — SODIUM CHLORIDE 0.9% IV SOLUTION: Freq: Once | INTRAVENOUS | Status: AC

## 2022-12-18 NOTE — Progress Notes (Signed)
Patient was scheduled for a blood transfusion today (1 Unit) in the clinic.  We will repeat the H&H in the am on arrival.

## 2022-12-18 NOTE — Addendum Note (Signed)
Encounter addended by: Binnie Kand, RN on: 12/18/2022 3:00 PM  Actions taken: LDA properties accepted

## 2022-12-18 NOTE — Telephone Encounter (Signed)
-----   Message from Daneil Dolin, MD sent at 12/17/2022  3:20 PM EDT ----- Regarding: anemia Hemoglobin 6.8 noted.  Lets arrange to give him 1 unit of packed RBCs tomorrow before his procedure Thursday.

## 2022-12-18 NOTE — Telephone Encounter (Signed)
Olivia Mackie just called regarding this, an order may need to be signed or entered in to the computer for this. I am unable to do those kind of orders. She is working on seeing what needs to be done.

## 2022-12-18 NOTE — Progress Notes (Signed)
Diagnosis: Acute Anemia  Provider:   Manus Rudd MD  Procedure: Infusion  IV Type: Peripheral, IV Location: R Forearm  PRBCs, Dose: 1 Unit  Infusion Start Time: [id number]  Infusion Stop Time: [id number]  Post Infusion IV Care: Patient declined observation and Peripheral IV Discontinued  Discharge: Condition: Stable, Destination: Home . AVS Declined  Performed by:  Binnie Kand, RN

## 2022-12-19 ENCOUNTER — Ambulatory Visit (HOSPITAL_COMMUNITY): Payer: Medicare Other | Admitting: Certified Registered"

## 2022-12-19 ENCOUNTER — Ambulatory Visit (HOSPITAL_BASED_OUTPATIENT_CLINIC_OR_DEPARTMENT_OTHER): Payer: Medicare Other | Admitting: Certified Registered"

## 2022-12-19 ENCOUNTER — Encounter (HOSPITAL_COMMUNITY)
Admission: RE | Disposition: A | Discharge status: Home / Self Care | Payer: Self-pay | Source: Home / Self Care | Attending: Internal Medicine

## 2022-12-19 ENCOUNTER — Ambulatory Visit (HOSPITAL_COMMUNITY)
Admission: RE | Admit: 2022-12-19 | Discharge: 2022-12-19 | Disposition: A | Discharge status: Home / Self Care | Payer: Medicare Other | Attending: Internal Medicine | Admitting: Internal Medicine

## 2022-12-19 ENCOUNTER — Ambulatory Visit: Payer: Medicare Other | Admitting: Physician Assistant

## 2022-12-19 ENCOUNTER — Telehealth: Payer: Self-pay

## 2022-12-19 ENCOUNTER — Encounter (HOSPITAL_COMMUNITY): Payer: Self-pay | Admitting: Internal Medicine

## 2022-12-19 DIAGNOSIS — K289 Gastrojejunal ulcer, unspecified as acute or chronic, without hemorrhage or perforation: Secondary | ICD-10-CM | POA: Diagnosis not present

## 2022-12-19 DIAGNOSIS — K90829 Short bowel syndrome, unspecified: Secondary | ICD-10-CM | POA: Insufficient documentation

## 2022-12-19 DIAGNOSIS — K633 Ulcer of intestine: Secondary | ICD-10-CM | POA: Diagnosis not present

## 2022-12-19 DIAGNOSIS — K529 Noninfective gastroenteritis and colitis, unspecified: Secondary | ICD-10-CM | POA: Diagnosis not present

## 2022-12-19 DIAGNOSIS — I10 Essential (primary) hypertension: Secondary | ICD-10-CM | POA: Insufficient documentation

## 2022-12-19 DIAGNOSIS — F418 Other specified anxiety disorders: Secondary | ICD-10-CM | POA: Diagnosis not present

## 2022-12-19 DIAGNOSIS — R195 Other fecal abnormalities: Secondary | ICD-10-CM | POA: Diagnosis not present

## 2022-12-19 DIAGNOSIS — K6389 Other specified diseases of intestine: Secondary | ICD-10-CM | POA: Diagnosis not present

## 2022-12-19 DIAGNOSIS — D509 Iron deficiency anemia, unspecified: Secondary | ICD-10-CM | POA: Diagnosis not present

## 2022-12-19 DIAGNOSIS — D500 Iron deficiency anemia secondary to blood loss (chronic): Secondary | ICD-10-CM

## 2022-12-19 DIAGNOSIS — D638 Anemia in other chronic diseases classified elsewhere: Secondary | ICD-10-CM

## 2022-12-19 HISTORY — PX: ESOPHAGOGASTRODUODENOSCOPY (EGD) WITH PROPOFOL: SHX5813

## 2022-12-19 HISTORY — PX: BIOPSY: SHX5522

## 2022-12-19 HISTORY — PX: FLEXIBLE SIGMOIDOSCOPY: SHX5431

## 2022-12-19 HISTORY — PX: ENTEROSCOPY: SHX5533

## 2022-12-19 LAB — TYPE AND SCREEN
ABO/RH(D): A POS
Antibody Screen: NEGATIVE
Unit division: 0

## 2022-12-19 LAB — BPAM RBC
Blood Product Expiration Date: 202404112359
ISSUE DATE / TIME: 202403131152
Unit Type and Rh: 6200

## 2022-12-19 LAB — HEMOGLOBIN AND HEMATOCRIT, BLOOD
HCT: 35.5 % — ABNORMAL LOW (ref 39.0–52.0)
Hemoglobin: 10.3 g/dL — ABNORMAL LOW (ref 13.0–17.0)

## 2022-12-19 SURGERY — ESOPHAGOGASTRODUODENOSCOPY (EGD) WITH PROPOFOL
Anesthesia: General

## 2022-12-19 MED ORDER — STERILE WATER FOR IRRIGATION IR SOLN
Status: DC | PRN
  Administered 2022-12-19: 150 mL

## 2022-12-19 MED ORDER — LIDOCAINE HCL (CARDIAC) PF 100 MG/5ML IV SOSY
PREFILLED_SYRINGE | INTRAVENOUS | Status: DC | PRN
  Administered 2022-12-19: 50 mg via INTRAVENOUS

## 2022-12-19 MED ORDER — DEXMEDETOMIDINE HCL IN NACL 80 MCG/20ML IV SOLN
INTRAVENOUS | Status: DC | PRN
  Administered 2022-12-19 (×2): 8 ug via BUCCAL
  Administered 2022-12-19: 4 ug via BUCCAL
  Administered 2022-12-19: 8 ug via BUCCAL

## 2022-12-19 MED ORDER — MESALAMINE 1000 MG RE SUPP: 1000.0000 mg | Freq: Every day | RECTAL | 3 refills | Status: DC

## 2022-12-19 MED ORDER — PROPOFOL 10 MG/ML IV BOLUS
INTRAVENOUS | Status: DC | PRN
  Administered 2022-12-19 (×2): 50 mg via INTRAVENOUS
  Administered 2022-12-19: 100 mg via INTRAVENOUS

## 2022-12-19 MED ORDER — PHENYLEPHRINE 80 MCG/ML (10ML) SYRINGE FOR IV PUSH (FOR BLOOD PRESSURE SUPPORT)
PREFILLED_SYRINGE | INTRAVENOUS | Status: DC | PRN
  Administered 2022-12-19 (×2): 160 ug via INTRAVENOUS
  Administered 2022-12-19: 80 ug via INTRAVENOUS

## 2022-12-19 MED ORDER — LACTATED RINGERS IV SOLN: INTRAVENOUS | Status: DC | PRN

## 2022-12-19 MED ORDER — PHENYLEPHRINE 80 MCG/ML (10ML) SYRINGE FOR IV PUSH (FOR BLOOD PRESSURE SUPPORT)
PREFILLED_SYRINGE | INTRAVENOUS | Status: AC
  Filled 2022-12-19: qty 10

## 2022-12-19 MED ORDER — PROPOFOL 500 MG/50ML IV EMUL
INTRAVENOUS | Status: DC | PRN
  Administered 2022-12-19: 150 ug/kg/min via INTRAVENOUS

## 2022-12-19 NOTE — Transfer of Care (Signed)
Immediate Anesthesia Transfer of Care Note  Patient: [name]  Procedure(s) Performed: ESOPHAGOGASTRODUODENOSCOPY (EGD) WITH PROPOFOL ENTEROSCOPY BIOPSY FLEXIBLE SIGMOIDOSCOPY  Patient Location: Short Stay  Anesthesia Type:General  Level of Consciousness: drowsy  Airway & Oxygen Therapy: Patient Spontanous Breathing  Post-op Assessment: Report given to RN and Post -op Vital signs reviewed and stable  Post vital signs: Reviewed and stable  Last Vitals:  Vitals Value Taken Time  BP    Temp    Pulse    Resp    SpO2      Last Pain:  Vitals:   12/19/22 1227  TempSrc:   PainSc: 0-No pain         Complications: No notable events documented.

## 2022-12-19 NOTE — Anesthesia Procedure Notes (Signed)
Date/Time: 12/19/2022 12:35 PM  Performed by: Orlie Dakin, CRNAPre-anesthesia Checklist: Patient identified, Emergency Drugs available, Suction available and Patient being monitored Patient Re-evaluated:Patient Re-evaluated prior to induction Oxygen Delivery Method: Nasal cannula Placement Confirmation: positive ETCO2
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
## 2022-12-19 NOTE — Op Note (Signed)
[facility] Patient Name: [name] Procedure Date: 12/19/2022 12:18 PM MRN: [id number] Date of Birth: [birth year] Attending MD: Norvel Richards , MD, [id number] CSN: [id number] Age: 62 Admit Type: Outpatient Procedure:                Sigmoidoscopy/Ileoscopy with biopsy Indications:              Iron deficiency anemia; Hemoccult positive stool Providers:                Norvel Richards, MD, Charlsie Quest. Theda Sers RN, RN,                            Wynonia Musty Tech, Technician Referring MD:              Medicines:                Propofol per Anesthesia Complications:            No immediate complications. Estimated Blood Loss:     Estimated blood loss was minimal. Procedure:                Pre-Anesthesia Assessment:                           - Prior to the procedure, a History and Physical                            was performed, and patient medications and                            allergies were reviewed. The patient's tolerance of                            previous anesthesia was also reviewed. The risks                            and benefits of the procedure and the sedation                            options and risks were discussed with the patient.                            All questions were answered, and informed consent                            was obtained. Prior Anticoagulants: The patient has                            taken no anticoagulant or antiplatelet agents. ASA                            Grade Assessment: III - A patient with severe                            systemic disease. After reviewing the risks and  benefits, the patient was deemed in satisfactory                            condition to undergo the procedure.                           After obtaining informed consent, the colonoscope                            was passed under direct vision. Throughout the                            procedure, the patient's  blood pressure, pulse, and                            oxygen saturations were monitored continuously. The                            [id number] scope was introduced through                            the anus and advanced to the the ileocolonic                            anastomosis. The colonoscopy was performed without                            difficulty. The patient tolerated the procedure                            well. The quality of the bowel preparation was                            adequate. Scope In: 12:57:10 AM Scope Out: 1:15:27 PM Total Procedure Duration: 12 hours 18 minutes 17 seconds  Findings:      The perianal and digital rectal examinations were normal. Residual       rectal mucosa appeared normal. Apparent surgical anastomosis with a       short colonic segment noted. circumferential friability and ulceration       of the mucosa overlying the anastomosis.. Proximal to this anastomosis       there did appear to be a short dilated segment of colon with anastomosis       to neoileum - semicircumferential area of erosion and superficial       ulceration at this anastomosis on the opposite wall there was also       crescent shaped area of ulceration associated with a blind pouch and       suture material protruding. I advanced the scope through the neoterminal       ileum a good 40 cm. The small bowel to this level appeared entirely       normal I pulled the scope down into the short dilated colonic segment       the mucosa. There was granularity diffusely of the short colonic       segment. Mucosa of short colonic segment biopsied. Also, I  elected to       biopsy ulceration at the anastomosis between colon and rectum. I was       able to retroflex and look at the distal rectum and anal verge; this       area appeared entirely normal. Impression:               - Remnant rectum appeared normal anastomosis short                            colonic segment  circumferentially ulcerated also,                            ulceration of Colo ileal anastomosis anastomosis.                           -Focal area of superficial erosion and ulceration                            at site of surgical sutures in the short colonic                            segment. Granularity of the short colonic segment                            of uncertain significance status post biopsy.                           -I biopsied the ulcer at the colorectal anastomosis.                           -Today's findings likely explains in large part                            iron deficiency anemia via mechanism slow GI blood                            loss. Inflammatory changes much much worse than                            seen by Dr. Laural Golden in 2021. I suspect ischemia. He                            denies NSAID use. Moderate Sedation:      Moderate (conscious) sedation was personally administered by an       anesthesia professional. The following parameters were monitored: oxygen       saturation, heart rate, blood pressure, respiratory rate, EKG, adequacy       of pulmonary ventilation, and response to care. Recommendation:           - Patient has a contact number available for                            emergencies. The signs and symptoms of potential  delayed complications were discussed with the                            patient. Return to normal activities tomorrow.                            Written discharge instructions were provided to the                            patient.                           - Advance diet as tolerated. Begin mesalamine are                            away so 1 g suppositories 1 per rectum at bedtime.                            New prescription supplied through the office.                           -CBC in 2 weeks; office visit in 4 to 6 weeks. See                            enteroscopy report. Procedure Code(s):         --- Professional ---                           [phone], Colonoscopy, flexible; diagnostic, including                            collection of specimen(s) by brushing or washing,                            when performed (separate procedure) Diagnosis Code(s):        --- Professional ---                           D50.9, Iron deficiency anemia, unspecified CPT copyright 2022 American Medical Association. All rights reserved. The codes documented in this report are preliminary and upon coder review may  be revised to meet current compliance requirements. Cristopher Estimable. Ayonna Speranza, MD Norvel Richards, MD 12/19/2022 1:40:20 PM This report has been signed electronically. Number of Addenda: 0

## 2022-12-19 NOTE — Anesthesia Preprocedure Evaluation (Signed)
Anesthesia Evaluation  Patient identified by MRN, date of birth, ID band Patient awake    Reviewed: Allergy & Precautions, H&P , NPO status , Patient's Chart, lab work & pertinent test results, reviewed documented beta blocker date and time   Airway Mallampati: II  TM Distance: >3 FB Neck ROM: full    Dental no notable dental hx.    Pulmonary neg pulmonary ROS   Pulmonary exam normal breath sounds clear to auscultation       Cardiovascular Exercise Tolerance: Good hypertension, negative cardio ROS  Rhythm:regular Rate:Normal     Neuro/Psych  PSYCHIATRIC DISORDERS Anxiety Depression    CVA negative neurological ROS  negative psych ROS   GI/Hepatic negative GI ROS, Neg liver ROS,,,  Endo/Other  negative endocrine ROS    Renal/GU Renal diseasenegative Renal ROS  negative genitourinary   Musculoskeletal   Abdominal   Peds  Hematology negative hematology ROS (+) Blood dyscrasia, anemia   Anesthesia Other Findings   Reproductive/Obstetrics negative OB ROS                             Anesthesia Physical Anesthesia Plan  ASA: 3  Anesthesia Plan: General   Post-op Pain Management:    Induction:   PONV Risk Score and Plan: Propofol infusion  Airway Management Planned:   Additional Equipment:   Intra-op Plan:   Post-operative Plan:   Informed Consent: I have reviewed the patients History and Physical, chart, labs and discussed the procedure including the risks, benefits and alternatives for the proposed anesthesia with the patient or authorized representative who has indicated his/her understanding and acceptance.     Dental Advisory Given  Plan Discussed with: CRNA  Anesthesia Plan Comments:        Anesthesia Quick Evaluation

## 2022-12-19 NOTE — Telephone Encounter (Signed)
Rx sent to pharmacy on file.

## 2022-12-19 NOTE — Discharge Instructions (Addendum)
Enteroscopy Discharge instructions Please read the instructions outlined below and refer to this sheet in the next few weeks. These discharge instructions provide you with general information on caring for yourself after you leave the hospital. Your doctor may also give you specific instructions. While your treatment has been planned according to the most current medical practices available, unavoidable complications occasionally occur. If you have any problems or questions after discharge, please call your doctor. ACTIVITY You may resume your regular activity but move at a slower pace for the next 24 hours.  Take frequent rest periods for the next 24 hours.  Walking will help expel (get rid of) the air and reduce the bloated feeling in your abdomen.  No driving for 24 hours (because of the anesthesia (medicine) used during the test).  You may shower.  Do not sign any important legal documents or operate any machinery for 24 hours (because of the anesthesia used during the test).  NUTRITION Drink plenty of fluids.  You may resume your normal diet.  Begin with a light meal and progress to your normal diet.  Avoid alcoholic beverages for 24 hours or as instructed by your caregiver.  MEDICATIONS You may resume your normal medications unless your caregiver tells you otherwise.  WHAT YOU CAN EXPECT TODAY You may experience abdominal discomfort such as a feeling of fullness or "gas" pains.  FOLLOW-UP Your doctor will discuss the results of your test with you.  SEEK IMMEDIATE MEDICAL ATTENTION IF ANY OF THE FOLLOWING OCCUR: Excessive nausea (feeling sick to your stomach) and/or vomiting.  Severe abdominal pain and distention (swelling).  Trouble swallowing.  Temperature over 101 F (37.8 C).  Rectal bleeding or vomiting of blood.      sigmoidoscopy Discharge Instructions  Read the instructions outlined below and refer to this sheet in the next few weeks. These discharge instructions  provide you with general information on caring for yourself after you leave the hospital. Your doctor may also give you specific instructions. While your treatment has been planned according to the most current medical practices available, unavoidable complications occasionally occur. If you have any problems or questions after discharge, call Dr. Gala Romney at [phone]. ACTIVITY You may resume your regular activity, but move at a slower pace for the next 24 hours.  Take frequent rest periods for the next 24 hours.  Walking will help get rid of the air and reduce the bloated feeling in your belly (abdomen).  No driving for 24 hours (because of the medicine (anesthesia) used during the test).   Do not sign any important legal documents or operate any machinery for 24 hours (because of the anesthesia used during the test).  NUTRITION Drink plenty of fluids.  You may resume your normal diet as instructed by your doctor.  Begin with a light meal and progress to your normal diet. Heavy or fried foods are harder to digest and may make you feel sick to your stomach (nauseated).  Avoid alcoholic beverages for 24 hours or as instructed.  MEDICATIONS You may resume your normal medications unless your doctor tells you otherwise.  WHAT YOU CAN EXPECT TODAY Some feelings of bloating in the abdomen.  Passage of more gas than usual.  Spotting of blood in your stool or on the toilet paper.  IF YOU HAD POLYPS REMOVED DURING THE COLONOSCOPY: No aspirin products for 7 days or as instructed.  No alcohol for 7 days or as instructed.  Eat a soft diet for the next 24 hours.  FINDING OUT THE RESULTS OF YOUR TEST Not all test results are available during your visit. If your test results are not back during the visit, make an appointment with your caregiver to find out the results. Do not assume everything is normal if you have not heard from your caregiver or the medical facility. It is important for you to follow up on  all of your test results.  SEEK IMMEDIATE MEDICAL ATTENTION IF: You have more than a spotting of blood in your stool.  Your belly is swollen (abdominal distention).  You are nauseated or vomiting.  You have a temperature over 101.  You have abdominal pain or discomfort that is severe or gets worse throughout the day.     Your upper GI tract and small bowel appeared normal  However, your residual rectum, colon and small bowel was very abnormal.   You had ulcerations where anastomosis were created.   this finding goes a long way to explain your recurrent anemia.    Please continue to avoid nonsteroidal agents -   All forms.    Begin mesalamine or raise up 1 g suppository (anti-inflammatory suppository).  1 per rectum at bedtime.    My office is sending the prescription directly to your pharmacy   CBC in 2 weeks; office visit with [place] in 4 weeks.    At patient request,  I called  Tana Felts at [phone] -   reviewed findings and recommendations in detail

## 2022-12-19 NOTE — Op Note (Signed)
[facility] Patient Name: [name] Procedure Date: 12/19/2022 11:29 AM MRN: [id number] Date of Birth: [birth year] Attending MD: Norvel Richards , MD, [id number] CSN: [id number] Age: 62 Admit Type: Outpatient Procedure:                Small bowel enteroscopy Indications:              Suspected upper gastrointestinal bleeding in                            patient with unexplained iron deficiency anemia Providers:                Norvel Richards, MD, Charlsie Quest. Theda Sers RN, RN,                            Wynonia Musty Tech, Technician Referring MD:              Medicines:                Propofol per Anesthesia Complications:            No immediate complications. Estimated Blood Loss:     Estimated blood loss: none. Procedure:                Pre-Anesthesia Assessment:                           - Prior to the procedure, a History and Physical                            was performed, and patient medications and                            allergies were reviewed. The patient's tolerance of                            previous anesthesia was also reviewed. The risks                            and benefits of the procedure and the sedation                            options and risks were discussed with the patient.                            All questions were answered, and informed consent                            was obtained. Prior Anticoagulants: The patient has                            taken no anticoagulant or antiplatelet agents. ASA                            Grade Assessment: III - A patient with severe  systemic disease. After reviewing the risks and                            benefits, the patient was deemed in satisfactory                            condition to undergo the procedure.                           After obtaining informed consent, the endoscope was                            passed under direct vision. Throughout the                             procedure, the patient's blood pressure, pulse, and                            oxygen saturations were monitored continuously.The                            small bowel enteroscopy was accomplished without                            difficulty. The patient tolerated the procedure                            well. The [id number] scope was introduced                            through the mouth and advanced to the proximal                            jejunum. Scope In: 12:34:37 PM Scope Out: 12:51:10 PM Total Procedure Duration: 0 hours 16 minutes 33 seconds  Findings:      Normal-appearing esophagus. Stomach empty. Normal-appearing gastric       mucosa. Patent pylorus.      I advanced a pediatric colonoscope through the pylorus, duodenum to the       level of what I felt was proximal jejunum. Could not advance further due       to recurrent looping and acute flexure of the small bowel. From this       level the scope the scope was slowly cautious withdrawn all previous       imaging because services were again seen. The small bowel mucosa seen       today appeared normal. Impression:               - Normal-appearing esophagus and stomach.                            Normal-appearing duodenum. The examined portion of                            the jejunum was normal.                           -  No specimens collected. See sigmoidoscopy report. Moderate Sedation:      Moderate (conscious) sedation was personally administered by an       anesthesia professional. The following parameters were monitored: oxygen       saturation, heart rate, blood pressure, respiratory rate, EKG, adequacy       of pulmonary ventilation, and response to care. Recommendation:           - Patient has a contact number available for                            emergencies. The signs and symptoms of potential                            delayed complications were discussed with the                             patient. Return to normal activities tomorrow.                            Written discharge instructions were provided to the                            patient.                           - Advance diet as tolerated. See sigmoidoscopy                            report. Procedure Code(s):        --- Professional ---                           [phone], Small intestinal endoscopy, enteroscopy                            beyond second portion of duodenum, not including                            ileum; diagnostic, including collection of                            specimen(s) by brushing or washing, when performed                            (separate procedure) Diagnosis Code(s):        --- Professional ---                           D50.9, Iron deficiency anemia, unspecified CPT copyright 2022 American Medical Association. All rights reserved. The codes documented in this report are preliminary and upon coder review may  be revised to meet current compliance requirements. Cristopher Estimable. Satvik Parco, MD Norvel Richards, MD 12/19/2022 2:07:55 PM This report has been signed electronically. Number of Addenda: 0

## 2022-12-19 NOTE — Telephone Encounter (Signed)
-----   Message from Daneil Dolin, MD sent at 12/19/2022  2:04 PM EDT -----  patient has rectal ulcers.    Needs new prescription for  Rowasa 1 g suppository (generic mesalamine okay).  Dispense 30 with 3 refills.  Placed 1 in rectum every night at bedtime.

## 2022-12-20 ENCOUNTER — Encounter: Payer: Self-pay | Admitting: Hematology

## 2022-12-21 NOTE — Anesthesia Postprocedure Evaluation (Signed)
Anesthesia Post Note  Patient: [name] [facility]  Procedure(s) Performed: ESOPHAGOGASTRODUODENOSCOPY (EGD) WITH PROPOFOL ENTEROSCOPY BIOPSY FLEXIBLE SIGMOIDOSCOPY  Patient location during evaluation: Phase II Anesthesia Type: General Level of consciousness: awake Pain management: pain level controlled Vital Signs Assessment: post-procedure vital signs reviewed and stable Respiratory status: spontaneous breathing and respiratory function stable Cardiovascular status: blood pressure returned to baseline and stable Postop Assessment: no headache and no apparent nausea or vomiting Anesthetic complications: no Comments: Late entry   No notable events documented.   Last Vitals:  Vitals:   12/19/22 1320 12/19/22 1327  BP: 112/67 (!) 101/57  Pulse: 66 68  Resp:  16  Temp: 36.9 C (!) 36.3 C  SpO2: 100% 98%    Last Pain:  Vitals:   12/19/22 1327  TempSrc: Oral  PainSc: 0-No pain                 Louann Sjogren

## 2022-12-23 ENCOUNTER — Encounter: Payer: Medicare Other | Admitting: Family

## 2022-12-24 ENCOUNTER — Inpatient Hospital Stay: Payer: Medicare Other

## 2022-12-24 VITALS — BP 117/74 | HR 80 | Temp 98.5°F | Resp 18

## 2022-12-24 DIAGNOSIS — D509 Iron deficiency anemia, unspecified: Secondary | ICD-10-CM

## 2022-12-24 DIAGNOSIS — H40013 Open angle with borderline findings, low risk, bilateral: Secondary | ICD-10-CM | POA: Diagnosis not present

## 2022-12-24 LAB — SURGICAL PATHOLOGY

## 2022-12-24 MED ORDER — SODIUM CHLORIDE 0.9 % IV SOLN: Freq: Once | INTRAVENOUS | Status: AC

## 2022-12-24 MED ORDER — SODIUM CHLORIDE 0.9 % IV SOLN
510.0000 mg | Freq: Once | INTRAVENOUS | Status: AC
  Administered 2022-12-24: 510 mg via INTRAVENOUS
  Filled 2022-12-24: qty 510

## 2022-12-24 NOTE — Progress Notes (Signed)
Patient presents today for iron infusion per provider's order. Patient is in satisfactory condition with no new complaints voiced.  Vital signs are stable. We will proceed with infusion per provider orders.    Peripheral IV started with good blood pre and post infusion.  Feraheme IV iron given today per MD orders. Tolerated infusion without adverse affects. Vital signs stable. No complaints at this time. Discharged from clinic ambulatory in stable condition. Alert and oriented x 3. F/U with [facility] as scheduled.

## 2022-12-24 NOTE — Patient Instructions (Signed)
[facility]  Discharge Instructions: Thank you for choosing [facility] to provide your oncology and hematology care.  If you have a lab appointment with the [facility], please come in thru the Main Entrance and check in at the main information desk.  Wear comfortable clothing and clothing appropriate for easy access to any Portacath or PICC line.   We strive to give you quality time with your provider. You may need to reschedule your appointment if you arrive late (15 or more minutes).  Arriving late affects you and other patients whose appointments are after yours.  Also, if you miss three or more appointments without notifying the office, you may be dismissed from the clinic at the provider's discretion.      For prescription refill requests, have your pharmacy contact our office and allow 72 hours for refills to be completed.    Today you received the following Feraheme IV iron infusion.   BELOW ARE SYMPTOMS THAT SHOULD BE REPORTED IMMEDIATELY: *FEVER GREATER THAN 100.4 F (38 C) OR HIGHER *CHILLS OR SWEATING *NAUSEA AND VOMITING THAT IS NOT CONTROLLED WITH YOUR NAUSEA MEDICATION *UNUSUAL SHORTNESS OF BREATH *UNUSUAL BRUISING OR BLEEDING *URINARY PROBLEMS (pain or burning when urinating, or frequent urination) *BOWEL PROBLEMS (unusual diarrhea, constipation, pain near the anus) TENDERNESS IN MOUTH AND THROAT WITH OR WITHOUT PRESENCE OF ULCERS (sore throat, sores in mouth, or a toothache) UNUSUAL RASH, SWELLING OR PAIN  UNUSUAL VAGINAL DISCHARGE OR ITCHING   Items with * indicate a potential emergency and should be followed up as soon as possible or go to the Emergency Department if any problems should occur.  Please show the CHEMOTHERAPY ALERT CARD or IMMUNOTHERAPY ALERT CARD at check-in to the Emergency Department and triage nurse.  Should you have questions after your visit or need to cancel or reschedule your appointment, please contact  [facility] [phone]  and follow the prompts.  Office hours are 8:00 a.m. to 4:30 p.m. Monday - Friday. Please note that voicemails left after 4:00 p.m. may not be returned until the following business day.  We are closed weekends and major holidays. You have access to a nurse at all times for urgent questions. Please call the main number to the clinic [phone] and follow the prompts.  For any non-urgent questions, you may also contact your provider using MyChart. We now offer e-Visits for anyone 41 and older to request care online for non-urgent symptoms. For details visit [email or web address].   Also download the MyChart app! Go to the app store, search "MyChart", open the app, select [facility], and log in with your MyChart username and password.

## 2022-12-25 ENCOUNTER — Encounter: Payer: Self-pay | Admitting: Internal Medicine

## 2022-12-30 ENCOUNTER — Encounter (HOSPITAL_COMMUNITY): Payer: Self-pay | Admitting: Internal Medicine

## 2023-01-02 ENCOUNTER — Other Ambulatory Visit: Payer: Self-pay | Admitting: Internal Medicine

## 2023-01-06 ENCOUNTER — Ambulatory Visit: Payer: Medicare Other

## 2023-01-16 ENCOUNTER — Other Ambulatory Visit: Payer: Self-pay

## 2023-01-16 DIAGNOSIS — D509 Iron deficiency anemia, unspecified: Secondary | ICD-10-CM

## 2023-01-17 ENCOUNTER — Inpatient Hospital Stay: Payer: Medicare Other | Attending: Hematology

## 2023-01-17 DIAGNOSIS — D500 Iron deficiency anemia secondary to blood loss (chronic): Secondary | ICD-10-CM | POA: Insufficient documentation

## 2023-01-17 DIAGNOSIS — D509 Iron deficiency anemia, unspecified: Secondary | ICD-10-CM

## 2023-01-17 LAB — CBC WITH DIFFERENTIAL/PLATELET
Abs Immature Granulocytes: 0.01 10*3/uL (ref 0.00–0.07)
Basophils Absolute: 0.1 10*3/uL (ref 0.0–0.1)
Basophils Relative: 2 %
Eosinophils Absolute: 0.2 10*3/uL (ref 0.0–0.5)
Eosinophils Relative: 4 %
HCT: 33.4 % — ABNORMAL LOW (ref 39.0–52.0)
Hemoglobin: 10.1 g/dL — ABNORMAL LOW (ref 13.0–17.0)
Immature Granulocytes: 0 %
Lymphocytes Relative: 19 %
Lymphs Abs: 0.9 10*3/uL (ref 0.7–4.0)
MCH: 32.7 pg (ref 26.0–34.0)
MCHC: 30.2 g/dL (ref 30.0–36.0)
MCV: 108.1 fL — ABNORMAL HIGH (ref 80.0–100.0)
Monocytes Absolute: 0.5 10*3/uL (ref 0.1–1.0)
Monocytes Relative: 10 %
Neutro Abs: 3 10*3/uL (ref 1.7–7.7)
Neutrophils Relative %: 65 %
Platelets: 399 10*3/uL (ref 150–400)
RBC: 3.09 MIL/uL — ABNORMAL LOW (ref 4.22–5.81)
RDW: 14.1 % (ref 11.5–15.5)
WBC: 4.6 10*3/uL (ref 4.0–10.5)
nRBC: 0 % (ref 0.0–0.2)

## 2023-01-17 LAB — IRON AND TIBC
Iron: 72 ug/dL (ref 45–182)
Saturation Ratios: 16 % — ABNORMAL LOW (ref 17.9–39.5)
TIBC: 466 ug/dL — ABNORMAL HIGH (ref 250–450)
UIBC: 394 ug/dL

## 2023-01-17 LAB — FERRITIN: Ferritin: 26 ng/mL (ref 24–336)

## 2023-01-20 ENCOUNTER — Ambulatory Visit: Payer: Medicare Other | Admitting: Gastroenterology

## 2023-01-22 ENCOUNTER — Inpatient Hospital Stay: Payer: Medicare Other | Admitting: Physician Assistant

## 2023-01-23 ENCOUNTER — Encounter: Payer: Self-pay | Admitting: Family

## 2023-01-23 ENCOUNTER — Ambulatory Visit (INDEPENDENT_AMBULATORY_CARE_PROVIDER_SITE_OTHER): Payer: Medicare Other | Admitting: Family

## 2023-01-23 VITALS — BP 123/83 | HR 83 | Temp 98.1°F | Ht 70.0 in | Wt 131.0 lb

## 2023-01-23 DIAGNOSIS — E43 Unspecified severe protein-calorie malnutrition: Secondary | ICD-10-CM | POA: Diagnosis not present

## 2023-01-23 DIAGNOSIS — Z0001 Encounter for general adult medical examination with abnormal findings: Secondary | ICD-10-CM | POA: Diagnosis not present

## 2023-01-23 DIAGNOSIS — Z0000 Encounter for general adult medical examination without abnormal findings: Secondary | ICD-10-CM
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
## 2023-01-23 NOTE — Patient Instructions (Addendum)
High-Protein and High-Calorie Diet Eating high-protein and high-calorie foods can help you to gain weight, heal after an injury, and recover after an illness or surgery. The specific amount of daily protein and calories you need depends on: Your body weight. The reason this diet is recommended for you. Generally, a high-protein, high-calorie diet involves: Eating 250-500 extra calories each day. Making sure that you get enough of your daily calories from protein. Ask your health care provider how many of your calories should come from protein. Talk with a health care provider or a dietitian about how much protein and how many calories you need each day. Follow the diet as directed by your health care provider. What are tips for following this plan?  Reading food labels Check the nutrition facts label for calories, grams of fat and protein. Items with more than 4 grams of protein are high-protein foods. Preparing meals Add whole milk, half-and-half, or heavy cream to cereal, pudding, soup, or hot cocoa. Add whole milk to instant breakfast drinks. Add peanut butter to oatmeal or smoothies. Add powdered milk to baked goods, smoothies, or milkshakes. Add powdered milk, cream, or butter to mashed potatoes. Add cheese to cooked vegetables. Make whole-milk yogurt parfaits. Top them with granola, fruit, or nuts. Add cottage cheese to fruit. Add avocado, cheese, or both to sandwiches or salads. Add avocado to smoothies. Add meat, poultry, or seafood to rice, pasta, casseroles, salads, and soups. Use mayonnaise when making egg salad, chicken salad, or tuna salad. Use peanut butter as a dip for fruits and vegetables or as a topping for pretzels, celery, or crackers. Add beans to casseroles, dips, and spreads. Add pureed beans to sauces and soups. Replace calorie-free drinks with calorie-containing drinks, such as milk and fruit juice. Replace water with milk or heavy cream when making foods such as  oatmeal, pudding, or cocoa. Add oil or butter to cooked vegetables and grains. Add cream cheese to sandwiches or as a topping on crackers and bread. Make cream-based pastas and soups. General information Ask your health care provider if you should take a nutritional supplement. Try to eat six small meals each day instead of three large meals. A general goal is to eat every 2 to 3 hours. Eat a balanced diet. In each meal, include one food that is high in protein and one food with fat in it. Keep nutritious snacks available, such as nuts, trail mixes, dried fruit, and yogurt. If you have kidney disease or diabetes, talk with your health care provider about how much protein is safe for you. Too much protein may put extra stress on your kidneys. Drink your calories. Choose high-calorie drinks and have them after your meals. Consider setting a timer to remind you to eat. You will want to eat even if you do not feel very hungry. What high-protein foods should I eat?  Vegetables Soybeans. Peas. Grains Quinoa. Bulgur wheat. Buckwheat. Meats and other proteins Beef, pork, and poultry. Fish and seafood. Eggs. Tofu. Textured vegetable protein (TVP). Peanut butter. Nuts and seeds. Dried beans. Protein powders. Hummus. Dairy Whole milk. Whole-milk yogurt. Powdered milk. Cheese. Cottage Cheese. Eggnog. Beverages High-protein supplement drinks. Soy milk. Other foods Protein bars. The items listed above may not be a complete list of foods and beverages you can eat and drink. Contact a dietitian for more information. What high-calorie foods should I eat? Fruits Dried fruit. Fruit leather. Canned fruit in syrup. Fruit juice. Avocado. Vegetables Vegetables cooked in oil or butter. Fried potatoes. Grains   Pasta. Quick breads. Muffins. Pancakes. Ready-to-eat cereal. Meats and other proteins Peanut butter. Nuts and seeds. Dairy Heavy cream. Whipped cream. Cream cheese. Sour cream. Ice cream. Custard.  Pudding. Whole milk dairy products. Beverages Meal-replacement beverages. Nutrition shakes. Fruit juice. Seasonings and condiments Salad dressing. Mayonnaise. Alfredo sauce. Fruit preserves or jelly. Honey. Syrup. Sweets and desserts Cake. Cookies. Pie. Pastries. Candy bars. Chocolate. Fats and oils Butter or margarine. Oil. Gravy. Other foods Meal-replacement bars. The items listed above may not be a complete list of foods and beverages you can eat and drink. Contact a dietitian for more information. Summary A high-protein, high-calorie diet can help you gain weight or heal faster after an injury, illness, or surgery. To increase your protein and calories, add ingredients such as whole milk, peanut butter, cheese, beans, meat, or seafood to meal items. To get enough extra calories each day, include high-calorie foods and beverages at each meal. Adding a high-calorie drink or shake can be an easy way to help you get enough calories each day. Talk with your healthcare provider or dietitian about the best options for you. This information is not intended to replace advice given to you by your health care provider. Make sure you discuss any questions you have with your health care provider. Document Revised: 08/27/2020 Document Reviewed: 08/27/2020 Elsevier Patient Education  2023 [employer]  

## 2023-01-24 NOTE — Addendum Note (Signed)
Addended by: Jannifer Rodney A on: 01/24/2023 02:00 PM   Modules accepted: Level of Service
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
## 2023-01-28 ENCOUNTER — Ambulatory Visit: Payer: Medicare Other | Admitting: Gastroenterology

## 2023-01-28 ENCOUNTER — Encounter: Payer: Self-pay | Admitting: Gastroenterology

## 2023-01-28 VITALS — BP 108/68 | HR 74 | Temp 98.4°F | Ht 70.0 in | Wt 134.6 lb

## 2023-01-28 DIAGNOSIS — K625 Hemorrhage of anus and rectum: Secondary | ICD-10-CM | POA: Diagnosis not present

## 2023-01-28 DIAGNOSIS — K529 Noninfective gastroenteritis and colitis, unspecified: Secondary | ICD-10-CM

## 2023-01-28 DIAGNOSIS — D500 Iron deficiency anemia secondary to blood loss (chronic): Secondary | ICD-10-CM | POA: Diagnosis not present

## 2023-01-28 DIAGNOSIS — K90821 Short bowel syndrome with colon in continuity: Secondary | ICD-10-CM | POA: Diagnosis not present

## 2023-01-28 DIAGNOSIS — K90829 Short bowel syndrome, unspecified: Secondary | ICD-10-CM | POA: Insufficient documentation

## 2023-01-28 NOTE — Patient Instructions (Signed)
Complete lab when you go for next appointment at [facility]. I will discuss your management with Dr. Jena Gauss, further recommendations to follow. Continue current medications for now.
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
## 2023-02-06 ENCOUNTER — Inpatient Hospital Stay: Payer: Medicare Other | Attending: Hematology | Admitting: Physician Assistant

## 2023-02-06 VITALS — BP 114/89 | HR 82 | Temp 98.1°F | Resp 16 | Wt 130.5 lb

## 2023-02-06 DIAGNOSIS — R5383 Other fatigue: Secondary | ICD-10-CM | POA: Insufficient documentation

## 2023-02-06 DIAGNOSIS — G2581 Restless legs syndrome: Secondary | ICD-10-CM | POA: Diagnosis not present

## 2023-02-06 DIAGNOSIS — Z8673 Personal history of transient ischemic attack (TIA), and cerebral infarction without residual deficits: Secondary | ICD-10-CM | POA: Insufficient documentation

## 2023-02-06 DIAGNOSIS — D500 Iron deficiency anemia secondary to blood loss (chronic): Secondary | ICD-10-CM | POA: Diagnosis not present

## 2023-02-06 DIAGNOSIS — K922 Gastrointestinal hemorrhage, unspecified: Secondary | ICD-10-CM | POA: Diagnosis not present

## 2023-02-06 NOTE — Patient Instructions (Addendum)
[facility] at [facility] **VISIT SUMMARY & IMPORTANT INSTRUCTIONS **   You were seen today by Rojelio Brenner PA-C for your iron deficiency anemia.   Your blood and iron levels have improved after the IV iron you received in March 2024, but are still lower than they need to be. This is most likely due to gastrointestinal bleeding.  We will schedule you for 2 more doses of IV iron. You can stop taking your iron pill. You should continue your monthly B12 injections. You should continue to follow closely with your gastroenterologist (Dr. Jena Gauss & Tana Coast PA-C) for treatment of your GI bleeding. We will check your blood counts once a month due to high risk of recurrent GI bleeding and severe anemia.  FOLLOW-UP APPOINTMENT: We will check labs and see you for follow-up PHONE visit in 2 months.  ** Thank you for trusting me with your healthcare!  I strive to provide all of my patients with quality care at each visit.  If you receive a survey for this visit, I would be so grateful to you for taking the time to provide feedback.  Thank you in advance!  ~ Jewelle Whitner                   Dr. Doreatha Massed   &   Rojelio Brenner, PA-C   - - - - - - - - - - - - - - - - - -    Thank you for choosing [facility] at [facility] to provide your oncology and hematology care.  To afford each patient quality time with our provider, please arrive at least 15 minutes before your scheduled appointment time.   If you have a lab appointment with the Cancer Center please come in thru the Main Entrance and check in at the main information desk.  You need to re-schedule your appointment should you arrive 10 or more minutes late.  We strive to give you quality time with our providers, and arriving late affects you and other patients whose appointments are after yours.  Also, if you no show three or more times for appointments you may be dismissed from the clinic at  the providers discretion.     Again, thank you for choosing [facility].  Our hope is that these requests will decrease the amount of time that you wait before being seen by our physicians.       _____________________________________________________________  Should you have questions after your visit to [facility], please contact our office at [phone] and follow the prompts.  Our office hours are 8:00 a.m. and 4:30 p.m. Monday - Friday.  Please note that voicemails left after 4:00 p.m. may not be returned until the following business day.  We are closed weekends and major holidays.  You do have access to a nurse 24-7, just call the main number to the clinic [phone] and do not press any options, hold on the line and a nurse will answer the phone.    For prescription refill requests, have your pharmacy contact our office and allow 72 hours.

## 2023-02-10 ENCOUNTER — Other Ambulatory Visit: Payer: Self-pay

## 2023-02-10 DIAGNOSIS — D500 Iron deficiency anemia secondary to blood loss (chronic): Secondary | ICD-10-CM

## 2023-02-23 ENCOUNTER — Telehealth: Payer: Self-pay | Admitting: Gastroenterology

## 2023-02-23 NOTE — Telephone Encounter (Signed)
Please let pt know that I discussed his case with Dr. Jena Gauss.  He is advising ongoing canasa suppositories for now. Plans to follow Hgb as planned end of the month. He is suggesting we get patient back down to [facility] (Dr. Janee Morn) for follow up due to ulcerations at anastomosis. Pending evaluation, consider referral for discussion of possible small bowel transplant options.

## 2023-02-24 ENCOUNTER — Other Ambulatory Visit: Payer: Self-pay | Admitting: *Deleted

## 2023-02-24 DIAGNOSIS — K90821 Short bowel syndrome with colon in continuity: Secondary | ICD-10-CM

## 2023-02-24 NOTE — Telephone Encounter (Signed)
Referral faxed

## 2023-02-24 NOTE — Telephone Encounter (Signed)
Pt was made aware and verbalized understanding. Pt is agreeable with referral back to [facility] to Dr. Janee Morn.

## 2023-02-25 ENCOUNTER — Inpatient Hospital Stay: Payer: Medicare Other

## 2023-02-25 VITALS — BP 105/54 | HR 75 | Temp 98.6°F | Resp 18 | Ht 70.0 in | Wt 134.0 lb

## 2023-02-25 DIAGNOSIS — K922 Gastrointestinal hemorrhage, unspecified: Secondary | ICD-10-CM | POA: Diagnosis not present

## 2023-02-25 DIAGNOSIS — R5383 Other fatigue: Secondary | ICD-10-CM | POA: Diagnosis not present

## 2023-02-25 DIAGNOSIS — Z8673 Personal history of transient ischemic attack (TIA), and cerebral infarction without residual deficits: Secondary | ICD-10-CM | POA: Diagnosis not present

## 2023-02-25 DIAGNOSIS — D500 Iron deficiency anemia secondary to blood loss (chronic): Secondary | ICD-10-CM

## 2023-02-25 DIAGNOSIS — G2581 Restless legs syndrome: Secondary | ICD-10-CM | POA: Diagnosis not present

## 2023-02-25 MED ORDER — SODIUM CHLORIDE 0.9 % IV SOLN
510.0000 mg | Freq: Once | INTRAVENOUS | Status: AC
  Administered 2023-02-25: 510 mg via INTRAVENOUS
  Filled 2023-02-25: qty 510

## 2023-02-25 MED ORDER — SODIUM CHLORIDE 0.9 % IV SOLN: Freq: Once | INTRAVENOUS | Status: AC

## 2023-02-25 MED ORDER — ACETAMINOPHEN 325 MG PO TABS: 650.0000 mg | ORAL_TABLET | Freq: Once | ORAL | Status: DC

## 2023-02-25 MED ORDER — CETIRIZINE HCL 10 MG PO TABS: 10.0000 mg | ORAL_TABLET | Freq: Once | ORAL | Status: DC

## 2023-02-25 NOTE — Patient Instructions (Signed)
[facility]  Discharge Instructions: Thank you for choosing [facility] to provide your oncology and hematology care.  If you have a lab appointment with the Cancer Center - please note that after April 8th, 2024, all labs will be drawn in the cancer center.  You do not have to check in or register with the main entrance as you have in the past but will complete your check-in in the cancer center.  Wear comfortable clothing and clothing appropriate for easy access to any Portacath or PICC line.   We strive to give you quality time with your provider. You may need to reschedule your appointment if you arrive late (15 or more minutes).  Arriving late affects you and other patients whose appointments are after yours.  Also, if you miss three or more appointments without notifying the office, you may be dismissed from the clinic at the provider's discretion.      For prescription refill requests, have your pharmacy contact our office and allow 72 hours for refills to be completed.    Today you received Feraheme IV iron infusion.   .  BELOW ARE SYMPTOMS THAT SHOULD BE REPORTED IMMEDIATELY: *FEVER GREATER THAN 100.4 F (38 C) OR HIGHER *CHILLS OR SWEATING *NAUSEA AND VOMITING THAT IS NOT CONTROLLED WITH YOUR NAUSEA MEDICATION *UNUSUAL SHORTNESS OF BREATH *UNUSUAL BRUISING OR BLEEDING *URINARY PROBLEMS (pain or burning when urinating, or frequent urination) *BOWEL PROBLEMS (unusual diarrhea, constipation, pain near the anus) TENDERNESS IN MOUTH AND THROAT WITH OR WITHOUT PRESENCE OF ULCERS (sore throat, sores in mouth, or a toothache) UNUSUAL RASH, SWELLING OR PAIN  UNUSUAL VAGINAL DISCHARGE OR ITCHING   Items with * indicate a potential emergency and should be followed up as soon as possible or go to the Emergency Department if any problems should occur.  Please show the CHEMOTHERAPY ALERT CARD or IMMUNOTHERAPY ALERT CARD at check-in to the Emergency  Department and triage nurse.  Should you have questions after your visit or need to cancel or reschedule your appointment, please contact [facility] [phone]  and follow the prompts.  Office hours are 8:00 a.m. to 4:30 p.m. Monday - Friday. Please note that voicemails left after 4:00 p.m. may not be returned until the following business day.  We are closed weekends and major holidays. You have access to a nurse at all times for urgent questions. Please call the main number to the clinic [phone] and follow the prompts.  For any non-urgent questions, you may also contact your provider using MyChart. We now offer e-Visits for anyone 18 and older to request care online for non-urgent symptoms. For details visit [email or web address].   Also download the MyChart app! Go to the app store, search "MyChart", open the app, select [place], and log in with your MyChart username and password.   

## 2023-02-25 NOTE — Progress Notes (Signed)
Patient presents today for iron infusion.  Patient is in satisfactory condition with no new complaints voiced.  Vital signs are stable.  We will proceed with infusion per provider orders.    Peripheral IV started with good blood return pre and post infusion.  Feraheme 510 mg  given today per MD orders. Tolerated infusion without adverse affects. Vital signs stable. No complaints at this time. Discharged from clinic ambulatory in stable condition. Alert and oriented x 3. F/U with [facility] as scheduled.   

## 2023-02-27 NOTE — Telephone Encounter (Signed)
Spoke to Dr. Jena Gauss, he also wants patient to go down to [facility] for management of short gut syndrome.

## 2023-02-27 NOTE — Telephone Encounter (Signed)
Pt was made aware through mychart.

## 2023-02-28 NOTE — Telephone Encounter (Signed)
Referral to [facility] faxed

## 2023-03-01 ENCOUNTER — Other Ambulatory Visit: Payer: Self-pay | Admitting: Family

## 2023-03-06 ENCOUNTER — Inpatient Hospital Stay: Payer: Medicare Other

## 2023-03-06 VITALS — BP 104/67 | HR 81 | Temp 97.8°F | Resp 18

## 2023-03-06 DIAGNOSIS — G2581 Restless legs syndrome: Secondary | ICD-10-CM | POA: Diagnosis not present

## 2023-03-06 DIAGNOSIS — K922 Gastrointestinal hemorrhage, unspecified: Secondary | ICD-10-CM | POA: Diagnosis not present

## 2023-03-06 DIAGNOSIS — D500 Iron deficiency anemia secondary to blood loss (chronic): Secondary | ICD-10-CM | POA: Diagnosis not present

## 2023-03-06 DIAGNOSIS — R5383 Other fatigue: Secondary | ICD-10-CM | POA: Diagnosis not present

## 2023-03-06 DIAGNOSIS — Z8673 Personal history of transient ischemic attack (TIA), and cerebral infarction without residual deficits: Secondary | ICD-10-CM | POA: Diagnosis not present

## 2023-03-06 MED ORDER — SODIUM CHLORIDE 0.9 % IV SOLN
510.0000 mg | Freq: Once | INTRAVENOUS | Status: AC
  Administered 2023-03-06: 510 mg via INTRAVENOUS
  Filled 2023-03-06: qty 510

## 2023-03-06 MED ORDER — SODIUM CHLORIDE 0.9 % IV SOLN: Freq: Once | INTRAVENOUS | Status: AC

## 2023-03-06 NOTE — Progress Notes (Signed)
Feraheme given today per MD orders. Tolerated infusion without adverse affects. Vital signs stable. No complaints at this time. Discharged from clinic ambulatory in stable condition. Alert and oriented x 3. F/U with [facility] as scheduled.  

## 2023-03-06 NOTE — Patient Instructions (Signed)
[facility]  Discharge Instructions: Thank you for choosing [facility] to provide your oncology and hematology care.  If you have a lab appointment with the Cancer Center - please note that after April 8th, 2024, all labs will be drawn in the cancer center.  You do not have to check in or register with the main entrance as you have in the past but will complete your check-in in the cancer center.  Wear comfortable clothing and clothing appropriate for easy access to any Portacath or PICC line.   We strive to give you quality time with your provider. You may need to reschedule your appointment if you arrive late (15 or more minutes).  Arriving late affects you and other patients whose appointments are after yours.  Also, if you miss three or more appointments without notifying the office, you may be dismissed from the clinic at the provider's discretion.      For prescription refill requests, have your pharmacy contact our office and allow 72 hours for refills to be completed.    Today you received the following chemotherapy and/or immunotherapy agents Feraheme. Ferumoxytol Injection What is this medication? FERUMOXYTOL (FER ue MOX i tol) treats low levels of iron in your body (iron deficiency anemia). Iron is a mineral that plays an important role in making red blood cells, which carry oxygen from your lungs to the rest of your body. This medicine may be used for other purposes; ask your health care provider or pharmacist if you have questions. COMMON BRAND NAME(S): Feraheme What should I tell my care team before I take this medication? They need to know if you have any of these conditions: Anemia not caused by low iron levels High levels of iron in the blood Magnetic resonance imaging (MRI) test scheduled An unusual or allergic reaction to iron, other medications, foods, dyes, or preservatives Pregnant or trying to get pregnant Breastfeeding How should I  use this medication? This medication is injected into a vein. It is given by your care team in a hospital or clinic setting. Talk to your care team the use of this medication in children. Special care may be needed. Overdosage: If you think you have taken too much of this medicine contact a poison control center or emergency room at once. NOTE: This medicine is only for you. Do not share this medicine with others. What if I miss a dose? It is important not to miss your dose. Call your care team if you are unable to keep an appointment. What may interact with this medication? Other iron products This list may not describe all possible interactions. Give your health care provider a list of all the medicines, herbs, non-prescription drugs, or dietary supplements you use. Also tell them if you smoke, drink alcohol, or use illegal drugs. Some items may interact with your medicine. What should I watch for while using this medication? Visit your care team regularly. Tell your care team if your symptoms do not start to get better or if they get worse. You may need blood work done while you are taking this medication. You may need to follow a special diet. Talk to your care team. Foods that contain iron include: whole grains/cereals, dried fruits, beans, or peas, leafy green vegetables, and organ meats (liver, kidney). What side effects may I notice from receiving this medication? Side effects that you should report to your care team as soon as possible: Allergic reactions--skin rash, itching, hives, swelling  of the face, lips, tongue, or throat Low blood pressure--dizziness, feeling faint or lightheaded, blurry vision Shortness of breath Side effects that usually do not require medical attention (report to your care team if they continue or are bothersome): Flushing Headache Joint pain Muscle pain Nausea Pain, redness, or irritation at injection site This list may not describe all possible side  effects. Call your doctor for medical advice about side effects. You may report side effects to FDA at 1-800-FDA-1088. Where should I keep my medication? This medication is given in a hospital or clinic and will not be stored at home. NOTE: This sheet is a summary. It may not cover all possible information. If you have questions about this medicine, talk to your doctor, pharmacist, or health care provider.  2024 Elsevier/Gold Standard (2022-04-01 00:00:00)       To help prevent nausea and vomiting after your treatment, we encourage you to take your nausea medication as directed.  BELOW ARE SYMPTOMS THAT SHOULD BE REPORTED IMMEDIATELY: *FEVER GREATER THAN 100.4 F (38 C) OR HIGHER *CHILLS OR SWEATING *NAUSEA AND VOMITING THAT IS NOT CONTROLLED WITH YOUR NAUSEA MEDICATION *UNUSUAL SHORTNESS OF BREATH *UNUSUAL BRUISING OR BLEEDING *URINARY PROBLEMS (pain or burning when urinating, or frequent urination) *BOWEL PROBLEMS (unusual diarrhea, constipation, pain near the anus) TENDERNESS IN MOUTH AND THROAT WITH OR WITHOUT PRESENCE OF ULCERS (sore throat, sores in mouth, or a toothache) UNUSUAL RASH, SWELLING OR PAIN  UNUSUAL VAGINAL DISCHARGE OR ITCHING   Items with * indicate a potential emergency and should be followed up as soon as possible or go to the Emergency Department if any problems should occur.  Please show the CHEMOTHERAPY ALERT CARD or IMMUNOTHERAPY ALERT CARD at check-in to the Emergency Department and triage nurse.  Should you have questions after your visit or need to cancel or reschedule your appointment, please contact [facility] [phone]  and follow the prompts.  Office hours are 8:00 a.m. to 4:30 p.m. Monday - Friday. Please note that voicemails left after 4:00 p.m. may not be returned until the following business day.  We are closed weekends and major holidays. You have access to a nurse at all times for urgent questions. Please call the main number  to the clinic [phone] and follow the prompts.  For any non-urgent questions, you may also contact your provider using MyChart. We now offer e-Visits for anyone 25 and older to request care online for non-urgent symptoms. For details visit [email or web address].   Also download the MyChart app! Go to the app store, search "MyChart", open the app, select [facility], and log in with your MyChart username and password.

## 2023-03-13 ENCOUNTER — Inpatient Hospital Stay: Payer: Medicare Other

## 2023-03-19 ENCOUNTER — Other Ambulatory Visit: Payer: Self-pay | Admitting: *Deleted

## 2023-03-19 DIAGNOSIS — K90821 Short bowel syndrome with colon in continuity: Secondary | ICD-10-CM

## 2023-04-02 ENCOUNTER — Encounter: Payer: Self-pay | Admitting: Nurse Practitioner

## 2023-04-02 ENCOUNTER — Ambulatory Visit (INDEPENDENT_AMBULATORY_CARE_PROVIDER_SITE_OTHER): Payer: Medicare Other | Admitting: Nurse Practitioner

## 2023-04-02 VITALS — BP 111/67 | HR 77 | Temp 98.5°F | Ht 70.0 in | Wt 133.2 lb

## 2023-04-02 DIAGNOSIS — M545 Low back pain, unspecified: Secondary | ICD-10-CM | POA: Diagnosis not present

## 2023-04-02 MED ORDER — METHYLPREDNISOLONE ACETATE 40 MG/ML IJ SUSP
40.0000 mg | Freq: Once | INTRAMUSCULAR | Status: AC
Start: 2023-04-02 — End: 2023-04-02
  Administered 2023-04-02: 40 mg via INTRAMUSCULAR

## 2023-04-02 MED ORDER — CYCLOBENZAPRINE HCL 5 MG PO TABS
5.0000 mg | ORAL_TABLET | Freq: Three times a day (TID) | ORAL | 0 refills | Status: DC | PRN
Start: 2023-04-02 — End: 2023-11-14

## 2023-04-02 NOTE — Progress Notes (Signed)
   Acute Office Visit  Subjective:     Patient ID: [name], male    DOB: 1961-03-31, 62 y.o.   MRN: [id number]  Chief Complaint  Patient presents with   Back Pain    Started hurting Sunday night. Not sure what caused the pain to start. Taking tylenol not helping.     HPI SUBJECTIVE:  [name] is a 62 y.o. male who complains of an injury causing low back pain 4 day(s) ago. The pain is positional with bending or lifting, without radiation down the legs. Mechanism of injury: bending lifting chairs an furniture. Symptoms have been acute and sharp pain in lower back 8/10 since that time. Prior history of back problems: recurrent self limited episodes of low back pain in the past. There is no numbness in the legs. Have tried heat pad and ibuprofen with minimal relieve.     ROS Negative unless indicated in HPI    Objective:    BP 111/67   Pulse 77   Temp 98.5 F (36.9 C) (Temporal)   Ht 5\' 10"  (1.778 m)   Wt 133 lb 3.2 oz (60.4 kg)   SpO2 100%   BMI 19.11 kg/m  BP Readings from Last 3 Encounters:  04/02/23 111/67  03/06/23 104/67  02/25/23 (!) 105/54   Wt Readings from Last 3 Encounters:  04/02/23 133 lb 3.2 oz (60.4 kg)  02/25/23 134 lb (60.8 kg)  02/06/23 130 lb 8.2 oz (59.2 kg)      Physical Exam  Vital signs as noted above. Patient appears to be in mild to moderate pain, antalgic gait noted. Lumbosacral spine area reveals no local tenderness or mass. Painful and reduced LS ROM noted. Straight leg raise is positive at 40 degrees on bilateral. DTR's, motor strength and sensation normal, including heel and toe gait.  Peripheral pulses are palpable. Lumbar spine X-Ray: not indicated.  No results found for any visits on 04/02/23.      Assessment & Plan:  Acute midline low back pain without sciatica -     methylPREDNISolone Acetate -     Cyclobenzaprine HCl; Take 1 tablet (5 mg total) by mouth 3 (three) times daily as needed for muscle spasms.  Dispense: 30  tablet; Refill: 0   ASSESSMENT:  lumbar strain  PLAN: DepoMedrol 40 mg IM  Flexeril 5 mg 1-tab TID PRN For acute pain, rest, intermittent application of cold packs (later, may switch to heat, but do not sleep on heating pad), analgesics and muscle relaxants are recommended.  Discussed longer term treatment plan of prn NSAID's and discussed a home back care exercise program with flexion exercise routine.  Proper lifting with avoidance of heavy lifting discussed.  Consider Physical Therapy and XRay studies if not improving. Call or return to clinic prn if these symptoms worsen or fail to improve as anticipated.  Return if symptoms worsen or fail to improve.  Arrie Aran Santa Lighter, DNP [facility] [address] [place], [place] [address] [phone]

## 2023-04-16 ENCOUNTER — Other Ambulatory Visit: Payer: Medicare Other

## 2023-04-17 ENCOUNTER — Inpatient Hospital Stay: Payer: Medicare Other | Attending: Hematology

## 2023-04-17 DIAGNOSIS — R197 Diarrhea, unspecified: Secondary | ICD-10-CM | POA: Diagnosis not present

## 2023-04-17 DIAGNOSIS — K922 Gastrointestinal hemorrhage, unspecified: Secondary | ICD-10-CM | POA: Insufficient documentation

## 2023-04-17 DIAGNOSIS — D500 Iron deficiency anemia secondary to blood loss (chronic): Secondary | ICD-10-CM | POA: Diagnosis not present

## 2023-04-17 DIAGNOSIS — R5383 Other fatigue: Secondary | ICD-10-CM | POA: Diagnosis not present

## 2023-04-17 DIAGNOSIS — Z77090 Contact with and (suspected) exposure to asbestos: Secondary | ICD-10-CM | POA: Diagnosis not present

## 2023-04-17 LAB — CBC WITH DIFFERENTIAL/PLATELET
Abs Immature Granulocytes: 0.01 10*3/uL (ref 0.00–0.07)
Basophils Absolute: 0 10*3/uL (ref 0.0–0.1)
Basophils Relative: 1 %
Eosinophils Absolute: 0.3 10*3/uL (ref 0.0–0.5)
Eosinophils Relative: 6 %
HCT: 30.3 % — ABNORMAL LOW (ref 39.0–52.0)
Hemoglobin: 9.1 g/dL — ABNORMAL LOW (ref 13.0–17.0)
Immature Granulocytes: 0 %
Lymphocytes Relative: 15 %
Lymphs Abs: 0.7 10*3/uL (ref 0.7–4.0)
MCH: 27.7 pg (ref 26.0–34.0)
MCHC: 30 g/dL (ref 30.0–36.0)
MCV: 92.4 fL (ref 80.0–100.0)
Monocytes Absolute: 0.5 10*3/uL (ref 0.1–1.0)
Monocytes Relative: 11 %
Neutro Abs: 3.2 10*3/uL (ref 1.7–7.7)
Neutrophils Relative %: 67 %
Platelets: 566 10*3/uL — ABNORMAL HIGH (ref 150–400)
RBC: 3.28 MIL/uL — ABNORMAL LOW (ref 4.22–5.81)
RDW: 18.4 % — ABNORMAL HIGH (ref 11.5–15.5)
WBC: 4.8 10*3/uL (ref 4.0–10.5)
nRBC: 0 % (ref 0.0–0.2)

## 2023-04-17 LAB — FERRITIN: Ferritin: 10 ng/mL — ABNORMAL LOW (ref 24–336)

## 2023-04-17 LAB — RETICULOCYTES
Immature Retic Fract: 18.9 % — ABNORMAL HIGH (ref 2.3–15.9)
RBC.: 3.31 MIL/uL — ABNORMAL LOW (ref 4.22–5.81)
Retic Count, Absolute: 84.7 10*3/uL (ref 19.0–186.0)
Retic Ct Pct: 2.6 % (ref 0.4–3.1)

## 2023-04-17 LAB — IRON AND TIBC
Iron: 75 ug/dL (ref 45–182)
Saturation Ratios: 13 % — ABNORMAL LOW (ref 17.9–39.5)
TIBC: 600 ug/dL — ABNORMAL HIGH (ref 250–450)
UIBC: 525 ug/dL

## 2023-04-18 DIAGNOSIS — K912 Postsurgical malabsorption, not elsewhere classified: Secondary | ICD-10-CM | POA: Diagnosis not present

## 2023-04-18 DIAGNOSIS — Z9049 Acquired absence of other specified parts of digestive tract: Secondary | ICD-10-CM | POA: Diagnosis not present
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
## 2023-04-23 ENCOUNTER — Encounter: Payer: Self-pay | Admitting: Physician Assistant

## 2023-04-23 ENCOUNTER — Inpatient Hospital Stay (HOSPITAL_BASED_OUTPATIENT_CLINIC_OR_DEPARTMENT_OTHER): Payer: Medicare Other | Admitting: Physician Assistant

## 2023-04-23 ENCOUNTER — Other Ambulatory Visit: Payer: Self-pay

## 2023-04-23 DIAGNOSIS — D500 Iron deficiency anemia secondary to blood loss (chronic): Secondary | ICD-10-CM

## 2023-04-23 DIAGNOSIS — K922 Gastrointestinal hemorrhage, unspecified: Secondary | ICD-10-CM

## 2023-04-24 ENCOUNTER — Inpatient Hospital Stay: Payer: Medicare Other

## 2023-04-24 VITALS — BP 104/75 | HR 71 | Temp 96.9°F | Resp 18

## 2023-04-24 DIAGNOSIS — R5383 Other fatigue: Secondary | ICD-10-CM | POA: Diagnosis not present

## 2023-04-24 DIAGNOSIS — D500 Iron deficiency anemia secondary to blood loss (chronic): Secondary | ICD-10-CM | POA: Diagnosis not present

## 2023-04-24 DIAGNOSIS — K922 Gastrointestinal hemorrhage, unspecified: Secondary | ICD-10-CM | POA: Diagnosis not present

## 2023-04-24 DIAGNOSIS — Z77090 Contact with and (suspected) exposure to asbestos: Secondary | ICD-10-CM | POA: Diagnosis not present

## 2023-04-24 DIAGNOSIS — R197 Diarrhea, unspecified: Secondary | ICD-10-CM | POA: Diagnosis not present

## 2023-04-24 MED ORDER — SODIUM CHLORIDE 0.9 % IV SOLN: Freq: Once | INTRAVENOUS | Status: AC

## 2023-04-24 MED ORDER — SODIUM CHLORIDE 0.9 % IV SOLN
510.0000 mg | Freq: Once | INTRAVENOUS | Status: AC
  Administered 2023-04-24: 510 mg via INTRAVENOUS
  Filled 2023-04-24: qty 510

## 2023-04-24 MED ORDER — ACETAMINOPHEN 325 MG PO TABS
650.0000 mg | ORAL_TABLET | Freq: Once | ORAL | Status: AC
  Administered 2023-04-24: 650 mg via ORAL
  Filled 2023-04-24: qty 2

## 2023-04-24 MED ORDER — CETIRIZINE HCL 10 MG PO TABS
10.0000 mg | ORAL_TABLET | Freq: Once | ORAL | Status: AC
  Administered 2023-04-24: 10 mg via ORAL
  Filled 2023-04-24: qty 1

## 2023-04-24 NOTE — Progress Notes (Signed)
Patient presents today for Feraheme infusion per providers order.  Vital signs within parameters for treatment.  Patient has no new complaints at this time.  Peripheral IV started and blood return noted pre and post infusion.    Stable during infusion without adverse affects.  Vital signs stable.  No complaints at this time.  Discharge from clinic ambulatory in stable condition.  Alert and oriented X 3.  Follow up with [facility] as scheduled.  

## 2023-04-24 NOTE — Patient Instructions (Signed)
[facility] [place]  Discharge Instructions: Thank you for choosing [facility] to provide your oncology and hematology care.  If you have a lab appointment with the Cancer Center - please note that after April 8th, 2024, all labs will be drawn in the cancer center.  You do not have to check in or register with the main entrance as you have in the past but will complete your check-in in the cancer center.  Wear comfortable clothing and clothing appropriate for easy access to any Portacath or PICC line.   We strive to give you quality time with your provider. You may need to reschedule your appointment if you arrive late (15 or more minutes).  Arriving late affects you and other patients whose appointments are after yours.  Also, if you miss three or more appointments without notifying the office, you may be dismissed from the clinic at the provider's discretion.      For prescription refill requests, have your pharmacy contact our office and allow 72 hours for refills to be completed.    Today you received the following chemotherapy and/or immunotherapy agents Feraheme      To help prevent nausea and vomiting after your treatment, we encourage you to take your nausea medication as directed.  BELOW ARE SYMPTOMS THAT SHOULD BE REPORTED IMMEDIATELY: *FEVER GREATER THAN 100.4 F (38 C) OR HIGHER *CHILLS OR SWEATING *NAUSEA AND VOMITING THAT IS NOT CONTROLLED WITH YOUR NAUSEA MEDICATION *UNUSUAL SHORTNESS OF BREATH *UNUSUAL BRUISING OR BLEEDING *URINARY PROBLEMS (pain or burning when urinating, or frequent urination) *BOWEL PROBLEMS (unusual diarrhea, constipation, pain near the anus) TENDERNESS IN MOUTH AND THROAT WITH OR WITHOUT PRESENCE OF ULCERS (sore throat, sores in mouth, or a toothache) UNUSUAL RASH, SWELLING OR PAIN  UNUSUAL VAGINAL DISCHARGE OR ITCHING   Items with * indicate a potential emergency and should be followed up as soon as possible or go to the  Emergency Department if any problems should occur.  Please show the CHEMOTHERAPY ALERT CARD or IMMUNOTHERAPY ALERT CARD at check-in to the Emergency Department and triage nurse.  Should you have questions after your visit or need to cancel or reschedule your appointment, please contact [facility] [place] [phone]  and follow the prompts.  Office hours are 8:00 a.m. to 4:30 p.m. Monday - Friday. Please note that voicemails left after 4:00 p.m. may not be returned until the following business day.  We are closed weekends and major holidays. You have access to a nurse at all times for urgent questions. Please call the main number to the clinic [phone] and follow the prompts.  For any non-urgent questions, you may also contact your provider using MyChart. We now offer e-Visits for anyone 18 and older to request care online for non-urgent symptoms. For details visit [email or web address].   Also download the MyChart app! Go to the app store, search "MyChart", open the app, select [facility], and log in with your MyChart username and password.   

## 2023-05-06 ENCOUNTER — Inpatient Hospital Stay: Payer: Medicare Other

## 2023-05-06 VITALS — BP 119/82 | HR 82 | Temp 96.9°F | Resp 18

## 2023-05-06 DIAGNOSIS — D500 Iron deficiency anemia secondary to blood loss (chronic): Secondary | ICD-10-CM | POA: Diagnosis not present

## 2023-05-06 DIAGNOSIS — Z77090 Contact with and (suspected) exposure to asbestos: Secondary | ICD-10-CM | POA: Diagnosis not present

## 2023-05-06 DIAGNOSIS — R5383 Other fatigue: Secondary | ICD-10-CM | POA: Diagnosis not present

## 2023-05-06 DIAGNOSIS — K922 Gastrointestinal hemorrhage, unspecified: Secondary | ICD-10-CM | POA: Diagnosis not present

## 2023-05-06 DIAGNOSIS — R197 Diarrhea, unspecified: Secondary | ICD-10-CM | POA: Diagnosis not present

## 2023-05-06 MED ORDER — SODIUM CHLORIDE 0.9 % IV SOLN
510.0000 mg | Freq: Once | INTRAVENOUS | Status: AC
  Administered 2023-05-06: 510 mg via INTRAVENOUS
  Filled 2023-05-06: qty 17

## 2023-05-06 MED ORDER — CETIRIZINE HCL 10 MG PO TABS
10.0000 mg | ORAL_TABLET | Freq: Once | ORAL | Status: AC
  Administered 2023-05-06: 10 mg via ORAL
  Filled 2023-05-06: qty 1

## 2023-05-06 MED ORDER — ACETAMINOPHEN 325 MG PO TABS
650.0000 mg | ORAL_TABLET | Freq: Once | ORAL | Status: AC
  Administered 2023-05-06: 650 mg via ORAL
  Filled 2023-05-06: qty 2

## 2023-05-06 MED ORDER — SODIUM CHLORIDE 0.9 % IV SOLN: Freq: Once | INTRAVENOUS | Status: AC

## 2023-05-06 NOTE — Patient Instructions (Signed)

## 2023-05-06 NOTE — Progress Notes (Signed)
Patient tolerated iron infusion with no complaints voiced.  Peripheral IV site clean and dry with good blood return noted before and after infusion.  Band aid applied.  VSS with discharge and left in satisfactory condition with no s/s of distress noted.   

## 2023-05-09 ENCOUNTER — Other Ambulatory Visit: Payer: Self-pay | Admitting: Internal Medicine

## 2023-05-19 ENCOUNTER — Other Ambulatory Visit: Payer: Self-pay | Admitting: Family

## 2023-05-20 ENCOUNTER — Inpatient Hospital Stay: Payer: Medicare Other | Attending: Hematology

## 2023-05-20 ENCOUNTER — Inpatient Hospital Stay: Payer: Medicare Other

## 2023-05-20 VITALS — BP 113/81 | HR 71 | Temp 97.6°F | Resp 18

## 2023-05-20 DIAGNOSIS — K922 Gastrointestinal hemorrhage, unspecified: Secondary | ICD-10-CM | POA: Diagnosis not present

## 2023-05-20 DIAGNOSIS — D500 Iron deficiency anemia secondary to blood loss (chronic): Secondary | ICD-10-CM | POA: Insufficient documentation

## 2023-05-20 LAB — CBC
HCT: 31.6 % — ABNORMAL LOW (ref 39.0–52.0)
Hemoglobin: 9.5 g/dL — ABNORMAL LOW (ref 13.0–17.0)
MCH: 29.4 pg (ref 26.0–34.0)
MCHC: 30.1 g/dL (ref 30.0–36.0)
MCV: 97.8 fL (ref 80.0–100.0)
Platelets: 449 10*3/uL — ABNORMAL HIGH (ref 150–400)
RBC: 3.23 MIL/uL — ABNORMAL LOW (ref 4.22–5.81)
RDW: 20.8 % — ABNORMAL HIGH (ref 11.5–15.5)
WBC: 7.1 10*3/uL (ref 4.0–10.5)
nRBC: 0 % (ref 0.0–0.2)

## 2023-05-20 LAB — SAMPLE TO BLOOD BANK

## 2023-05-20 MED ORDER — SODIUM CHLORIDE 0.9 % IV SOLN: Freq: Once | INTRAVENOUS | Status: AC

## 2023-05-20 MED ORDER — CETIRIZINE HCL 10 MG PO TABS
10.0000 mg | ORAL_TABLET | Freq: Once | ORAL | Status: AC
  Administered 2023-05-20: 10 mg via ORAL
  Filled 2023-05-20: qty 1

## 2023-05-20 MED ORDER — ACETAMINOPHEN 325 MG PO TABS
650.0000 mg | ORAL_TABLET | Freq: Once | ORAL | Status: AC
  Administered 2023-05-20: 650 mg via ORAL
  Filled 2023-05-20: qty 2

## 2023-05-20 MED ORDER — SODIUM CHLORIDE 0.9 % IV SOLN
510.0000 mg | Freq: Once | INTRAVENOUS | Status: AC
  Administered 2023-05-20: 510 mg via INTRAVENOUS
  Filled 2023-05-20: qty 510

## 2023-05-20 NOTE — Progress Notes (Signed)
Patient tolerated iron infusion with no complaints voiced.  Peripheral IV site clean and dry with good blood return noted before and after infusion.  Band aid applied.  VSS with discharge and left in satisfactory condition with no s/s of distress noted.   

## 2023-05-20 NOTE — Patient Instructions (Signed)

## 2023-06-03 ENCOUNTER — Inpatient Hospital Stay: Payer: Medicare Other

## 2023-06-03 VITALS — BP 115/77 | HR 78 | Temp 96.7°F | Resp 18

## 2023-06-03 DIAGNOSIS — D500 Iron deficiency anemia secondary to blood loss (chronic): Secondary | ICD-10-CM | POA: Diagnosis not present

## 2023-06-03 DIAGNOSIS — K922 Gastrointestinal hemorrhage, unspecified: Secondary | ICD-10-CM | POA: Diagnosis not present

## 2023-06-03 MED ORDER — SODIUM CHLORIDE 0.9 % IV SOLN
510.0000 mg | Freq: Once | INTRAVENOUS | Status: AC
  Administered 2023-06-03: 510 mg via INTRAVENOUS
  Filled 2023-06-03: qty 17

## 2023-06-03 MED ORDER — ACETAMINOPHEN 325 MG PO TABS
650.0000 mg | ORAL_TABLET | Freq: Once | ORAL | Status: AC
  Administered 2023-06-03: 650 mg via ORAL
  Filled 2023-06-03: qty 2

## 2023-06-03 MED ORDER — SODIUM CHLORIDE 0.9 % IV SOLN: Freq: Once | INTRAVENOUS | Status: AC

## 2023-06-03 MED ORDER — CETIRIZINE HCL 10 MG PO TABS
10.0000 mg | ORAL_TABLET | Freq: Once | ORAL | Status: AC
  Administered 2023-06-03: 10 mg via ORAL
  Filled 2023-06-03: qty 1

## 2023-06-03 NOTE — Patient Instructions (Signed)

## 2023-06-03 NOTE — Progress Notes (Signed)
Patient tolerated iron infusion with no complaints voiced.  Peripheral IV site clean and dry with good blood return noted before and after infusion.  Band aid applied.  VSS with discharge and left in satisfactory condition with no s/s of distress noted.   

## 2023-06-12 ENCOUNTER — Ambulatory Visit (INDEPENDENT_AMBULATORY_CARE_PROVIDER_SITE_OTHER): Payer: Medicare Other | Admitting: Nurse Practitioner

## 2023-06-12 ENCOUNTER — Encounter: Payer: Self-pay | Admitting: Nurse Practitioner

## 2023-06-12 VITALS — BP 124/78 | HR 95 | Temp 99.0°F | Resp 20 | Ht 70.0 in | Wt 134.0 lb

## 2023-06-12 DIAGNOSIS — J069 Acute upper respiratory infection, unspecified: Secondary | ICD-10-CM | POA: Diagnosis not present

## 2023-06-12 DIAGNOSIS — R0981 Nasal congestion: Secondary | ICD-10-CM | POA: Diagnosis not present

## 2023-06-12 LAB — VERITOR FLU A/B WAIVED
Influenza A: NEGATIVE
Influenza B: NEGATIVE

## 2023-06-12 MED ORDER — PROMETHAZINE-DM 6.25-15 MG/5ML PO SYRP: 5.0000 mL | ORAL_SOLUTION | Freq: Four times a day (QID) | ORAL | 0 refills | Status: DC | PRN

## 2023-06-12 MED ORDER — AMOXICILLIN-POT CLAVULANATE 875-125 MG PO TABS: 1.0000 | ORAL_TABLET | Freq: Two times a day (BID) | ORAL | 0 refills | Status: DC

## 2023-06-12 NOTE — Patient Instructions (Signed)
1. Take meds as prescribed ?2. Use a cool mist humidifier especially during the winter months and when heat has been humid. ?3. Use saline nose sprays frequently ?4. Saline irrigations of the nose can be very helpful if done frequently. ? * 4X daily for 1 week* ? * Use of a nettie pot can be helpful with this. Follow directions with this* ?5. Drink plenty of fluids ?6. Keep thermostat turn down low ?7.For any cough or congestion- promethazine DM ?8. For fever or aces or pains- take tylenol or ibuprofen appropriate for age and weight. ? * for fevers greater than 101 orally you may alternate ibuprofen and tylenol every  3 hours. ?  ? ?

## 2023-06-12 NOTE — Progress Notes (Signed)
Subjective:    Patient ID: [name], male    DOB: 1960/10/20, 62 y.o.   MRN: [id number]   Chief Complaint: Cough and Sinus Problem (Negative covid test yesterday/)   Sinus Problem This is a new problem. The current episode started in the past 7 days. The problem has been waxing and waning since onset. The maximum temperature recorded prior to his arrival was 102 - 102.9 F. The fever has been present for 1 to 2 days. His pain is at a severity of 4/10. The pain is mild. Associated symptoms include chills, congestion, coughing, headaches and sinus pressure. Past treatments include acetaminophen. The treatment provided mild relief.    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   Impingement syndrome of right shoulder 04/18/2021   Rectal bleeding    Protein-calorie malnutrition, severe 09/29/2020   Acute GI bleeding 09/28/2020   Anemia 09/28/2020   Chronic diarrhea    IDA (iron deficiency anemia)    B12 deficiency    Status post reversal of ileostomy 05/23/2020   Late effect of cerebrovascular accident (CVA) 10/20/2019   Thrombocytosis 09/27/2019   Sleep disturbance    Hyponatremia    Bilateral knee contractures 09/10/2019   Depression with anxiety    SVT (supraventricular tachycardia)    Pressure injury of skin 08/22/2019   Closed displaced fracture of left clavicle    Closed fracture of left wrist    Brainstem infarct, acute (HCC)    Leukocytosis    Closed fracture of shaft of left clavicle 08/06/2019   Fracture of left distal radius 08/06/2019   Bimalleolar ankle fracture, right, closed, initial encounter 08/06/2019   Traumatic injury of vascular supply of small intestine s/p ileocectomy 07/29/2019 07/29/2019   Condyloma acuminatum of penis 07/29/2019   Femur fracture, right (HCC) 07/29/2019   Traumatic hemoperitoneum 07/28/2019   DNR (do not resuscitate) 2020   Knee pain 06/08/2015   BPH (benign prostatic hyperplasia)  05/17/2014   Low testosterone    HTN (hypertension) 02/01/2013   Kidney stones 02/01/2013   Erectile dysfunction 02/01/2013   Dyslipidemia 02/01/2013   Hyperglycemia 02/01/2013   Vitamin D deficiency 02/01/2013   Polyuria 02/01/2013       Review of Systems  Constitutional:  Positive for chills, fatigue and fever.  HENT:  Positive for congestion and sinus pressure.   Respiratory:  Positive for cough.   Musculoskeletal:  Positive for myalgias.  Neurological:  Positive for headaches.       Objective:   Physical Exam Vitals and nursing note reviewed.  Constitutional:      Appearance: Normal appearance. He is well-developed.  HENT:     Head: Normocephalic.     Right Ear: Tympanic membrane normal.     Left Ear: Tympanic membrane normal.     Nose: Congestion and rhinorrhea present.     Mouth/Throat:     Mouth: Mucous membranes are moist.     Pharynx: Oropharynx is clear.  Eyes:     Pupils: Pupils are equal, round, and reactive to light.  Neck:     Thyroid: No thyroid mass or thyromegaly.     Vascular: No carotid bruit or JVD.     Trachea: Phonation normal.  Cardiovascular:     Rate and Rhythm: Normal rate and regular rhythm.  Pulmonary:     Effort: Pulmonary effort is normal. No respiratory distress.     Breath sounds: Normal breath sounds. No wheezing.  Abdominal:  General: Bowel sounds are normal.     Palpations: Abdomen is soft.     Tenderness: There is no abdominal tenderness.  Musculoskeletal:        General: Normal range of motion.     Cervical back: Normal range of motion and neck supple.  Lymphadenopathy:     Cervical: No cervical adenopathy.  Skin:    General: Skin is warm and dry.  Neurological:     Mental Status: He is alert and oriented to person, place, and time.  Psychiatric:        Behavior: Behavior normal.        Thought Content: Thought content normal.        Judgment: Judgment normal.    BP 124/78   Pulse 95   Temp 99 F (37.2 C)  (Temporal)   Resp 20   Ht 5\' 10"  (1.778 m)   Wt 134 lb (60.8 kg)   SpO2 100%   BMI 19.23 kg/m    Flu negative     Assessment & Plan:  Kishore Werthman [place] in today with chief complaint of Cough and Sinus Problem (Negative covid test yesterday/)   1. Nasal congestion - Veritor Flu A/B Waived  2. URI with cough and congestion 1. Take meds as prescribed 2. Use a cool mist humidifier especially during the winter months and when heat has been humid. 3. Use saline nose sprays frequently 4. Saline irrigations of the nose can be very helpful if done frequently.  * 4X daily for 1 week*  * Use of a nettie pot can be helpful with this. Follow directions with this* 5. Drink plenty of fluids 6. Keep thermostat turn down low 7.For any cough or congestion- promethazine DM 8. For fever or aces or pains- take tylenol or ibuprofen appropriate for age and weight.  * for fevers greater than 101 orally you may alternate ibuprofen and tylenol every  3 hours.       The above assessment and management plan was discussed with the patient. The patient verbalized understanding of and has agreed to the management plan. Patient is aware to call the clinic if symptoms persist or worsen. Patient is aware when to return to the clinic for a follow-up visit. Patient educated on when it is appropriate to go to the emergency department.   Mary-Margaret Daphine Deutscher, FNP

## 2023-06-17 ENCOUNTER — Inpatient Hospital Stay: Payer: Medicare Other | Attending: Hematology

## 2023-06-17 ENCOUNTER — Inpatient Hospital Stay: Payer: Medicare Other

## 2023-06-17 DIAGNOSIS — Z9049 Acquired absence of other specified parts of digestive tract: Secondary | ICD-10-CM | POA: Insufficient documentation

## 2023-06-17 DIAGNOSIS — D500 Iron deficiency anemia secondary to blood loss (chronic): Secondary | ICD-10-CM | POA: Diagnosis not present

## 2023-06-17 DIAGNOSIS — K909 Intestinal malabsorption, unspecified: Secondary | ICD-10-CM | POA: Diagnosis not present

## 2023-06-17 DIAGNOSIS — K922 Gastrointestinal hemorrhage, unspecified: Secondary | ICD-10-CM | POA: Diagnosis not present

## 2023-06-17 DIAGNOSIS — Z8042 Family history of malignant neoplasm of prostate: Secondary | ICD-10-CM | POA: Diagnosis not present

## 2023-06-17 LAB — CBC WITH DIFFERENTIAL/PLATELET
Abs Immature Granulocytes: 0.01 10*3/uL (ref 0.00–0.07)
Basophils Absolute: 0.1 10*3/uL (ref 0.0–0.1)
Basophils Relative: 3 %
Eosinophils Absolute: 0.2 10*3/uL (ref 0.0–0.5)
Eosinophils Relative: 4 %
HCT: 35.2 % — ABNORMAL LOW (ref 39.0–52.0)
Hemoglobin: 10.9 g/dL — ABNORMAL LOW (ref 13.0–17.0)
Immature Granulocytes: 0 %
Lymphocytes Relative: 23 %
Lymphs Abs: 0.9 10*3/uL (ref 0.7–4.0)
MCH: 32.6 pg (ref 26.0–34.0)
MCHC: 31 g/dL (ref 30.0–36.0)
MCV: 105.4 fL — ABNORMAL HIGH (ref 80.0–100.0)
Monocytes Absolute: 0.5 10*3/uL (ref 0.1–1.0)
Monocytes Relative: 13 %
Neutro Abs: 2.3 10*3/uL (ref 1.7–7.7)
Neutrophils Relative %: 57 %
Platelets: 499 10*3/uL — ABNORMAL HIGH (ref 150–400)
RBC: 3.34 MIL/uL — ABNORMAL LOW (ref 4.22–5.81)
RDW: 19 % — ABNORMAL HIGH (ref 11.5–15.5)
WBC: 4 10*3/uL (ref 4.0–10.5)
nRBC: 0 % (ref 0.0–0.2)

## 2023-06-17 LAB — IRON AND TIBC
Iron: 36 ug/dL — ABNORMAL LOW (ref 45–182)
Saturation Ratios: 9 % — ABNORMAL LOW (ref 17.9–39.5)
TIBC: 394 ug/dL (ref 250–450)
UIBC: 358 ug/dL

## 2023-06-17 LAB — SAMPLE TO BLOOD BANK

## 2023-06-17 LAB — FERRITIN: Ferritin: 164 ng/mL (ref 24–336)
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
## 2023-06-24 ENCOUNTER — Inpatient Hospital Stay (HOSPITAL_BASED_OUTPATIENT_CLINIC_OR_DEPARTMENT_OTHER): Payer: Medicare Other | Admitting: Physician Assistant

## 2023-06-24 VITALS — BP 128/85 | HR 103 | Temp 98.7°F | Resp 18 | Wt 135.6 lb

## 2023-06-24 DIAGNOSIS — K922 Gastrointestinal hemorrhage, unspecified: Secondary | ICD-10-CM | POA: Diagnosis not present

## 2023-06-24 DIAGNOSIS — D500 Iron deficiency anemia secondary to blood loss (chronic): Secondary | ICD-10-CM | POA: Diagnosis not present

## 2023-06-24 DIAGNOSIS — Z9049 Acquired absence of other specified parts of digestive tract: Secondary | ICD-10-CM | POA: Diagnosis not present

## 2023-06-24 DIAGNOSIS — K909 Intestinal malabsorption, unspecified: Secondary | ICD-10-CM | POA: Diagnosis not present

## 2023-06-24 NOTE — Patient Instructions (Addendum)
[facility] **VISIT SUMMARY & IMPORTANT INSTRUCTIONS **   You were seen today by Rojelio Brenner PA-C for your iron deficiency anemia.   Your blood and iron levels have improved afte your most recent IV iron, but are still lower than they need to be.  This is most likely due to gastrointestinal bleeding as well as poor absorption of nutrients. Rather than scheduling you for additional IV iron at this time, you can continue to take your iron pill once a day to see if this will improve your levels further.  You should continue your monthly B12 injections at home. You should continue to follow closely with your gastroenterologist for treatment of your GI bleeding and short gut syndrome  FOLLOW-UP APPOINTMENT: We will check labs and see you for follow-up PHONE visit in 6 weeks  ** Thank you for trusting me with your healthcare!  I strive to provide all of my patients with quality care at each visit.  If you receive a survey for this visit, I would be so grateful to you for taking the time to provide feedback.  Thank you in advance!  ~ Chiana Wamser                   Dr. Doreatha Massed   &   Rojelio Brenner, PA-C   - - - - - - - - - - - - - - - - - -    Thank you for choosing  [facility] Center at [facility] to provide your oncology and hematology care.  To afford each patient quality time with our provider, please arrive at least 15 minutes before your scheduled appointment time.   If you have a lab appointment with the Cancer Center please come in thru the Main Entrance and check in at the main information desk.  You need to re-schedule your appointment should you arrive 10 or more minutes late.  We strive to give you quality time with our providers, and arriving late affects you and other patients whose appointments are after yours.  Also, if you no show three or more times for appointments you may be dismissed from the clinic at the  providers discretion.     Again, thank you for choosing [facility].  Our hope is that these requests will decrease the amount of time that you wait before being seen by our physicians.       _____________________________________________________________  Should you have questions after your visit to [facility], please contact our office at [phone] and follow the prompts.  Our office hours are 8:00 a.m. and 4:30 p.m. Monday - Friday.  Please note that voicemails left after 4:00 p.m. may not be returned until the following business day.  We are closed weekends and major holidays.  You do have access to a nurse 24-7, just call the main number to the clinic [phone] and do not press any options, hold on the line and a nurse will answer the phone.    For prescription refill requests, have your pharmacy contact our office and allow 72 hours.

## 2023-07-01 ENCOUNTER — Other Ambulatory Visit: Payer: Medicare Other

## 2023-07-01 ENCOUNTER — Ambulatory Visit: Payer: Medicare Other | Admitting: Physician Assistant

## 2023-07-29 ENCOUNTER — Ambulatory Visit: Payer: Medicare Other | Admitting: Family

## 2023-07-31 ENCOUNTER — Ambulatory Visit: Payer: Medicare Other | Admitting: Family

## 2023-08-05 NOTE — Addendum Note (Signed)
Addended by: Cynda Acres A on: 08/05/2023 09:30 AM   Modules accepted: Orders

## 2023-08-07 ENCOUNTER — Ambulatory Visit: Payer: Medicare Other | Admitting: Family

## 2023-08-07 ENCOUNTER — Inpatient Hospital Stay: Payer: Medicare Other

## 2023-08-07 ENCOUNTER — Encounter: Payer: Self-pay | Admitting: Family

## 2023-08-07 VITALS — BP 115/69 | HR 78 | Temp 99.5°F | Ht 70.0 in | Wt 131.6 lb

## 2023-08-07 DIAGNOSIS — E559 Vitamin D deficiency, unspecified: Secondary | ICD-10-CM | POA: Diagnosis not present

## 2023-08-07 DIAGNOSIS — D500 Iron deficiency anemia secondary to blood loss (chronic): Secondary | ICD-10-CM | POA: Diagnosis not present

## 2023-08-07 DIAGNOSIS — Z0001 Encounter for general adult medical examination with abnormal findings: Secondary | ICD-10-CM

## 2023-08-07 DIAGNOSIS — I10 Essential (primary) hypertension: Secondary | ICD-10-CM | POA: Diagnosis not present

## 2023-08-07 DIAGNOSIS — I6930 Unspecified sequelae of cerebral infarction: Secondary | ICD-10-CM | POA: Diagnosis not present

## 2023-08-07 DIAGNOSIS — F5101 Primary insomnia: Secondary | ICD-10-CM | POA: Diagnosis not present

## 2023-08-07 DIAGNOSIS — N401 Enlarged prostate with lower urinary tract symptoms: Secondary | ICD-10-CM | POA: Diagnosis not present

## 2023-08-07 DIAGNOSIS — E785 Hyperlipidemia, unspecified: Secondary | ICD-10-CM | POA: Diagnosis not present

## 2023-08-07 DIAGNOSIS — E538 Deficiency of other specified B group vitamins: Secondary | ICD-10-CM | POA: Diagnosis not present

## 2023-08-07 DIAGNOSIS — Z0000 Encounter for general adult medical examination without abnormal findings: Secondary | ICD-10-CM

## 2023-08-07 DIAGNOSIS — R351 Nocturia: Secondary | ICD-10-CM

## 2023-08-07 DIAGNOSIS — D649 Anemia, unspecified: Secondary | ICD-10-CM | POA: Diagnosis not present

## 2023-08-07 MED ORDER — TRAZODONE HCL 50 MG PO TABS
25.0000 mg | ORAL_TABLET | Freq: Every evening | ORAL | 3 refills | Status: DC | PRN
Start: 2023-08-07 — End: 2024-02-03

## 2023-08-07 NOTE — Patient Instructions (Signed)
Insomnia Insomnia is a sleep disorder that makes it difficult to fall asleep or stay asleep. Insomnia can cause fatigue, low energy, difficulty concentrating, mood swings, and poor performance at work or school. There are three different ways to classify insomnia: Difficulty falling asleep. Difficulty staying asleep. Waking up too early in the morning. Any type of insomnia can be long-term (chronic) or short-term (acute). Both are common. Short-term insomnia usually lasts for 3 months or less. Chronic insomnia occurs at least three times a week for longer than 3 months. What are the causes? Insomnia may be caused by another condition, situation, or substance, such as: Having certain mental health conditions, such as anxiety and depression. Using caffeine, alcohol, tobacco, or drugs. Having gastrointestinal conditions, such as gastroesophageal reflux disease (GERD). Having certain medical conditions. These include: Asthma. Alzheimer's disease. Stroke. Chronic pain. An overactive thyroid gland (hyperthyroidism). Other sleep disorders, such as restless legs syndrome and sleep apnea. Menopause. Sometimes, the cause of insomnia may not be known. What increases the risk? Risk factors for insomnia include: Gender. Females are affected more often than males. Age. Insomnia is more common as people get older. Stress and certain medical and mental health conditions. Lack of exercise. Having an irregular work schedule. This may include working night shifts and traveling between different time zones. What are the signs or symptoms? If you have insomnia, the main symptom is having trouble falling asleep or having trouble staying asleep. This may lead to other symptoms, such as: Feeling tired or having low energy. Feeling nervous about going to sleep. Not feeling rested in the morning. Having trouble concentrating. Feeling irritable, anxious, or depressed. How is this diagnosed? This condition  may be diagnosed based on: Your symptoms and medical history. Your health care provider may ask about: Your sleep habits. Any medical conditions you have. Your mental health. A physical exam. How is this treated? Treatment for insomnia depends on the cause. Treatment may focus on treating an underlying condition that is causing the insomnia. Treatment may also include: Medicines to help you sleep. Counseling or therapy. Lifestyle adjustments to help you sleep better. Follow these instructions at home: Eating and drinking  Limit or avoid alcohol, caffeinated beverages, and products that contain nicotine and tobacco, especially close to bedtime. These can disrupt your sleep. Do not eat a large meal or eat spicy foods right before bedtime. This can lead to digestive discomfort that can make it hard for you to sleep. Sleep habits  Keep a sleep diary to help you and your health care provider figure out what could be causing your insomnia. Write down: When you sleep. When you wake up during the night. How well you sleep and how rested you feel the next day. Any side effects of medicines you are taking. What you eat and drink. Make your bedroom a dark, comfortable place where it is easy to fall asleep. Put up shades or blackout curtains to block light from outside. Use a white noise machine to block noise. Keep the temperature cool. Limit screen use before bedtime. This includes: Not watching TV. Not using your smartphone, tablet, or computer. Stick to a routine that includes going to bed and waking up at the same times every day and night. This can help you fall asleep faster. Consider making a quiet activity, such as reading, part of your nighttime routine. Try to avoid taking naps during the day so that you sleep better at night. Get out of bed if you are still awake after   15 minutes of trying to sleep. Keep the lights down, but try reading or doing a quiet activity. When you feel  sleepy, go back to bed. General instructions Take over-the-counter and prescription medicines only as told by your health care provider. Exercise regularly as told by your health care provider. However, avoid exercising in the hours right before bedtime. Use relaxation techniques to manage stress. Ask your health care provider to suggest some techniques that may work well for you. These may include: Breathing exercises. Routines to release muscle tension. Visualizing peaceful scenes. Make sure that you drive carefully. Do not drive if you feel very sleepy. Keep all follow-up visits. This is important. Contact a health care provider if: You are tired throughout the day. You have trouble in your daily routine due to sleepiness. You continue to have sleep problems, or your sleep problems get worse. Get help right away if: You have thoughts about hurting yourself or someone else. Get help right away if you feel like you may hurt yourself or others, or have thoughts about taking your own life. Go to your nearest emergency room or: Call 911. Call the National Suicide Prevention Lifeline at 1-800-273-8255 or 988. This is open 24 hours a day. Text the Crisis Text Line at 741741. Summary Insomnia is a sleep disorder that makes it difficult to fall asleep or stay asleep. Insomnia can be long-term (chronic) or short-term (acute). Treatment for insomnia depends on the cause. Treatment may focus on treating an underlying condition that is causing the insomnia. Keep a sleep diary to help you and your health care provider figure out what could be causing your insomnia. This information is not intended to replace advice given to you by your health care provider. Make sure you discuss any questions you have with your health care provider. Document Revised: 09/03/2021 Document Reviewed: 09/03/2021 Elsevier Patient Education  2024 Elsevier Inc.  
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
## 2023-08-08 ENCOUNTER — Other Ambulatory Visit: Payer: Self-pay | Admitting: Family

## 2023-08-08 LAB — CBC WITH DIFFERENTIAL/PLATELET
Basophils Absolute: 0.1 10*3/uL (ref 0.0–0.2)
Basos: 2 %
EOS (ABSOLUTE): 0.2 10*3/uL (ref 0.0–0.4)
Eos: 4 %
Hematocrit: 38.2 % (ref 37.5–51.0)
Hemoglobin: 11.9 g/dL — ABNORMAL LOW (ref 13.0–17.7)
Immature Grans (Abs): 0 10*3/uL (ref 0.0–0.1)
Immature Granulocytes: 0 %
Lymphocytes Absolute: 0.8 10*3/uL (ref 0.7–3.1)
Lymphs: 20 %
MCH: 32.5 pg (ref 26.6–33.0)
MCHC: 31.2 g/dL — ABNORMAL LOW (ref 31.5–35.7)
MCV: 104 fL — ABNORMAL HIGH (ref 79–97)
Monocytes Absolute: 0.5 10*3/uL (ref 0.1–0.9)
Monocytes: 12 %
Neutrophils Absolute: 2.4 10*3/uL (ref 1.4–7.0)
Neutrophils: 62 %
Platelets: 448 10*3/uL (ref 150–450)
RBC: 3.66 x10E6/uL — ABNORMAL LOW (ref 4.14–5.80)
RDW: 11.8 % (ref 11.6–15.4)
WBC: 3.9 10*3/uL (ref 3.4–10.8)

## 2023-08-08 LAB — ANEMIA PROFILE B
Basophils Absolute: 0.1 10*3/uL (ref 0.0–0.2)
Basos: 2 %
EOS (ABSOLUTE): 0.2 10*3/uL (ref 0.0–0.4)
Eos: 4 %
Ferritin: 40 ng/mL (ref 30–400)
Folate: >20 ng/mL (ref >3.0)
Hematocrit: 38.2 % (ref 37.5–51.0)
Hemoglobin: 12.2 g/dL — ABNORMAL LOW (ref 13.0–17.7)
Immature Grans (Abs): 0 10*3/uL (ref 0.0–0.1)
Immature Granulocytes: 0 %
Iron Saturation: 20 % (ref 15–55)
Iron: 80 ug/dL (ref 38–169)
Lymphocytes Absolute: 0.8 10*3/uL (ref 0.7–3.1)
Lymphs: 21 %
MCH: 33.6 pg — ABNORMAL HIGH (ref 26.6–33.0)
MCHC: 31.9 g/dL (ref 31.5–35.7)
MCV: 105 fL — ABNORMAL HIGH (ref 79–97)
Monocytes Absolute: 0.5 10*3/uL (ref 0.1–0.9)
Monocytes: 11 %
Neutrophils Absolute: 2.5 10*3/uL (ref 1.4–7.0)
Neutrophils: 62 %
Platelets: 437 10*3/uL (ref 150–450)
RBC: 3.63 x10E6/uL — ABNORMAL LOW (ref 4.14–5.80)
RDW: 12 % (ref 11.6–15.4)
Retic Ct Pct: 2.5 % (ref 0.6–2.6)
Total Iron Binding Capacity: 401 ug/dL (ref 250–450)
UIBC: 321 ug/dL (ref 111–343)
Vitamin B-12: 497 pg/mL (ref 232–1245)
WBC: 4 10*3/uL (ref 3.4–10.8)

## 2023-08-08 LAB — PSA, TOTAL AND FREE
PSA, Free Pct: 30 %
PSA, Free: 0.09 ng/mL
Prostate Specific Ag, Serum: 0.3 ng/mL (ref 0.0–4.0)

## 2023-08-08 LAB — CMP14+EGFR
ALT: 19 [IU]/L (ref 0–44)
AST: 26 [IU]/L (ref 0–40)
Albumin: 4.1 g/dL (ref 3.9–4.9)
Alkaline Phosphatase: 76 [IU]/L (ref 44–121)
BUN/Creatinine Ratio: 20 (ref 10–24)
BUN: 19 mg/dL (ref 8–27)
Bilirubin Total: <0.2 mg/dL (ref 0.0–1.2)
CO2: 23 mmol/L (ref 20–29)
Calcium: 9.4 mg/dL (ref 8.6–10.2)
Chloride: 106 mmol/L (ref 96–106)
Creatinine, Ser: 0.93 mg/dL (ref 0.76–1.27)
Globulin, Total: 2.1 g/dL (ref 1.5–4.5)
Glucose: 97 mg/dL (ref 70–99)
Potassium: 4.6 mmol/L (ref 3.5–5.2)
Sodium: 139 mmol/L (ref 134–144)
Total Protein: 6.2 g/dL (ref 6.0–8.5)
eGFR: 92 mL/min/{1.73_m2} (ref >59)

## 2023-08-08 LAB — VITAMIN D 25 HYDROXY (VIT D DEFICIENCY, FRACTURES): Vit D, 25-Hydroxy: 28 ng/mL — ABNORMAL LOW (ref 30.0–100.0)

## 2023-08-08 LAB — LIPID PANEL
Chol/HDL Ratio: 1.9 ratio (ref 0.0–5.0)
Cholesterol, Total: 140 mg/dL (ref 100–199)
HDL: 72 mg/dL (ref >39)
LDL Chol Calc (NIH): 43 mg/dL (ref 0–99)
Triglycerides: 153 mg/dL — ABNORMAL HIGH (ref 0–149)
VLDL Cholesterol Cal: 25 mg/dL (ref 5–40)

## 2023-08-08 LAB — IRON AND TIBC
Iron Saturation: 20 % (ref 15–55)
Iron: 81 ug/dL (ref 38–169)
Total Iron Binding Capacity: 403 ug/dL (ref 250–450)
UIBC: 322 ug/dL (ref 111–343)

## 2023-08-08 LAB — FERRITIN: Ferritin: 42 ng/mL (ref 30–400)

## 2023-08-08 MED ORDER — VITAMIN D (ERGOCALCIFEROL) 1.25 MG (50000 UNIT) PO CAPS: 50000.0000 [IU] | ORAL_CAPSULE | ORAL | 1 refills | Status: DC

## 2023-08-14 ENCOUNTER — Inpatient Hospital Stay: Payer: Medicare Other | Attending: Hematology | Admitting: Oncology

## 2023-08-14 DIAGNOSIS — D500 Iron deficiency anemia secondary to blood loss (chronic): Secondary | ICD-10-CM | POA: Diagnosis not present

## 2023-08-14 DIAGNOSIS — E559 Vitamin D deficiency, unspecified: Secondary | ICD-10-CM

## 2023-08-14 NOTE — Progress Notes (Signed)
Virtual Visit via Telephone Note  I connected with [name] on 08/14/23 at 11:00 AM EST by telephone and verified that I am speaking with the correct person using two identifiers.  Location: Patient: Home  Provider: Clinic    I discussed the limitations, risks, security and privacy concerns of performing an evaluation and management service by telephone and the availability of in person appointments. I also discussed with the patient that there may be a patient responsible charge related to this service. The patient expressed understanding and agreed to proceed.   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  He was last seen in clinic on 06/24/2023.  He receives intermittent IV iron last given on 05/20/2023 and 06/03/2023.  Since his last visit, he denies any hospitalizations, surgeries or changes in his baseline health.  Appetite is 100% energy levels are 75%.  Denies any pain.  Has trouble swallowing secondary to past history of a stroke.  Has intermittent diarrhea secondary to IBS.  Has numbness and tingling in bilateral lower extremities.  All of these are problems and are under control.  He denies any ice pica, lightheadedness, syncope or headaches.  He denies any recurrent rectal bleeding or obvious melena.  He continues oral iron supplements twice daily for the last 2 to 3 years and recently switched brand of iron supplements to Heema Plex from [employer]atures made which he feels has made a difference in how he feels and his labs.  He also receives monthly B12 injections (given at home by his wife).  Reports his energy levels have improved greatly over the past 2 months.  He was started on ergocalciferol 50,000 units weekly by his PCP.  He has been on this for approximately 6 months.  Observations/Objective: Review of Systems  HENT:         Trouble swallowing   Gastrointestinal:  Positive for diarrhea.  Neurological:  Positive for sensory change.    Physical Exam Neurological:     Mental Status: He is alert and oriented to person, place, and time.    Assessment and Plan: 1. Iron deficiency anemia due to chronic blood loss -Most recent labs from 08/07/2023 show hemoglobin of 12.2, ferritin 40 (164), TIBC 401 and iron saturation 20%. -No additional IV iron needed at this time. -Continue Hema Plex iron supplements twice daily. -Return to clinic in approximately 3 months for repeat labs and office visit.  2. Vitamin D deficiency -Most recent vitamin D level was 28. -She is currently on ergocalciferol 50,000 units weekly. -Continue to monitor vitamin D level.  Follow Up Instructions: PLAN SUMMARY: >> No additional IV iron needed at this time. >> Return to clinic in 3 months with labs a few days before and office visit.     I discussed the assessment and treatment plan with the patient. The patient was provided an opportunity to ask questions and all were answered. The patient agreed with the plan and demonstrated an understanding of the instructions.   The patient was advised to call back or seek an in-person evaluation if the symptoms worsen or if the condition fails to improve as anticipated.  I provided 20 minutes of non-face-to-face time during this encounter.   Mauro Kaufmann, NP

## 2023-08-14 NOTE — Progress Notes (Signed)
Patient called for triage in relation to phone visit with Durenda Hurt PA. No answer, message left with call back number.  Patient returned call, triage done.

## 2023-09-10 DIAGNOSIS — N201 Calculus of ureter: Secondary | ICD-10-CM | POA: Diagnosis not present

## 2023-09-10 DIAGNOSIS — R310 Gross hematuria: Secondary | ICD-10-CM | POA: Diagnosis not present

## 2023-09-10 DIAGNOSIS — R3915 Urgency of urination: Secondary | ICD-10-CM | POA: Diagnosis not present

## 2023-09-17 DIAGNOSIS — H40053 Ocular hypertension, bilateral: Secondary | ICD-10-CM | POA: Diagnosis not present

## 2023-09-24 DIAGNOSIS — N200 Calculus of kidney: Secondary | ICD-10-CM | POA: Diagnosis not present

## 2023-09-24 DIAGNOSIS — N201 Calculus of ureter: Secondary | ICD-10-CM | POA: Diagnosis not present

## 2023-09-24 DIAGNOSIS — R310 Gross hematuria: Secondary | ICD-10-CM | POA: Diagnosis not present

## 2023-09-29 ENCOUNTER — Other Ambulatory Visit: Payer: Self-pay | Admitting: Gastroenterology

## 2023-10-28 DIAGNOSIS — L570 Actinic keratosis: Secondary | ICD-10-CM | POA: Diagnosis not present

## 2023-10-28 DIAGNOSIS — L853 Xerosis cutis: Secondary | ICD-10-CM | POA: Diagnosis not present

## 2023-11-07 ENCOUNTER — Inpatient Hospital Stay: Payer: Medicare Other | Attending: Hematology

## 2023-11-07 DIAGNOSIS — D500 Iron deficiency anemia secondary to blood loss (chronic): Secondary | ICD-10-CM | POA: Diagnosis not present

## 2023-11-07 DIAGNOSIS — K922 Gastrointestinal hemorrhage, unspecified: Secondary | ICD-10-CM | POA: Diagnosis not present

## 2023-11-07 LAB — CBC
HCT: 34.6 % — ABNORMAL LOW (ref 39.0–52.0)
Hemoglobin: 10.8 g/dL — ABNORMAL LOW (ref 13.0–17.0)
MCH: 35.6 pg — ABNORMAL HIGH (ref 26.0–34.0)
MCHC: 31.2 g/dL (ref 30.0–36.0)
MCV: 114.2 fL — ABNORMAL HIGH (ref 80.0–100.0)
Platelets: 402 10*3/uL — ABNORMAL HIGH (ref 150–400)
RBC: 3.03 MIL/uL — ABNORMAL LOW (ref 4.22–5.81)
RDW: 14.3 % (ref 11.5–15.5)
WBC: 5.2 10*3/uL (ref 4.0–10.5)
nRBC: 0 % (ref 0.0–0.2)

## 2023-11-07 LAB — SAMPLE TO BLOOD BANK

## 2023-11-13 ENCOUNTER — Other Ambulatory Visit: Payer: Self-pay | Admitting: *Deleted

## 2023-11-13 DIAGNOSIS — D500 Iron deficiency anemia secondary to blood loss (chronic): Secondary | ICD-10-CM

## 2023-11-14 ENCOUNTER — Inpatient Hospital Stay: Payer: Medicare Other

## 2023-11-14 ENCOUNTER — Inpatient Hospital Stay: Payer: Medicare Other | Attending: Hematology | Admitting: Oncology

## 2023-11-14 ENCOUNTER — Encounter: Payer: Self-pay | Admitting: Oncology

## 2023-11-14 DIAGNOSIS — E559 Vitamin D deficiency, unspecified: Secondary | ICD-10-CM | POA: Diagnosis not present

## 2023-11-14 DIAGNOSIS — D500 Iron deficiency anemia secondary to blood loss (chronic): Secondary | ICD-10-CM | POA: Diagnosis not present

## 2023-11-14 DIAGNOSIS — Z0000 Encounter for general adult medical examination without abnormal findings: Secondary | ICD-10-CM | POA: Diagnosis not present

## 2023-11-14 DIAGNOSIS — K922 Gastrointestinal hemorrhage, unspecified: Secondary | ICD-10-CM | POA: Diagnosis not present

## 2023-11-14 LAB — FERRITIN: Ferritin: 38 ng/mL (ref 24–336)

## 2023-11-14 LAB — CBC
HCT: 36.6 % — ABNORMAL LOW (ref 39.0–52.0)
Hemoglobin: 11.2 g/dL — ABNORMAL LOW (ref 13.0–17.0)
MCH: 34.6 pg — ABNORMAL HIGH (ref 26.0–34.0)
MCHC: 30.6 g/dL (ref 30.0–36.0)
MCV: 113 fL — ABNORMAL HIGH (ref 80.0–100.0)
Platelets: 366 10*3/uL (ref 150–400)
RBC: 3.24 MIL/uL — ABNORMAL LOW (ref 4.22–5.81)
RDW: 13.2 % (ref 11.5–15.5)
WBC: 4.6 10*3/uL (ref 4.0–10.5)
nRBC: 0 % (ref 0.0–0.2)

## 2023-11-14 LAB — IRON AND TIBC
Iron: 73 ug/dL (ref 45–182)
Saturation Ratios: 17 % — ABNORMAL LOW (ref 17.9–39.5)
TIBC: 442 ug/dL (ref 250–450)
UIBC: 369 ug/dL

## 2023-11-14 LAB — SAMPLE TO BLOOD BANK

## 2023-11-14 NOTE — Progress Notes (Signed)
 [name] [address], [place] [address]    CLINIC:  Medical Oncology/Hematology   PCP:  Kenneth Bari LABOR, [name] [address] 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  He was last seen in clinic on 08/14/2023.  He receives intermittent IV iron  last given on 05/20/2023 and 06/03/2023.  Since his last visit, he denies any hospitalizations, surgeries or changes in his baseline health.  Appetite is 100% energy levels are 75%.  Denies any pain.  Has trouble swallowing secondary to past history of a stroke.  Has intermittent diarrhea secondary to IBS.  Has numbness and tingling in bilateral lower extremities.  All of these are problems and are under control.  He denies any ice pica, lightheadedness, syncope or headaches.  He denies any recurrent rectal bleeding or obvious melena.  Reports he has been taking iron  supplements Heema Plex from [employer] made twice daily for several years and is recently increased to 3 times a day.  Denies any unwanted side effects.  He receives monthly B12 injections at home.  Reports a few days of darker looking stools any thinks it may have been blood.  Reports he knows he has a lesion/ulcer in his colon that is likely intermittently bleeding.  No pain.  Wife reports he eats the same meals every single day.  Patient reports he knows exactly will not mess his stomach up although he has had some weight loss.  Observations/Objective: Review of Systems  Constitutional:  Positive for weight loss.  HENT:         Trouble swallowing  Gastrointestinal:  Positive for diarrhea.  Genitourinary:  Positive for hematuria.   Psychiatric/Behavioral:  The patient has insomnia.     Physical Exam Constitutional:      Appearance: Normal appearance.  Cardiovascular:     Rate and Rhythm: Normal rate and regular rhythm.  Pulmonary:     Effort: Pulmonary effort is normal.     Breath sounds: Normal breath sounds.  Abdominal:     General: Bowel sounds are normal.     Palpations: Abdomen is soft.  Musculoskeletal:        General: No swelling. Normal range of motion.  Neurological:     Mental Status: He is alert and oriented to person, place, and time. Mental status is at baseline.    Assessment and Plan: 1. Iron  deficiency anemia due to chronic blood loss -Most recent labs from 11/14/2023 hemoglobin of 11.2, MCV 113.  Iron  saturation 17% and ferritin 38.  -No additional IV iron  needed at this time. -Continue Hema Plex iron  supplements and may increase to 3 daily if he can tolerate. -Return to clinic in approximately 3 months for repeat labs and office visit.  2. Vitamin D  deficiency -Most recent vitamin D  level was 28. -He is currently on ergocalciferol  50,000 units weekly. -Continue to monitor vitamin D  level.  Follow Up Instructions: PLAN SUMMARY: >> No additional IV iron  needed at this time. >> Continue Heema Plex iron  supplements and may increase to 3/4 daily depending on tolerance. >> Return to clinic in 3 months with labs a few days before and office visit.  I discussed the assessment and treatment plan with the patient. The patient was provided an opportunity to ask questions and all were answered. The patient agreed with the plan and demonstrated an understanding of the instructions.   The patient was advised to call back or seek an in-person evaluation if the symptoms worsen or if the condition fails to improve as anticipated.  I spent 25 minutes dedicated to the care of this patient (face-to-face and non-face-to-face) on the date of the encounter to include what is described in the assessment  and plan.   Kenneth FORBES Hope, NP

## 2023-12-18 DIAGNOSIS — H40053 Ocular hypertension, bilateral: Secondary | ICD-10-CM | POA: Diagnosis not present

## 2024-01-14 ENCOUNTER — Other Ambulatory Visit: Payer: Self-pay | Admitting: Family

## 2024-01-17 ENCOUNTER — Other Ambulatory Visit: Payer: Self-pay | Admitting: Gastroenterology

## 2024-01-17 ENCOUNTER — Other Ambulatory Visit: Payer: Self-pay | Admitting: Family

## 2024-01-19 ENCOUNTER — Emergency Department (HOSPITAL_COMMUNITY)

## 2024-01-19 ENCOUNTER — Other Ambulatory Visit: Payer: Self-pay

## 2024-01-19 ENCOUNTER — Emergency Department (HOSPITAL_COMMUNITY)
Admission: EM | Admit: 2024-01-19 | Discharge: 2024-01-19 | Disposition: A | Discharge status: Home / Self Care | Attending: Emergency Medicine | Admitting: Emergency Medicine

## 2024-01-19 ENCOUNTER — Encounter (HOSPITAL_COMMUNITY): Payer: Self-pay | Admitting: *Deleted

## 2024-01-19 DIAGNOSIS — N202 Calculus of kidney with calculus of ureter: Secondary | ICD-10-CM | POA: Insufficient documentation

## 2024-01-19 DIAGNOSIS — Z8673 Personal history of transient ischemic attack (TIA), and cerebral infarction without residual deficits: Secondary | ICD-10-CM | POA: Insufficient documentation

## 2024-01-19 DIAGNOSIS — N200 Calculus of kidney: Secondary | ICD-10-CM

## 2024-01-19 DIAGNOSIS — R112 Nausea with vomiting, unspecified: Secondary | ICD-10-CM | POA: Diagnosis not present

## 2024-01-19 DIAGNOSIS — N134 Hydroureter: Secondary | ICD-10-CM | POA: Diagnosis not present

## 2024-01-19 DIAGNOSIS — R109 Unspecified abdominal pain: Secondary | ICD-10-CM | POA: Diagnosis not present

## 2024-01-19 DIAGNOSIS — N132 Hydronephrosis with renal and ureteral calculous obstruction: Secondary | ICD-10-CM | POA: Diagnosis not present

## 2024-01-19 DIAGNOSIS — K8020 Calculus of gallbladder without cholecystitis without obstruction: Secondary | ICD-10-CM | POA: Diagnosis not present

## 2024-01-19 LAB — COMPREHENSIVE METABOLIC PANEL WITH GFR
ALT: 21 U/L (ref 0–44)
AST: 26 U/L (ref 15–41)
Albumin: 3.6 g/dL (ref 3.5–5.0)
Alkaline Phosphatase: 57 U/L (ref 38–126)
Anion gap: 11 (ref 5–15)
BUN: 16 mg/dL (ref 8–23)
CO2: 24 mmol/L (ref 22–32)
Calcium: 9.3 mg/dL (ref 8.9–10.3)
Chloride: 102 mmol/L (ref 98–111)
Creatinine, Ser: 1.07 mg/dL (ref 0.61–1.24)
GFR, Estimated: >60 mL/min (ref >60)
Glucose, Bld: 122 mg/dL — ABNORMAL HIGH (ref 70–99)
Potassium: 4.4 mmol/L (ref 3.5–5.1)
Sodium: 137 mmol/L (ref 135–145)
Total Bilirubin: 0.4 mg/dL (ref 0.0–1.2)
Total Protein: 6.4 g/dL — ABNORMAL LOW (ref 6.5–8.1)

## 2024-01-19 LAB — CBC WITH DIFFERENTIAL/PLATELET
Abs Immature Granulocytes: 0.03 10*3/uL (ref 0.00–0.07)
Basophils Absolute: 0.1 10*3/uL (ref 0.0–0.1)
Basophils Relative: 1 %
Eosinophils Absolute: 0 10*3/uL (ref 0.0–0.5)
Eosinophils Relative: 0 %
HCT: 34.6 % — ABNORMAL LOW (ref 39.0–52.0)
Hemoglobin: 11.1 g/dL — ABNORMAL LOW (ref 13.0–17.0)
Immature Granulocytes: 0 %
Lymphocytes Relative: 3 %
Lymphs Abs: 0.3 10*3/uL — ABNORMAL LOW (ref 0.7–4.0)
MCH: 35.5 pg — ABNORMAL HIGH (ref 26.0–34.0)
MCHC: 32.1 g/dL (ref 30.0–36.0)
MCV: 110.5 fL — ABNORMAL HIGH (ref 80.0–100.0)
Monocytes Absolute: 0.6 10*3/uL (ref 0.1–1.0)
Monocytes Relative: 4 %
Neutro Abs: 12 10*3/uL — ABNORMAL HIGH (ref 1.7–7.7)
Neutrophils Relative %: 92 %
Platelets: 404 10*3/uL — ABNORMAL HIGH (ref 150–400)
RBC: 3.13 MIL/uL — ABNORMAL LOW (ref 4.22–5.81)
RDW: 13.5 % (ref 11.5–15.5)
WBC: 13.1 10*3/uL — ABNORMAL HIGH (ref 4.0–10.5)
nRBC: 0 % (ref 0.0–0.2)

## 2024-01-19 LAB — URINALYSIS, ROUTINE W REFLEX MICROSCOPIC
Bilirubin Urine: NEGATIVE
Glucose, UA: NEGATIVE mg/dL
Ketones, ur: NEGATIVE mg/dL
Leukocytes,Ua: NEGATIVE
Nitrite: NEGATIVE
Protein, ur: 30 mg/dL — AB
RBC / HPF: >50 RBC/hpf (ref 0–5)
Specific Gravity, Urine: 1.014 (ref 1.005–1.030)
pH: 7 (ref 5.0–8.0)

## 2024-01-19 LAB — LIPASE, BLOOD: Lipase: 28 U/L (ref 11–51)

## 2024-01-19 MED ORDER — HYDROMORPHONE HCL 1 MG/ML IJ SOLN
0.5000 mg | Freq: Once | INTRAMUSCULAR | Status: AC
  Administered 2024-01-19: 0.5 mg via INTRAVENOUS
  Filled 2024-01-19: qty 0.5

## 2024-01-19 MED ORDER — SODIUM CHLORIDE 0.9 % IV BOLUS
1000.0000 mL | Freq: Once | INTRAVENOUS | Status: AC
  Administered 2024-01-19: 1000 mL via INTRAVENOUS

## 2024-01-19 MED ORDER — ONDANSETRON HCL 4 MG/2ML IJ SOLN
4.0000 mg | Freq: Once | INTRAMUSCULAR | Status: AC
  Administered 2024-01-19: 4 mg via INTRAVENOUS
  Filled 2024-01-19: qty 2

## 2024-01-19 MED ORDER — OXYCODONE HCL 5 MG PO TABS: 5.0000 mg | ORAL_TABLET | ORAL | 0 refills | Status: DC | PRN

## 2024-01-19 MED ORDER — ONDANSETRON 4 MG PO TBDP: 4.0000 mg | ORAL_TABLET | Freq: Three times a day (TID) | ORAL | 0 refills | Status: DC | PRN

## 2024-01-19 MED ORDER — TAMSULOSIN HCL 0.4 MG PO CAPS: 0.4000 mg | ORAL_CAPSULE | Freq: Every day | ORAL | 0 refills | Status: DC

## 2024-01-19 MED ORDER — KETOROLAC TROMETHAMINE 15 MG/ML IJ SOLN: 15.0000 mg | Freq: Once | INTRAMUSCULAR | Status: DC

## 2024-01-19 NOTE — ED Notes (Signed)
 Pt reminded on the need for a urine sample.

## 2024-01-19 NOTE — ED Provider Notes (Signed)
 [place] EMERGENCY DEPARTMENT AT [facility] Provider Note   CSN: [id number] Arrival date & time: 01/19/24  1031     History  Chief Complaint  Patient presents with   Flank Pain    [name] is a 63 y.o. male.  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  Patient reports that yesterday started experiencing some right-sided flank pain.  Initially was intermittent.  Since has become constant.  Describes it as sharp.  Has been throwing up for the past 2 hours and decided to come in.  Says that it feels similar to prior kidney stone.  No fevers.  No dysuria or frequency.       Home Medications Prior to Admission medications   Medication Sig Start Date End Date Taking? Authorizing Provider  ondansetron (ZOFRAN-ODT) 4 MG disintegrating tablet Take 1 tablet (4 mg total) by mouth every 8 (eight) hours as needed for nausea or vomiting. 01/19/24  Yes Rondel Baton, MD  oxyCODONE (ROXICODONE) 5 MG immediate release tablet Take 1 tablet (5 mg total) by mouth every 4 (four) hours as needed for severe pain (pain score 7-10). 01/19/24  Yes Rondel Baton, MD  tamsulosin [facility]) 0.4 MG CAPS capsule Take 1 capsule (0.4 mg total) by mouth daily after breakfast. 01/19/24  Yes Rondel Baton, MD  Capsicum, Cayenne, (CAYENNE PO) Take 3 capsules by mouth daily.    [provider]  cyanocobalamin (VITAMIN B12) 1000 MCG/ML injection INJECT ONCE MONTHLY AS DIRECTED *STOP TAKING ORAL VITAMIN B12) 09/29/23   Tiffany Kocher, PA-C  diphenhydramine-acetaminophen (TYLENOL PM) 25-500 MG TABS tablet Take 2 tablets by mouth at bedtime.    [provider]  Fe Fum-DSS-C-E-B12-IF-FA (FERRO-PLEX HEMATINIC PO) Take 80 mg by mouth daily.    [provider]  hydrocortisone  (ANUSOL-HC) 2.5 % rectal cream Place 1 Application rectally 2 (two) times daily as needed for hemorrhoids or anal itching.    [provider]  loperamide (IMODIUM) 2 MG capsule TAKE 1 CAPSULE (2 MG TOTAL) BY MOUTH AS NEEDED FOR DIARRHEA OR LOOSE STOOLS (UP TO 8 PER DAY). 01/02/23   Tiffany Kocher, PA-C  Multiple Vitamin (MULTIVITAMIN WITH MINERALS) TABS tablet Take 1 tablet by mouth in the morning and at bedtime.    [provider]  SYRINGE-NEEDLE, DISP, 3 ML (BD ECLIPSE SYRINGE) 25G X 1" 3 ML MISC 1 inch by Does not apply route every 30 (thirty) days. Whatever insurance covers for B12 injections 11/10/20   Jannifer Rodney A, FNP  traZODone (DESYREL) 50 MG tablet Take 0.5-1 tablets (25-50 mg total) by mouth at bedtime as needed for sleep. 08/07/23   Jannifer Rodney A, FNP  TURMERIC CURCUMIN PO Take 2 capsules by mouth daily.    [provider]  Vitamin D, Ergocalciferol, (DRISDOL) 1.25 MG (50000 UNIT) CAPS capsule TAKE 1 CAPSULE (50,000 UNITS TOTAL) BY MOUTH EVERY 7 (SEVEN) DAYS 01/15/24   Junie Spencer, FNP      Allergies    Tape    Review of Systems   Review of Systems  Physical Exam Updated Vital Signs BP 101/71   Pulse 74   Temp 98.1 F (36.7 C) (Oral)   Resp (!) 24   Ht 5\' 10"  (1.778 m)   Wt 56.7 kg   SpO2 100%   BMI  17.94 kg/m  Physical Exam Vitals and nursing note reviewed.  Constitutional:      General: He is not in acute distress.    Appearance: He is well-developed.  HENT:     Head: Normocephalic and atraumatic.     Right Ear: External ear normal.     Left Ear: External ear normal.     Nose: Nose normal.  Eyes:     Extraocular Movements: Extraocular movements intact.     Conjunctiva/sclera: Conjunctivae normal.     Pupils: Pupils are equal, round, and reactive to light.  Pulmonary:     Effort: Pulmonary effort is normal. No respiratory distress.  Abdominal:     General: There is no distension.     Palpations: Abdomen is soft. There is no  mass.     Tenderness: There is no abdominal tenderness (Negative Murphy sign). There is right CVA tenderness. There is no left CVA tenderness or guarding.  Musculoskeletal:     Cervical back: Normal range of motion and neck supple.     Right lower leg: No edema.     Left lower leg: No edema.  Skin:    General: Skin is warm and dry.  Neurological:     Mental Status: He is alert. Mental status is at baseline.  Psychiatric:        Mood and Affect: Mood normal.        Behavior: Behavior normal.     ED Results / Procedures / Treatments   Labs (all labs ordered are listed, but only abnormal results are displayed) Labs Reviewed  COMPREHENSIVE METABOLIC PANEL WITH GFR - Abnormal; Notable for the following components:      Result Value   Glucose, Bld 122 (*)    Total Protein 6.4 (*)    All other components within normal limits  CBC WITH DIFFERENTIAL/PLATELET - Abnormal; Notable for the following components:   WBC 13.1 (*)    RBC 3.13 (*)    Hemoglobin 11.1 (*)    HCT 34.6 (*)    MCV 110.5 (*)    MCH 35.5 (*)    Platelets 404 (*)    Neutro Abs 12.0 (*)    Lymphs Abs 0.3 (*)    All other components within normal limits  URINALYSIS, ROUTINE W REFLEX MICROSCOPIC - Abnormal; Notable for the following components:   Color, Urine AMBER (*)    APPearance HAZY (*)    Hgb urine dipstick MODERATE (*)    Protein, ur 30 (*)    Bacteria, UA RARE (*)    All other components within normal limits  LIPASE, BLOOD    EKG None  Radiology CT Renal Stone Study Result Date: 01/19/2024 CLINICAL DATA:  Abdominal/flank pain, stone suspected R flank. EXAM: CT ABDOMEN AND PELVIS WITHOUT CONTRAST TECHNIQUE: Multidetector CT imaging of the abdomen and pelvis was performed following the standard protocol without IV contrast. RADIATION DOSE REDUCTION: This exam was performed according to the departmental dose-optimization program which includes automated exposure control, adjustment of the mA and/or kV  according to patient size and/or use of iterative reconstruction technique. COMPARISON:  CT scan renal stone protocol from 07/30/2022. FINDINGS: Lower chest: The lung bases are clear. No pleural effusion. The heart is normal in size. No pericardial effusion. Hepatobiliary: The liver is normal in size. Non-cirrhotic configuration. No suspicious mass. No intrahepatic or extrahepatic bile duct dilation. Small volume tiny calcified gallstones noted without imaging signs of acute cholecystitis. Normal gallbladder wall thickness. No pericholecystic inflammatory changes. Pancreas: Unremarkable.  No pancreatic ductal dilatation or surrounding inflammatory changes. Spleen: Within normal limits. No focal lesion. Adrenals/Urinary Tract: Adrenal glands are unremarkable. No suspicious renal mass within the limitations of this unenhanced exam. No left nephroureterolithiasis or obstructive uropathy. There is a 5 x 6 mm calculus in the right upper ureter causing moderate proximal hydronephrosis and hydroureter. There is mild right perinephric and periureteric fat stranding. Right ureter distal to the calculus is unremarkable. Urinary bladder is under distended, precluding optimal assessment. However, no large mass or stones identified. No perivesical fat stranding. Stomach/Bowel: No disproportionate dilation of the small or large bowel loops. No evidence of abnormal bowel wall thickening or inflammatory changes. The appendix was not visualized; however there is no acute inflammatory process in the right lower quadrant. Vascular/Lymphatic: Trace amount of fluid in the right paracolic gutter related to right obstructive uropathy. No pneumoperitoneum. No abdominal or pelvic lymphadenopathy, by size criteria. No aneurysmal dilation of the major abdominal arteries. There are mild peripheral atherosclerotic vascular calcifications of the aorta and its major branches. Reproductive: Normal size prostate. Symmetric seminal vesicles. Other:  The visualized soft tissues and abdominal wall are unremarkable. Musculoskeletal: No suspicious osseous lesions. There are mild multilevel degenerative changes in the visualized spine. IMPRESSION: 1. There is a 5 x 6 mm right upper ureteric calculus with resultant moderate obstructive uropathy. No nephrolithiasis on either side. No left obstructive uropathy. 2. Multiple other nonacute observations, as described above. Aortic Atherosclerosis (ICD10-I70.0). Electronically Signed   By: Beula Brunswick M.D.   On: 01/19/2024 14:08    Procedures Procedures    Medications Ordered in ED Medications  ondansetron (ZOFRAN) injection 4 mg (4 mg Intravenous Given 01/19/24 1209)  sodium chloride 0.9 % bolus 1,000 mL (0 mLs Intravenous Stopped 01/19/24 1308)  HYDROmorphone (DILAUDID) injection 0.5 mg (0.5 mg Intravenous Given 01/19/24 1209)    ED Course/ Medical Decision Making/ A&P                                 Medical Decision Making Amount and/or Complexity of Data Reviewed Labs: ordered. Radiology: ordered.  Risk Prescription drug management.   CULLEN LAHAIE is a 63 y.o. 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    Initial Ddx:  Nephrolithiasis, pyelonephritis, lumbar radiculopathy, AAA, cholecystitis  MDM:  Feel the patient likely has nephrolithiasis based on their symptoms.  Will obtain urinalysis to assess for pyelonephritis as well but feel this is less likely given the lack of systemic symptoms.  Given age will also assess for AAA with CT scan. No symptoms that would be suggestive of radiculopathy.  No right upper quadrant tenderness to palpation to suggest cholecystitis.  Plan:  Labs Urinalysis CT stone protocol Pain medication  ED Summary/Re-evaluation:   Patient found to have proximal right sided 6 mm kidney stone.  Urinalysis without signs of urinary tract infection.  No AKI on lab work.  Patient's pain was able to be controlled and they were given a urine strainer and instructions on how to use it.  Will have them follow-up with urology for additional evaluation.  Was sent home with instructions to take Tylenol, ibuprofen, Flomax, and oxycodone for any breakthrough pain.  This patient presents to the ED for concern of complaints listed in HPI, this  involves an extensive number of treatment options, and is a complaint that carries with it a high risk of complications and morbidity. Disposition including potential need for admission considered.   Dispo: DC Home. Return precautions discussed including, but not limited to, those listed in the AVS. Allowed pt time to ask questions which were answered fully prior to dc.  Additional history obtained from spouse Records reviewed Outpatient Clinic Notes The following labs were independently interpreted: Urinalysis and show  hematuria suggestive of kidney stone but no signs of infection I independently reviewed the following imaging with scope of interpretation limited to determining acute life threatening conditions related to emergency care: CT Abdomen/Pelvis and agree with the radiologist interpretation with the following exceptions: none I personally reviewed and interpreted cardiac monitoring: normal sinus rhythm  I personally reviewed and interpreted the pt's EKG: see above for interpretation  I have reviewed the patients home medications and made adjustments as needed   Final Clinical Impression(s) / ED Diagnoses Final diagnoses:  Right kidney stone  Nausea and vomiting, unspecified vomiting type    Rx / DC Orders ED Discharge Orders          Ordered    oxyCODONE (ROXICODONE) 5 MG immediate release tablet  Every 4 hours PRN        01/19/24 1424    ondansetron (ZOFRAN-ODT) 4 MG  disintegrating tablet  Every 8 hours PRN        01/19/24 1424    tamsulosin (FLOMAX) 0.4 MG CAPS capsule  Daily after breakfast        01/19/24 1451              Ninetta Basket, MD 01/19/24 1647

## 2024-01-19 NOTE — ED Notes (Signed)
 Pt has a history of kidney stones, reports that this feels the same as previous episodes. Denies nausea at this time. Endorses pain.

## 2024-01-19 NOTE — ED Triage Notes (Signed)
 Pt c/o right flank pain that started yesterday and vomiting that started this morning around 0800.

## 2024-01-19 NOTE — Discharge Instructions (Signed)
 You were seen for your kidney stone in the emergency department.    At home, please take Tylenol for your pain. You may also take the oxycodone we have prescribed you for any breakthrough pain that may have.  Do not take this before driving or operating heavy machinery.  Do not take this medication with alcohol.  Use the flowmax we have give you every day until the kidney stone passes. Please also strain your urine to collect the kidney stone. Store it in a container and take it to your urologist for analysis.   Follow-up with urology in a week to discuss your symptoms.   Return immediately to the emergency department if you experience any of the following: fever, unbearable pain, urinary retention, or any other concerning symptoms.    Thank you for visiting our Emergency Department. It was a pleasure taking care of you today.

## 2024-02-03 ENCOUNTER — Encounter: Payer: Self-pay | Admitting: Family

## 2024-02-03 ENCOUNTER — Ambulatory Visit (INDEPENDENT_AMBULATORY_CARE_PROVIDER_SITE_OTHER): Payer: Medicare Other | Admitting: Family

## 2024-02-03 VITALS — BP 108/69 | HR 75 | Temp 97.9°F | Ht 70.0 in | Wt 128.2 lb

## 2024-02-03 DIAGNOSIS — I6930 Unspecified sequelae of cerebral infarction: Secondary | ICD-10-CM

## 2024-02-03 DIAGNOSIS — K529 Noninfective gastroenteritis and colitis, unspecified: Secondary | ICD-10-CM

## 2024-02-03 DIAGNOSIS — K90829 Short bowel syndrome, unspecified: Secondary | ICD-10-CM | POA: Diagnosis not present

## 2024-02-03 DIAGNOSIS — F5101 Primary insomnia: Secondary | ICD-10-CM | POA: Diagnosis not present

## 2024-02-03 DIAGNOSIS — I10 Essential (primary) hypertension: Secondary | ICD-10-CM

## 2024-02-03 DIAGNOSIS — E785 Hyperlipidemia, unspecified: Secondary | ICD-10-CM

## 2024-02-03 DIAGNOSIS — E559 Vitamin D deficiency, unspecified: Secondary | ICD-10-CM

## 2024-02-03 DIAGNOSIS — D649 Anemia, unspecified: Secondary | ICD-10-CM

## 2024-02-03 MED ORDER — TRAZODONE HCL 100 MG PO TABS: 100.0000 mg | ORAL_TABLET | Freq: Every day | ORAL | 2 refills | Status: DC

## 2024-02-03 NOTE — Patient Instructions (Signed)
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
## 2024-02-04 LAB — CMP14+EGFR
ALT: 15 IU/L (ref 0–44)
AST: 22 IU/L (ref 0–40)
Albumin: 3.9 g/dL (ref 3.9–4.9)
Alkaline Phosphatase: 63 IU/L (ref 44–121)
BUN/Creatinine Ratio: 21 (ref 10–24)
BUN: 18 mg/dL (ref 8–27)
CO2: 25 mmol/L (ref 20–29)
Calcium: 9.1 mg/dL (ref 8.6–10.2)
Chloride: 103 mmol/L (ref 96–106)
Creatinine, Ser: 0.87 mg/dL (ref 0.76–1.27)
Globulin, Total: 1.8 g/dL (ref 1.5–4.5)
Glucose: 104 mg/dL — ABNORMAL HIGH (ref 70–99)
Potassium: 5 mmol/L (ref 3.5–5.2)
Sodium: 139 mmol/L (ref 134–144)
Total Protein: 5.7 g/dL — ABNORMAL LOW (ref 6.0–8.5)
eGFR: 97 mL/min/{1.73_m2} (ref >59)

## 2024-02-04 LAB — ANEMIA PROFILE B
Basophils Absolute: 0.1 10*3/uL (ref 0.0–0.2)
Basos: 1 %
EOS (ABSOLUTE): 0.2 10*3/uL (ref 0.0–0.4)
Eos: 5 %
Ferritin: 84 ng/mL (ref 30–400)
Hematocrit: 32.1 % — ABNORMAL LOW (ref 37.5–51.0)
Hemoglobin: 10.7 g/dL — ABNORMAL LOW (ref 13.0–17.7)
Immature Grans (Abs): 0 10*3/uL (ref 0.0–0.1)
Immature Granulocytes: 0 %
Iron Saturation: 7 % — CL (ref 15–55)
Iron: 25 ug/dL — ABNORMAL LOW (ref 38–169)
Lymphocytes Absolute: 0.7 10*3/uL (ref 0.7–3.1)
Lymphs: 17 %
MCH: 35.5 pg — ABNORMAL HIGH (ref 26.6–33.0)
MCHC: 33.3 g/dL (ref 31.5–35.7)
MCV: 107 fL — ABNORMAL HIGH (ref 79–97)
Monocytes Absolute: 0.5 10*3/uL (ref 0.1–0.9)
Monocytes: 12 %
Neutrophils Absolute: 2.8 10*3/uL (ref 1.4–7.0)
Neutrophils: 65 %
Platelets: 402 10*3/uL (ref 150–450)
RBC: 3.01 x10E6/uL — ABNORMAL LOW (ref 4.14–5.80)
RDW: 11.3 % — ABNORMAL LOW (ref 11.6–15.4)
Retic Ct Pct: 5.1 % — ABNORMAL HIGH (ref 0.6–2.6)
Total Iron Binding Capacity: 368 ug/dL (ref 250–450)
UIBC: 343 ug/dL (ref 111–343)
Vitamin B-12: 1495 pg/mL — ABNORMAL HIGH (ref 232–1245)
WBC: 4.3 10*3/uL (ref 3.4–10.8)

## 2024-02-13 ENCOUNTER — Other Ambulatory Visit: Payer: Self-pay

## 2024-02-13 ENCOUNTER — Inpatient Hospital Stay: Payer: Medicare Other | Attending: Hematology

## 2024-02-13 DIAGNOSIS — K922 Gastrointestinal hemorrhage, unspecified: Secondary | ICD-10-CM | POA: Diagnosis not present

## 2024-02-13 DIAGNOSIS — E559 Vitamin D deficiency, unspecified: Secondary | ICD-10-CM | POA: Diagnosis not present

## 2024-02-13 DIAGNOSIS — Z0000 Encounter for general adult medical examination without abnormal findings: Secondary | ICD-10-CM

## 2024-02-13 DIAGNOSIS — D500 Iron deficiency anemia secondary to blood loss (chronic): Secondary | ICD-10-CM | POA: Insufficient documentation

## 2024-02-13 LAB — CBC
HCT: 37.2 % — ABNORMAL LOW (ref 39.0–52.0)
Hemoglobin: 12.2 g/dL — ABNORMAL LOW (ref 13.0–17.0)
MCH: 36.1 pg — ABNORMAL HIGH (ref 26.0–34.0)
MCHC: 32.8 g/dL (ref 30.0–36.0)
MCV: 110.1 fL — ABNORMAL HIGH (ref 80.0–100.0)
Platelets: 375 10*3/uL (ref 150–400)
RBC: 3.38 MIL/uL — ABNORMAL LOW (ref 4.22–5.81)
RDW: 12.8 % (ref 11.5–15.5)
WBC: 4.4 10*3/uL (ref 4.0–10.5)
nRBC: 0 % (ref 0.0–0.2)

## 2024-02-13 LAB — FERRITIN: Ferritin: 34 ng/mL (ref 24–336)

## 2024-02-13 LAB — IRON AND TIBC
Iron: 149 ug/dL (ref 45–182)
Saturation Ratios: 35 % (ref 17.9–39.5)
TIBC: 430 ug/dL (ref 250–450)
UIBC: 281 ug/dL

## 2024-02-13 LAB — SAMPLE TO BLOOD BANK

## 2024-02-13 LAB — VITAMIN D 25 HYDROXY (VIT D DEFICIENCY, FRACTURES): Vit D, 25-Hydroxy: 67.68 ng/mL (ref 30–100)

## 2024-02-15 ENCOUNTER — Telehealth: Payer: Self-pay | Admitting: Gastroenterology

## 2024-02-15 NOTE — Telephone Encounter (Signed)
 Patient's visit with [facility] small bowel transplant clinic never happened last year, looks like scheduled later this year. His IDA has been stable, still sees hematology.   Loose end, when do you want his next flex sig, pathology letter said "to be determined".

## 2024-02-16 ENCOUNTER — Other Ambulatory Visit: Payer: Self-pay | Admitting: Family

## 2024-02-16 MED ORDER — FERROUS SULFATE 325 (65 FE) MG PO TBEC: 325.0000 mg | DELAYED_RELEASE_TABLET | Freq: Two times a day (BID) | ORAL | 1 refills | Status: DC
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
## 2024-02-17 ENCOUNTER — Inpatient Hospital Stay: Payer: Medicare Other | Admitting: Physician Assistant

## 2024-02-17 ENCOUNTER — Ambulatory Visit: Payer: Self-pay | Admitting: *Deleted

## 2024-02-17 ENCOUNTER — Telehealth: Payer: Self-pay
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
## 2024-02-17 NOTE — Telephone Encounter (Signed)
 Spoke to wife and answered all questions per dpr.

## 2024-02-17 NOTE — Telephone Encounter (Signed)
 Copied from CRM [id number]. Topic: Clinical - Lab/Test Results >> Feb 16, 2024  5:04 PM Stanly Early wrote: Reason for CRM: patient would like to discuss lab results [phone]

## 2024-02-19 ENCOUNTER — Inpatient Hospital Stay: Admitting: Physician Assistant

## 2024-02-19 DIAGNOSIS — D500 Iron deficiency anemia secondary to blood loss (chronic): Secondary | ICD-10-CM | POA: Diagnosis not present

## 2024-02-19 DIAGNOSIS — E559 Vitamin D deficiency, unspecified: Secondary | ICD-10-CM

## 2024-02-19 DIAGNOSIS — K909 Intestinal malabsorption, unspecified: Secondary | ICD-10-CM | POA: Diagnosis not present

## 2024-02-19 DIAGNOSIS — K922 Gastrointestinal hemorrhage, unspecified: Secondary | ICD-10-CM

## 2024-02-19 NOTE — Addendum Note (Signed)
 Addended by: Sheril Dines on: 02/19/2024 09:14 AM   Modules accepted: Orders

## 2024-04-22 ENCOUNTER — Ambulatory Visit

## 2024-04-22 VITALS — BP 108/69 | HR 75 | Ht 70.0 in | Wt 128.0 lb

## 2024-04-22 DIAGNOSIS — Z0000 Encounter for general adult medical examination without abnormal findings: Secondary | ICD-10-CM | POA: Diagnosis not present

## 2024-04-22 NOTE — Patient Instructions (Signed)
 [name] , Thank you for taking time out of your busy schedule to complete your Annual Wellness Visit with me. I enjoyed our conversation and look forward to speaking with you again next year. I, as well as your care team,  appreciate your ongoing commitment to your health goals. Please review the following plan we discussed and let me know if I can assist you in the future. Your Game plan/ To Do List    Follow up Visits: Next Medicare AWV with our clinical staff: 04/25/25 at 10:00a.m.    Next Office Visit with your provider: 08/10/24 at 10:10a.m.  Clinician Recommendations:  Aim for 30 minutes of exercise or brisk walking, 6-8 glasses of water , and 5 servings of fruits and vegetables each day.       This is a list of the screening recommended for you and due dates:  Health Maintenance  Topic Date Due   COVID-19 Vaccine (4 - 2024-25 season) 06/08/2023   Medicare Annual Wellness Visit  01/23/2024   Flu Shot  05/07/2024   DTaP/Tdap/Td vaccine (3 - Td or Tdap) 11/10/2030   Colon Cancer Screening  12/18/2032   Hepatitis C Screening  Completed   HIV Screening  Completed   Zoster (Shingles) Vaccine  Completed   Hepatitis B Vaccine  Aged Out   HPV Vaccine  Aged Out   Meningitis B Vaccine  Aged Out    Advanced directives: (Declined) Advance directive discussed with you today. Even though you declined this today, please call our office should you change your mind, and we can give you the proper paperwork for you to fill out. Advance Care Planning is important because it:  [x]  Makes sure you receive the medical care that is consistent with your values, goals, and preferences  [x]  It provides guidance to your family and loved ones and reduces their decisional burden about whether or not they are making the right decisions based on your wishes.  Follow the link provided in your after visit summary or read over the paperwork we have mailed to you to help you started getting your Advance  Directives in place. If you need assistance in completing these, please reach out to us  so that we can help you!  See attachments for Preventive Care and Fall Prevention Tips.
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
## 2024-05-24 ENCOUNTER — Inpatient Hospital Stay

## 2024-05-24 ENCOUNTER — Inpatient Hospital Stay: Attending: Hematology

## 2024-05-24 DIAGNOSIS — K922 Gastrointestinal hemorrhage, unspecified: Secondary | ICD-10-CM | POA: Diagnosis not present

## 2024-05-24 DIAGNOSIS — E559 Vitamin D deficiency, unspecified: Secondary | ICD-10-CM | POA: Insufficient documentation

## 2024-05-24 DIAGNOSIS — D500 Iron deficiency anemia secondary to blood loss (chronic): Secondary | ICD-10-CM | POA: Diagnosis not present

## 2024-05-24 LAB — IRON AND TIBC
Iron: 301 ug/dL — ABNORMAL HIGH (ref 45–182)
Saturation Ratios: 62 % — ABNORMAL HIGH (ref 17.9–39.5)
TIBC: 485 ug/dL — ABNORMAL HIGH (ref 250–450)
UIBC: 184 ug/dL

## 2024-05-24 LAB — CBC WITH DIFFERENTIAL/PLATELET
Abs Immature Granulocytes: 0.07 K/uL (ref 0.00–0.07)
Basophils Absolute: 0.1 K/uL (ref 0.0–0.1)
Basophils Relative: 1 %
Eosinophils Absolute: 0.2 K/uL (ref 0.0–0.5)
Eosinophils Relative: 4 %
HCT: 35.6 % — ABNORMAL LOW (ref 39.0–52.0)
Hemoglobin: 11.5 g/dL — ABNORMAL LOW (ref 13.0–17.0)
Immature Granulocytes: 1 %
Lymphocytes Relative: 11 %
Lymphs Abs: 0.7 K/uL (ref 0.7–4.0)
MCH: 34.7 pg — ABNORMAL HIGH (ref 26.0–34.0)
MCHC: 32.3 g/dL (ref 30.0–36.0)
MCV: 107.6 fL — ABNORMAL HIGH (ref 80.0–100.0)
Monocytes Absolute: 0.6 K/uL (ref 0.1–1.0)
Monocytes Relative: 9 %
Neutro Abs: 4.9 K/uL (ref 1.7–7.7)
Neutrophils Relative %: 74 %
Platelets: 372 K/uL (ref 150–400)
RBC: 3.31 MIL/uL — ABNORMAL LOW (ref 4.22–5.81)
RDW: 13 % (ref 11.5–15.5)
WBC: 6.6 K/uL (ref 4.0–10.5)
nRBC: 0 % (ref 0.0–0.2)

## 2024-05-24 LAB — FERRITIN: Ferritin: 23 ng/mL — ABNORMAL LOW (ref 24–336)

## 2024-05-24 LAB — RETICULOCYTES
Immature Retic Fract: 22.2 % — ABNORMAL HIGH (ref 2.3–15.9)
RBC.: 3.33 MIL/uL — ABNORMAL LOW (ref 4.22–5.81)
Retic Count, Absolute: 144.2 K/uL (ref 19.0–186.0)
Retic Ct Pct: 4.3 % — ABNORMAL HIGH (ref 0.4–3.1)

## 2024-05-24 LAB — VITAMIN B12: Vitamin B-12: 1430 pg/mL — ABNORMAL HIGH (ref 180–914)

## 2024-05-24 LAB — VITAMIN D 25 HYDROXY (VIT D DEFICIENCY, FRACTURES): Vit D, 25-Hydroxy: 49.76 ng/mL (ref 30–100)

## 2024-05-24 LAB — FOLATE: Folate: 34 ng/mL (ref >5.9)

## 2024-05-27 DIAGNOSIS — E46 Unspecified protein-calorie malnutrition: Secondary | ICD-10-CM | POA: Diagnosis not present

## 2024-05-27 DIAGNOSIS — Z9049 Acquired absence of other specified parts of digestive tract: Secondary | ICD-10-CM | POA: Diagnosis not present

## 2024-05-28 NOTE — Progress Notes (Signed)
 BRIEF NONBILLABLE NOTE: Patient was scheduled for telephone visit at 9:00 AM.  He was unable to be reached at that time, and requested to be contacted after 2:00 PM.  Nursing staff was able to contact him for preappointment questions at 2:13 PM, but as I was busy seeing other patients, I was not able to attempt to call him until after my afternoon clinic had finished.  From 3:45 PM to 4:45 PM, I attempted to contact the patient via telephone x 5, also sent MyChart message and left voicemail x 2.  No response.    We will attempt to reschedule patient for OFFICE visit on another day.  Pleasant CHRISTELLA Barefoot, PA-C 05/31/24 4:51 PM

## 2024-05-30 LAB — METHYLMALONIC ACID, SERUM: Methylmalonic Acid, Quantitative: 158 nmol/L (ref 0–378)

## 2024-05-31 ENCOUNTER — Encounter: Payer: Self-pay | Admitting: Physician Assistant

## 2024-05-31 ENCOUNTER — Inpatient Hospital Stay (HOSPITAL_BASED_OUTPATIENT_CLINIC_OR_DEPARTMENT_OTHER): Admitting: Physician Assistant

## 2024-05-31 DIAGNOSIS — Z5321 Procedure and treatment not carried out due to patient leaving prior to being seen by health care provider: Secondary | ICD-10-CM

## 2024-06-27 ENCOUNTER — Other Ambulatory Visit: Payer: Self-pay | Admitting: Family

## 2024-07-13 DIAGNOSIS — Z0189 Encounter for other specified special examinations: Secondary | ICD-10-CM | POA: Diagnosis not present

## 2024-07-13 DIAGNOSIS — Z9049 Acquired absence of other specified parts of digestive tract: Secondary | ICD-10-CM | POA: Diagnosis not present

## 2024-07-13 DIAGNOSIS — Z713 Dietary counseling and surveillance: Secondary | ICD-10-CM | POA: Diagnosis not present

## 2024-07-15 NOTE — Telephone Encounter (Signed)
 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 Bertis at [facility]. Noted under media tab. Does not believe he has short gut syndrome. Placed on alternating high dose lomotil /loperamide , seeing nutritionist. Goals of weight gain.   Please arrange for ov with Dr. Shaaron ONLY in six months.

## 2024-07-15 NOTE — Telephone Encounter (Signed)
 Please place recall for flex sig 12/2032 (needs ov first).

## 2024-07-29 ENCOUNTER — Other Ambulatory Visit: Payer: Self-pay | Admitting: Gastroenterology

## 2024-08-10 ENCOUNTER — Ambulatory Visit: Admitting: Family

## 2024-08-10 ENCOUNTER — Ambulatory Visit: Payer: Self-pay | Admitting: Family

## 2024-08-10 ENCOUNTER — Encounter: Payer: Self-pay | Admitting: Family

## 2024-08-10 VITALS — BP 106/66 | HR 81 | Temp 97.7°F | Ht 70.0 in | Wt 132.0 lb

## 2024-08-10 DIAGNOSIS — D509 Iron deficiency anemia, unspecified: Secondary | ICD-10-CM

## 2024-08-10 DIAGNOSIS — Z0000 Encounter for general adult medical examination without abnormal findings: Secondary | ICD-10-CM

## 2024-08-10 DIAGNOSIS — Z0001 Encounter for general adult medical examination with abnormal findings: Secondary | ICD-10-CM

## 2024-08-10 DIAGNOSIS — I6930 Unspecified sequelae of cerebral infarction: Secondary | ICD-10-CM

## 2024-08-10 DIAGNOSIS — E538 Deficiency of other specified B group vitamins: Secondary | ICD-10-CM

## 2024-08-10 DIAGNOSIS — K529 Noninfective gastroenteritis and colitis, unspecified: Secondary | ICD-10-CM | POA: Diagnosis not present

## 2024-08-10 DIAGNOSIS — F5101 Primary insomnia: Secondary | ICD-10-CM

## 2024-08-10 DIAGNOSIS — E559 Vitamin D deficiency, unspecified: Secondary | ICD-10-CM

## 2024-08-10 DIAGNOSIS — E785 Hyperlipidemia, unspecified: Secondary | ICD-10-CM

## 2024-08-10 DIAGNOSIS — K90829 Short bowel syndrome, unspecified: Secondary | ICD-10-CM

## 2024-08-10 MED ORDER — DIPHENOXYLATE-ATROPINE 2.5-0.025 MG PO TABS
2.0000 | ORAL_TABLET | Freq: Three times a day (TID) | ORAL | 5 refills | Status: AC | PRN
Start: 2024-08-10 — End: ?

## 2024-08-10 NOTE — Patient Instructions (Signed)
 Diarrhea, Adult Diarrhea is frequent loose and sometimes watery bowel movements. Diarrhea can make you feel weak and cause you to become dehydrated. Dehydration is a condition in which there is not enough water or other fluids in the body. Dehydration can make you tired and thirsty, cause you to have a dry mouth, and decrease how often you urinate. Diarrhea typically lasts 2-3 days. However, it can last longer if it is a sign of something more serious. It is important to treat your diarrhea as told by your health care provider. Follow these instructions at home: Eating and drinking     Follow these recommendations as told by your health care provider: Take an oral rehydration solution (ORS). This is an over-the-counter medicine that helps return your body to its normal balance of nutrients and water. It is found at pharmacies and retail stores. Drink enough fluid to keep your urine pale yellow. Drink fluids such as water, diluted fruit juice, and low-calorie sports drinks. You can drink milk also, if desired. Sucking on ice chips is another way to get fluids. Avoid drinking fluids that contain a lot of sugar or caffeine, such as soda, energy drinks, and regular sports drinks. Avoid alcohol. Eat bland, easy-to-digest foods in small amounts as you are able. These foods include bananas, applesauce, rice, lean meats, toast, and crackers. Avoid spicy or fatty foods.  Medicines Take over-the-counter and prescription medicines only as told by your health care provider. If you were prescribed antibiotics, take them as told by your health care provider. Do not stop using the antibiotic even if you start to feel better. General instructions  Wash your hands often using soap and water for at least 20 seconds. If soap and water are not available, use hand sanitizer. Others in the household should wash their hands as well. Hands should be washed: After using the toilet or changing a diaper. Before  preparing, cooking, or serving food. While caring for a sick person or while visiting someone in a hospital. Rest at home while you recover. Take a warm bath to relieve any burning or pain from frequent diarrhea episodes. Watch your condition for any changes. Contact a health care provider if: You have a fever. Your diarrhea gets worse. You have new symptoms. You vomit every time you eat or drink. You feel light-headed, dizzy, or have a headache. You have muscle cramps. You have signs of dehydration, such as: Dark urine, very little urine, or no urine. Cracked lips. Dry mouth. Sunken eyes. Sleepiness. Weakness. You have bloody or black stools or stools that look like tar. You have severe pain, cramping, or bloating in your abdomen. Your skin feels cold and clammy. You feel confused. Get help right away if: You have chest pain or your heart is beating very quickly. You have trouble breathing or you are breathing very quickly. You feel extremely weak or you faint. These symptoms may be an emergency. Get help right away. Call 911. Do not wait to see if the symptoms will go away. Do not drive yourself to the hospital. This information is not intended to replace advice given to you by your health care provider. Make sure you discuss any questions you have with your health care provider. Document Revised: 03/12/2022 Document Reviewed: 03/12/2022 Elsevier Patient Education  2024 [employer].
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
## 2024-08-11 LAB — ANEMIA PROFILE B
Basophils Absolute: 0.1 x10E3/uL (ref 0.0–0.2)
Basos: 1 %
EOS (ABSOLUTE): 0.2 x10E3/uL (ref 0.0–0.4)
Eos: 3 %
Ferritin: 54 ng/mL (ref 30–400)
Folate: >20 ng/mL (ref >3.0)
Hematocrit: 36.6 % — ABNORMAL LOW (ref 37.5–51.0)
Hemoglobin: 11.9 g/dL — ABNORMAL LOW (ref 13.0–17.7)
Immature Grans (Abs): 0 x10E3/uL (ref 0.0–0.1)
Immature Granulocytes: 0 %
Iron Saturation: 65 % — AB (ref 15–55)
Iron: 254 ug/dL — AB (ref 38–169)
Lymphocytes Absolute: 0.7 x10E3/uL (ref 0.7–3.1)
Lymphs: 13 %
MCH: 34.9 pg — ABNORMAL HIGH (ref 26.6–33.0)
MCHC: 32.5 g/dL (ref 31.5–35.7)
MCV: 107 fL — ABNORMAL HIGH (ref 79–97)
Monocytes Absolute: 0.7 x10E3/uL (ref 0.1–0.9)
Monocytes: 13 %
Neutrophils Absolute: 3.8 x10E3/uL (ref 1.4–7.0)
Neutrophils: 70 %
Platelets: 383 x10E3/uL (ref 150–450)
RBC: 3.41 x10E6/uL — ABNORMAL LOW (ref 4.14–5.80)
RDW: 10.8 % — ABNORMAL LOW (ref 11.6–15.4)
Retic Ct Pct: 3.7 % — ABNORMAL HIGH (ref 0.6–2.6)
Total Iron Binding Capacity: 391 ug/dL (ref 250–450)
UIBC: 137 ug/dL (ref 111–343)
Vitamin B-12: 747 pg/mL (ref 232–1245)
WBC: 5.4 x10E3/uL (ref 3.4–10.8)

## 2024-08-11 LAB — CMP14+EGFR
ALT: 17 IU/L (ref 0–44)
AST: 21 IU/L (ref 0–40)
Albumin: 4.2 g/dL (ref 3.9–4.9)
Alkaline Phosphatase: 68 IU/L (ref 47–123)
BUN/Creatinine Ratio: 20 (ref 10–24)
BUN: 15 mg/dL (ref 8–27)
Bilirubin Total: <0.2 mg/dL (ref 0.0–1.2)
CO2: 23 mmol/L (ref 20–29)
Calcium: 9.3 mg/dL (ref 8.6–10.2)
Chloride: 104 mmol/L (ref 96–106)
Creatinine, Ser: 0.75 mg/dL — AB (ref 0.76–1.27)
Globulin, Total: 2.4 g/dL (ref 1.5–4.5)
Glucose: 98 mg/dL (ref 70–99)
Potassium: 4.5 mmol/L (ref 3.5–5.2)
Sodium: 141 mmol/L (ref 134–144)
Total Protein: 6.6 g/dL (ref 6.0–8.5)
eGFR: 101 mL/min/1.73 (ref >59)

## 2024-08-11 LAB — VITAMIN D 25 HYDROXY (VIT D DEFICIENCY, FRACTURES): Vit D, 25-Hydroxy: 28.6 ng/mL — AB (ref 30.0–100.0)

## 2024-08-11 LAB — TSH: TSH: 1.48 u[IU]/mL (ref 0.450–4.500)

## 2024-08-12 ENCOUNTER — Ambulatory Visit: Payer: Self-pay | Admitting: Family

## 2024-08-12 MED ORDER — VITAMIN D (ERGOCALCIFEROL) 1.25 MG (50000 UNIT) PO CAPS: 50000.0000 [IU] | ORAL_CAPSULE | ORAL | 1 refills | Status: AC

## 2024-10-07 ENCOUNTER — Encounter: Payer: Self-pay | Admitting: Gastroenterology

## 2024-10-15 ENCOUNTER — Other Ambulatory Visit: Payer: Self-pay | Admitting: Family

## 2024-10-15 DIAGNOSIS — F5101 Primary insomnia: Secondary | ICD-10-CM

## 2024-10-21 ENCOUNTER — Telehealth: Payer: Self-pay | Admitting: Family

## 2024-10-21 DIAGNOSIS — L989 Disorder of the skin and subcutaneous tissue, unspecified: Secondary | ICD-10-CM

## 2024-10-21 NOTE — Telephone Encounter (Signed)
 Referral placed,

## 2024-10-21 NOTE — Telephone Encounter (Signed)
 Copied from CRM [id number]. Topic: Referral - Question >> Oct 21, 2024 10:11 AM DeAngela L wrote: Reason for CRM: Joy with [employer] calling for the patient to ask if the provider can give the patient a referral for a Dermatologist and also a Prior Auth?  Patient already has an appointment scheduled just need a new referral for that appointment on 10/26/2024 10am  Dermatologist  Dr JAYSON Brass  [address] [place] [address] phone [phone]

## 2025-02-10 ENCOUNTER — Ambulatory Visit: Admitting: Family

## 2025-04-25 ENCOUNTER — Ambulatory Visit: Payer: Self-pay
